# Patient Record
Sex: Male | Born: 1945 | Race: White | Hispanic: No | Marital: Married | Smoking: Never smoker
Health system: Southern US, Community
[De-identification: ages and names within clinical notes are randomized; demographics above are authoritative.]

## PROBLEM LIST (undated history)

## (undated) DIAGNOSIS — R519 Headache, unspecified: Secondary | ICD-10-CM

## (undated) DIAGNOSIS — R0600 Dyspnea, unspecified: Secondary | ICD-10-CM

## (undated) DIAGNOSIS — J189 Pneumonia, unspecified organism: Secondary | ICD-10-CM

## (undated) DIAGNOSIS — J45909 Unspecified asthma, uncomplicated: Secondary | ICD-10-CM

## (undated) DIAGNOSIS — E785 Hyperlipidemia, unspecified: Secondary | ICD-10-CM

## (undated) DIAGNOSIS — R51 Headache: Secondary | ICD-10-CM

## (undated) DIAGNOSIS — I10 Essential (primary) hypertension: Secondary | ICD-10-CM

## (undated) DIAGNOSIS — I219 Acute myocardial infarction, unspecified: Secondary | ICD-10-CM

## (undated) DIAGNOSIS — I2510 Atherosclerotic heart disease of native coronary artery without angina pectoris: Secondary | ICD-10-CM

## (undated) DIAGNOSIS — Z87442 Personal history of urinary calculi: Secondary | ICD-10-CM

## (undated) DIAGNOSIS — C801 Malignant (primary) neoplasm, unspecified: Secondary | ICD-10-CM

## (undated) DIAGNOSIS — T7840XA Allergy, unspecified, initial encounter: Secondary | ICD-10-CM

## (undated) DIAGNOSIS — M1990 Unspecified osteoarthritis, unspecified site: Secondary | ICD-10-CM

## (undated) DIAGNOSIS — G4730 Sleep apnea, unspecified: Secondary | ICD-10-CM

## (undated) DIAGNOSIS — G709 Myoneural disorder, unspecified: Secondary | ICD-10-CM

## (undated) DIAGNOSIS — J449 Chronic obstructive pulmonary disease, unspecified: Secondary | ICD-10-CM

## (undated) DIAGNOSIS — K219 Gastro-esophageal reflux disease without esophagitis: Secondary | ICD-10-CM

## (undated) DIAGNOSIS — N200 Calculus of kidney: Secondary | ICD-10-CM

## (undated) HISTORY — DX: Unspecified asthma, uncomplicated: J45.909

## (undated) HISTORY — PX: RECTAL SURGERY: SHX760

## (undated) HISTORY — PX: REPLACEMENT TOTAL KNEE: SUR1224

## (undated) HISTORY — DX: Gastro-esophageal reflux disease without esophagitis: K21.9

## (undated) HISTORY — PX: EYE SURGERY: SHX253

## (undated) HISTORY — DX: Essential (primary) hypertension: I10

## (undated) HISTORY — DX: Hyperlipidemia, unspecified: E78.5

## (undated) HISTORY — PX: COLONOSCOPY: SHX174

## (undated) HISTORY — DX: Allergy, unspecified, initial encounter: T78.40XA

## (undated) HISTORY — PX: CARPAL TUNNEL RELEASE: SHX101

## (undated) HISTORY — PX: TONSILLECTOMY: SUR1361

## (undated) HISTORY — DX: Sleep apnea, unspecified: G47.30

## (undated) HISTORY — DX: Calculus of kidney: N20.0

## (undated) HISTORY — DX: Unspecified osteoarthritis, unspecified site: M19.90

---
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
## 1999-02-15 ENCOUNTER — Emergency Department (HOSPITAL_COMMUNITY): Admission: EM | Admit: 1999-02-15 | Discharge: 1999-02-15 | Payer: Self-pay | Admitting: Emergency Medicine

## 1999-02-15 ENCOUNTER — Encounter: Payer: Self-pay | Admitting: Emergency Medicine

## 1999-09-30 ENCOUNTER — Emergency Department (HOSPITAL_COMMUNITY): Admission: EM | Admit: 1999-09-30 | Discharge: 1999-09-30 | Payer: Self-pay | Admitting: Emergency Medicine

## 1999-10-23 ENCOUNTER — Encounter: Payer: Self-pay | Admitting: Family Medicine

## 1999-10-23 ENCOUNTER — Encounter: Admission: RE | Admit: 1999-10-23 | Discharge: 1999-10-23 | Payer: Self-pay | Admitting: Family Medicine

## 1999-11-11 ENCOUNTER — Encounter: Admission: RE | Admit: 1999-11-11 | Discharge: 1999-11-11 | Payer: Self-pay | Admitting: Orthopedic Surgery

## 1999-11-11 ENCOUNTER — Encounter: Payer: Self-pay | Admitting: Orthopedic Surgery

## 2001-12-26 ENCOUNTER — Encounter: Payer: Self-pay | Admitting: Emergency Medicine

## 2001-12-26 ENCOUNTER — Emergency Department (HOSPITAL_COMMUNITY): Admission: EM | Admit: 2001-12-26 | Discharge: 2001-12-27 | Payer: Self-pay | Admitting: Emergency Medicine

## 2001-12-27 ENCOUNTER — Encounter: Payer: Self-pay | Admitting: Emergency Medicine

## 2002-05-29 ENCOUNTER — Encounter: Admission: RE | Admit: 2002-05-29 | Discharge: 2002-05-29 | Payer: Self-pay | Admitting: Orthopedic Surgery

## 2002-05-29 ENCOUNTER — Encounter: Payer: Self-pay | Admitting: Orthopedic Surgery

## 2003-06-19 ENCOUNTER — Encounter: Payer: Self-pay | Admitting: Emergency Medicine

## 2003-06-19 ENCOUNTER — Emergency Department (HOSPITAL_COMMUNITY): Admission: EM | Admit: 2003-06-19 | Discharge: 2003-06-19 | Payer: Self-pay | Admitting: Emergency Medicine

## 2004-08-12 ENCOUNTER — Encounter: Admission: RE | Admit: 2004-08-12 | Discharge: 2004-08-12 | Payer: Self-pay | Admitting: Orthopedic Surgery

## 2004-08-22 ENCOUNTER — Ambulatory Visit (HOSPITAL_COMMUNITY): Admission: RE | Admit: 2004-08-22 | Discharge: 2004-08-22 | Payer: Self-pay | Admitting: Orthopedic Surgery

## 2005-12-10 ENCOUNTER — Encounter: Admission: RE | Admit: 2005-12-10 | Discharge: 2005-12-10 | Payer: Self-pay

## 2006-05-01 ENCOUNTER — Inpatient Hospital Stay (HOSPITAL_COMMUNITY): Admission: EM | Admit: 2006-05-01 | Discharge: 2006-05-04 | Payer: Self-pay | Admitting: *Deleted

## 2007-01-10 ENCOUNTER — Ambulatory Visit (HOSPITAL_BASED_OUTPATIENT_CLINIC_OR_DEPARTMENT_OTHER): Admission: RE | Admit: 2007-01-10 | Discharge: 2007-01-10 | Payer: Self-pay | Admitting: Orthopedic Surgery

## 2012-09-04 DIAGNOSIS — K219 Gastro-esophageal reflux disease without esophagitis: Secondary | ICD-10-CM | POA: Insufficient documentation

## 2013-03-02 DIAGNOSIS — E039 Hypothyroidism, unspecified: Secondary | ICD-10-CM | POA: Insufficient documentation

## 2014-02-04 DIAGNOSIS — G4733 Obstructive sleep apnea (adult) (pediatric): Secondary | ICD-10-CM | POA: Insufficient documentation

## 2014-04-01 ENCOUNTER — Encounter: Payer: Self-pay | Admitting: Gastroenterology

## 2014-10-12 DIAGNOSIS — Z79899 Other long term (current) drug therapy: Secondary | ICD-10-CM | POA: Insufficient documentation

## 2014-10-12 DIAGNOSIS — I10 Essential (primary) hypertension: Secondary | ICD-10-CM | POA: Insufficient documentation

## 2014-10-12 DIAGNOSIS — R7301 Impaired fasting glucose: Secondary | ICD-10-CM | POA: Insufficient documentation

## 2014-10-12 DIAGNOSIS — N401 Enlarged prostate with lower urinary tract symptoms: Secondary | ICD-10-CM | POA: Insufficient documentation

## 2014-10-12 DIAGNOSIS — Z77090 Contact with and (suspected) exposure to asbestos: Secondary | ICD-10-CM | POA: Insufficient documentation

## 2014-11-08 ENCOUNTER — Encounter: Payer: Self-pay | Admitting: Gastroenterology

## 2014-12-10 ENCOUNTER — Encounter: Payer: Self-pay | Admitting: Gastroenterology

## 2015-01-28 ENCOUNTER — Ambulatory Visit (AMBULATORY_SURGERY_CENTER): Payer: Self-pay | Admitting: *Deleted

## 2015-01-28 VITALS — Ht 74.0 in | Wt 327.0 lb

## 2015-01-28 DIAGNOSIS — Z1211 Encounter for screening for malignant neoplasm of colon: Secondary | ICD-10-CM

## 2015-01-28 MED ORDER — MOVIPREP 100 G PO SOLR: 1.0000 | Freq: Once | ORAL | Status: DC

## 2015-01-28 NOTE — Progress Notes (Signed)
No egg or soy allergy. No anesthesia problems.  No home O2.  No diet meds.  

## 2015-02-03 ENCOUNTER — Telehealth: Payer: Self-pay | Admitting: Gastroenterology

## 2015-02-04 NOTE — Telephone Encounter (Signed)
Patient states his Movi prep is over 100 dollars and cannot afford it. Patient state that [employer] told him that they would pay for prescription if we did a PA. Told patient that it is not covered because he has not tried any alternative preps in the past. The forms asks if he has tried and failed other preps. Told patient we can switch him to another prep with Miralax or I do have a 10 dollar off voucher for Movi prep. Patient states he would rather be switched to another prep. Told him he will need new prep instructions and I can mail them to his home. Pt agreed.

## 2015-02-11 ENCOUNTER — Encounter: Payer: Self-pay | Admitting: Gastroenterology

## 2015-02-11 ENCOUNTER — Ambulatory Visit (AMBULATORY_SURGERY_CENTER): Payer: Medicare Other | Admitting: Gastroenterology

## 2015-02-11 VITALS — BP 126/70 | HR 73 | Temp 98.5°F | Resp 24 | Ht 74.0 in | Wt 327.0 lb

## 2015-02-11 DIAGNOSIS — Z1211 Encounter for screening for malignant neoplasm of colon: Secondary | ICD-10-CM

## 2015-02-11 MED ORDER — SODIUM CHLORIDE 0.9 % IV SOLN: 500.0000 mL | INTRAVENOUS | Status: DC

## 2015-02-11 NOTE — Patient Instructions (Signed)

## 2015-02-11 NOTE — Op Note (Signed)
[facility]  [address]. [place], [address]   COLONOSCOPY PROCEDURE REPORT  PATIENT: Brad Woods, Brad Woods  MR#: [id number] BIRTHDATE: 19-Nov-1946 , 50  yrs. old GENDER: male ENDOSCOPIST: Ladene Artist, MD, [facility] PROCEDURE DATE:  02/11/2015 PROCEDURE:   Colonoscopy, screening First Screening Colonoscopy - Avg.  risk and is 50 yrs.  old or older - No.  Prior Negative Screening - Now for repeat screening. N/A  History of Adenoma - Now for follow-up colonoscopy & has been > or = to 3 yrs.  N/A  Polyps Removed Today? No.  Polyps Removed Today? No.  Recommend repeat exam, <10 yrs? Polyps Removed Today? No.  Recommend repeat exam, <10 yrs? No. ASA CLASS:   Class III INDICATIONS:average risk patient for colorectal cancer. MEDICATIONS: Monitored anesthesia care, Propofol 200 mg IV, and lidocaine 40 mg IV DESCRIPTION OF PROCEDURE:   After the risks benefits and alternatives of the procedure were thoroughly explained, informed consent was obtained.  The digital rectal exam revealed no abnormalities of the rectum.   The LB [id number]  endoscope was introduced through the anus and advanced to the cecum, which was identified by both the appendix and ileocecal valve. No adverse events experienced.   The quality of the prep was excellent, using MoviPrep  The instrument was then slowly withdrawn as the colon was fully examined.    COLON FINDINGS: There was moderate diverticulosis noted in the sigmoid colon and descending colon.   The examination was otherwise normal.  Retroflexed views revealed no abnormalities. The time to cecum=2 minutes 32 seconds.  Withdrawal time=11 minutes 17 seconds. The scope was withdrawn and the procedure completed. COMPLICATIONS: There were no immediate complications.  ENDOSCOPIC IMPRESSION: 1.   Moderate diverticulosis in the sigmoid colon and descending colon 2.   The examination was otherwise normal  RECOMMENDATIONS: 1.  High fiber  diet with liberal fluid intake. 2.  You should continue to follow colorectal cancer screening guidelines for "routine risk" patients with a repeat colonoscopy in 10 years.  There is no need for routine, screening FOBT (stool) testing for at least 5 years.  eSigned:  Ladene Artist, MD, [facility] 02/11/2015 11:37 AM   cc: Joneen Boers, MD

## 2015-02-11 NOTE — Progress Notes (Signed)
Awake stable to RR 

## 2015-02-14 ENCOUNTER — Telehealth: Payer: Self-pay | Admitting: *Deleted

## 2015-02-14 NOTE — Telephone Encounter (Signed)
No identifier, left message, follow-up  

## 2016-05-01 DIAGNOSIS — L578 Other skin changes due to chronic exposure to nonionizing radiation: Secondary | ICD-10-CM | POA: Diagnosis not present

## 2016-05-01 DIAGNOSIS — L570 Actinic keratosis: Secondary | ICD-10-CM | POA: Diagnosis not present

## 2016-05-01 DIAGNOSIS — L304 Erythema intertrigo: Secondary | ICD-10-CM | POA: Diagnosis not present

## 2016-05-18 ENCOUNTER — Inpatient Hospital Stay (HOSPITAL_BASED_OUTPATIENT_CLINIC_OR_DEPARTMENT_OTHER)
Admission: EM | Admit: 2016-05-18 | Discharge: 2016-05-22 | DRG: 247 | Disposition: A | Discharge status: Home / Self Care | Payer: PPO | Attending: Cardiovascular Disease | Admitting: Cardiovascular Disease

## 2016-05-18 ENCOUNTER — Encounter (HOSPITAL_BASED_OUTPATIENT_CLINIC_OR_DEPARTMENT_OTHER): Payer: Self-pay

## 2016-05-18 ENCOUNTER — Emergency Department (HOSPITAL_BASED_OUTPATIENT_CLINIC_OR_DEPARTMENT_OTHER): Payer: PPO

## 2016-05-18 DIAGNOSIS — I214 Non-ST elevation (NSTEMI) myocardial infarction: Secondary | ICD-10-CM | POA: Diagnosis not present

## 2016-05-18 DIAGNOSIS — I25110 Atherosclerotic heart disease of native coronary artery with unstable angina pectoris: Secondary | ICD-10-CM | POA: Diagnosis present

## 2016-05-18 DIAGNOSIS — I4510 Unspecified right bundle-branch block: Secondary | ICD-10-CM | POA: Diagnosis present

## 2016-05-18 DIAGNOSIS — I249 Acute ischemic heart disease, unspecified: Secondary | ICD-10-CM | POA: Diagnosis not present

## 2016-05-18 DIAGNOSIS — I10 Essential (primary) hypertension: Secondary | ICD-10-CM | POA: Diagnosis present

## 2016-05-18 DIAGNOSIS — Z885 Allergy status to narcotic agent status: Secondary | ICD-10-CM | POA: Diagnosis not present

## 2016-05-18 DIAGNOSIS — R739 Hyperglycemia, unspecified: Secondary | ICD-10-CM | POA: Diagnosis not present

## 2016-05-18 DIAGNOSIS — Z7951 Long term (current) use of inhaled steroids: Secondary | ICD-10-CM | POA: Diagnosis not present

## 2016-05-18 DIAGNOSIS — G4730 Sleep apnea, unspecified: Secondary | ICD-10-CM | POA: Diagnosis present

## 2016-05-18 DIAGNOSIS — I200 Unstable angina: Secondary | ICD-10-CM | POA: Diagnosis not present

## 2016-05-18 DIAGNOSIS — R778 Other specified abnormalities of plasma proteins: Secondary | ICD-10-CM

## 2016-05-18 DIAGNOSIS — Z96653 Presence of artificial knee joint, bilateral: Secondary | ICD-10-CM | POA: Diagnosis present

## 2016-05-18 DIAGNOSIS — E785 Hyperlipidemia, unspecified: Secondary | ICD-10-CM | POA: Diagnosis not present

## 2016-05-18 DIAGNOSIS — E663 Overweight: Secondary | ICD-10-CM | POA: Diagnosis not present

## 2016-05-18 DIAGNOSIS — Z6841 Body Mass Index (BMI) 40.0 and over, adult: Secondary | ICD-10-CM

## 2016-05-18 DIAGNOSIS — R079 Chest pain, unspecified: Secondary | ICD-10-CM | POA: Diagnosis not present

## 2016-05-18 DIAGNOSIS — Z955 Presence of coronary angioplasty implant and graft: Secondary | ICD-10-CM

## 2016-05-18 DIAGNOSIS — I2510 Atherosclerotic heart disease of native coronary artery without angina pectoris: Secondary | ICD-10-CM | POA: Diagnosis not present

## 2016-05-18 DIAGNOSIS — R7989 Other specified abnormal findings of blood chemistry: Secondary | ICD-10-CM | POA: Insufficient documentation

## 2016-05-18 LAB — CBC
HEMATOCRIT: 46.4 % (ref 39.0–52.0)
HEMOGLOBIN: 16.3 g/dL (ref 13.0–17.0)
MCH: 30.2 pg (ref 26.0–34.0)
MCHC: 35.1 g/dL (ref 30.0–36.0)
MCV: 86.1 fL (ref 78.0–100.0)
Platelets: 168 10*3/uL (ref 150–400)
RBC: 5.39 MIL/uL (ref 4.22–5.81)
RDW: 14.3 % (ref 11.5–15.5)
WBC: 7 10*3/uL (ref 4.0–10.5)

## 2016-05-18 LAB — BASIC METABOLIC PANEL
ANION GAP: 8 (ref 5–15)
BUN: 22 mg/dL — ABNORMAL HIGH (ref 6–20)
CHLORIDE: 101 mmol/L (ref 101–111)
CO2: 25 mmol/L (ref 22–32)
Calcium: 9.1 mg/dL (ref 8.9–10.3)
Creatinine, Ser: 0.82 mg/dL (ref 0.61–1.24)
GFR calc non Af Amer: >60 mL/min (ref >60)
GLUCOSE: 99 mg/dL (ref 65–99)
Potassium: 4.1 mmol/L (ref 3.5–5.1)
Sodium: 134 mmol/L — ABNORMAL LOW (ref 135–145)

## 2016-05-18 LAB — PROTIME-INR
INR: 1.03 (ref 0.00–1.49)
Prothrombin Time: 13.7 seconds (ref 11.6–15.2)

## 2016-05-18 LAB — TROPONIN I
Troponin I: 0.09 ng/mL — ABNORMAL HIGH (ref <0.031)
Troponin I: 0.34 ng/mL — ABNORMAL HIGH (ref <0.031)

## 2016-05-18 LAB — APTT: APTT: 38 s — AB (ref 24–37)

## 2016-05-18 LAB — MAGNESIUM: Magnesium: 2.3 mg/dL (ref 1.7–2.4)

## 2016-05-18 MED ORDER — ONDANSETRON HCL 4 MG/2ML IJ SOLN: 4.0000 mg | Freq: Four times a day (QID) | INTRAMUSCULAR | Status: DC | PRN

## 2016-05-18 MED ORDER — HEPARIN (PORCINE) IN NACL 100-0.45 UNIT/ML-% IJ SOLN
1900.0000 [IU]/h | INTRAMUSCULAR | Status: DC
  Administered 2016-05-18 (×2): 1500 [IU]/h via INTRAVENOUS
  Administered 2016-05-19: 1600 [IU]/h via INTRAVENOUS
  Filled 2016-05-18 (×3): qty 250

## 2016-05-18 MED ORDER — HEPARIN BOLUS VIA INFUSION
4000.0000 [IU] | Freq: Once | INTRAVENOUS | Status: AC
  Administered 2016-05-18: 4000 [IU] via INTRAVENOUS

## 2016-05-18 MED ORDER — GI COCKTAIL ~~LOC~~
30.0000 mL | Freq: Once | ORAL | Status: AC
  Administered 2016-05-18: 30 mL via ORAL
  Filled 2016-05-18: qty 30

## 2016-05-18 MED ORDER — ACETAMINOPHEN 325 MG PO TABS
650.0000 mg | ORAL_TABLET | ORAL | Status: DC | PRN
  Administered 2016-05-19 (×2): 650 mg via ORAL
  Filled 2016-05-18 (×2): qty 2

## 2016-05-18 MED ORDER — ASPIRIN EC 81 MG PO TBEC
81.0000 mg | DELAYED_RELEASE_TABLET | Freq: Every day | ORAL | Status: DC
  Administered 2016-05-19 – 2016-05-22 (×5): 81 mg via ORAL
  Filled 2016-05-18 (×5): qty 1

## 2016-05-18 MED ORDER — NITROGLYCERIN 2 % TD OINT
1.0000 [in_us] | TOPICAL_OINTMENT | Freq: Once | TRANSDERMAL | Status: AC
  Administered 2016-05-18: 1 [in_us] via TOPICAL
  Filled 2016-05-18: qty 1

## 2016-05-18 MED ORDER — ASPIRIN 81 MG PO CHEW
324.0000 mg | CHEWABLE_TABLET | Freq: Once | ORAL | Status: AC
Start: 2016-05-18 — End: 2016-05-18
  Administered 2016-05-18: 324 mg via ORAL
  Filled 2016-05-18: qty 4

## 2016-05-18 MED ORDER — NITROGLYCERIN 0.4 MG SL SUBL
0.4000 mg | SUBLINGUAL_TABLET | SUBLINGUAL | Status: DC | PRN
Start: 2016-05-18 — End: 2016-05-22
  Administered 2016-05-18: 0.4 mg via SUBLINGUAL
  Filled 2016-05-18 (×2): qty 1

## 2016-05-18 NOTE — ED Notes (Signed)
Pt has seen cardiology group about five to six years ago. Pt is planing to travel to [place] in three weeks.

## 2016-05-18 NOTE — H&P (Signed)
Referring Physician:  [name] is an 70 y.o. male.                       Chief Complaint: Chest pain  HPI: 70 y.o. male who presents to the Emergency Department complaining of intermittent chest pain that radiates to the left side of his neck and jaw onset last night. He states that he is having associated symptoms of indigestion, and chest pain. He states that he has tried ASA around 8am with no relief for his symptoms. Pt mentions he's had similar symptoms a year ago while he was in [place]. Pt mentions he's been belching frequently without relief of his chest pain.   Past Medical History  Diagnosis Date  . Allergy   . Arthritis   . Asthma     when younger  . GERD (gastroesophageal reflux disease)   . Sleep apnea     cpap  . Hyperlipidemia   . Hypertension   . Kidney stones       Past Surgical History  Procedure Laterality Date  . Colonoscopy    . Rectal surgery    . Eye surgery    . Replacement total knee Bilateral     Family History  Problem Relation Age of Onset  . Colon cancer Mother 29   Social History:  reports that he has never smoked. He has never used smokeless tobacco. He reports that he drinks alcohol. He reports that he does not use illicit drugs.  Allergies:  Allergies  Allergen Reactions  . Hydromorphone Hcl Other (See Comments)    "flatlined"    Medications Prior to Admission  Medication Sig Dispense Refill  . fluticasone (FLONASE) 50 MCG/ACT nasal spray Place 2 sprays into the nose.    . nitroGLYCERIN (NITROSTAT) 0.4 MG SL tablet Place 0.4 mg under the tongue.    . tamsulosin (FLOMAX) 0.4 MG CAPS capsule TAKE ONE CAPSULE BY MOUTH EVERY DAY      Results for orders placed or performed during the hospital encounter of 05/18/16 (from the past 48 hour(s))  Basic metabolic panel     Status: Abnormal   Collection Time: 05/18/16  5:45 PM  Result Value Ref Range   Sodium 134 (L) 135 - 145 mmol/L   Potassium 4.1 3.5 - 5.1 mmol/L   Chloride 101  101 - 111 mmol/L   CO2 25 22 - 32 mmol/L   Glucose, Bld 99 65 - 99 mg/dL   BUN 22 (H) 6 - 20 mg/dL   Creatinine, Ser 0.82 0.61 - 1.24 mg/dL   Calcium 9.1 8.9 - 10.3 mg/dL   GFR calc non Af Amer >60 >60 mL/min   GFR calc Af Amer >60 >60 mL/min    Comment: (NOTE) The eGFR has been calculated using the CKD EPI equation. This calculation has not been validated in all clinical situations. eGFR's persistently <60 mL/min signify possible Chronic Kidney Disease.    Anion gap 8 5 - 15  CBC     Status: None   Collection Time: 05/18/16  5:45 PM  Result Value Ref Range   WBC 7.0 4.0 - 10.5 K/uL   RBC 5.39 4.22 - 5.81 MIL/uL   Hemoglobin 16.3 13.0 - 17.0 g/dL   HCT 46.4 39.0 - 52.0 %   MCV 86.1 78.0 - 100.0 fL   MCH 30.2 26.0 - 34.0 pg   MCHC 35.1 30.0 - 36.0 g/dL   RDW 14.3 11.5 - 15.5 %  Platelets 168 150 - 400 K/uL  Troponin I     Status: Abnormal   Collection Time: 05/18/16  5:45 PM  Result Value Ref Range   Troponin I 0.09 (H) <0.031 ng/mL    Comment:        PERSISTENTLY INCREASED TROPONIN VALUES IN THE RANGE OF 0.04-0.49 ng/mL CAN BE SEEN IN:       -UNSTABLE ANGINA       -CONGESTIVE HEART FAILURE       -MYOCARDITIS       -CHEST TRAUMA       -ARRYHTHMIAS       -LATE PRESENTING MYOCARDIAL INFARCTION       -COPD   CLINICAL FOLLOW-UP RECOMMENDED.   Magnesium     Status: None   Collection Time: 05/18/16  5:45 PM  Result Value Ref Range   Magnesium 2.3 1.7 - 2.4 mg/dL  Protime-INR     Status: None   Collection Time: 05/18/16  5:45 PM  Result Value Ref Range   Prothrombin Time 13.7 11.6 - 15.2 seconds   INR 1.03 0.00 - 1.49  APTT     Status: Abnormal   Collection Time: 05/18/16  5:45 PM  Result Value Ref Range   aPTT 38 (H) 24 - 37 seconds    Comment:        IF BASELINE aPTT IS ELEVATED, SUGGEST PATIENT RISK ASSESSMENT BE USED TO DETERMINE APPROPRIATE ANTICOAGULANT THERAPY.    Dg Chest 2 View  05/18/2016  CLINICAL DATA:  Chest pain EXAM: CHEST  2 VIEW COMPARISON:   05/01/2006 FINDINGS: The heart size and mediastinal contours are within normal limits. Both lungs are clear. The visualized skeletal structures are unremarkable. IMPRESSION: No active cardiopulmonary disease. Electronically Signed   By: Inez Catalina M.D.   On: 05/18/2016 18:50    Review Of Systems Cardiovascular: Positive for chest pain.  Musculoskeletal: Positive for neck pain.  All other systems reviewed and are negative.  Blood pressure 136/78, pulse 72, temperature 97.7 F (36.5 C), temperature source Oral, resp. rate 18, height _0  (1.88 m), weight 141.976 kg (313 lb), SpO2 99 %. Physical Exam  Constitutional: He appears well-developed and over-nourished. No distress.  HENT: Head: Normocephalic and atraumatic. Eyes: Conjunctivae are normal. Sclera-non-icteric, wears glasses. Neck: Neck supple.  Cardiovascular: Normal rate, regular rhythm and normal heart sounds. Exam reveals no gallop and no friction rub. No murmur heard. Pulmonary/Chest: Effort normal and breath sounds normal. No respiratory distress.   Musculoskeletal: He exhibits trace edema.  Neurological: He is alert. Moves all four extremities. Skin: Skin is warm and dry.  Psychiatric: He has a normal mood and affect. His behavior is normal.  Nursing note and vitals reviewed.  Assessment/Plan Acute coronary syndrome Hypertension Hyperlipidemia Morbid obesity  Admit/IV heparin R/O MI   [facility] S, MD  05/18/2016, 9:48 PM

## 2016-05-18 NOTE — ED Notes (Signed)
CP stared last night-pain radiates to left jaw and throat-increase belching-NAD-steady gait-sent from urgent care

## 2016-05-18 NOTE — Progress Notes (Signed)
ANTICOAGULATION CONSULT NOTE - Initial Consult  Pharmacy Consult for heparin Indication: chest pain/ACS  Allergies  Allergen Reactions  . Hydromorphone Hcl Other (See Comments)    "flatlined"    Patient Measurements: Height: 6\' 2"  (188 cm) Weight: (!) 313 lb (141.976 kg) IBW/kg (Calculated) : 82.2  Vital Signs: Temp: 97.7 F (36.5 C) (05/26 1724) Temp Source: Oral (05/26 1724) BP: 124/77 mmHg (05/26 1915) Pulse Rate: 86 (05/26 1915)  Labs:  Recent Labs  05/18/16 1745  HGB 16.3  HCT 46.4  PLT 168  APTT 38*  LABPROT 13.7  INR 1.03  CREATININE 0.82  TROPONINI 0.09*    Estimated Creatinine Clearance: 127.6 mL/min (by C-G formula based on Cr of 0.82).   Medical History: Past Medical History  Diagnosis Date  . Allergy   . Arthritis   . Asthma     when younger  . GERD (gastroesophageal reflux disease)   . Sleep apnea     cpap  . Hyperlipidemia   . Hypertension   . Kidney stones     Medications:  Infusions:  . heparin     Assessment: 89 yoM admitted [facility] with CP. Pharmacy consulted to dose heparin to r/o ACS. No anticoagulation PTA and CBC stable.    Goal of Therapy:  Heparin level 0.3-0.7 units/ml Monitor platelets by anticoagulation protocol: Yes   Plan:  Give 4000 units bolus x 1 Start heparin infusion at 1500 units/hr Check anti-Xa level in 6 hours and daily while on heparin Continue to monitor H&H and platelets  Duayne Cal 05/18/2016,7:27 PM

## 2016-05-18 NOTE — ED Provider Notes (Signed)
CSN: [id number]     Arrival date & time 05/18/16  1714 History  By signing my name below, I, Mesha Guinyard, attest that this documentation has been prepared under the direction and in the presence of Treatment Team:  Attending Provider: Lyndal Pulley, MD.  Electronically Signed: Arvilla Market, Medical Scribe. 05/18/2016. 6:57 PM.  Chief Complaint  Patient presents with  . Chest Pain   The history is provided by the patient. No language interpreter was used.   HPI Comments: Brad Woods is a 70 y.o. male who presents to the Emergency Department complaining of intermittent chest pain that radiates to the left side of his neck and jaw onset last night.  He states that he is having associated symptoms of indigestion, and chest pain. He states that he has tried ASA around 8am with no relief for his symptoms. Pt mentions he's had similar symptoms a year ago while he was in [place]. Pt mentions he's been belching frequently without relief of his chest pain. Pt mentions he's been having leg cramps onset a month. Pt had a stress test 3 years ago.  Pt never had a catheterization in his heart.He denies pain that radiates to his arm.  Past Medical History  Diagnosis Date  . Allergy   . Arthritis   . Asthma     when younger  . GERD (gastroesophageal reflux disease)   . Sleep apnea     cpap  . Hyperlipidemia   . Hypertension   . Kidney stones    Past Surgical History  Procedure Laterality Date  . Colonoscopy    . Rectal surgery    . Eye surgery    . Replacement total knee Bilateral    Family History  Problem Relation Age of Onset  . Colon cancer Mother 52   Social History  Substance Use Topics  . Smoking status: Never Smoker   . Smokeless tobacco: Never Used  . Alcohol Use: 0.0 oz/week    0 Standard drinks or equivalent per week     Comment: occasionally    Review of Systems  Cardiovascular: Positive for chest pain.  Musculoskeletal: Positive for neck pain.  All other systems  reviewed and are negative.  Allergies  Hydromorphone hcl  Home Medications   Prior to Admission medications   Medication Sig Start Date End Date Taking? Authorizing Provider  fluticasone (FLONASE) 50 MCG/ACT nasal spray Place 2 sprays into the nose. 12/07/13   Historical Provider, MD  nitroGLYCERIN (NITROSTAT) 0.4 MG SL tablet Place 0.4 mg under the tongue. 03/02/14 03/02/15  Historical Provider, MD  tamsulosin (FLOMAX) 0.4 MG CAPS capsule TAKE ONE CAPSULE BY MOUTH EVERY DAY 06/10/14   Historical Provider, MD   BP 168/91 mmHg  Pulse 73  Temp(Src) 97.7 F (36.5 C) (Oral)  Resp 14  Ht  (1.88 m)  Wt 313 lb (141.976 kg)  BMI 40.17 kg/m2  SpO2 97% Physical Exam  Constitutional: He is oriented to person, place, and time. He appears well-developed and well-nourished. No distress.  HENT:  Head: Normocephalic and atraumatic.  Eyes: Conjunctivae are normal.  Neck: Neck supple. No tracheal deviation present.  Cardiovascular: Normal rate, regular rhythm and normal heart sounds.  Exam reveals no gallop and no friction rub.   No murmur heard. Pulmonary/Chest: Effort normal and breath sounds normal. No respiratory distress. He has no wheezes. He has no rales. He exhibits no tenderness.  Abdominal: Soft. He exhibits no distension.  Musculoskeletal: He exhibits no edema.  Neurological: He  is alert and oriented to person, place, and time.  Skin: Skin is warm and dry.  Psychiatric: He has a normal mood and affect. His behavior is normal.  Nursing note and vitals reviewed.   ED Course  Procedures   CRITICAL CARE Performed by: Leo Grosser Total critical care time: 30 minutes Critical care time was exclusive of separately billable procedures and treating other patients. Critical care was necessary to treat or prevent imminent or life-threatening deterioration. Critical care was time spent personally by me on the following activities: development of treatment plan with patient and/or  surrogate as well as nursing, discussions with consultants, evaluation of patient's response to treatment, examination of patient, obtaining history from patient or surrogate, ordering and performing treatments and interventions, ordering and review of laboratory studies, ordering and review of radiographic studies, pulse oximetry and re-evaluation of patient's condition.  DIAGNOSTIC STUDIES: Oxygen Saturation is 97% on RA, NL by my interpretation.    COORDINATION OF CARE: 7:08 PM Discussed treatment plan with pt at bedside and pt agreed to plan.  Labs Review Labs Reviewed  BASIC METABOLIC PANEL - Abnormal; Notable for the following:    Sodium 134 (*)    BUN 22 (*)    All other components within normal limits  TROPONIN I - Abnormal; Notable for the following:    Troponin I 0.09 (*)    All other components within normal limits  APTT - Abnormal; Notable for the following:    aPTT 38 (*)    All other components within normal limits  CBC  MAGNESIUM  PROTIME-INR   Imaging Review Dg Chest 2 View  05/18/2016  CLINICAL DATA:  Chest pain EXAM: CHEST  2 VIEW COMPARISON:  05/01/2006 FINDINGS: The heart size and mediastinal contours are within normal limits. Both lungs are clear. The visualized skeletal structures are unremarkable. IMPRESSION: No active cardiopulmonary disease. Electronically Signed   By: Inez Catalina M.D.   On: 05/18/2016 18:50   I have personally reviewed and evaluated these images and lab results as part of my medical decision-making.   EKG Interpretation   Date/Time:  Friday May 18 2016 17:23:33 EDT Ventricular Rate:  71 PR Interval:  162 QRS Duration: 144 QT Interval:  448 QTC Calculation: 486 R Axis:   21 Text Interpretation:  Normal sinus rhythm Right bundle branch block qrs  wider than previous with similar morphology Otherwise no significant  change Confirmed by Dahmir Epperly MD, Quillian Quince [id number]) on 05/18/2016 5:32:04 PM      MDM   Final diagnoses:  Unstable angina  (HCC)  Elevated troponin  Acute coronary syndrome [facility])   70 y.o. male presents with Chest pain since last night that has been on and off with radiation to left jaw with notable increase in gaseousness. He does have a completed right bundle branch block on his EKG and symptoms are persistent waxing and waning concerning for unstable angina. He does have a positive troponin making his heart score 8. Provided a full dose of aspirin and pain relieved with nitroglycerin so paste applied. Heparinized and transferred to cardiac care Center after consultation with Dr. Doylene Canard who accepted the patient to stepdown for ACS management.  I personally performed the services described in this documentation, which was scribed in my presence. The recorded information has been reviewed and is accurate.    Leo Grosser, MD 05/19/16 [phone]

## 2016-05-19 ENCOUNTER — Inpatient Hospital Stay (HOSPITAL_COMMUNITY): Payer: PPO

## 2016-05-19 DIAGNOSIS — I4510 Unspecified right bundle-branch block: Secondary | ICD-10-CM | POA: Diagnosis not present

## 2016-05-19 DIAGNOSIS — Z6841 Body Mass Index (BMI) 40.0 and over, adult: Secondary | ICD-10-CM | POA: Diagnosis not present

## 2016-05-19 DIAGNOSIS — I10 Essential (primary) hypertension: Secondary | ICD-10-CM | POA: Diagnosis not present

## 2016-05-19 DIAGNOSIS — I214 Non-ST elevation (NSTEMI) myocardial infarction: Secondary | ICD-10-CM | POA: Diagnosis not present

## 2016-05-19 LAB — TROPONIN I
Troponin I: 0.98 ng/mL (ref <0.031)
Troponin I: 0.86 ng/mL (ref <0.031)

## 2016-05-19 LAB — BASIC METABOLIC PANEL
ANION GAP: 7 (ref 5–15)
BUN: 17 mg/dL (ref 6–20)
CHLORIDE: 101 mmol/L (ref 101–111)
CO2: 27 mmol/L (ref 22–32)
Calcium: 9 mg/dL (ref 8.9–10.3)
Creatinine, Ser: 0.92 mg/dL (ref 0.61–1.24)
GFR calc Af Amer: >60 mL/min (ref >60)
GLUCOSE: 116 mg/dL — AB (ref 65–99)
POTASSIUM: 4 mmol/L (ref 3.5–5.1)
Sodium: 135 mmol/L (ref 135–145)

## 2016-05-19 LAB — CBC
HEMATOCRIT: 44.2 % (ref 39.0–52.0)
HEMOGLOBIN: 14.8 g/dL (ref 13.0–17.0)
MCH: 28.9 pg (ref 26.0–34.0)
MCHC: 33.5 g/dL (ref 30.0–36.0)
MCV: 86.3 fL (ref 78.0–100.0)
PLATELETS: 155 10*3/uL (ref 150–400)
RBC: 5.12 MIL/uL (ref 4.22–5.81)
RDW: 14.1 % (ref 11.5–15.5)
WBC: 6.5 10*3/uL (ref 4.0–10.5)

## 2016-05-19 LAB — LIPID PANEL
CHOL/HDL RATIO: 5.9 ratio
Cholesterol: 183 mg/dL (ref 0–200)
HDL: 31 mg/dL — AB (ref >40)
LDL CALC: 124 mg/dL — AB (ref 0–99)
Triglycerides: 141 mg/dL (ref <150)
VLDL: 28 mg/dL (ref 0–40)

## 2016-05-19 LAB — PROTIME-INR
INR: 1.19 (ref 0.00–1.49)
PROTHROMBIN TIME: 15.3 s — AB (ref 11.6–15.2)

## 2016-05-19 LAB — HEPARIN LEVEL (UNFRACTIONATED)
HEPARIN UNFRACTIONATED: 0.32 [IU]/mL (ref 0.30–0.70)
Heparin Unfractionated: 0.28 IU/mL — ABNORMAL LOW (ref 0.30–0.70)
Heparin Unfractionated: 0.39 IU/mL (ref 0.30–0.70)

## 2016-05-19 LAB — ECHOCARDIOGRAM COMPLETE
HEIGHTINCHES: 74 in
Weight: 4931.2 oz

## 2016-05-19 MED ORDER — NITROGLYCERIN IN D5W 200-5 MCG/ML-% IV SOLN
INTRAVENOUS | Status: AC
  Administered 2016-05-19: 10 ug/min via INTRAVENOUS
  Filled 2016-05-19: qty 250

## 2016-05-19 MED ORDER — CETYLPYRIDINIUM CHLORIDE 0.05 % MT LIQD
7.0000 mL | Freq: Two times a day (BID) | OROMUCOSAL | Status: DC
  Administered 2016-05-19 – 2016-05-22 (×6): 7 mL via OROMUCOSAL

## 2016-05-19 MED ORDER — NITROGLYCERIN IN D5W 200-5 MCG/ML-% IV SOLN
0.0000 ug/min | INTRAVENOUS | Status: DC
  Administered 2016-05-19: 10 ug/min via INTRAVENOUS

## 2016-05-19 MED ORDER — ATORVASTATIN CALCIUM 80 MG PO TABS
80.0000 mg | ORAL_TABLET | Freq: Every day | ORAL | Status: DC
  Administered 2016-05-19 – 2016-05-21 (×3): 80 mg via ORAL
  Filled 2016-05-19 (×3): qty 1

## 2016-05-19 MED ORDER — GI COCKTAIL ~~LOC~~
30.0000 mL | Freq: Once | ORAL | Status: AC
  Administered 2016-05-19: 30 mL via ORAL
  Filled 2016-05-19: qty 30

## 2016-05-19 MED ORDER — FINASTERIDE 5 MG PO TABS
5.0000 mg | ORAL_TABLET | Freq: Every day | ORAL | Status: DC
  Administered 2016-05-19 – 2016-05-22 (×4): 5 mg via ORAL
  Filled 2016-05-19 (×4): qty 1

## 2016-05-19 MED ORDER — MORPHINE SULFATE (PF) 2 MG/ML IV SOLN
2.0000 mg | Freq: Once | INTRAVENOUS | Status: AC
  Administered 2016-05-20: 2 mg via INTRAVENOUS
  Filled 2016-05-19 (×2): qty 1

## 2016-05-19 MED ORDER — CARVEDILOL 3.125 MG PO TABS
3.1250 mg | ORAL_TABLET | Freq: Two times a day (BID) | ORAL | Status: DC
  Administered 2016-05-19 – 2016-05-22 (×5): 3.125 mg via ORAL
  Filled 2016-05-19 (×5): qty 1

## 2016-05-19 NOTE — Progress Notes (Signed)
[place] for heparin Indication: chest pain/ACS  Allergies  Allergen Reactions  . Hydromorphone Hcl Other (See Comments)    "flatlined"    Patient Measurements: Height: 6\' 2"  (188 cm) Weight: (!) 308 lb 3.2 oz (139.799 kg) IBW/kg (Calculated) : 82.2  Vital Signs: Temp: 97.8 F (36.6 C) (05/27 0430) BP: 141/68 mmHg (05/27 0430) Pulse Rate: 73 (05/27 0430)  Labs:  Recent Labs  05/18/16 1745 05/18/16 2306 05/19/16 0512 05/19/16 0513 05/19/16 0516  HGB 16.3  --  14.8  --   --   HCT 46.4  --  44.2  --   --   PLT 168  --  155  --   --   APTT 38*  --   --   --   --   LABPROT 13.7  --   --  15.3*  --   INR 1.03  --   --  1.19  --   HEPARINUNFRC  --   --   --   --  0.28*  CREATININE 0.82  --   --   --   --   TROPONINI 0.09* 0.34*  --   --   --     Estimated Creatinine Clearance: 126.5 mL/min (by C-G formula based on Cr of 0.82).   Medical History: Past Medical History  Diagnosis Date  . Allergy   . Arthritis   . Asthma     when younger  . GERD (gastroesophageal reflux disease)   . Sleep apnea     cpap  . Hyperlipidemia   . Hypertension   . Kidney stones     Medications:  Infusions:  . heparin 1,500 Units/hr (05/18/16 1936)   Assessment: 80 yoM admitted [facility] with CP. Pharmacy consulted to dose heparin to r/o ACS. No anticoagulation PTA and CBC stable.   Initial HL is subtherapeutic at 0.28 on heparin 1500 units/hr. Nurse reports no issues with infusion or bleeding though patient has increased CP.   Goal of Therapy:  Heparin level 0.3-0.7 units/ml Monitor platelets by anticoagulation protocol: Yes   Plan:  Increase heparin to 1600 units/hr Check anti-Xa level in 6 hours and daily while on heparin Continue to monitor H&H and platelets  Andrey Cota. Diona Foley, PharmD, [facility] Clinical Pharmacist Pager [phone] 05/19/2016,6:14 AM

## 2016-05-19 NOTE — Progress Notes (Signed)
Echocardiogram 2D Echocardiogram has been performed.  Joelene Millin 05/19/2016, 2:29 PM

## 2016-05-19 NOTE — Progress Notes (Signed)
RefJoneen Boers, MD   Subjective:  Some chest discomfort. Minimally elevated Troponin-I. Normal LV systolic function on echocardiogram.  Objective:  Vital Signs in the last 24 hours: Temp:  [97.7 F (36.5 C)-98.2 F (36.8 C)] 98.2 F (36.8 C) (05/27 1153) Pulse Rate:  [68-86] 68 (05/27 1153) Cardiac Rhythm:  [-] Normal sinus rhythm (05/27 0800) Resp:  [10-24] 18 (05/27 1153) BP: (124-168)/(68-91) 131/69 mmHg (05/27 1153) SpO2:  [95 %-99 %] 96 % (05/27 1153) FiO2 (%):  [0 %] 0 % (05/26 2133) Weight:  [139.799 kg (308 lb 3.2 oz)-141.976 kg (313 lb)] 139.799 kg (308 lb 3.2 oz) (05/27 0430)  Physical Exam: BP Readings from Last 1 Encounters:  05/19/16 131/69    Wt Readings from Last 1 Encounters:  05/19/16 139.799 kg (308 lb 3.2 oz)    Weight change:   HEENT: [place]/AT, Eyes-PERL, EOMI, Conjunctiva-Pink, Sclera-Non-icteric Neck: No JVD, No bruit, Trachea midline. Lungs:  Clear, Bilateral. Cardiac:  Regular rhythm, normal S1 and S2, no S3.  Abdomen:  Soft, non-tender. Extremities:  No edema present. No cyanosis. No clubbing. CNS: AxOx3, Cranial nerves grossly intact, moves all 4 extremities.  Skin: Warm and dry.   Intake/Output from previous day: 05/26 0701 - 05/27 0700 In: 240 [P.O.:240] Out: 600 [Urine:600]    Lab Results: BMET    Component Value Date/Time   NA 135 05/19/2016 0513   NA 134* 05/18/2016 1745   K 4.0 05/19/2016 0513   K 4.1 05/18/2016 1745   CL 101 05/19/2016 0513   CL 101 05/18/2016 1745   CO2 27 05/19/2016 0513   CO2 25 05/18/2016 1745   GLUCOSE 116* 05/19/2016 0513   GLUCOSE 99 05/18/2016 1745   BUN 17 05/19/2016 0513   BUN 22* 05/18/2016 1745   CREATININE 0.92 05/19/2016 0513   CREATININE 0.82 05/18/2016 1745   CALCIUM 9.0 05/19/2016 0513   CALCIUM 9.1 05/18/2016 1745   GFRNONAA >60 05/19/2016 0513   GFRNONAA >60 05/18/2016 1745   GFRAA >60 05/19/2016 0513   GFRAA >60 05/18/2016 1745   CBC    Component Value Date/Time   WBC 6.5  05/19/2016 0512   RBC 5.12 05/19/2016 0512   HGB 14.8 05/19/2016 0512   HCT 44.2 05/19/2016 0512   PLT 155 05/19/2016 0512   MCV 86.3 05/19/2016 0512   MCH 28.9 05/19/2016 0512   MCHC 33.5 05/19/2016 0512   RDW 14.1 05/19/2016 0512   HEPATIC Function Panel No results for input(s): PROT in the last 8760 hours.  Invalid input(s):  ALBUMIN,  AST,  ALT,  ALKPHOS,  BILIDIR,  IBILI HEMOGLOBIN A1C No components found for: HGA1C,  MPG CARDIAC ENZYMES Lab Results  Component Value Date   TROPONINI 0.86* 05/19/2016   TROPONINI 0.98* 05/19/2016   TROPONINI 0.34* 05/18/2016   BNP No results for input(s): PROBNP in the last 8760 hours. TSH No results for input(s): TSH in the last 8760 hours. CHOLESTEROL  Recent Labs  05/19/16 0514  CHOL 183    Scheduled Meds: . antiseptic oral rinse  7 mL Mouth Rinse BID  . aspirin EC  81 mg Oral Daily   Continuous Infusions: . heparin 1,600 Units/hr (05/19/16 1500)  . nitroGLYCERIN 10 mcg/min (05/19/16 1500)   PRN Meds:.acetaminophen, nitroGLYCERIN, ondansetron (ZOFRAN) IV  Assessment/Plan: Acute coronary syndrome Hypertension Hyperlipidemia Morbid obesity  Continue IV heparin, NTG. Agrees to cardiac cath on Tuesday due to holiday on Monday.    LOS: 1 day    Dixie Dials  MD  05/19/2016, 3:58  PM

## 2016-05-19 NOTE — Plan of Care (Signed)
Problem: Consults Goal: Chest Pain Patient Education (See Patient Education module for education specifics.) Outcome: Progressing Patient admitted with unrelieved chest pain since yesterday evening. Arrived to floor via [facility] transport from [place] with report of mild chest discomfort, mid sternal, non-radiating with no associative symptoms. Patient has nitroglycerin paste in place on left upper arm and is receiving IV heparin. Initial troponin I was 0.09.  During intake assessment, Dr. Doylene Canard arrived to floor to complete admission H&P and orders.  Vitals stable on arrival: Filed Vitals:    05/18/16 2015 05/18/16 2133 05/18/16 2147 05/18/16 2151  BP: 136/78   152/79    Pulse: 72   73    Temp:       97.9 F (36.6 C)  TempSrc:          Resp: 18   15    Height:     6\' 2"  (1.88 m)    Weight:     139.935 kg (308 lb 8 oz)    SpO2: 99% 95% 95%     Discussed initial plan of care and overnight testing with patient and family shortly after admission. Patient is sleeping comfortably at this time; will need further education per plan of care.  Continuing to monitor.

## 2016-05-19 NOTE — Progress Notes (Signed)
Pt c/o chest pain in center of chest and rated it 3/10 at 1910. BP 130/58 and sats 94% on 2L [place] and HR 70's. IV NTG increased per orders to 5.5cc/hr. At 1925 Pt continues with 3/10 CP. 12 lead EKG done with new T wave inversion in V2 and 3. Dr Doylene Canard called and orders received. At 2016 Pt states the chest pressure is gone but he still feels like he has indigestion. Pt medicated with the GI cocktail but MS held for now. BP 147/79 with sats 96% on 2L. Will continue to monitor closely.

## 2016-05-19 NOTE — Progress Notes (Signed)
ANTICOAGULATION CONSULT NOTE - Follow Up Consult  Pharmacy Consult for heparin Indication: chest pain/ACS  Allergies  Allergen Reactions  . Hydromorphone Hcl Other (See Comments)    "flatlined"    Patient Measurements: Height: 6\' 2"  (188 cm) Weight: (!) 308 lb 3.2 oz (139.799 kg) IBW/kg (Calculated) : 82.2 Heparin Dosing Weight: 113 kg  Vital Signs: Temp: 98.2 F (36.8 C) (05/27 1153) Temp Source: Oral (05/27 1153) BP: 131/69 mmHg (05/27 1153) Pulse Rate: 68 (05/27 1153)  Labs:  Recent Labs  05/18/16 1745 05/18/16 2306 05/19/16 0512 05/19/16 0513 05/19/16 0516 05/19/16 0926 05/19/16 1231  HGB 16.3  --  14.8  --   --   --   --   HCT 46.4  --  44.2  --   --   --   --   PLT 168  --  155  --   --   --   --   APTT 38*  --   --   --   --   --   --   LABPROT 13.7  --   --  15.3*  --   --   --   INR 1.03  --   --  1.19  --   --   --   HEPARINUNFRC  --   --   --   --  0.28*  --  0.39  CREATININE 0.82  --   --  0.92  --   --   --   TROPONINI 0.09* 0.34*  --  0.98*  --  0.86*  --     Estimated Creatinine Clearance: 112.8 mL/min (by C-G formula based on Cr of 0.92).  Assessment:  75 yoM admitted [facility] with CP. Pharmacy consulted to dose heparin to r/o ACS. No anticoagulation PTA and CBC stable. HL therapeutic at 0.39 after rate increase.   Goal of Therapy:  Heparin level 0.3-0.7 units/ml Monitor platelets by anticoagulation protocol: Yes   Plan:  Continue heparin gtt at 1600 units/hr Confirmatory 6hr HL Daily HL, CBC Monitor for S&S of bleed  Angela Burke, PharmD Pharmacy Resident Pager: [phone] 05/19/2016,1:45 PM

## 2016-05-19 NOTE — Progress Notes (Addendum)
CRITICAL VALUE ALERT  Critical value received:  Troponin 0.98  Date of notification:  05/19/16  Time of notification:  0614  Critical value read back:Yes.    Nurse who received alert:  Lenise Herald, RN  MD notified (1st page):  Doylene Canard  Time of first page:  0615  MD notified (2nd page):Harwani  Time of second [id number]  Responding MD:  Time MD responded:  Just before receiving critical lab value, reassessed patient's status. He stated that he had been awake since last assessment at 05:25 with upper left chest pain, rated as 1/10, non-radiating with no other associated symptoms.  Currently receiving IV heparin which is due to be increased per pharmacy protocol for borderline low therapeutic level.  Awaiting MD return call and will request orders for IV nitroglycerin. No rate/rhythm changes noted on telemetry.  Vital signs this AM congruous with previously obtained data.  Continuing to monitor closely.  Addendum for 06:39: Chest pain has resolved without additional treatment. Awaiting MD return call.  Addendum for 06:42: MD orders received for IV NTG to start at 10 mcg/min and titrate for chest pain. Continuing to monitor closely.

## 2016-05-19 NOTE — Progress Notes (Signed)
ANTICOAGULATION CONSULT NOTE - Follow Up Consult  Pharmacy Consult for heparin Indication: chest pain/ACS  Allergies  Allergen Reactions  . Hydromorphone Hcl Other (See Comments)    "flatlined"    Patient Measurements: Height: 6\' 2"  (188 cm) Weight: (!) 308 lb 3.2 oz (139.799 kg) IBW/kg (Calculated) : 82.2 Heparin Dosing Weight: 113 kg  Vital Signs: Temp: 98.8 F (37.1 C) (05/27 1734) Temp Source: Oral (05/27 1734) BP: 130/58 mmHg (05/27 1908) Pulse Rate: 80 (05/27 1734)  Labs:  Recent Labs  05/18/16 1745 05/18/16 2306 05/19/16 0512 05/19/16 0513 05/19/16 0516 05/19/16 0926 05/19/16 1231 05/19/16 1828  HGB 16.3  --  14.8  --   --   --   --   --   HCT 46.4  --  44.2  --   --   --   --   --   PLT 168  --  155  --   --   --   --   --   APTT 38*  --   --   --   --   --   --   --   LABPROT 13.7  --   --  15.3*  --   --   --   --   INR 1.03  --   --  1.19  --   --   --   --   HEPARINUNFRC  --   --   --   --  0.28*  --  0.39 0.32  CREATININE 0.82  --   --  0.92  --   --   --   --   TROPONINI 0.09* 0.34*  --  0.98*  --  0.86*  --   --     Estimated Creatinine Clearance: 112.8 mL/min (by C-G formula based on Cr of 0.92).  Assessment:  37 yoM admitted [facility] with CP. Pharmacy consulted to dose heparin to r/o ACS. No anticoagulation PTA and CBC stable. HL therapeutic at 0.39 after rate increase. Confirmatory HL 0.32 remains in goal range.  Goal of Therapy:  Heparin level 0.3-0.7 units/ml Monitor platelets by anticoagulation protocol: Yes   Plan:  Continue heparin gtt at 1600 units/hr Daily HL, CBC  Danasha Melman S. Alford Highland, PharmD, [place] Clinical Staff Pharmacist Pager [phone]   05/19/2016,7:11 PM

## 2016-05-20 ENCOUNTER — Encounter (HOSPITAL_COMMUNITY)
Admission: EM | Disposition: A | Discharge status: Home / Self Care | Payer: Self-pay | Source: Home / Self Care | Attending: Cardiovascular Disease

## 2016-05-20 ENCOUNTER — Ambulatory Visit (HOSPITAL_COMMUNITY): Admit: 2016-05-20 | Payer: Self-pay | Admitting: Cardiovascular Disease

## 2016-05-20 DIAGNOSIS — I2510 Atherosclerotic heart disease of native coronary artery without angina pectoris: Secondary | ICD-10-CM

## 2016-05-20 DIAGNOSIS — R7989 Other specified abnormal findings of blood chemistry: Secondary | ICD-10-CM

## 2016-05-20 DIAGNOSIS — I4510 Unspecified right bundle-branch block: Secondary | ICD-10-CM | POA: Diagnosis not present

## 2016-05-20 DIAGNOSIS — Z6841 Body Mass Index (BMI) 40.0 and over, adult: Secondary | ICD-10-CM | POA: Diagnosis not present

## 2016-05-20 DIAGNOSIS — I214 Non-ST elevation (NSTEMI) myocardial infarction: Secondary | ICD-10-CM | POA: Diagnosis not present

## 2016-05-20 DIAGNOSIS — I10 Essential (primary) hypertension: Secondary | ICD-10-CM | POA: Diagnosis not present

## 2016-05-20 DIAGNOSIS — R778 Other specified abnormalities of plasma proteins: Secondary | ICD-10-CM | POA: Insufficient documentation

## 2016-05-20 HISTORY — PX: CARDIAC CATHETERIZATION: SHX172

## 2016-05-20 LAB — CBC
HCT: 46.9 % (ref 39.0–52.0)
Hemoglobin: 15.5 g/dL (ref 13.0–17.0)
MCH: 28.8 pg (ref 26.0–34.0)
MCHC: 33 g/dL (ref 30.0–36.0)
MCV: 87.2 fL (ref 78.0–100.0)
PLATELETS: 158 10*3/uL (ref 150–400)
RBC: 5.38 MIL/uL (ref 4.22–5.81)
RDW: 14.1 % (ref 11.5–15.5)
WBC: 7.2 10*3/uL (ref 4.0–10.5)

## 2016-05-20 LAB — BASIC METABOLIC PANEL
Anion gap: 8 (ref 5–15)
BUN: 12 mg/dL (ref 6–20)
CALCIUM: 9.4 mg/dL (ref 8.9–10.3)
CO2: 26 mmol/L (ref 22–32)
CREATININE: 0.9 mg/dL (ref 0.61–1.24)
Chloride: 102 mmol/L (ref 101–111)
GFR calc Af Amer: >60 mL/min (ref >60)
GLUCOSE: 121 mg/dL — AB (ref 65–99)
POTASSIUM: 4 mmol/L (ref 3.5–5.1)
Sodium: 136 mmol/L (ref 135–145)

## 2016-05-20 LAB — PROTIME-INR
INR: 1.15 (ref 0.00–1.49)
PROTHROMBIN TIME: 14.9 s (ref 11.6–15.2)

## 2016-05-20 LAB — MRSA PCR SCREENING: MRSA BY PCR: NEGATIVE

## 2016-05-20 LAB — TROPONIN I: TROPONIN I: 0.26 ng/mL — AB (ref <0.031)

## 2016-05-20 LAB — HEPARIN LEVEL (UNFRACTIONATED): HEPARIN UNFRACTIONATED: 0.21 [IU]/mL — AB (ref 0.30–0.70)

## 2016-05-20 SURGERY — LEFT HEART CATH AND CORONARY ANGIOGRAPHY

## 2016-05-20 MED ORDER — MORPHINE SULFATE (PF) 2 MG/ML IV SOLN: 2.0000 mg | INTRAVENOUS | Status: DC | PRN

## 2016-05-20 MED ORDER — ACETAMINOPHEN 325 MG PO TABS: 650.0000 mg | ORAL_TABLET | ORAL | Status: DC | PRN

## 2016-05-20 MED ORDER — SODIUM CHLORIDE 0.9 % IV SOLN
1.7500 mg/kg/h | INTRAVENOUS | Status: DC
  Administered 2016-05-20: 1.75 mg/kg/h via INTRAVENOUS
  Filled 2016-05-20 (×4): qty 250

## 2016-05-20 MED ORDER — IOPAMIDOL (ISOVUE-370) INJECTION 76%
INTRAVENOUS | Status: AC
  Filled 2016-05-20: qty 100

## 2016-05-20 MED ORDER — ONDANSETRON HCL 4 MG/2ML IJ SOLN
INTRAMUSCULAR | Status: AC
  Filled 2016-05-20: qty 2

## 2016-05-20 MED ORDER — LORAZEPAM 2 MG/ML IJ SOLN
0.5000 mg | Freq: Once | INTRAMUSCULAR | Status: AC
  Administered 2016-05-20: 0.5 mg via INTRAVENOUS
  Filled 2016-05-20: qty 1

## 2016-05-20 MED ORDER — SODIUM CHLORIDE 0.9% FLUSH: 3.0000 mL | INTRAVENOUS | Status: DC | PRN

## 2016-05-20 MED ORDER — BIVALIRUDIN 250 MG IV SOLR
INTRAVENOUS | Status: AC
  Filled 2016-05-20: qty 250

## 2016-05-20 MED ORDER — ONDANSETRON HCL 4 MG/2ML IJ SOLN
INTRAMUSCULAR | Status: DC | PRN
  Administered 2016-05-20: 4 mg via INTRAVENOUS

## 2016-05-20 MED ORDER — SODIUM CHLORIDE 0.9 % IV SOLN: 250.0000 mL | INTRAVENOUS | Status: DC | PRN

## 2016-05-20 MED ORDER — NITROGLYCERIN 1 MG/10 ML FOR IR/CATH LAB
INTRA_ARTERIAL | Status: AC
  Filled 2016-05-20: qty 10

## 2016-05-20 MED ORDER — MIDAZOLAM HCL 2 MG/2ML IJ SOLN
INTRAMUSCULAR | Status: DC | PRN
  Administered 2016-05-20: 1 mg via INTRAVENOUS

## 2016-05-20 MED ORDER — HEPARIN (PORCINE) IN NACL 2-0.9 UNIT/ML-% IJ SOLN
INTRAMUSCULAR | Status: DC | PRN
  Administered 2016-05-20: 1000 mL

## 2016-05-20 MED ORDER — VERAPAMIL HCL 2.5 MG/ML IV SOLN
INTRA_ARTERIAL | Status: DC | PRN
  Administered 2016-05-20: 5 mL via INTRA_ARTERIAL

## 2016-05-20 MED ORDER — TICAGRELOR 90 MG PO TABS
ORAL_TABLET | ORAL | Status: AC
  Filled 2016-05-20: qty 2

## 2016-05-20 MED ORDER — LIDOCAINE HCL (PF) 1 % IJ SOLN
INTRAMUSCULAR | Status: DC | PRN
  Administered 2016-05-20: 5 mL

## 2016-05-20 MED ORDER — HEPARIN (PORCINE) IN NACL 2-0.9 UNIT/ML-% IJ SOLN
INTRAMUSCULAR | Status: AC
  Filled 2016-05-20: qty 1000

## 2016-05-20 MED ORDER — MORPHINE SULFATE (PF) 2 MG/ML IV SOLN
INTRAVENOUS | Status: AC
  Filled 2016-05-20: qty 1

## 2016-05-20 MED ORDER — IOPAMIDOL (ISOVUE-370) INJECTION 76%
INTRAVENOUS | Status: AC
  Filled 2016-05-20: qty 125

## 2016-05-20 MED ORDER — IOPAMIDOL (ISOVUE-370) INJECTION 76%
INTRAVENOUS | Status: AC
  Filled 2016-05-20: qty 50

## 2016-05-20 MED ORDER — BIVALIRUDIN BOLUS VIA INFUSION - CUPID
INTRAVENOUS | Status: DC | PRN
  Administered 2016-05-20: 104.85 mg via INTRAVENOUS

## 2016-05-20 MED ORDER — TICAGRELOR 90 MG PO TABS
90.0000 mg | ORAL_TABLET | Freq: Two times a day (BID) | ORAL | Status: DC
  Administered 2016-05-20 – 2016-05-22 (×4): 90 mg via ORAL
  Filled 2016-05-20 (×4): qty 1

## 2016-05-20 MED ORDER — MIDAZOLAM HCL 2 MG/2ML IJ SOLN
INTRAMUSCULAR | Status: AC
  Filled 2016-05-20: qty 2

## 2016-05-20 MED ORDER — HEPARIN SODIUM (PORCINE) 1000 UNIT/ML IJ SOLN
INTRAMUSCULAR | Status: DC | PRN
  Administered 2016-05-20: 5000 [IU] via INTRAVENOUS

## 2016-05-20 MED ORDER — ONDANSETRON HCL 4 MG/2ML IJ SOLN: 4.0000 mg | Freq: Four times a day (QID) | INTRAMUSCULAR | Status: DC | PRN

## 2016-05-20 MED ORDER — MORPHINE SULFATE (PF) 2 MG/ML IV SOLN
2.0000 mg | INTRAVENOUS | Status: DC | PRN
  Administered 2016-05-20: 2 mg via INTRAVENOUS

## 2016-05-20 MED ORDER — MORPHINE SULFATE (PF) 2 MG/ML IV SOLN
2.0000 mg | Freq: Once | INTRAVENOUS | Status: AC
  Administered 2016-05-20: 2 mg via INTRAVENOUS

## 2016-05-20 MED ORDER — SODIUM CHLORIDE 0.9 % IV SOLN
250.0000 mg | INTRAVENOUS | Status: DC | PRN
  Administered 2016-05-20: 1.75 mg/kg/h via INTRAVENOUS
  Administered 2016-05-20: 250 mg

## 2016-05-20 MED ORDER — TICAGRELOR 90 MG PO TABS
ORAL_TABLET | ORAL | Status: DC | PRN
  Administered 2016-05-20: 180 mg via ORAL

## 2016-05-20 MED ORDER — FENTANYL CITRATE (PF) 100 MCG/2ML IJ SOLN
INTRAMUSCULAR | Status: AC
  Filled 2016-05-20: qty 2

## 2016-05-20 MED ORDER — SODIUM CHLORIDE 0.9 % IV SOLN
INTRAVENOUS | Status: DC
  Administered 2016-05-20: 04:00:00 via INTRAVENOUS

## 2016-05-20 MED ORDER — SODIUM CHLORIDE 0.9% FLUSH: 3.0000 mL | Freq: Two times a day (BID) | INTRAVENOUS | Status: DC

## 2016-05-20 MED ORDER — GI COCKTAIL ~~LOC~~
30.0000 mL | Freq: Once | ORAL | Status: AC
  Administered 2016-05-20: 30 mL via ORAL
  Filled 2016-05-20: qty 30

## 2016-05-20 MED ORDER — MORPHINE SULFATE (PF) 2 MG/ML IV SOLN
INTRAVENOUS | Status: AC
  Administered 2016-05-20: 2 mg via INTRAVENOUS
  Filled 2016-05-20: qty 1

## 2016-05-20 MED ORDER — HEPARIN SODIUM (PORCINE) 1000 UNIT/ML IJ SOLN
INTRAMUSCULAR | Status: AC
  Filled 2016-05-20: qty 1

## 2016-05-20 MED ORDER — LIDOCAINE HCL (PF) 1 % IJ SOLN
INTRAMUSCULAR | Status: AC
  Filled 2016-05-20: qty 30

## 2016-05-20 MED ORDER — VERAPAMIL HCL 2.5 MG/ML IV SOLN
INTRAVENOUS | Status: AC
  Filled 2016-05-20: qty 2

## 2016-05-20 MED ORDER — FENTANYL CITRATE (PF) 100 MCG/2ML IJ SOLN
INTRAMUSCULAR | Status: DC | PRN
  Administered 2016-05-20: 25 ug via INTRAVENOUS

## 2016-05-20 MED ORDER — ASPIRIN 81 MG PO CHEW: 81.0000 mg | CHEWABLE_TABLET | Freq: Every day | ORAL | Status: DC

## 2016-05-20 MED ORDER — SODIUM CHLORIDE 0.9 % IV SOLN
INTRAVENOUS | Status: AC
  Administered 2016-05-20: 09:00:00 via INTRAVENOUS

## 2016-05-20 MED ORDER — SODIUM CHLORIDE 0.9% FLUSH
3.0000 mL | Freq: Two times a day (BID) | INTRAVENOUS | Status: DC
  Administered 2016-05-20 – 2016-05-21 (×3): 3 mL via INTRAVENOUS

## 2016-05-20 MED ORDER — IOPAMIDOL (ISOVUE-370) INJECTION 76%
INTRAVENOUS | Status: DC | PRN
  Administered 2016-05-20: 225 mL via INTRA_ARTERIAL

## 2016-05-20 SURGICAL SUPPLY — 25 items
BAG SNAP BAND KOVER 36X36 (MISCELLANEOUS) ×2 IMPLANT
BALLN EMERGE MR 2.0X12 (BALLOONS) ×3
BALLN ~~LOC~~ EUPHORA RX 2.5X15 (BALLOONS) ×3
BALLOON EMERGE MR 2.0X12 (BALLOONS) IMPLANT
BALLOON ~~LOC~~ EUPHORA RX 2.5X15 (BALLOONS) IMPLANT
CATH INFINITI 5FR MULTPACK ANG (CATHETERS) ×2 IMPLANT
CATH OPTITORQUE TIG 4.0 5F (CATHETERS) ×2 IMPLANT
CATH VISTA GUIDE 6FR XB3.5 (CATHETERS) ×2 IMPLANT
CATH VISTA GUIDE 6FR XBLAD3.5 (CATHETERS) IMPLANT
COVER DOME SNAP 22 D (MISCELLANEOUS) ×2 IMPLANT
DEVICE RAD COMP TR BAND LRG (VASCULAR PRODUCTS) ×2 IMPLANT
ELECT DEFIB PAD ADLT CADENCE (PAD) ×2 IMPLANT
GLIDESHEATH SLEND A-KIT 6F 22G (SHEATH) ×2 IMPLANT
HOVERMATT SINGLE USE (MISCELLANEOUS) ×2 IMPLANT
KIT ENCORE 26 ADVANTAGE (KITS) ×2 IMPLANT
KIT HEART LEFT (KITS) ×3 IMPLANT
PACK CARDIAC CATHETERIZATION (CUSTOM PROCEDURE TRAY) ×3 IMPLANT
SHEATH PINNACLE 6F 10CM (SHEATH) IMPLANT
STENT SYNERGY DES 2.25X24 (Permanent Stent) ×2 IMPLANT
TRANSDUCER W/STOPCOCK (MISCELLANEOUS) ×3 IMPLANT
TUBING CIL FLEX 10 FLL-RA (TUBING) ×3 IMPLANT
WIRE ASAHI PROWATER 180CM (WIRE) ×2 IMPLANT
WIRE EMERALD 3MM-J .035X150CM (WIRE) IMPLANT
WIRE HI TORQ VERSACORE-J 145CM (WIRE) ×2 IMPLANT
WIRE SAFE-T 1.5MM-J .035X260CM (WIRE) ×2 IMPLANT

## 2016-05-20 NOTE — Progress Notes (Addendum)
At about 2:30 am MD up to see patient and ordered for him to have 2 mg Morphine and a GI cocktail. Pt medicated and pain better for about 10 min. MD aware of reoccurring pain. MD ordered 0.5 mg Ativan. Pt medicated. Pain better for about 15-20 min then pt states pain back at 9/10. Dr Doylene Canard notified and back up to see the patient. Plan is for patient to go to cath lab this am.

## 2016-05-20 NOTE — Interval H&P Note (Signed)
Cath Lab Visit (complete for each Cath Lab visit)  Clinical Evaluation Leading to the Procedure:   ACS: Yes.    Non-ACS:    Anginal Classification: CCS III  Anti-ischemic medical therapy: Maximal Therapy (2 or more classes of medications)  Non-Invasive Test Results: No non-invasive testing performed  Prior CABG: No previous CABG      History and Physical Interval Note:  05/20/2016 4:48 AM  Brad Woods  has presented today for surgery, with the diagnosis of Urgent Cath  The various methods of treatment have been discussed with the patient and family. After consideration of risks, benefits and other options for treatment, the patient has consented to  Procedure(s): Left Heart Cath and Coronary Angiography (N/A) as a surgical intervention .  The patient's history has been reviewed, patient examined, no change in status, stable for surgery.  I have reviewed the patient's chart and labs.  Questions were answered to the patient's satisfaction.     Quay Burow

## 2016-05-20 NOTE — H&P (View-Only) (Signed)
Brad Boers, MD   Subjective:  Recurrent chest pain not responding much to NTG or morphine or GI cocktail. Vital signs stable. EKG with sinus rhythm, right BBB and intermittent ST-T changes in anterior leads. Troponin-I minimally elevated. Fasting Blood sugar mildly elevated.   Objective:  Vital Signs in the last 24 hours: Temp:  [97.8 F (36.6 C)-98.8 F (37.1 C)] 98.4 F (36.9 C) (05/27 1937) Pulse Rate:  [68-80] 74 (05/27 1937) Cardiac Rhythm:  [-] Normal sinus rhythm;Bundle branch block (05/27 2000) Resp:  [17-20] 20 (05/27 1937) BP: (124-165)/(53-88) 124/71 mmHg (05/28 0145) SpO2:  [94 %-97 %] 96 % (05/27 1937) Weight:  [139.799 kg (308 lb 3.2 oz)] 139.799 kg (308 lb 3.2 oz) (05/27 0430)  Physical Exam: BP Readings from Last 1 Encounters:  05/20/16 124/71    Wt Readings from Last 1 Encounters:  05/19/16 139.799 kg (308 lb 3.2 oz)    Weight change:   HEENT: [place]/AT, Eyes-PERL, EOMI, Conjunctiva-Pink, Sclera-Non-icteric Neck: No JVD, No bruit, Trachea midline. Lungs:  Clear, Bilateral. Cardiac:  Regular rhythm, normal S1 and S2, no S3.  Abdomen:  Soft, non-tender. Extremities:  No edema present. No cyanosis. No clubbing. CNS: AxOx3, Cranial nerves grossly intact, moves all 4 extremities. Right handed. Skin: Warm and dry.   Intake/Output from previous day: 05/27 0701 - 05/28 0700 In: 480 [P.O.:480] Out: 1150 [Urine:1150]    Lab Results: BMET    Component Value Date/Time   NA 136 05/20/2016 0302   NA 135 05/19/2016 0513   NA 134* 05/18/2016 1745   K 4.0 05/20/2016 0302   K 4.0 05/19/2016 0513   K 4.1 05/18/2016 1745   CL 102 05/20/2016 0302   CL 101 05/19/2016 0513   CL 101 05/18/2016 1745   CO2 26 05/20/2016 0302   CO2 27 05/19/2016 0513   CO2 25 05/18/2016 1745   GLUCOSE 121* 05/20/2016 0302   GLUCOSE 116* 05/19/2016 0513   GLUCOSE 99 05/18/2016 1745   BUN 12 05/20/2016 0302   BUN 17 05/19/2016 0513   BUN 22* 05/18/2016 1745   CREATININE 0.90  05/20/2016 0302   CREATININE 0.92 05/19/2016 0513   CREATININE 0.82 05/18/2016 1745   CALCIUM 9.4 05/20/2016 0302   CALCIUM 9.0 05/19/2016 0513   CALCIUM 9.1 05/18/2016 1745   GFRNONAA >60 05/20/2016 0302   GFRNONAA >60 05/19/2016 0513   GFRNONAA >60 05/18/2016 1745   GFRAA >60 05/20/2016 0302   GFRAA >60 05/19/2016 0513   GFRAA >60 05/18/2016 1745   CBC    Component Value Date/Time   WBC 7.2 05/20/2016 0302   RBC 5.38 05/20/2016 0302   HGB 15.5 05/20/2016 0302   HCT 46.9 05/20/2016 0302   PLT 158 05/20/2016 0302   MCV 87.2 05/20/2016 0302   MCH 28.8 05/20/2016 0302   MCHC 33.0 05/20/2016 0302   RDW 14.1 05/20/2016 0302   HEPATIC Function Panel No results for input(s): PROT in the last 8760 hours.  Invalid input(s):  ALBUMIN,  AST,  ALT,  ALKPHOS,  BILIDIR,  IBILI HEMOGLOBIN A1C No components found for: HGA1C,  MPG CARDIAC ENZYMES Lab Results  Component Value Date   TROPONINI 0.26* 05/20/2016   TROPONINI 0.86* 05/19/2016   TROPONINI 0.98* 05/19/2016   BNP No results for input(s): PROBNP in the last 8760 hours. TSH No results for input(s): TSH in the last 8760 hours. CHOLESTEROL  Recent Labs  05/19/16 0514  CHOL 183    Scheduled Meds: . antiseptic oral rinse  7 mL Mouth  Rinse BID  . aspirin EC  81 mg Oral Daily  . atorvastatin  80 mg Oral q1800  . carvedilol  3.125 mg Oral BID WC  . finasteride  5 mg Oral Daily  . morphine       Continuous Infusions: . heparin 1,900 Units/hr (05/20/16 0355)  . nitroGLYCERIN 23.333 mcg/min (05/20/16 0206)   PRN Meds:.acetaminophen, nitroGLYCERIN, ondansetron (ZOFRAN) IV  Assessment/Plan: Acute coronary syndrome Hypertension Hyperlipidemia Morbid obesity  Dr. Donnella Bi and cath lab team notified. Consider emergent cardiac cath.    LOS: 2 days    Dixie Dials  MD  05/20/2016, 4:10 AM

## 2016-05-20 NOTE — Progress Notes (Signed)
At 00205 Pt called out c/o 7-8/10 chest pain. States in his chest and radiating up into his left neck. Dr Doylene Canard notified. BP 149/108 Hr 70 and NTG gtt increased to 7 ml/hr. Dr Doylene Canard to come and see patient.

## 2016-05-20 NOTE — Progress Notes (Signed)
Pt had 2 episodes of CP after returning from cath lab. MD paged after first episode which went away on its own and pt medicated with IV morphine for second episode (0612). Pt with some nausea after the morphine but no emesis. Pt transported back to cath lab at 0630.

## 2016-05-20 NOTE — Interval H&P Note (Signed)
Cath Lab Visit (complete for each Cath Lab visit)  Clinical Evaluation Leading to the Procedure:   ACS: Yes.    Non-ACS:    Anginal Classification: CCS IV  Anti-ischemic medical therapy: Maximal Therapy (2 or more classes of medications)  Non-Invasive Test Results: No non-invasive testing performed  Prior CABG: No previous CABG      History and Physical Interval Note:  05/20/2016 6:37 AM  Brad Woods  has presented today for surgery, with the diagnosis of Urgent Cath  The various methods of treatment have been discussed with the patient and family. After consideration of risks, benefits and other options for treatment, the patient has consented to  Procedure(s): Left Heart Cath and Coronary Angiography (N/A) as a surgical intervention .  The patient's history has been reviewed, patient examined, no change in status, stable for surgery.  I have reviewed the patient's chart and labs.  Questions were answered to the patient's satisfaction.     Quay Burow

## 2016-05-20 NOTE — Progress Notes (Addendum)
At 0125 Pt called and c/o chest pain and headache. Pt with NTG infusing at 5.5 ml/hr and not wanting gtt increased d/t headache. Pt given 2 mg Morphine. 12 lead EKG done without acute changes noted. At 0140 pt is pain free. Will continue to monitor.

## 2016-05-20 NOTE — Progress Notes (Signed)
ANTICOAGULATION CONSULT NOTE - Follow Up Consult  Pharmacy Consult for heparin Indication: chest pain/ACS  Allergies  Allergen Reactions  . Hydromorphone Hcl Other (See Comments)    "flatlined"    Patient Measurements: Height: 6\' 2"  (188 cm) Weight: (!) 308 lb 3.2 oz (139.799 kg) IBW/kg (Calculated) : 82.2 Heparin Dosing Weight: 113 kg  Vital Signs: Temp: 98.4 F (36.9 C) (05/27 1937) Temp Source: Oral (05/27 1734) BP: 124/71 mmHg (05/28 0145) Pulse Rate: 74 (05/27 1937)  Labs:  Recent Labs  05/18/16 1745  05/19/16 0512 05/19/16 0513  05/19/16 0926 05/19/16 1231 05/19/16 1828 05/20/16 0302  HGB 16.3  --  14.8  --   --   --   --   --  15.5  HCT 46.4  --  44.2  --   --   --   --   --  46.9  PLT 168  --  155  --   --   --   --   --  158  APTT 38*  --   --   --   --   --   --   --   --   LABPROT 13.7  --   --  15.3*  --   --   --   --  14.9  INR 1.03  --   --  1.19  --   --   --   --  1.15  HEPARINUNFRC  --   --   --   --   < >  --  0.39 0.32 0.21*  CREATININE 0.82  --   --  0.92  --   --   --   --  0.90  TROPONINI 0.09*  < >  --  0.98*  --  0.86*  --   --  0.26*  < > = values in this interval not displayed.  Estimated Creatinine Clearance: 115.3 mL/min (by C-G formula based on Cr of 0.9).  Assessment:  34 yoM admitted [facility] with CP. Pharmacy consulted to dose heparin to r/o ACS. No anticoagulation PTA and CBC stable. HL therapeutic at 0.39 after rate increase. Confirmatory HL 0.32 remains in goal range.  This morning's HL is now subtherapeutic at 0.21 on heparin 1600 units/hr. Nurse reports no issues with infusion or bleeding.  Goal of Therapy:  Heparin level 0.3-0.7 units/ml Monitor platelets by anticoagulation protocol: Yes   Plan:  Increase heparin to 1900 units/hr 6h HL Daily HL, CBC  Andrey Cota. Diona Foley, PharmD, BCPS Clinical Pharmacist Pager [phone]  05/20/2016,3:51 AM

## 2016-05-20 NOTE — Progress Notes (Signed)
RefJoneen Boers, MD   Subjective:  Recurrent chest pain not responding much to NTG or morphine or GI cocktail. Vital signs stable. EKG with sinus rhythm, right BBB and intermittent ST-T changes in anterior leads. Troponin-I minimally elevated. Fasting Blood sugar mildly elevated.   Objective:  Vital Signs in the last 24 hours: Temp:  [97.8 F (36.6 C)-98.8 F (37.1 C)] 98.4 F (36.9 C) (05/27 1937) Pulse Rate:  [68-80] 74 (05/27 1937) Cardiac Rhythm:  [-] Normal sinus rhythm;Bundle branch block (05/27 2000) Resp:  [17-20] 20 (05/27 1937) BP: (124-165)/(53-88) 124/71 mmHg (05/28 0145) SpO2:  [94 %-97 %] 96 % (05/27 1937) Weight:  [139.799 kg (308 lb 3.2 oz)] 139.799 kg (308 lb 3.2 oz) (05/27 0430)  Physical Exam: BP Readings from Last 1 Encounters:  05/20/16 124/71    Wt Readings from Last 1 Encounters:  05/19/16 139.799 kg (308 lb 3.2 oz)    Weight change:   HEENT: [place]/AT, Eyes-PERL, EOMI, Conjunctiva-Pink, Sclera-Non-icteric Neck: No JVD, No bruit, Trachea midline. Lungs:  Clear, Bilateral. Cardiac:  Regular rhythm, normal S1 and S2, no S3.  Abdomen:  Soft, non-tender. Extremities:  No edema present. No cyanosis. No clubbing. CNS: AxOx3, Cranial nerves grossly intact, moves all 4 extremities. Right handed. Skin: Warm and dry.   Intake/Output from previous day: 05/27 0701 - 05/28 0700 In: 480 [P.O.:480] Out: 1150 [Urine:1150]    Lab Results: BMET    Component Value Date/Time   NA 136 05/20/2016 0302   NA 135 05/19/2016 0513   NA 134* 05/18/2016 1745   K 4.0 05/20/2016 0302   K 4.0 05/19/2016 0513   K 4.1 05/18/2016 1745   CL 102 05/20/2016 0302   CL 101 05/19/2016 0513   CL 101 05/18/2016 1745   CO2 26 05/20/2016 0302   CO2 27 05/19/2016 0513   CO2 25 05/18/2016 1745   GLUCOSE 121* 05/20/2016 0302   GLUCOSE 116* 05/19/2016 0513   GLUCOSE 99 05/18/2016 1745   BUN 12 05/20/2016 0302   BUN 17 05/19/2016 0513   BUN 22* 05/18/2016 1745   CREATININE 0.90  05/20/2016 0302   CREATININE 0.92 05/19/2016 0513   CREATININE 0.82 05/18/2016 1745   CALCIUM 9.4 05/20/2016 0302   CALCIUM 9.0 05/19/2016 0513   CALCIUM 9.1 05/18/2016 1745   GFRNONAA >60 05/20/2016 0302   GFRNONAA >60 05/19/2016 0513   GFRNONAA >60 05/18/2016 1745   GFRAA >60 05/20/2016 0302   GFRAA >60 05/19/2016 0513   GFRAA >60 05/18/2016 1745   CBC    Component Value Date/Time   WBC 7.2 05/20/2016 0302   RBC 5.38 05/20/2016 0302   HGB 15.5 05/20/2016 0302   HCT 46.9 05/20/2016 0302   PLT 158 05/20/2016 0302   MCV 87.2 05/20/2016 0302   MCH 28.8 05/20/2016 0302   MCHC 33.0 05/20/2016 0302   RDW 14.1 05/20/2016 0302   HEPATIC Function Panel No results for input(s): PROT in the last 8760 hours.  Invalid input(s):  ALBUMIN,  AST,  ALT,  ALKPHOS,  BILIDIR,  IBILI HEMOGLOBIN A1C No components found for: HGA1C,  MPG CARDIAC ENZYMES Lab Results  Component Value Date   TROPONINI 0.26* 05/20/2016   TROPONINI 0.86* 05/19/2016   TROPONINI 0.98* 05/19/2016   BNP No results for input(s): PROBNP in the last 8760 hours. TSH No results for input(s): TSH in the last 8760 hours. CHOLESTEROL  Recent Labs  05/19/16 0514  CHOL 183    Scheduled Meds: . antiseptic oral rinse  7 mL Mouth  Rinse BID  . aspirin EC  81 mg Oral Daily  . atorvastatin  80 mg Oral q1800  . carvedilol  3.125 mg Oral BID WC  . finasteride  5 mg Oral Daily  . morphine       Continuous Infusions: . heparin 1,900 Units/hr (05/20/16 0355)  . nitroGLYCERIN 23.333 mcg/min (05/20/16 0206)   PRN Meds:.acetaminophen, nitroGLYCERIN, ondansetron (ZOFRAN) IV  Assessment/Plan: Acute coronary syndrome Hypertension Hyperlipidemia Morbid obesity  Dr. Donnella Bi and cath lab team notified. Consider emergent cardiac cath.    LOS: 2 days    Dixie Dials  MD  05/20/2016, 4:10 AM

## 2016-05-21 DIAGNOSIS — I25110 Atherosclerotic heart disease of native coronary artery with unstable angina pectoris: Secondary | ICD-10-CM | POA: Diagnosis not present

## 2016-05-21 DIAGNOSIS — I10 Essential (primary) hypertension: Secondary | ICD-10-CM | POA: Diagnosis not present

## 2016-05-21 DIAGNOSIS — I4510 Unspecified right bundle-branch block: Secondary | ICD-10-CM | POA: Diagnosis not present

## 2016-05-21 DIAGNOSIS — Z6841 Body Mass Index (BMI) 40.0 and over, adult: Secondary | ICD-10-CM | POA: Diagnosis not present

## 2016-05-21 DIAGNOSIS — E663 Overweight: Secondary | ICD-10-CM | POA: Diagnosis not present

## 2016-05-21 DIAGNOSIS — I214 Non-ST elevation (NSTEMI) myocardial infarction: Secondary | ICD-10-CM | POA: Diagnosis not present

## 2016-05-21 LAB — BASIC METABOLIC PANEL
ANION GAP: 7 (ref 5–15)
BUN: 11 mg/dL (ref 6–20)
CALCIUM: 9 mg/dL (ref 8.9–10.3)
CHLORIDE: 103 mmol/L (ref 101–111)
CO2: 28 mmol/L (ref 22–32)
CREATININE: 0.94 mg/dL (ref 0.61–1.24)
GFR calc Af Amer: >60 mL/min (ref >60)
GFR calc non Af Amer: >60 mL/min (ref >60)
GLUCOSE: 110 mg/dL — AB (ref 65–99)
Potassium: 3.8 mmol/L (ref 3.5–5.1)
Sodium: 138 mmol/L (ref 135–145)

## 2016-05-21 LAB — CBC
HEMATOCRIT: 45.3 % (ref 39.0–52.0)
HEMOGLOBIN: 14.7 g/dL (ref 13.0–17.0)
MCH: 28.5 pg (ref 26.0–34.0)
MCHC: 32.5 g/dL (ref 30.0–36.0)
MCV: 88 fL (ref 78.0–100.0)
Platelets: 158 10*3/uL (ref 150–400)
RBC: 5.15 MIL/uL (ref 4.22–5.81)
RDW: 14.1 % (ref 11.5–15.5)
WBC: 7 10*3/uL (ref 4.0–10.5)

## 2016-05-21 MED ORDER — FUROSEMIDE 10 MG/ML IJ SOLN
40.0000 mg | Freq: Once | INTRAMUSCULAR | Status: AC
  Administered 2016-05-21: 40 mg via INTRAVENOUS
  Filled 2016-05-21: qty 4

## 2016-05-21 NOTE — Care Management Important Message (Signed)
Important Message  Patient Details  Name: Brad Woods MRN: [id number] Date of Birth: 03/25/1946   Medicare Important Message Given:  Yes    Lacretia Leigh, RN 05/21/2016, 8:50 AM

## 2016-05-21 NOTE — Progress Notes (Signed)
RefJoneen Boers, MD   Subjective:  C/O shortness of breath. No chest pain.  Objective:  Vital Signs in the last 24 hours: Temp:  [97.5 F (36.4 C)-98.5 F (36.9 C)] 98.5 F (36.9 C) (05/29 0724) Pulse Rate:  [58-75] 68 (05/29 1000) Cardiac Rhythm:  [-] Normal sinus rhythm (05/29 1000) Resp:  [14-27] 20 (05/29 0724) BP: (111-150)/(41-91) 137/63 mmHg (05/29 0724) SpO2:  [90 %-99 %] 90 % (05/29 1000) Weight:  [138.211 kg (304 lb 11.2 oz)] 138.211 kg (304 lb 11.2 oz) (05/29 0600)  Physical Exam: BP Readings from Last 1 Encounters:  05/21/16 137/63    Wt Readings from Last 1 Encounters:  05/21/16 138.211 kg (304 lb 11.2 oz)    Weight change:   HEENT: [place]/AT, Eyes-PERL, EOMI, Conjunctiva-Pink, Sclera-Non-icteric Neck: + JVD at 90 degree angle, No bruit, Trachea midline. Lungs:  Clear, Bilateral. Cardiac:  Regular rhythm, normal S1 and S2, no S3.  Abdomen:  Soft, non-tender. Extremities:  No edema present. No cyanosis. No clubbing. CNS: AxOx3, Cranial nerves grossly intact, moves all 4 extremities. Right handed. Skin: Warm and dry.   Intake/Output from previous day: 05/28 0701 - 05/29 0700 In: 1179.6 [I.V.:1179.6] Out: 1925 [Urine:1925]    Lab Results: BMET    Component Value Date/Time   NA 138 05/21/2016 0238   NA 136 05/20/2016 0302   NA 135 05/19/2016 0513   K 3.8 05/21/2016 0238   K 4.0 05/20/2016 0302   K 4.0 05/19/2016 0513   CL 103 05/21/2016 0238   CL 102 05/20/2016 0302   CL 101 05/19/2016 0513   CO2 28 05/21/2016 0238   CO2 26 05/20/2016 0302   CO2 27 05/19/2016 0513   GLUCOSE 110* 05/21/2016 0238   GLUCOSE 121* 05/20/2016 0302   GLUCOSE 116* 05/19/2016 0513   BUN 11 05/21/2016 0238   BUN 12 05/20/2016 0302   BUN 17 05/19/2016 0513   CREATININE 0.94 05/21/2016 0238   CREATININE 0.90 05/20/2016 0302   CREATININE 0.92 05/19/2016 0513   CALCIUM 9.0 05/21/2016 0238   CALCIUM 9.4 05/20/2016 0302   CALCIUM 9.0 05/19/2016 0513   GFRNONAA >60  05/21/2016 0238   GFRNONAA >60 05/20/2016 0302   GFRNONAA >60 05/19/2016 0513   GFRAA >60 05/21/2016 0238   GFRAA >60 05/20/2016 0302   GFRAA >60 05/19/2016 0513   CBC    Component Value Date/Time   WBC 7.0 05/21/2016 0238   RBC 5.15 05/21/2016 0238   HGB 14.7 05/21/2016 0238   HCT 45.3 05/21/2016 0238   PLT 158 05/21/2016 0238   MCV 88.0 05/21/2016 0238   MCH 28.5 05/21/2016 0238   MCHC 32.5 05/21/2016 0238   RDW 14.1 05/21/2016 0238   HEPATIC Function Panel No results for input(s): PROT in the last 8760 hours.  Invalid input(s):  ALBUMIN,  AST,  ALT,  ALKPHOS,  BILIDIR,  IBILI HEMOGLOBIN A1C No components found for: HGA1C,  MPG CARDIAC ENZYMES Lab Results  Component Value Date   TROPONINI 0.26* 05/20/2016   TROPONINI 0.86* 05/19/2016   TROPONINI 0.98* 05/19/2016   BNP No results for input(s): PROBNP in the last 8760 hours. TSH No results for input(s): TSH in the last 8760 hours. CHOLESTEROL  Recent Labs  05/19/16 0514  CHOL 183    Scheduled Meds: . antiseptic oral rinse  7 mL Mouth Rinse BID  . aspirin EC  81 mg Oral Daily  . atorvastatin  80 mg Oral q1800  . carvedilol  3.125 mg Oral BID WC  .  finasteride  5 mg Oral Daily  . furosemide  40 mg Intravenous Once  . sodium chloride flush  3 mL Intravenous Q12H  . ticagrelor  90 mg Oral BID   Continuous Infusions: . 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 Increase activity.   LOS: 3 days    Dixie Dials  MD  05/21/2016, 10:47 AM

## 2016-05-22 ENCOUNTER — Encounter (HOSPITAL_COMMUNITY): Payer: Self-pay | Admitting: Cardiovascular Disease

## 2016-05-22 DIAGNOSIS — I214 Non-ST elevation (NSTEMI) myocardial infarction: Secondary | ICD-10-CM | POA: Diagnosis not present

## 2016-05-22 DIAGNOSIS — I10 Essential (primary) hypertension: Secondary | ICD-10-CM | POA: Diagnosis not present

## 2016-05-22 DIAGNOSIS — I25110 Atherosclerotic heart disease of native coronary artery with unstable angina pectoris: Secondary | ICD-10-CM | POA: Diagnosis not present

## 2016-05-22 DIAGNOSIS — E663 Overweight: Secondary | ICD-10-CM | POA: Diagnosis not present

## 2016-05-22 LAB — CBC
HCT: 47.3 % (ref 39.0–52.0)
HEMOGLOBIN: 15.6 g/dL (ref 13.0–17.0)
MCH: 29.1 pg (ref 26.0–34.0)
MCHC: 33 g/dL (ref 30.0–36.0)
MCV: 88.1 fL (ref 78.0–100.0)
Platelets: 163 10*3/uL (ref 150–400)
RBC: 5.37 MIL/uL (ref 4.22–5.81)
RDW: 14.1 % (ref 11.5–15.5)
WBC: 9 10*3/uL (ref 4.0–10.5)

## 2016-05-22 MED ORDER — ATORVASTATIN CALCIUM 80 MG PO TABS: 80.0000 mg | ORAL_TABLET | Freq: Every day | ORAL | Status: DC

## 2016-05-22 MED ORDER — CARVEDILOL 3.125 MG PO TABS: 3.1250 mg | ORAL_TABLET | Freq: Two times a day (BID) | ORAL | Status: DC

## 2016-05-22 MED ORDER — ASPIRIN 81 MG PO TBEC: 81.0000 mg | DELAYED_RELEASE_TABLET | Freq: Every day | ORAL | Status: DC

## 2016-05-22 MED ORDER — TICAGRELOR 90 MG PO TABS: 90.0000 mg | ORAL_TABLET | Freq: Two times a day (BID) | ORAL | Status: DC

## 2016-05-22 MED ORDER — LISINOPRIL 2.5 MG PO TABS: 2.5000 mg | ORAL_TABLET | Freq: Every day | ORAL | Status: DC

## 2016-05-22 MED FILL — Heparin Sodium (Porcine) 2 Unit/ML in Sodium Chloride 0.9%: INTRAMUSCULAR | Qty: 500 | Status: AC

## 2016-05-22 NOTE — Discharge Summary (Signed)
Physician Discharge Summary  Patient ID: Brad Woods MRN: [id number] DOB/AGE: [birth year] 70 y.o.  Admit date: 05/18/2016 Discharge date: 05/22/2016  Admission Diagnoses: Unstable angina Shortness of breath Hypertension Hyperlipidemia Morbid obesity  Discharge Diagnoses:  Principle problem: * Acute non-Q wave MI * Active Problems:   CAD, native vessel, multi-vessel   S/P LCX stent   Shortness of breath   Hypertension   Hyperlipidemia   Morbid obesity   Hyperglycemia  Discharged Condition: fair  Hospital Course: 70 y.o. male who presented to the Emergency Department complaining of intermittent chest pain that radiated to the left side of his neck and jaw onset night before admission. He stated that he was having associated symptoms of indigestion and chest pain. NTG and belching relieved symptoms for a short duration. His Troponin I was minimally elevated but his chest pain continued. He underwent emergent cardiac cath and mid LCX angioplasty with Synergy DES 2.25 x 24 mm post dilated with 2.5 mm balloon. He had some shortness of breath post procedure, improved with diuresis. He will follow healthy life style and take medications to decrease cholesterol level. His hyperglycemia may improve with weight loss and diet. He will see me in 1 week and primary care Dr.Boals in 1 month.  Consults: cardiology  Significant Diagnostic Studies: labs: CBC- normal, BMET-normal except glucose of 110 mg. Troponin-I peaked at 0.98 on 5/27/2017and came down to 0.26 on 05/20/2016. Total cholesterol 183, HDL cholesterol of 31 and LDL cholesterol of 124 mg.  EKG-SR, RBBB and lateral and inferior infarct.  Chest XR-Unremarkable.  Cardiac cath: Severe mid Left circumflex artery disease. 90 % stenosis decreased to 0 % stenosis post 2.25 mm x 24 mm DES placement.  Echocardiogram:  - Left ventricle: The cavity size was normal. There was mild concentric hypertrophy. Systolic function was normal. The  estimated ejection fraction was in the range of 60% to 65%. Wall motion was normal; there were no regional wall motion abnormalities. Doppler parameters are consistent with abnormal left ventricular relaxation (grade 1 diastolic dysfunction). - Left atrium: The atrium was moderately dilated. - Right atrium: The atrium was mildly dilated.  Treatments: cardiac meds: lisinopril (generic), aspirin, Brilinta, carvedilol and atorvastatin.  Discharge Exam: Blood pressure 114/82, pulse 62, temperature 99.4 F (37.4 C), temperature source Oral, resp. rate 20, height 6\' 2"  (1.88 m), weight 135.988 kg (299 lb 12.8 oz), SpO2 96 %. Physical Exam: HEENT: [place]/AT, Eyes-PERL, EOMI, Conjunctiva-Pink, Sclera-Non-icteric Neck: No JVD at 90 degree angle, No bruit, Trachea midline. Lungs: Clear, Bilateral. Cardiac: Regular rhythm, normal S1 and S2, no S3.  Abdomen: Soft, non-tender. Extremities: No edema present. No cyanosis. No clubbing. CNS: AxOx3, Cranial nerves grossly intact, moves all 4 extremities. Right handed. Skin: Warm and dry.  Disposition:   Discharge Instructions    AMB Referral to Cardiac Rehabilitation - Phase II    Complete by:  As directed   Diagnosis:  NSTEMI            Medication List    TAKE these medications        aspirin 81 MG EC tablet  Take 1 tablet (81 mg total) by mouth daily.     atorvastatin 80 MG tablet  Commonly known as:  LIPITOR  Take 1 tablet (80 mg total) by mouth daily at 6 PM.     carvedilol 3.125 MG tablet  Commonly known as:  COREG  Take 1 tablet (3.125 mg total) by mouth 2 (two) times daily with a meal.  finasteride 5 MG tablet  Commonly known as:  PROSCAR  Take 5 mg by mouth daily.     fluticasone 50 MCG/ACT nasal spray  Commonly known as:  FLONASE  Place 2 sprays into the nose daily as needed for allergies.     lisinopril 2.5 MG tablet  Commonly known as:  PRINIVIL,ZESTRIL  Take 1 tablet (2.5 mg total) by mouth daily.      nitroGLYCERIN 0.4 MG SL tablet  Commonly known as:  NITROSTAT  Place 0.4 mg under the tongue every 5 (five) minutes as needed for chest pain.     tamsulosin 0.4 MG Caps capsule  Commonly known as:  FLOMAX  TAKE ONE CAPSULE BY MOUTH EVERY DAY     ticagrelor 90 MG Tabs tablet  Commonly known as:  BRILINTA  Take 1 tablet (90 mg total) by mouth 2 (two) times daily.           Follow-up Information    Follow up with BOALS,AARON, MD. Schedule an appointment as soon as possible for a visit in 1 month.   Specialty:  Family Medicine   Contact information:   [address] [place] [address] [phone]       Follow up with [facility], MD. Schedule an appointment as soon as possible for a visit in 1 week.   Specialty:  Cardiology   Contact information:   [address] [place] [address] [phone]       Signed: Ricki Rodriguez 05/22/2016, 8:50 AM

## 2016-05-22 NOTE — Progress Notes (Signed)
Pt will be discharged home with wife. Discharge instructions given and all questions answered.

## 2016-05-22 NOTE — Progress Notes (Signed)
CARDIAC REHAB PHASE I   PRE:  Rate/Rhythm: NT  BP:  Sitting: 145/89        SaO2: 97 RA  MODE:  Ambulation: 350 ft   POST:  Rate/Rhythm: NT  BP:  Sitting: 155/83         SaO2: 96 RA  Pt c/o shortness of breath at rest and with exertion, states he thinks it is a side effect of the brilinta. Pt ambulated 350 ft on RA, independent, steady gait, tolerated well.  Pt c/o mild DOE, denies cp, dizziness, declined rest stop. Completed MI/stent education with pt and wife at bedside. Reviewed risk factors, MI book, anti-platelet therapy, stent card, activity restrictions, ntg, exercise, heart healthy diet, portion control, and phase 2 cardiac rehab. Pt verbalized understanding, receptive to education. Pt agrees to phase 2 cardiac rehab referral, will send to [facility] per pt request.   [id number] Lenna Sciara, RN, BSN 05/22/2016 10:26 AM

## 2016-05-23 LAB — POCT ACTIVATED CLOTTING TIME: ACTIVATED CLOTTING TIME: 492 s

## 2016-05-24 DIAGNOSIS — Z125 Encounter for screening for malignant neoplasm of prostate: Secondary | ICD-10-CM | POA: Diagnosis not present

## 2016-05-24 DIAGNOSIS — Z0000 Encounter for general adult medical examination without abnormal findings: Secondary | ICD-10-CM | POA: Diagnosis not present

## 2016-05-24 DIAGNOSIS — I2510 Atherosclerotic heart disease of native coronary artery without angina pectoris: Secondary | ICD-10-CM | POA: Diagnosis not present

## 2016-05-24 DIAGNOSIS — I10 Essential (primary) hypertension: Secondary | ICD-10-CM | POA: Diagnosis not present

## 2016-05-24 DIAGNOSIS — Z131 Encounter for screening for diabetes mellitus: Secondary | ICD-10-CM | POA: Diagnosis not present

## 2016-05-24 DIAGNOSIS — Z6841 Body Mass Index (BMI) 40.0 and over, adult: Secondary | ICD-10-CM | POA: Diagnosis not present

## 2016-05-24 DIAGNOSIS — E7800 Pure hypercholesterolemia, unspecified: Secondary | ICD-10-CM | POA: Diagnosis not present

## 2016-05-29 DIAGNOSIS — E663 Overweight: Secondary | ICD-10-CM | POA: Diagnosis not present

## 2016-05-29 DIAGNOSIS — I4519 Other right bundle-branch block: Secondary | ICD-10-CM | POA: Diagnosis not present

## 2016-05-29 DIAGNOSIS — Z9861 Coronary angioplasty status: Secondary | ICD-10-CM | POA: Diagnosis not present

## 2016-05-29 DIAGNOSIS — I10 Essential (primary) hypertension: Secondary | ICD-10-CM | POA: Diagnosis not present

## 2016-05-29 DIAGNOSIS — I2510 Atherosclerotic heart disease of native coronary artery without angina pectoris: Secondary | ICD-10-CM | POA: Diagnosis not present

## 2016-06-28 DIAGNOSIS — E663 Overweight: Secondary | ICD-10-CM | POA: Diagnosis not present

## 2016-06-28 DIAGNOSIS — I10 Essential (primary) hypertension: Secondary | ICD-10-CM | POA: Diagnosis not present

## 2016-06-28 DIAGNOSIS — I2510 Atherosclerotic heart disease of native coronary artery without angina pectoris: Secondary | ICD-10-CM | POA: Diagnosis not present

## 2016-06-28 DIAGNOSIS — Z9861 Coronary angioplasty status: Secondary | ICD-10-CM | POA: Diagnosis not present

## 2016-07-04 DIAGNOSIS — L218 Other seborrheic dermatitis: Secondary | ICD-10-CM | POA: Diagnosis not present

## 2016-07-04 DIAGNOSIS — L304 Erythema intertrigo: Secondary | ICD-10-CM | POA: Diagnosis not present

## 2016-07-04 DIAGNOSIS — D1801 Hemangioma of skin and subcutaneous tissue: Secondary | ICD-10-CM | POA: Diagnosis not present

## 2016-07-04 DIAGNOSIS — L821 Other seborrheic keratosis: Secondary | ICD-10-CM | POA: Diagnosis not present

## 2016-07-04 DIAGNOSIS — L578 Other skin changes due to chronic exposure to nonionizing radiation: Secondary | ICD-10-CM | POA: Diagnosis not present

## 2016-07-04 DIAGNOSIS — L570 Actinic keratosis: Secondary | ICD-10-CM | POA: Diagnosis not present

## 2016-07-04 DIAGNOSIS — C44212 Basal cell carcinoma of skin of right ear and external auricular canal: Secondary | ICD-10-CM | POA: Diagnosis not present

## 2016-07-25 DIAGNOSIS — Z85828 Personal history of other malignant neoplasm of skin: Secondary | ICD-10-CM | POA: Diagnosis not present

## 2016-07-25 DIAGNOSIS — C44212 Basal cell carcinoma of skin of right ear and external auricular canal: Secondary | ICD-10-CM | POA: Diagnosis not present

## 2016-08-01 ENCOUNTER — Ambulatory Visit
Admission: RE | Admit: 2016-08-01 | Discharge: 2016-08-01 | Disposition: A | Discharge status: Home / Self Care | Payer: PPO | Source: Ambulatory Visit | Attending: Cardiovascular Disease | Admitting: Cardiovascular Disease

## 2016-08-01 ENCOUNTER — Other Ambulatory Visit: Payer: Self-pay | Admitting: Cardiovascular Disease

## 2016-08-01 DIAGNOSIS — Z9861 Coronary angioplasty status: Secondary | ICD-10-CM | POA: Diagnosis not present

## 2016-08-01 DIAGNOSIS — R0602 Shortness of breath: Secondary | ICD-10-CM

## 2016-08-01 DIAGNOSIS — I10 Essential (primary) hypertension: Secondary | ICD-10-CM | POA: Diagnosis not present

## 2016-08-01 DIAGNOSIS — E663 Overweight: Secondary | ICD-10-CM | POA: Diagnosis not present

## 2016-08-01 DIAGNOSIS — I2510 Atherosclerotic heart disease of native coronary artery without angina pectoris: Secondary | ICD-10-CM | POA: Diagnosis not present

## 2016-08-28 DIAGNOSIS — L570 Actinic keratosis: Secondary | ICD-10-CM | POA: Diagnosis not present

## 2016-09-06 DIAGNOSIS — I10 Essential (primary) hypertension: Secondary | ICD-10-CM | POA: Diagnosis not present

## 2016-09-06 DIAGNOSIS — N400 Enlarged prostate without lower urinary tract symptoms: Secondary | ICD-10-CM | POA: Diagnosis not present

## 2016-09-06 DIAGNOSIS — K219 Gastro-esophageal reflux disease without esophagitis: Secondary | ICD-10-CM | POA: Diagnosis not present

## 2016-09-06 DIAGNOSIS — J309 Allergic rhinitis, unspecified: Secondary | ICD-10-CM | POA: Diagnosis not present

## 2016-09-06 DIAGNOSIS — E7800 Pure hypercholesterolemia, unspecified: Secondary | ICD-10-CM | POA: Diagnosis not present

## 2016-09-06 DIAGNOSIS — I2510 Atherosclerotic heart disease of native coronary artery without angina pectoris: Secondary | ICD-10-CM | POA: Diagnosis not present

## 2016-09-06 DIAGNOSIS — R05 Cough: Secondary | ICD-10-CM | POA: Diagnosis not present

## 2016-09-06 DIAGNOSIS — Z23 Encounter for immunization: Secondary | ICD-10-CM | POA: Diagnosis not present

## 2016-10-08 DIAGNOSIS — Z13220 Encounter for screening for lipoid disorders: Secondary | ICD-10-CM | POA: Diagnosis not present

## 2016-10-08 DIAGNOSIS — Z23 Encounter for immunization: Secondary | ICD-10-CM | POA: Diagnosis not present

## 2016-10-08 DIAGNOSIS — Z0000 Encounter for general adult medical examination without abnormal findings: Secondary | ICD-10-CM | POA: Diagnosis not present

## 2016-10-08 DIAGNOSIS — G20 Parkinson's disease: Secondary | ICD-10-CM | POA: Diagnosis not present

## 2016-10-08 DIAGNOSIS — Z1211 Encounter for screening for malignant neoplasm of colon: Secondary | ICD-10-CM | POA: Diagnosis not present

## 2016-10-08 DIAGNOSIS — Z130 Encounter for screening for diseases of the blood and blood-forming organs and certain disorders involving the immune mechanism: Secondary | ICD-10-CM | POA: Diagnosis not present

## 2016-10-20 DIAGNOSIS — S0083XA Contusion of other part of head, initial encounter: Secondary | ICD-10-CM | POA: Diagnosis not present

## 2016-10-20 DIAGNOSIS — W19XXXA Unspecified fall, initial encounter: Secondary | ICD-10-CM | POA: Diagnosis not present

## 2016-10-20 DIAGNOSIS — S60222A Contusion of left hand, initial encounter: Secondary | ICD-10-CM | POA: Diagnosis not present

## 2016-10-22 DIAGNOSIS — Z23 Encounter for immunization: Secondary | ICD-10-CM | POA: Diagnosis not present

## 2016-11-02 DIAGNOSIS — H43813 Vitreous degeneration, bilateral: Secondary | ICD-10-CM | POA: Diagnosis not present

## 2016-11-02 DIAGNOSIS — H2513 Age-related nuclear cataract, bilateral: Secondary | ICD-10-CM | POA: Diagnosis not present

## 2016-11-02 DIAGNOSIS — H04123 Dry eye syndrome of bilateral lacrimal glands: Secondary | ICD-10-CM | POA: Diagnosis not present

## 2016-11-09 ENCOUNTER — Encounter: Payer: Self-pay | Admitting: Cardiovascular Disease

## 2016-11-20 ENCOUNTER — Ambulatory Visit (INDEPENDENT_AMBULATORY_CARE_PROVIDER_SITE_OTHER): Payer: PPO | Admitting: Cardiovascular Disease

## 2016-11-20 ENCOUNTER — Encounter: Payer: Self-pay | Admitting: Nurse Practitioner

## 2016-11-20 ENCOUNTER — Encounter: Payer: Self-pay | Admitting: Cardiovascular Disease

## 2016-11-20 VITALS — BP 110/70 | HR 80 | Ht 74.0 in | Wt 302.8 lb

## 2016-11-20 DIAGNOSIS — I25110 Atherosclerotic heart disease of native coronary artery with unstable angina pectoris: Secondary | ICD-10-CM | POA: Diagnosis not present

## 2016-11-20 DIAGNOSIS — I2510 Atherosclerotic heart disease of native coronary artery without angina pectoris: Secondary | ICD-10-CM | POA: Insufficient documentation

## 2016-11-20 LAB — CBC
HCT: 46 % (ref 38.5–50.0)
HEMOGLOBIN: 15.7 g/dL (ref 13.2–17.1)
MCH: 29.6 pg (ref 27.0–33.0)
MCHC: 34.1 g/dL (ref 32.0–36.0)
MCV: 86.8 fL (ref 80.0–100.0)
MPV: 10.1 fL (ref 7.5–12.5)
PLATELETS: 181 10*3/uL (ref 140–400)
RBC: 5.3 MIL/uL (ref 4.20–5.80)
RDW: 14.5 % (ref 11.0–15.0)
WBC: 8 10*3/uL (ref 3.8–10.8)

## 2016-11-20 LAB — COMPREHENSIVE METABOLIC PANEL
ALK PHOS: 83 U/L (ref 40–115)
ALT: 20 U/L (ref 9–46)
AST: 19 U/L (ref 10–35)
Albumin: 4.1 g/dL (ref 3.6–5.1)
BUN: 15 mg/dL (ref 7–25)
CHLORIDE: 101 mmol/L (ref 98–110)
CO2: 24 mmol/L (ref 20–31)
CREATININE: 0.96 mg/dL (ref 0.70–1.25)
Calcium: 9.3 mg/dL (ref 8.6–10.3)
GLUCOSE: 86 mg/dL (ref 65–99)
Potassium: 4.1 mmol/L (ref 3.5–5.3)
SODIUM: 137 mmol/L (ref 135–146)
TOTAL PROTEIN: 6.9 g/dL (ref 6.1–8.1)
Total Bilirubin: 1.1 mg/dL (ref 0.2–1.2)

## 2016-11-20 LAB — LIPID PANEL
Cholesterol: 136 mg/dL (ref <200)
HDL: 34 mg/dL — AB (ref >40)
LDL CALC: 71 mg/dL (ref <100)
TRIGLYCERIDES: 156 mg/dL — AB (ref <150)
Total CHOL/HDL Ratio: 4 Ratio (ref <5.0)
VLDL: 31 mg/dL — AB (ref <30)

## 2016-11-20 MED ORDER — ISOSORBIDE MONONITRATE ER 30 MG PO TB24: 30.0000 mg | ORAL_TABLET | Freq: Every day | ORAL | 11 refills | Status: DC

## 2016-11-20 MED ORDER — ALBUTEROL SULFATE HFA 108 (90 BASE) MCG/ACT IN AERS: 2.0000 | INHALATION_SPRAY | Freq: Four times a day (QID) | RESPIRATORY_TRACT | 2 refills | Status: DC | PRN

## 2016-11-20 NOTE — Patient Instructions (Addendum)
Medication Instructions:  START Imdur 30 mg once daily USE Albuterol inhaler 2 puffs every 6 hours as needed for wheezing/cough  Labwork: TODAY - pt/inr, complete metabolic panel, cholesterol, CBC   Testing/Procedures: None Ordered   Follow-Up: Your physician recommends that you return to see Dr. Acie Fredrickson on January 8 at 8:45 am  If you need a refill on your cardiac medications before your next appointment, please call your pharmacy.   Thank you for choosing [facility]! Christen Bame, RN [phone]

## 2016-11-20 NOTE — Progress Notes (Signed)
Cardiology Office Note   Date:  11/20/2016   ID:  Brad Woods, DOB Jun 06, 1946, MRN [id number]  PCP:  [name], MD  Cardiologist:   Kristeen Miss, MD   Chief Complaint  Patient presents with  . Coronary Artery Disease      History of Present Illness: Brad Woods is a 70 y.o. male who presents for follow up of his CAD.   Seen with wife , Brad Woods  Has been having CP since May. Went to the [facility] , was sent to the hospital May 18, 2016 Was admitted,    At 2 AM , his CP worsened acutely .   Had a stent to his LCx.   ( 90% - 0%)   He wanted to change cardiologist and presents for further management .   Has been having CP similar to his presenting symptom in May. Seem to be worse with exertion, better with rest.   Had another episode of CP last night. Mid sternal CP, radiation down his left arm .  Took a SL NTG  - CP was relieved with NTG .   Has had some shortness of breath - possibly shortly after taking the Brilinta   Also has some belching relieves the CP    does not get any regular exercise. Before the MI, was going several times a week   Past Medical History:  Diagnosis Date  . Allergy   . Arthritis   . Asthma    when younger  . GERD (gastroesophageal reflux disease)   . Hyperlipidemia   . Hypertension   . Kidney stones   . Sleep apnea    cpap    Past Surgical History:  Procedure Laterality Date  . CARDIAC CATHETERIZATION N/A 05/20/2016   Procedure: Left Heart Cath and Coronary Angiography;  Surgeon: Runell Gess, MD;  Location: [facility] INVASIVE CV LAB;  Service: Cardiovascular;  Laterality: N/A;  . COLONOSCOPY    . EYE SURGERY    . RECTAL SURGERY    . REPLACEMENT TOTAL KNEE Bilateral      Current Outpatient Prescriptions  Medication Sig Dispense Refill  . aspirin EC 81 MG EC tablet Take 1 tablet (81 mg total) by mouth daily.    Marland Kitchen atorvastatin (LIPITOR) 80 MG tablet Take 1 tablet (80 mg total) by mouth daily at 6 PM. 30 tablet 3  .  carvedilol (COREG) 3.125 MG tablet Take 1 tablet (3.125 mg total) by mouth 2 (two) times daily with a meal. 60 tablet 3  . finasteride (PROSCAR) 5 MG tablet Take 5 mg by mouth daily.    . fluticasone (FLONASE) 50 MCG/ACT nasal spray Place 2 sprays into the nose daily as needed for allergies.     . nitroGLYCERIN (NITROSTAT) 0.4 MG SL tablet Place 0.4 mg under the tongue every 5 (five) minutes as needed for chest pain.    Marland Kitchen omeprazole (PRILOSEC) 40 MG capsule Take 40 mg by mouth daily.    . tamsulosin (FLOMAX) 0.4 MG CAPS capsule TAKE ONE CAPSULE BY MOUTH EVERY DAY    . ticagrelor (BRILINTA) 90 MG TABS tablet Take 1 tablet (90 mg total) by mouth 2 (two) times daily. 60 tablet 3   No current facility-administered medications for this visit.     Allergies:   Hydromorphone hcl    Social History:  The patient  reports that he has never smoked. He has never used smokeless tobacco. He reports that he drinks alcohol. He reports that he does not use  drugs.   Family History:  The patient's family history includes Colon cancer (age of onset: 30) in his mother.    ROS:  Please see the history of present illness.    Review of Systems: Constitutional:  denies fever, chills, diaphoresis, appetite change and fatigue.  HEENT: denies photophobia, eye pain, redness, hearing loss, ear pain, congestion, sore throat, rhinorrhea, sneezing, neck pain, neck stiffness and tinnitus.  Respiratory: denies SOB, DOE, cough, chest tightness, and wheezing.  Cardiovascular: admits to chest pain this afternoon and with exertion   Gastrointestinal: denies nausea, vomiting, abdominal pain, diarrhea, constipation, blood in stool.  Genitourinary: denies dysuria, urgency, frequency, hematuria, flank pain and difficulty urinating.  Musculoskeletal: denies  myalgias, back pain, joint swelling, arthralgias and gait problem.   Skin: denies pallor, rash and wound.  Neurological: denies dizziness, seizures, syncope, weakness,  light-headedness, numbness and headaches.   Hematological: denies adenopathy, easy bruising, personal or family bleeding history.  Psychiatric/ Behavioral: denies suicidal ideation, mood changes, confusion, nervousness, sleep disturbance and agitation.       All other systems are reviewed and negative.    PHYSICAL EXAM: VS:  BP 110/70 (BP Location: Right Arm, Cuff Size: Large)   Pulse 80   Ht 6\' 2"  (1.88 m)   Wt (!) 302 lb 12.8 oz (137.3 kg)   SpO2 98%   BMI 38.88 kg/m  , BMI Body mass index is 38.88 kg/m. GEN: Well nourished, well developed, in no acute distress  HEENT: normal  Neck: no JVD, carotid bruits, or masses Cardiac: RRR; no murmurs, rubs, or gallops,no edema  Respiratory:  clear to auscultation bilaterally, normal work of breathing GI: soft, nontender, nondistended, + BS MS: no deformity or atrophy  Skin: warm and dry, no rash Neuro:  Strength and sensation are intact Psych: normal   EKG:  EKG is ordered today. The ekg ordered today demonstrates Nov. 28, 2017:  NSR , RBBB, Inf. MI   Recent Labs: 05/18/2016: Magnesium 2.3 05/21/2016: BUN 11; Creatinine, Ser 0.94; Potassium 3.8; Sodium 138 05/22/2016: Hemoglobin 15.6; Platelets 163    Lipid Panel    Component Value Date/Time   CHOL 183 05/19/2016 0514   TRIG 141 05/19/2016 0514   HDL 31 (L) 05/19/2016 0514   CHOLHDL 5.9 05/19/2016 0514   VLDL 28 05/19/2016 0514   LDLCALC 124 (H) 05/19/2016 0514      Wt Readings from Last 3 Encounters:  11/20/16 (!) 302 lb 12.8 oz (137.3 kg)  05/22/16 299 lb 12.8 oz (136 kg)  02/11/15 (!) 327 lb (148.3 kg)      Other studies Reviewed: Additional studies/ records that were reviewed today include: . Review of the above records demonstrates:    ASSESSMENT AND PLAN:  1.  Unstable angina: [name] presents today with symptoms that are consistent with progressive unstable angina. He had stenting of his left circumflex artery in May. He now presents with episodes of  angina. These typically occur with exertion but last night seemed to occur more with rest. He does not have any acute EKG changes today.  The first available Cath Lab slot is on Thursday. We'll start him on isosorbide 30 mg a day. We'll have him go to the hospital if he has any further episodes of angina that do not resolve with nitroglycerin.  2. Hyperlipidemia: Continue current medications.   Current medicines are reviewed at length with the patient today.  The patient does not have concerns regarding medicines.  Labs/ tests ordered today include:  No orders  of the defined types were placed in this encounter.    Disposition:   FU with me in several months      Kristeen Miss, MD  11/20/2016 4:01 PM    [facility] [address] [place], [place], [place]  [address] Phone: [phone]; Fax: [phone]

## 2016-11-21 LAB — PROTIME-INR
INR: 1.1
PROTHROMBIN TIME: 11.6 s — AB (ref 9.0–11.5)

## 2016-11-22 ENCOUNTER — Encounter (HOSPITAL_COMMUNITY): Payer: Self-pay | Admitting: Internal Medicine

## 2016-11-22 ENCOUNTER — Encounter (HOSPITAL_COMMUNITY)
Admission: RE | Disposition: A | Discharge status: Home / Self Care | Payer: Self-pay | Source: Ambulatory Visit | Attending: Internal Medicine

## 2016-11-22 ENCOUNTER — Ambulatory Visit (HOSPITAL_COMMUNITY)
Admission: RE | Admit: 2016-11-22 | Discharge: 2016-11-22 | Disposition: A | Discharge status: Home / Self Care | Payer: PPO | Source: Ambulatory Visit | Attending: Internal Medicine | Admitting: Internal Medicine

## 2016-11-22 DIAGNOSIS — I252 Old myocardial infarction: Secondary | ICD-10-CM | POA: Insufficient documentation

## 2016-11-22 DIAGNOSIS — Z7982 Long term (current) use of aspirin: Secondary | ICD-10-CM | POA: Insufficient documentation

## 2016-11-22 DIAGNOSIS — I10 Essential (primary) hypertension: Secondary | ICD-10-CM | POA: Diagnosis not present

## 2016-11-22 DIAGNOSIS — I25110 Atherosclerotic heart disease of native coronary artery with unstable angina pectoris: Secondary | ICD-10-CM | POA: Insufficient documentation

## 2016-11-22 DIAGNOSIS — J45909 Unspecified asthma, uncomplicated: Secondary | ICD-10-CM | POA: Diagnosis not present

## 2016-11-22 DIAGNOSIS — I200 Unstable angina: Secondary | ICD-10-CM | POA: Diagnosis present

## 2016-11-22 DIAGNOSIS — Z79899 Other long term (current) drug therapy: Secondary | ICD-10-CM | POA: Insufficient documentation

## 2016-11-22 DIAGNOSIS — Z955 Presence of coronary angioplasty implant and graft: Secondary | ICD-10-CM | POA: Diagnosis not present

## 2016-11-22 DIAGNOSIS — G4730 Sleep apnea, unspecified: Secondary | ICD-10-CM | POA: Diagnosis not present

## 2016-11-22 DIAGNOSIS — Z7902 Long term (current) use of antithrombotics/antiplatelets: Secondary | ICD-10-CM | POA: Insufficient documentation

## 2016-11-22 DIAGNOSIS — Z6838 Body mass index (BMI) 38.0-38.9, adult: Secondary | ICD-10-CM | POA: Diagnosis not present

## 2016-11-22 DIAGNOSIS — K219 Gastro-esophageal reflux disease without esophagitis: Secondary | ICD-10-CM | POA: Diagnosis not present

## 2016-11-22 DIAGNOSIS — E669 Obesity, unspecified: Secondary | ICD-10-CM | POA: Diagnosis not present

## 2016-11-22 DIAGNOSIS — Z96653 Presence of artificial knee joint, bilateral: Secondary | ICD-10-CM | POA: Diagnosis not present

## 2016-11-22 DIAGNOSIS — E785 Hyperlipidemia, unspecified: Secondary | ICD-10-CM | POA: Diagnosis not present

## 2016-11-22 HISTORY — PX: CARDIAC CATHETERIZATION: SHX172

## 2016-11-22 SURGERY — LEFT HEART CATH AND CORONARY ANGIOGRAPHY
Anesthesia: LOCAL

## 2016-11-22 MED ORDER — SODIUM CHLORIDE 0.9 % IV SOLN: 250.0000 mL | INTRAVENOUS | Status: DC | PRN

## 2016-11-22 MED ORDER — ISOSORBIDE MONONITRATE ER 60 MG PO TB24
60.0000 mg | ORAL_TABLET | Freq: Every day | ORAL | 11 refills | Status: DC
Start: 2016-11-22 — End: 2017-10-25

## 2016-11-22 MED ORDER — HEPARIN (PORCINE) IN NACL 2-0.9 UNIT/ML-% IJ SOLN
INTRAMUSCULAR | Status: AC
  Filled 2016-11-22: qty 1000

## 2016-11-22 MED ORDER — MIDAZOLAM HCL 2 MG/2ML IJ SOLN
INTRAMUSCULAR | Status: DC | PRN
  Administered 2016-11-22: 1 mg via INTRAVENOUS

## 2016-11-22 MED ORDER — VERAPAMIL HCL 2.5 MG/ML IV SOLN
INTRAVENOUS | Status: DC | PRN
  Administered 2016-11-22: 10 mL via INTRA_ARTERIAL

## 2016-11-22 MED ORDER — CLOPIDOGREL BISULFATE 75 MG PO TABS: 75.0000 mg | ORAL_TABLET | Freq: Every day | ORAL | 3 refills | Status: DC

## 2016-11-22 MED ORDER — SODIUM CHLORIDE 0.9% FLUSH: 3.0000 mL | Freq: Two times a day (BID) | INTRAVENOUS | Status: DC

## 2016-11-22 MED ORDER — LIDOCAINE HCL (PF) 1 % IJ SOLN
INTRAMUSCULAR | Status: DC | PRN
  Administered 2016-11-22: 3 mL

## 2016-11-22 MED ORDER — FENTANYL CITRATE (PF) 100 MCG/2ML IJ SOLN
INTRAMUSCULAR | Status: DC | PRN
  Administered 2016-11-22: 50 ug via INTRAVENOUS

## 2016-11-22 MED ORDER — SODIUM CHLORIDE 0.9% FLUSH: 3.0000 mL | INTRAVENOUS | Status: DC | PRN

## 2016-11-22 MED ORDER — LIDOCAINE HCL (PF) 1 % IJ SOLN
INTRAMUSCULAR | Status: AC
  Filled 2016-11-22: qty 30

## 2016-11-22 MED ORDER — SODIUM CHLORIDE 0.9 % IV SOLN: INTRAVENOUS | Status: DC

## 2016-11-22 MED ORDER — IOPAMIDOL (ISOVUE-370) INJECTION 76%
INTRAVENOUS | Status: DC | PRN
  Administered 2016-11-22: 90 mL via INTRAVENOUS

## 2016-11-22 MED ORDER — HEPARIN SODIUM (PORCINE) 1000 UNIT/ML IJ SOLN
INTRAMUSCULAR | Status: AC
  Filled 2016-11-22: qty 1

## 2016-11-22 MED ORDER — HEPARIN (PORCINE) IN NACL 2-0.9 UNIT/ML-% IJ SOLN
INTRAMUSCULAR | Status: DC | PRN
  Administered 2016-11-22: 1000 mL

## 2016-11-22 MED ORDER — MIDAZOLAM HCL 2 MG/2ML IJ SOLN
INTRAMUSCULAR | Status: AC
  Filled 2016-11-22: qty 2

## 2016-11-22 MED ORDER — IOPAMIDOL (ISOVUE-370) INJECTION 76%
INTRAVENOUS | Status: AC
  Filled 2016-11-22: qty 100

## 2016-11-22 MED ORDER — HEPARIN SODIUM (PORCINE) 1000 UNIT/ML IJ SOLN
INTRAMUSCULAR | Status: DC | PRN
  Administered 2016-11-22: 5000 [IU] via INTRAVENOUS

## 2016-11-22 MED ORDER — FENTANYL CITRATE (PF) 100 MCG/2ML IJ SOLN
INTRAMUSCULAR | Status: AC
  Filled 2016-11-22: qty 2

## 2016-11-22 MED ORDER — SODIUM CHLORIDE 0.9 % WEIGHT BASED INFUSION
3.0000 mL/kg/h | INTRAVENOUS | Status: AC
  Administered 2016-11-22: 3 mL/kg/h via INTRAVENOUS

## 2016-11-22 MED ORDER — ASPIRIN 81 MG PO CHEW: 81.0000 mg | CHEWABLE_TABLET | ORAL | Status: DC

## 2016-11-22 MED ORDER — SODIUM CHLORIDE 0.9 % WEIGHT BASED INFUSION: 1.0000 mL/kg/h | INTRAVENOUS | Status: DC

## 2016-11-22 SURGICAL SUPPLY — 10 items
CATH IMPULSE 5F ANG/FL3.5 (CATHETERS) ×1 IMPLANT
DEVICE RAD COMP TR BAND LRG (VASCULAR PRODUCTS) ×1 IMPLANT
GLIDESHEATH SLEND SS 6F .021 (SHEATH) ×1 IMPLANT
GUIDEWIRE INQWIRE 1.5J.035X260 (WIRE) IMPLANT
INQWIRE 1.5J .035X260CM (WIRE) ×2
KIT HEART LEFT (KITS) ×2 IMPLANT
PACK CARDIAC CATHETERIZATION (CUSTOM PROCEDURE TRAY) ×2 IMPLANT
SYR MEDRAD MARK V 150ML (SYRINGE) ×2 IMPLANT
TRANSDUCER W/STOPCOCK (MISCELLANEOUS) ×2 IMPLANT
TUBING CIL FLEX 10 FLL-RA (TUBING) ×2 IMPLANT

## 2016-11-22 NOTE — H&P (View-Only) (Signed)
Cardiology Office Note   Date:  11/20/2016   ID:  Brad Woods, DOB Jun 06, 1946, MRN [id number]  PCP:  [name], MD  Cardiologist:   Kristeen Miss, MD   Chief Complaint  Patient presents with  . Coronary Artery Disease      History of Present Illness: Brad Woods is a 70 y.o. male who presents for follow up of his CAD.   Seen with wife , [name]  Has been having CP since May. Went to the [facility] , was sent to the hospital May 18, 2016 Was admitted,    At 2 AM , his CP worsened acutely .   Had a stent to his LCx.   ( 90% - 0%)   He wanted to change cardiologist and presents for further management .   Has been having CP similar to his presenting symptom in May. Seem to be worse with exertion, better with rest.   Had another episode of CP last night. Mid sternal CP, radiation down his left arm .  Took a SL NTG  - CP was relieved with NTG .   Has had some shortness of breath - possibly shortly after taking the Brilinta   Also has some belching relieves the CP    does not get any regular exercise. Before the MI, was going several times a week   Past Medical History:  Diagnosis Date  . Allergy   . Arthritis   . Asthma    when younger  . GERD (gastroesophageal reflux disease)   . Hyperlipidemia   . Hypertension   . Kidney stones   . Sleep apnea    cpap    Past Surgical History:  Procedure Laterality Date  . CARDIAC CATHETERIZATION N/A 05/20/2016   Procedure: Left Heart Cath and Coronary Angiography;  Surgeon: Runell Gess, MD;  Location: [facility] INVASIVE CV LAB;  Service: Cardiovascular;  Laterality: N/A;  . COLONOSCOPY    . EYE SURGERY    . RECTAL SURGERY    . REPLACEMENT TOTAL KNEE Bilateral      Current Outpatient Prescriptions  Medication Sig Dispense Refill  . aspirin EC 81 MG EC tablet Take 1 tablet (81 mg total) by mouth daily.    Marland Kitchen atorvastatin (LIPITOR) 80 MG tablet Take 1 tablet (80 mg total) by mouth daily at 6 PM. 30 tablet 3  .  carvedilol (COREG) 3.125 MG tablet Take 1 tablet (3.125 mg total) by mouth 2 (two) times daily with a meal. 60 tablet 3  . finasteride (PROSCAR) 5 MG tablet Take 5 mg by mouth daily.    . fluticasone (FLONASE) 50 MCG/ACT nasal spray Place 2 sprays into the nose daily as needed for allergies.     . nitroGLYCERIN (NITROSTAT) 0.4 MG SL tablet Place 0.4 mg under the tongue every 5 (five) minutes as needed for chest pain.    Marland Kitchen omeprazole (PRILOSEC) 40 MG capsule Take 40 mg by mouth daily.    . tamsulosin (FLOMAX) 0.4 MG CAPS capsule TAKE ONE CAPSULE BY MOUTH EVERY DAY    . ticagrelor (BRILINTA) 90 MG TABS tablet Take 1 tablet (90 mg total) by mouth 2 (two) times daily. 60 tablet 3   No current facility-administered medications for this visit.     Allergies:   Hydromorphone hcl    Social History:  The patient  reports that he has never smoked. He has never used smokeless tobacco. He reports that he drinks alcohol. He reports that he does not use  drugs.   Family History:  The patient's family history includes Colon cancer (age of onset: 30) in his mother.    ROS:  Please see the history of present illness.    Review of Systems: Constitutional:  denies fever, chills, diaphoresis, appetite change and fatigue.  HEENT: denies photophobia, eye pain, redness, hearing loss, ear pain, congestion, sore throat, rhinorrhea, sneezing, neck pain, neck stiffness and tinnitus.  Respiratory: denies SOB, DOE, cough, chest tightness, and wheezing.  Cardiovascular: admits to chest pain this afternoon and with exertion   Gastrointestinal: denies nausea, vomiting, abdominal pain, diarrhea, constipation, blood in stool.  Genitourinary: denies dysuria, urgency, frequency, hematuria, flank pain and difficulty urinating.  Musculoskeletal: denies  myalgias, back pain, joint swelling, arthralgias and gait problem.   Skin: denies pallor, rash and wound.  Neurological: denies dizziness, seizures, syncope, weakness,  light-headedness, numbness and headaches.   Hematological: denies adenopathy, easy bruising, personal or family bleeding history.  Psychiatric/ Behavioral: denies suicidal ideation, mood changes, confusion, nervousness, sleep disturbance and agitation.       All other systems are reviewed and negative.    PHYSICAL EXAM: VS:  BP 110/70 (BP Location: Right Arm, Cuff Size: Large)   Pulse 80   Ht 6\' 2"  (1.88 m)   Wt (!) 302 lb 12.8 oz (137.3 kg)   SpO2 98%   BMI 38.88 kg/m  , BMI Body mass index is 38.88 kg/m. GEN: Well nourished, well developed, in no acute distress  HEENT: normal  Neck: no JVD, carotid bruits, or masses Cardiac: RRR; no murmurs, rubs, or gallops,no edema  Respiratory:  clear to auscultation bilaterally, normal work of breathing GI: soft, nontender, nondistended, + BS MS: no deformity or atrophy  Skin: warm and dry, no rash Neuro:  Strength and sensation are intact Psych: normal   EKG:  EKG is ordered today. The ekg ordered today demonstrates Nov. 28, 2017:  NSR , RBBB, Inf. MI   Recent Labs: 05/18/2016: Magnesium 2.3 05/21/2016: BUN 11; Creatinine, Ser 0.94; Potassium 3.8; Sodium 138 05/22/2016: Hemoglobin 15.6; Platelets 163    Lipid Panel    Component Value Date/Time   CHOL 183 05/19/2016 0514   TRIG 141 05/19/2016 0514   HDL 31 (L) 05/19/2016 0514   CHOLHDL 5.9 05/19/2016 0514   VLDL 28 05/19/2016 0514   LDLCALC 124 (H) 05/19/2016 0514      Wt Readings from Last 3 Encounters:  11/20/16 (!) 302 lb 12.8 oz (137.3 kg)  05/22/16 299 lb 12.8 oz (136 kg)  02/11/15 (!) 327 lb (148.3 kg)      Other studies Reviewed: Additional studies/ records that were reviewed today include: . Review of the above records demonstrates:    ASSESSMENT AND PLAN:  1.  Unstable angina: Brad Woods presents today with symptoms that are consistent with progressive unstable angina. He had stenting of his left circumflex artery in May. He now presents with episodes of  angina. These typically occur with exertion but last night seemed to occur more with rest. He does not have any acute EKG changes today.  The first available Cath Lab slot is on Thursday. We'll start him on isosorbide 30 mg a day. We'll have him go to the hospital if he has any further episodes of angina that do not resolve with nitroglycerin.  2. Hyperlipidemia: Continue current medications.   Current medicines are reviewed at length with the patient today.  The patient does not have concerns regarding medicines.  Labs/ tests ordered today include:  No orders  of the defined types were placed in this encounter.    Disposition:   FU with me in several months      Kristeen Miss, MD  11/20/2016 4:01 PM    [facility] [address] [place], [place], [place]  [address] Phone: [phone]; Fax: [phone]

## 2016-11-22 NOTE — Discharge Instructions (Signed)
Your heart catheterization showed blockages involving small arteries, similar to your catheterization in May.  The previously place stent appears to be functioning well.  Due to your symptoms, we would like to make a few medication changes:  Take tonight's dose of Brilinta, then stop this medication.  Tomorrow morning, take 300 mg of clopidogrel (4 tablets), followed by 75 mg every morning thereafter.  Increase isosorbide mononitrate to 60 mg daily; you can make this change in a few days after your headaches improve.  You can use acetaminophen as needed for headaches.  The headaches typically go away after your body has adjusted to the medication for a couple of days.  Please contact Dr. Elmarie Shiley office if any questions or concern arise before your follow-up appointment in January.  Radial Site Care Introduction Refer to this sheet in the next few weeks. These instructions provide you with information about caring for yourself after your procedure. Your health care provider may also give you more specific instructions. Your treatment has been planned according to current medical practices, but problems sometimes occur. Call your health care provider if you have any problems or questions after your procedure. What can I expect after the procedure? After your procedure, it is typical to have the following:  Bruising at the radial site that usually fades within 1-2 weeks.  Blood collecting in the tissue (hematoma) that may be painful to the touch. It should usually decrease in size and tenderness within 1-2 weeks. Follow these instructions at home:  Take medicines only as directed by your health care provider.  You may shower 24-48 hours after the procedure or as directed by your health care provider. Remove the bandage (dressing) and gently wash the site with plain soap and water. Pat the area dry with a clean towel. Do not rub the site, because this may cause bleeding.  Do not take baths,  swim, or use a hot tub until your health care provider approves.  Check your insertion site every day for redness, swelling, or drainage.  Do not apply powder or lotion to the site.  Do not flex or bend the affected arm for 24 hours or as directed by your health care provider.  Do not push or pull heavy objects with the affected arm for 24 hours or as directed by your health care provider.  Do not lift over 10 lb (4.5 kg) for 5 days after your procedure or as directed by your health care provider.  Ask your health care provider when it is okay to:  Return to work or school.  Resume usual physical activities or sports.  Resume sexual activity.  Do not drive home if you are discharged the same day as the procedure. Have someone else drive you.  You may drive 24 hours after the procedure unless otherwise instructed by your health care provider.  Do not operate machinery or power tools for 24 hours after the procedure.  If your procedure was done as an outpatient procedure, which means that you went home the same day as your procedure, a responsible adult should be with you for the first 24 hours after you arrive home.  Keep all follow-up visits as directed by your health care provider. This is important. Contact a health care provider if:  You have a fever.  You have chills.  You have increased bleeding from the radial site. Hold pressure on the site. Get help right away if:  You have unusual pain at the radial site.  You have redness, warmth, or swelling at the radial site.  You have drainage (other than a small amount of blood on the dressing) from the radial site.  The radial site is bleeding, and the bleeding does not stop after 30 minutes of holding steady pressure on the site.  Your arm or hand becomes pale, cool, tingly, or numb. This information is not intended to replace advice given to you by your health care provider. Make sure you discuss any questions you have  with your health care provider. Document Released: 01/12/2011 Document Revised: 05/17/2016 Document Reviewed: 06/28/2014  2017 [employer]

## 2016-11-22 NOTE — Interval H&P Note (Signed)
History and Physical Interval Note:  11/22/2016 6:58 AM  Brad Woods  has presented today for cardiac catheterization, with the diagnosis of coronary artery disease and unstable angina.  The various methods of treatment have been discussed with the patient and family. After consideration of risks, benefits and other options for treatment, the patient has consented to  Procedure(s): Left Heart Cath and Coronary Angiography (N/A) as a surgical intervention .  The patient's history has been reviewed, patient examined, no change in status, stable for surgery.  I have reviewed the patient's chart and labs.  Questions were answered to the patient's satisfaction.    Cath Lab Visit (complete for each Cath Lab visit)  Clinical Evaluation Leading to the Procedure:   ACS: Yes.    Non-ACS:    Anginal Classification: CCS IV  Anti-ischemic medical therapy: Maximal Therapy (2 or more classes of medications)  Non-Invasive Test Results: No non-invasive testing performed  Prior CABG: No previous CABG  Brad Woods

## 2016-11-25 DIAGNOSIS — I25110 Atherosclerotic heart disease of native coronary artery with unstable angina pectoris: Secondary | ICD-10-CM | POA: Insufficient documentation

## 2016-11-25 DIAGNOSIS — I25119 Atherosclerotic heart disease of native coronary artery with unspecified angina pectoris: Secondary | ICD-10-CM | POA: Insufficient documentation

## 2016-11-27 ENCOUNTER — Other Ambulatory Visit: Payer: Self-pay | Admitting: Internal Medicine

## 2016-11-27 DIAGNOSIS — I252 Old myocardial infarction: Secondary | ICD-10-CM

## 2016-11-27 DIAGNOSIS — I25118 Atherosclerotic heart disease of native coronary artery with other forms of angina pectoris: Secondary | ICD-10-CM

## 2016-12-20 ENCOUNTER — Telehealth (HOSPITAL_COMMUNITY): Payer: Self-pay | Admitting: *Deleted

## 2016-12-20 NOTE — Telephone Encounter (Signed)
Returning call from message left by pt daughter Orvilla Fus @ [phone].  Message left for pt daughter to please call back.  Provided contact information. Cherre Huger, BSN

## 2016-12-31 ENCOUNTER — Encounter: Payer: Self-pay | Admitting: Cardiovascular Disease

## 2016-12-31 ENCOUNTER — Encounter (INDEPENDENT_AMBULATORY_CARE_PROVIDER_SITE_OTHER): Payer: Self-pay

## 2016-12-31 ENCOUNTER — Ambulatory Visit (INDEPENDENT_AMBULATORY_CARE_PROVIDER_SITE_OTHER): Payer: PPO | Admitting: Cardiovascular Disease

## 2016-12-31 VITALS — BP 104/60 | HR 90 | Ht 74.0 in | Wt 302.0 lb

## 2016-12-31 DIAGNOSIS — E782 Mixed hyperlipidemia: Secondary | ICD-10-CM

## 2016-12-31 DIAGNOSIS — I2510 Atherosclerotic heart disease of native coronary artery without angina pectoris: Secondary | ICD-10-CM | POA: Diagnosis not present

## 2016-12-31 NOTE — Progress Notes (Signed)
Cardiology Office Note   Date:  12/31/2016   ID:  Brad Woods, DOB [birth year], MRN [id number]  PCP:  Joycelyn Rua, MD  Cardiologist:   Kristeen Miss, MD   Chief Complaint  Patient presents with  . Coronary Artery Disease      History of Present Illness: Brad Woods is a 72 y.o. male who presents for follow up of his CAD.   Seen with wife , Brad Woods  Has been having CP since May. Went to the [facility] , was sent to the hospital May 18, 2016 Was admitted,    At 2 AM , his CP worsened acutely .   Had a stent to his LCx.   ( 90% - 0%)   He wanted to change cardiologist and presents for further management .   Has been having CP similar to his presenting symptom in May. Seem to be worse with exertion, better with rest.   Had another episode of CP last night. Mid sternal CP, radiation down his left arm .  Took a SL NTG  - CP was relieved with NTG .   Has had some shortness of breath - possibly shortly after taking the Brilinta   Also has some belching relieves the CP    does not get any regular exercise. Before the MI, was going several times a week   [name]. 8, 2018:  Brad Woods had his heart catheterization since her last visit. He was found have moderate disease. His stents are patent. He did not require any additional stenting. He was changed from Brilinta to Plavix because of shortness of breath. He seems to be feeling a lot better after that medicine change. Wants to get back into cardiac rehab   Past Medical History:  Diagnosis Date  . Allergy   . Arthritis   . Asthma    when younger  . GERD (gastroesophageal reflux disease)   . Hyperlipidemia   . Hypertension   . Kidney stones   . Sleep apnea    cpap    Past Surgical History:  Procedure Laterality Date  . CARDIAC CATHETERIZATION N/A 05/20/2016   Procedure: Left Heart Cath and Coronary Angiography;  Surgeon: Runell Gess, MD;  Location: [facility] INVASIVE CV LAB;  Service: Cardiovascular;  Laterality: N/A;  .  CARDIAC CATHETERIZATION N/A 11/22/2016   Procedure: Left Heart Cath and Coronary Angiography;  Surgeon: Yvonne Kendall, MD;  Location: [facility] INVASIVE CV LAB;  Service: Cardiovascular;  Laterality: N/A;  . COLONOSCOPY    . EYE SURGERY    . RECTAL SURGERY    . REPLACEMENT TOTAL KNEE Bilateral      Current Outpatient Prescriptions  Medication Sig Dispense Refill  . albuterol (PROVENTIL HFA;VENTOLIN HFA) 108 (90 Base) MCG/ACT inhaler Inhale 2 puffs into the lungs every 6 (six) hours as needed for wheezing or shortness of breath. 1 Inhaler 2  . aspirin EC 81 MG EC tablet Take 1 tablet (81 mg total) by mouth daily.    Marland Kitchen atorvastatin (LIPITOR) 80 MG tablet Take 1 tablet (80 mg total) by mouth daily at 6 PM. 30 tablet 3  . carvedilol (COREG) 3.125 MG tablet Take 1 tablet (3.125 mg total) by mouth 2 (two) times daily with a meal. 60 tablet 3  . clopidogrel (PLAVIX) 75 MG tablet Take 1 tablet (75 mg total) by mouth daily. Take 4 tablets (300 mg) the first day, then 1 tablet (75 mg) daily thereafter. 90 tablet 3  . finasteride (PROSCAR) 5 MG tablet  Take 5 mg by mouth daily.    . fluticasone (FLONASE) 50 MCG/ACT nasal spray Place 2 sprays into the nose daily as needed for allergies.     Marland Kitchen ibuprofen (ADVIL,MOTRIN) 200 MG tablet Take 400 mg by mouth every 6 (six) hours as needed for mild pain.    . isosorbide mononitrate (IMDUR) 60 MG 24 hr tablet Take 1 tablet (60 mg total) by mouth daily. 30 tablet 11  . Multiple Vitamins-Minerals (CENTRUM SILVER 50+MEN PO) Take 1 tablet by mouth daily.    . nitroGLYCERIN (NITROSTAT) 0.4 MG SL tablet Place 0.4 mg under the tongue every 5 (five) minutes as needed for chest pain.    . tamsulosin (FLOMAX) 0.4 MG CAPS capsule Take 0.4 mg by mouth daily.      No current facility-administered medications for this visit.     Allergies:   Hydromorphone hcl    Social History:  The patient  reports that he has never smoked. He has never used smokeless tobacco. He reports that  he drinks alcohol. He reports that he does not use drugs.   Family History:  The patient's family history includes Colon cancer (age of onset: 64) in his mother.    ROS:  Please see the history of present illness.    Review of Systems: Constitutional:  denies fever, chills, diaphoresis, appetite change and fatigue.  HEENT: denies photophobia, eye pain, redness, hearing loss, ear pain, congestion, sore throat, rhinorrhea, sneezing, neck pain, neck stiffness and tinnitus.  Respiratory: denies SOB, DOE, cough, chest tightness, and wheezing.  Cardiovascular: admits to chest pain this afternoon and with exertion   Gastrointestinal: denies nausea, vomiting, abdominal pain, diarrhea, constipation, blood in stool.  Genitourinary: denies dysuria, urgency, frequency, hematuria, flank pain and difficulty urinating.  Musculoskeletal: denies  myalgias, back pain, joint swelling, arthralgias and gait problem.   Skin: denies pallor, rash and wound.  Neurological: denies dizziness, seizures, syncope, weakness, light-headedness, numbness and headaches.   Hematological: denies adenopathy, easy bruising, personal or family bleeding history.  Psychiatric/ Behavioral: denies suicidal ideation, mood changes, confusion, nervousness, sleep disturbance and agitation.       All other systems are reviewed and negative.    PHYSICAL EXAM: VS:  BP 104/60 (BP Location: Right Arm, Patient Position: Sitting, Cuff Size: Large)   Pulse 90   Ht 6\' 2"  (1.88 m)   Wt (!) 302 lb (137 kg)   SpO2 95%   BMI 38.77 kg/m  , BMI Body mass index is 38.77 kg/m. GEN: Well nourished, well developed, in no acute distress  HEENT: normal  Neck: no JVD, carotid bruits, or masses Cardiac: RRR; no murmurs, rubs, or gallops,no edema  Respiratory:  clear to auscultation bilaterally, normal work of breathing GI: soft, nontender, nondistended, + BS MS: no deformity or atrophy  Skin: warm and dry, no rash Neuro:  Strength and  sensation are intact Psych: normal   EKG:  EKG is ordered today. The ekg ordered today demonstrates Nov. 28, 2017:  NSR , RBBB, Inf. MI   Recent Labs: 05/18/2016: Magnesium 2.3 11/20/2016: ALT 20; BUN 15; Creat 0.96; Hemoglobin 15.7; Platelets 181; Potassium 4.1; Sodium 137    Lipid Panel    Component Value Date/Time   CHOL 136 11/20/2016 1643   TRIG 156 (H) 11/20/2016 1643   HDL 34 (L) 11/20/2016 1643   CHOLHDL 4.0 11/20/2016 1643   VLDL 31 (H) 11/20/2016 1643   LDLCALC 71 11/20/2016 1643      Wt Readings from Last 3  Encounters:  12/31/16 (!) 302 lb (137 kg)  11/22/16 300 lb (136.1 kg)  11/20/16 (!) 302 lb 12.8 oz (137.3 kg)      Other studies Reviewed: Additional studies/ records that were reviewed today include: . Review of the above records demonstrates:    ASSESSMENT AND PLAN:  1.  Unstable angina: Brad Woods seems To be feeling better. He has moderate coronary disease but his stents were patent at his heart catheter station a month ago. Isosorbide was added to his medical regimen. He seems to be doing better. He also is breathing better since the Brilinta was changed to Plavix.  I have encouraged him to work on a diet, exercise, and weight loss program. He is interested in doing cardiac rehabilitation. We will order that referral.  2. Hyperlipidemia: Continue current medications. His triglyceride level is mildly elevated. Continue current medications. This should improve with a better diet and weight loss. We will check labs in 6 puffs.  Current medicines are reviewed at length with the patient today.  The patient does not have concerns regarding medicines.  Labs/ tests ordered today include:  No orders of the defined types were placed in this encounter.    Disposition:   FU with me in 6  months      Kristeen Miss, MD  12/31/2016 9:11 AM    [facility] [address] [place], [place], [place]  [address] Phone: [phone]; Fax: [phone]

## 2016-12-31 NOTE — Patient Instructions (Addendum)
Medication Instructions:  Your physician recommends that you continue on your current medications as directed. Please refer to the Current Medication list given to you today.   Labwork: Your physician recommends that you return for lab work in: 6 months on the day of or a few days before your office visit with Dr. Acie Fredrickson.  You will need to FAST for this appointment - nothing to eat or drink after midnight the night before except water.   Testing/Procedures: You have been referred to Cardiac Rehab - they will contact you to schedule   Follow-Up: Your physician wants you to follow-up in: 6 months with Dr. Acie Fredrickson.  You will receive a reminder letter in the mail two months in advance. If you don't receive a letter, please call our office to schedule the follow-up appointment.   If you need a refill on your cardiac medications before your next appointment, please call your pharmacy.   Thank you for choosing [facility]! Christen Bame, RN [phone]

## 2017-01-01 ENCOUNTER — Inpatient Hospital Stay (HOSPITAL_COMMUNITY): Admission: RE | Admit: 2017-01-01 | Payer: PPO | Source: Ambulatory Visit

## 2017-01-07 ENCOUNTER — Encounter (HOSPITAL_COMMUNITY): Payer: PPO

## 2017-01-09 ENCOUNTER — Encounter (HOSPITAL_COMMUNITY): Payer: PPO

## 2017-01-10 ENCOUNTER — Inpatient Hospital Stay (HOSPITAL_COMMUNITY): Admission: RE | Admit: 2017-01-10 | Payer: PPO | Source: Ambulatory Visit

## 2017-01-11 ENCOUNTER — Encounter (HOSPITAL_COMMUNITY): Payer: PPO

## 2017-01-14 ENCOUNTER — Encounter (HOSPITAL_COMMUNITY): Payer: PPO

## 2017-01-16 ENCOUNTER — Encounter (HOSPITAL_COMMUNITY): Payer: PPO

## 2017-01-17 ENCOUNTER — Encounter (HOSPITAL_COMMUNITY): Payer: Self-pay

## 2017-01-17 ENCOUNTER — Encounter (HOSPITAL_COMMUNITY)
Admission: RE | Admit: 2017-01-17 | Discharge: 2017-01-17 | Disposition: A | Discharge status: Home / Self Care | Payer: PPO | Source: Ambulatory Visit | Attending: Cardiovascular Disease | Admitting: Cardiovascular Disease

## 2017-01-17 VITALS — BP 136/68 | HR 89 | Ht 73.0 in | Wt 303.8 lb

## 2017-01-17 DIAGNOSIS — I214 Non-ST elevation (NSTEMI) myocardial infarction: Secondary | ICD-10-CM | POA: Diagnosis not present

## 2017-01-17 DIAGNOSIS — Z955 Presence of coronary angioplasty implant and graft: Secondary | ICD-10-CM | POA: Diagnosis not present

## 2017-01-17 NOTE — Progress Notes (Signed)
Cardiac Rehab Medication Review by a Pharmacist  Does the patient  feel that his/her medications are working for him/her?  yes  Has the patient been experiencing any side effects to the medications prescribed?  Yes, patient states he has leg cramps in groin and calf muscles 3 to 4 times per week since starting atorvastatin.   Does the patient measure his/her own blood pressure or blood glucose at home?  Yes, but not consistently. Informed him to check at least 3x per week  Does the patient have any problems obtaining medications due to transportation or finances?   no  Understanding of regimen: good Understanding of indications: good Potential of compliance: good   Pharmacist comments: Brad Woods is a 16 yoM who presents in good spirits. He currently takes all of his medications, but describes leg cramps starting soon after starting lipitor. He also has a pmh significant for osteoarthritis and bursitis, for which he takes motrin daily. He was counseled on reasons to use tylenol over motrin (cardiotoxicty and minimal inflammation in osteoarthritis), as well as to check his blood pressure as he will not feel hyper- or hypotension until it is severe. The nurse has been informed of the leg cramps and I informed the patient to contact his cardiologist with the information.   Arminger, Broadus Telly 01/17/2017 8:08 AM

## 2017-01-17 NOTE — Progress Notes (Signed)
Cardiac Individual Treatment Plan  Patient Details  Name: Brad Woods MRN: [id number] Date of Birth: 08-13-1946 Referring Provider:   Flowsheet Row CARDIAC REHAB PHASE II ORIENTATION from 01/17/2017 in [facility] CARDIAC [facility]  Referring Provider  Nahser, Deloris Ping MD      Initial Encounter Date:  Flowsheet Row CARDIAC REHAB PHASE II ORIENTATION from 01/17/2017 in [facility] CARDIAC REHAB  Date  01/17/17  Referring Provider  Nahser, Deloris Ping MD      Visit Diagnosis: 05/18/16 NSTEMI (non-ST elevated myocardial infarction) (HCC)  05/20/16 Status post coronary artery stent placement  Patient's Home Medications on Admission:  Current Outpatient Prescriptions:  .  aspirin EC 81 MG EC tablet, Take 1 tablet (81 mg total) by mouth daily., Disp: , Rfl:  .  atorvastatin (LIPITOR) 80 MG tablet, Take 1 tablet (80 mg total) by mouth daily at 6 PM., Disp: 30 tablet, Rfl: 3 .  carvedilol (COREG) 3.125 MG tablet, Take 1 tablet (3.125 mg total) by mouth 2 (two) times daily with a meal., Disp: 60 tablet, Rfl: 3 .  clopidogrel (PLAVIX) 75 MG tablet, Take 1 tablet (75 mg total) by mouth daily. Take 4 tablets (300 mg) the first day, then 1 tablet (75 mg) daily thereafter., Disp: 90 tablet, Rfl: 3 .  finasteride (PROSCAR) 5 MG tablet, Take 5 mg by mouth daily., Disp: , Rfl:  .  ibuprofen (ADVIL,MOTRIN) 200 MG tablet, Take 400 mg by mouth every 6 (six) hours as needed for mild pain., Disp: , Rfl:  .  isosorbide mononitrate (IMDUR) 60 MG 24 hr tablet, Take 1 tablet (60 mg total) by mouth daily., Disp: 30 tablet, Rfl: 11 .  Multiple Vitamins-Minerals (CENTRUM SILVER 50+MEN PO), Take 1 tablet by mouth daily., Disp: , Rfl:  .  nitroGLYCERIN (NITROSTAT) 0.4 MG SL tablet, Place 0.4 mg under the tongue every 5 (five) minutes as needed for chest pain., Disp: , Rfl:  .  tamsulosin (FLOMAX) 0.4 MG CAPS capsule, Take 0.4 mg by mouth daily. , Disp: , Rfl:  .  albuterol (PROVENTIL  HFA;VENTOLIN HFA) 108 (90 Base) MCG/ACT inhaler, Inhale 2 puffs into the lungs every 6 (six) hours as needed for wheezing or shortness of breath. (Patient not taking: Reported on 01/17/2017), Disp: 1 Inhaler, Rfl: 2 .  fluticasone (FLONASE) 50 MCG/ACT nasal spray, Place 2 sprays into the nose daily as needed for allergies. , Disp: , Rfl:   Past Medical History: Past Medical History:  Diagnosis Date  . Allergy   . Arthritis   . Asthma    when younger  . GERD (gastroesophageal reflux disease)   . Hyperlipidemia   . Hypertension   . Kidney stones   . Sleep apnea    cpap    Tobacco Use: History  Smoking Status  . Never Smoker  Smokeless Tobacco  . Never Used    Labs: Recent Review Flowsheet Data    Labs for ITP Cardiac and Pulmonary Rehab Latest Ref Rng & Units 05/19/2016 11/20/2016   Cholestrol <200 mg/dL 782 956   LDLCALC <213 mg/dL 086(V) 71   HDL >78 mg/dL 46(N) 62(X)   Trlycerides <150 mg/dL 528 413(K)      Capillary Blood Glucose: No results found for: GLUCAP   Exercise Target Goals: Date: 01/17/17  Exercise Program Goal: Individual exercise prescription set with THRR, safety & activity barriers. Participant demonstrates ability to understand and report RPE using BORG scale, to self-measure pulse accurately, and to acknowledge the importance  of the exercise prescription.  Exercise Prescription Goal: Starting with aerobic activity 30 plus minutes a day, 3 days per week for initial exercise prescription. Provide home exercise prescription and guidelines that participant acknowledges understanding prior to discharge.  Activity Barriers & Risk Stratification:     Activity Barriers & Cardiac Risk Stratification - 01/17/17 0846      Activity Barriers & Cardiac Risk Stratification   Activity Barriers Left Knee Replacement;Right Knee Replacement;Other (comment);Deconditioning;Joint Problems;Muscular Weakness   Comments B hip pain R>L   Cardiac Risk Stratification High       6 Minute Walk:     6 Minute Walk    Row Name 01/17/17 0939 01/17/17 1144       6 Minute Walk   Phase Initial  -    Distance 1536 feet  -    Walk Time 6 minutes  -    # of Rest Breaks 0  -    MPH  - 2.9    METS  - 2.7    RPE 12  -    VO2 Peak  - 9.47    Symptoms Yes (comment)  -    Comments R hip pain 2-3/10 B knee pain 2/10  -    Resting HR 89 bpm  -    Resting BP 136/68  -    Max Ex. HR  - 100 bpm    Max Ex. BP 146/72  -    2 Minute Post BP  - 118/66       Initial Exercise Prescription:     Initial Exercise Prescription - 01/17/17 1100      Date of Initial Exercise RX and Referring Provider   Date 01/17/17   Referring Provider Nahser, Deloris Ping MD     Treadmill   MPH 1.7   Grade 0   Minutes 10   METs 2.3     Bike   Level 0.5   Minutes 10   METs 1.84     NuStep   Level 2   Minutes 10   METs 1.5     Arm Ergometer   Level 1   Minutes 10   METs 1.5     Prescription Details   Frequency (times per week) 3   Duration Progress to 30 minutes of continuous aerobic without signs/symptoms of physical distress     Intensity   THRR 40-80% of Max Heartrate 60-1221   Ratings of Perceived Exertion 11-13   Perceived Dyspnea 0-4     Progression   Progression Continue to progress workloads to maintain intensity without signs/symptoms of physical distress.     Resistance Training   Training Prescription Yes   Weight 2lbs   Reps 10-12      Perform Capillary Blood Glucose checks as needed.  Exercise Prescription Changes:   Exercise Comments:   Discharge Exercise Prescription (Final Exercise Prescription Changes):   Nutrition:  Target Goals: Understanding of nutrition guidelines, daily intake of sodium 1500mg , cholesterol 200mg , calories 30% from fat and 7% or less from saturated fats, daily to have 5 or more servings of fruits and vegetables.  Biometrics:     Pre Biometrics - 01/17/17 1148      Pre Biometrics   % Body Fat 40.5 %        Nutrition Therapy Plan and Nutrition Goals:   Nutrition Discharge: Nutrition Scores:   Nutrition Goals Re-Evaluation:   Psychosocial: Target Goals: Acknowledge presence or absence of depression, maximize coping skills, provide positive support  system. Participant is able to verbalize types and ability to use techniques and skills needed for reducing stress and depression.  Initial Review & Psychosocial Screening:     Initial Psych Review & Screening - 01/17/17 1405      Family Dynamics   Good Support System? Yes     Barriers   Psychosocial barriers to participate in program There are no identifiable barriers or psychosocial needs.      Quality of Life Scores:     Quality of Life - 01/17/17 1155      Quality of Life Scores   Health/Function Pre 18.73 %   Socioeconomic Pre 23.56 %   Psych/Spiritual Pre 22.29 %   Family Pre 24 %   GLOBAL Pre 21.3 %      PHQ-9: Recent Review Flowsheet Data    There is no flowsheet data to display.      Psychosocial Evaluation and Intervention:   Psychosocial Re-Evaluation:   Vocational Rehabilitation: Provide vocational rehab assistance to qualifying candidates.   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 Participant will state understanding/return demonstration of topics presented.  Learning Barriers/Preferences:     Learning Barriers/Preferences - 01/17/17 0847      Learning Barriers/Preferences   Learning Barriers Sight   Learning Preferences Video;Written Material;Computer/Internet      Education Topics: Count Your Pulse:  -Group instruction provided by verbal instruction,  demonstration, patient participation and written materials to support subject.  Instructors address importance of being able to find your pulse and how to count your pulse when at home without a heart monitor.  Patients get hands on experience counting their pulse with staff help and individually.   Heart Attack, Angina, and Risk Factor Modification:  -Group instruction provided by verbal instruction, video, and written materials to support subject.  Instructors address signs and symptoms of angina and heart attacks.    Also discuss risk factors for heart disease and how to make changes to improve heart health risk factors.   Functional Fitness:  -Group instruction provided by verbal instruction, demonstration, patient participation, and written materials to support subject.  Instructors address safety measures for doing things around the house.  Discuss how to get up and down off the floor, how to pick things up properly, how to safely get out of a chair without assistance, and balance training.   Meditation and Mindfulness:  -Group instruction provided by verbal instruction, patient participation, and written materials to support subject.  Instructor addresses importance of mindfulness and meditation practice to help reduce stress and improve awareness.  Instructor also leads participants through a meditation exercise.    Stretching for Flexibility and Mobility:  -Group instruction provided by verbal instruction, patient participation, and written materials to support subject.  Instructors lead participants through series of stretches that are designed to increase flexibility thus improving mobility.  These stretches are additional exercise for major muscle groups that are typically performed during regular warm up and cool down.   Hands Only CPR Anytime:  -Group instruction provided by verbal instruction, video, patient participation and written materials to support subject.  Instructors  co-teach with AHA video for hands only CPR.  Participants  get hands on experience with mannequins.   Nutrition I class: Heart Healthy Eating:  -Group instruction provided by PowerPoint slides, verbal discussion, and written materials to support subject matter. The instructor gives an explanation and review of the Therapeutic Lifestyle Changes diet recommendations, which includes a discussion on lipid goals, dietary fat, sodium, fiber, plant stanol/sterol esters, sugar, and the components of a well-balanced, healthy diet.   Nutrition II class: Lifestyle Skills:  -Group instruction provided by PowerPoint slides, verbal discussion, and written materials to support subject matter. The instructor gives an explanation and review of label reading, grocery shopping for heart health, heart healthy recipe modifications, and ways to make healthier choices when eating out.   Diabetes Question & Answer:  -Group instruction provided by PowerPoint slides, verbal discussion, and written materials to support subject matter. The instructor gives an explanation and review of diabetes co-morbidities, pre- and post-prandial blood glucose goals, pre-exercise blood glucose goals, signs, symptoms, and treatment of hypoglycemia and hyperglycemia, and foot care basics.   Diabetes Blitz:  -Group instruction provided by PowerPoint slides, verbal discussion, and written materials to support subject matter. The instructor gives an explanation and review of the physiology behind type 1 and type 2 diabetes, diabetes medications and rational behind using different medications, pre- and post-prandial blood glucose recommendations and Hemoglobin A1c goals, diabetes diet, and exercise including blood glucose guidelines for exercising safely.    Portion Distortion:  -Group instruction provided by PowerPoint slides, verbal discussion, written materials, and food models to support subject matter. The instructor gives an explanation of  serving size versus portion size, changes in portions sizes over the last 20 years, and what consists of a serving from each food group.   Stress Management:  -Group instruction provided by verbal instruction, video, and written materials to support subject matter.  Instructors review role of stress in heart disease and how to cope with stress positively.     Exercising on Your Own:  -Group instruction provided by verbal instruction, power point, and written materials to support subject.  Instructors discuss benefits of exercise, components of exercise, frequency and intensity of exercise, and end points for exercise.  Also discuss use of nitroglycerin and activating EMS.  Review options of places to exercise outside of rehab.  Review guidelines for sex with heart disease.   Cardiac Drugs I:  -Group instruction provided by verbal instruction and written materials to support subject.  Instructor reviews cardiac drug classes: antiplatelets, anticoagulants, beta blockers, and statins.  Instructor discusses reasons, side effects, and lifestyle considerations for each drug class.   Cardiac Drugs II:  -Group instruction provided by verbal instruction and written materials to support subject.  Instructor reviews cardiac drug classes: angiotensin converting enzyme inhibitors (ACE-I), angiotensin II receptor blockers (ARBs), nitrates, and calcium channel blockers.  Instructor discusses reasons, side effects, and lifestyle considerations for each drug class.   Anatomy and Physiology of the Circulatory System:  -Group instruction provided by verbal instruction, video, and written materials to support subject.  Reviews functional anatomy of heart, how it relates to various diagnoses, and what role the heart plays in the overall system.   Knowledge Questionnaire Score:     Knowledge Questionnaire Score - 01/17/17 1142      Knowledge Questionnaire Score   Pre Score 24/24      Core Components/Risk  Factors/Patient Goals at Admission:     Personal Goals and Risk Factors at Admission - 01/17/17 1425      Core Components/Risk Factors/Patient Goals on  Admission   Improve shortness of breath with ADL's Yes   Intervention Provide education, individualized exercise plan and daily activity instruction to help decrease symptoms of SOB with activities of daily living.   Expected Outcomes Short Term: Achieves a reduction of symptoms when performing activities of daily living.   Develop more efficient breathing techniques such as purse lipped breathing and diaphragmatic breathing; and practicing self-pacing with activity Yes   Intervention Provide education, demonstration and support about specific breathing techniuqes utilized for more efficient breathing. Include techniques such as pursed lipped breathing, diaphragmatic breathing and self-pacing activity.   Expected Outcomes Short Term: Participant will be able to demonstrate and use breathing techniques as needed throughout daily activities.      Core Components/Risk Factors/Patient Goals Review:    Core Components/Risk Factors/Patient Goals at Discharge (Final Review):    ITP Comments:     ITP Comments    Row Name 01/17/17 0843           ITP Comments Dr. Armanda Magic. Medical Director          Comments:  Pt in today for his orientation appointment for cardiac rehab phase II.  As a part of the orientation appt pt completed 5 minute of stretching and 6 minute walk test.  Pt arrives to his appt alone. Pt noted to be short of breath.  Pt indicated the walk was difficult for him.  Advised pt to seek assistance by using a wheelchair.  Pt shortness of breath resolved with rest.  Pt completed 6 minute walk test.  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  Brief Psychosocial Assessment completed with pt. Pt with supportive family which includes spouse and children.  Pt presents with no identifiable needs at this time.  Pt is looking forward to participating in full exercise next week learning more about diet and exercise routine. Alanson Aly, BSN

## 2017-01-18 ENCOUNTER — Encounter (HOSPITAL_COMMUNITY): Payer: PPO

## 2017-01-18 DIAGNOSIS — L570 Actinic keratosis: Secondary | ICD-10-CM | POA: Diagnosis not present

## 2017-01-18 DIAGNOSIS — L568 Other specified acute skin changes due to ultraviolet radiation: Secondary | ICD-10-CM | POA: Diagnosis not present

## 2017-01-18 DIAGNOSIS — Z85828 Personal history of other malignant neoplasm of skin: Secondary | ICD-10-CM | POA: Diagnosis not present

## 2017-01-21 ENCOUNTER — Encounter (HOSPITAL_COMMUNITY): Payer: PPO

## 2017-01-21 ENCOUNTER — Encounter (HOSPITAL_COMMUNITY): Payer: Self-pay

## 2017-01-23 ENCOUNTER — Encounter (HOSPITAL_COMMUNITY): Payer: PPO

## 2017-01-23 ENCOUNTER — Encounter (HOSPITAL_COMMUNITY): Payer: Self-pay

## 2017-01-23 ENCOUNTER — Encounter (HOSPITAL_COMMUNITY)
Admission: RE | Admit: 2017-01-23 | Discharge: 2017-01-23 | Disposition: A | Discharge status: Home / Self Care | Payer: PPO | Source: Ambulatory Visit | Attending: Cardiovascular Disease | Admitting: Cardiovascular Disease

## 2017-01-23 DIAGNOSIS — I214 Non-ST elevation (NSTEMI) myocardial infarction: Secondary | ICD-10-CM

## 2017-01-23 DIAGNOSIS — Z955 Presence of coronary angioplasty implant and graft: Secondary | ICD-10-CM

## 2017-01-23 NOTE — Progress Notes (Signed)
Cardiac Individual Treatment Plan  Patient Details  Name: Brad Woods MRN: [id number] Date of Birth: [birth year] Referring Provider:   Flowsheet Row CARDIAC REHAB PHASE II ORIENTATION from 01/17/2017 in [facility] CARDIAC [facility]  Referring Provider  Nahser, Deloris Ping MD      Initial Encounter Date:  Flowsheet Row CARDIAC REHAB PHASE II ORIENTATION from 01/17/2017 in [facility] CARDIAC REHAB  Date  01/17/17  Referring Provider  Nahser, Deloris Ping MD      Visit Diagnosis: 05/18/16 NSTEMI (non-ST elevated myocardial infarction) (HCC)  05/20/16 Status post coronary artery stent placement  Patient's Home Medications on Admission:  Current Outpatient Prescriptions:  .  acetaminophen (TYLENOL) 500 MG tablet, Take 500 mg by mouth every 6 (six) hours as needed., Disp: , Rfl:  .  aspirin EC 81 MG EC tablet, Take 1 tablet (81 mg total) by mouth daily., Disp: , Rfl:  .  atorvastatin (LIPITOR) 80 MG tablet, Take 1 tablet (80 mg total) by mouth daily at 6 PM., Disp: 30 tablet, Rfl: 3 .  carvedilol (COREG) 3.125 MG tablet, Take 1 tablet (3.125 mg total) by mouth 2 (two) times daily with a meal., Disp: 60 tablet, Rfl: 3 .  clopidogrel (PLAVIX) 75 MG tablet, Take 1 tablet (75 mg total) by mouth daily. Take 4 tablets (300 mg) the first day, then 1 tablet (75 mg) daily thereafter., Disp: 90 tablet, Rfl: 3 .  finasteride (PROSCAR) 5 MG tablet, Take 5 mg by mouth daily., Disp: , Rfl:  .  fluticasone (FLONASE) 50 MCG/ACT nasal spray, Place 2 sprays into the nose daily as needed for allergies. , Disp: , Rfl:  .  isosorbide mononitrate (IMDUR) 60 MG 24 hr tablet, Take 1 tablet (60 mg total) by mouth daily., Disp: 30 tablet, Rfl: 11 .  Multiple Vitamins-Minerals (CENTRUM SILVER 50+MEN PO), Take 1 tablet by mouth daily., Disp: , Rfl:  .  nitroGLYCERIN (NITROSTAT) 0.4 MG SL tablet, Place 0.4 mg under the tongue every 5 (five) minutes as needed for chest pain., Disp: , Rfl:  .   tamsulosin (FLOMAX) 0.4 MG CAPS capsule, Take 0.4 mg by mouth daily. , Disp: , Rfl:  .  albuterol (PROVENTIL HFA;VENTOLIN HFA) 108 (90 Base) MCG/ACT inhaler, Inhale 2 puffs into the lungs every 6 (six) hours as needed for wheezing or shortness of breath. (Patient not taking: Reported on 01/23/2017), Disp: 1 Inhaler, Rfl: 2 .  ibuprofen (ADVIL,MOTRIN) 200 MG tablet, Take 400 mg by mouth every 6 (six) hours as needed for mild pain., Disp: , Rfl:   Past Medical History: Past Medical History:  Diagnosis Date  . Allergy   . Arthritis   . Asthma    when younger  . GERD (gastroesophageal reflux disease)   . Hyperlipidemia   . Hypertension   . Kidney stones   . Sleep apnea    cpap    Tobacco Use: History  Smoking Status  . Never Smoker  Smokeless Tobacco  . Never Used    Labs: Recent Review Flowsheet Data    Labs for ITP Cardiac and Pulmonary Rehab Latest Ref Rng & Units 05/19/2016 11/20/2016   Cholestrol <200 mg/dL 629 528   LDLCALC <413 mg/dL 244(W) 71   HDL >10 mg/dL 27(O) 53(G)   Trlycerides <150 mg/dL 644 034(V)      Capillary Blood Glucose: No results found for: GLUCAP   Exercise Target Goals:    Exercise Program Goal: Individual exercise prescription set with THRR, safety &  activity barriers. Participant demonstrates ability to understand and report RPE using BORG scale, to self-measure pulse accurately, and to acknowledge the importance of the exercise prescription.  Exercise Prescription Goal: Starting with aerobic activity 30 plus minutes a day, 3 days per week for initial exercise prescription. Provide home exercise prescription and guidelines that participant acknowledges understanding prior to discharge.  Activity Barriers & Risk Stratification:     Activity Barriers & Cardiac Risk Stratification - 01/17/17 0846      Activity Barriers & Cardiac Risk Stratification   Activity Barriers Left Knee Replacement;Right Knee Replacement;Other  (comment);Deconditioning;Joint Problems;Muscular Weakness   Comments B hip pain R>L   Cardiac Risk Stratification High      6 Minute Walk:     6 Minute Walk    Row Name 01/17/17 0939 01/17/17 1144       6 Minute Walk   Phase Initial  -    Distance 1536 feet  -    Walk Time 6 minutes  -    # of Rest Breaks 0  -    MPH  - 2.9    METS  - 2.7    RPE 12  -    VO2 Peak  - 9.47    Symptoms Yes (comment)  -    Comments R hip pain 2-3/10 B knee pain 2/10  -    Resting HR 89 bpm  -    Resting BP 136/68  -    Max Ex. HR  - 100 bpm    Max Ex. BP 146/72  -    2 Minute Post BP  - 118/66       Initial Exercise Prescription:     Initial Exercise Prescription - 01/17/17 1100      Date of Initial Exercise RX and Referring Provider   Date 01/17/17   Referring Provider Nahser, Deloris Ping MD     Treadmill   MPH 1.7   Grade 0   Minutes 10   METs 2.3     Bike   Level 0.5   Minutes 10   METs 1.84     NuStep   Level 2   Minutes 10   METs 1.5     Arm Ergometer   Level 1   Minutes 10   METs 1.5     Prescription Details   Frequency (times per week) 3   Duration Progress to 30 minutes of continuous aerobic without signs/symptoms of physical distress     Intensity   THRR 40-80% of Max Heartrate 60-1221   Ratings of Perceived Exertion 11-13   Perceived Dyspnea 0-4     Progression   Progression Continue to progress workloads to maintain intensity without signs/symptoms of physical distress.     Resistance Training   Training Prescription Yes   Weight 2lbs   Reps 10-12      Perform Capillary Blood Glucose checks as needed.  Exercise Prescription Changes:   Exercise Comments:   Discharge Exercise Prescription (Final Exercise Prescription Changes):   Nutrition:  Target Goals: Understanding of nutrition guidelines, daily intake of sodium 1500mg , cholesterol 200mg , calories 30% from fat and 7% or less from saturated fats, daily to have 5 or more servings of  fruits and vegetables.  Biometrics:     Pre Biometrics - 01/17/17 1148      Pre Biometrics   % Body Fat 40.5 %       Nutrition Therapy Plan and Nutrition Goals:     Nutrition Therapy &  Goals - 01/23/17 1610      Nutrition Therapy   Diet --  pt given diet record worksheet to complete      Nutrition Discharge: Nutrition Scores:     Nutrition Assessments - 01/21/17 1059      MEDFICTS Scores   Pre Score 50      Nutrition Goals Re-Evaluation:   Psychosocial: Target Goals: Acknowledge presence or absence of depression, maximize coping skills, provide positive support system. Participant is able to verbalize types and ability to use techniques and skills needed for reducing stress and depression.  Initial Review & Psychosocial Screening:     Initial Psych Review & Screening - 01/23/17 1526      Family Dynamics   Good Support System? --  spouse, family     Barriers   Psychosocial barriers to participate in program There are no identifiable barriers or psychosocial needs.      Quality of Life Scores:     Quality of Life - 01/17/17 1155      Quality of Life Scores   Health/Function Pre 18.73 %   Socioeconomic Pre 23.56 %   Psych/Spiritual Pre 22.29 %   Family Pre 24 %   GLOBAL Pre 21.3 %      PHQ-9: Recent Review Flowsheet Data    Depression screen [facility] 2/9 01/23/2017   Decreased Interest 0   Down, Depressed, Hopeless 0   PHQ - 2 Score 0      Psychosocial Evaluation and Intervention:     Psychosocial Evaluation - 01/23/17 0901      Psychosocial Evaluation & Interventions   Interventions Encouraged to exercise with the program and follow exercise prescription   Continued Psychosocial Services Needed No      Psychosocial Re-Evaluation:   Vocational Rehabilitation: Provide vocational rehab assistance to qualifying candidates.   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 Participant will state understanding/return demonstration of topics presented.  Learning Barriers/Preferences:     Learning Barriers/Preferences - 01/17/17 0847      Learning Barriers/Preferences   Learning Barriers Sight   Learning Preferences Video;Written Material;Computer/Internet      Education Topics: Count Your Pulse:  -Group instruction provided by verbal instruction, demonstration, patient participation and written materials to support subject.  Instructors address importance of being able to find your pulse and how to count your pulse when at home without a heart monitor.  Patients get hands on experience counting their pulse with staff help and individually.   Heart Attack, Angina, and Risk Factor Modification:  -Group instruction provided by verbal instruction, video, and written materials to support subject.  Instructors address signs and symptoms of angina and heart attacks.    Also discuss risk factors for heart disease and how to make changes to improve heart health risk factors.   Functional Fitness:  -Group instruction provided by verbal instruction, demonstration, patient participation, and written materials to support subject.  Instructors address safety measures for doing things around the house.  Discuss how to get up and down off the floor, how to pick things up properly, how to safely get out of a chair without assistance, and balance training.   Meditation  and Mindfulness:  -Group instruction provided by verbal instruction, patient participation, and written materials to support subject.  Instructor addresses importance of mindfulness and meditation practice to help reduce stress and improve awareness.   Instructor also leads participants through a meditation exercise.    Stretching for Flexibility and Mobility:  -Group instruction provided by verbal instruction, patient participation, and written materials to support subject.  Instructors lead participants through series of stretches that are designed to increase flexibility thus improving mobility.  These stretches are additional exercise for major muscle groups that are typically performed during regular warm up and cool down.   Hands Only CPR Anytime:  -Group instruction provided by verbal instruction, video, patient participation and written materials to support subject.  Instructors co-teach with AHA video for hands only CPR.  Participants get hands on experience with mannequins.   Nutrition I class: Heart Healthy Eating:  -Group instruction provided by PowerPoint slides, verbal discussion, and written materials to support subject matter. The instructor gives an explanation and review of the Therapeutic Lifestyle Changes diet recommendations, which includes a discussion on lipid goals, dietary fat, sodium, fiber, plant stanol/sterol esters, sugar, and the components of a well-balanced, healthy diet.   Nutrition II class: Lifestyle Skills:  -Group instruction provided by PowerPoint slides, verbal discussion, and written materials to support subject matter. The instructor gives an explanation and review of label reading, grocery shopping for heart health, heart healthy recipe modifications, and ways to make healthier choices when eating out.   Diabetes Question & Answer:  -Group instruction provided by PowerPoint slides, verbal discussion, and written materials to support subject matter. The instructor gives an explanation and review of diabetes co-morbidities, pre- and post-prandial blood glucose goals, pre-exercise blood glucose goals, signs, symptoms, and treatment of hypoglycemia and hyperglycemia, and foot care basics.   Diabetes  Blitz:  -Group instruction provided by PowerPoint slides, verbal discussion, and written materials to support subject matter. The instructor gives an explanation and review of the physiology behind type 1 and type 2 diabetes, diabetes medications and rational behind using different medications, pre- and post-prandial blood glucose recommendations and Hemoglobin A1c goals, diabetes diet, and exercise including blood glucose guidelines for exercising safely.    Portion Distortion:  -Group instruction provided by PowerPoint slides, verbal discussion, written materials, and food models to support subject matter. The instructor gives an explanation of serving size versus portion size, changes in portions sizes over the last 20 years, and what consists of a serving from each food group.   Stress Management:  -Group instruction provided by verbal instruction, video, and written materials to support subject matter.  Instructors review role of stress in heart disease and how to cope with stress positively.     Exercising on Your Own:  -Group instruction provided by verbal instruction, power point, and written materials to support subject.  Instructors discuss benefits of exercise, components of exercise, frequency and intensity of exercise, and end points for exercise.  Also discuss use of nitroglycerin and activating EMS.  Review options of places to exercise outside of rehab.  Review guidelines for sex with heart disease.   Cardiac Drugs I:  -Group instruction provided by verbal instruction and written materials to support subject.  Instructor reviews cardiac drug classes: antiplatelets, anticoagulants, beta blockers, and statins.  Instructor discusses reasons, side effects, and lifestyle considerations for each drug class.   Cardiac Drugs II:  -Group instruction provided by verbal instruction and written materials to support subject.  Instructor reviews cardiac drug  classes: angiotensin converting  enzyme inhibitors (ACE-I), angiotensin II receptor blockers (ARBs), nitrates, and calcium channel blockers.  Instructor discusses reasons, side effects, and lifestyle considerations for each drug class.   Anatomy and Physiology of the Circulatory System:  -Group instruction provided by verbal instruction, video, and written materials to support subject.  Reviews functional anatomy of heart, how it relates to various diagnoses, and what role the heart plays in the overall system.   Knowledge Questionnaire Score:     Knowledge Questionnaire Score - 01/17/17 1142      Knowledge Questionnaire Score   Pre Score 24/24      Core Components/Risk Factors/Patient Goals at Admission:     Personal Goals and Risk Factors at Admission - 01/17/17 1425      Core Components/Risk Factors/Patient Goals on Admission   Improve shortness of breath with ADL's Yes   Intervention Provide education, individualized exercise plan and daily activity instruction to help decrease symptoms of SOB with activities of daily living.   Expected Outcomes Short Term: Achieves a reduction of symptoms when performing activities of daily living.   Develop more efficient breathing techniques such as purse lipped breathing and diaphragmatic breathing; and practicing self-pacing with activity Yes   Intervention Provide education, demonstration and support about specific breathing techniuqes utilized for more efficient breathing. Include techniques such as pursed lipped breathing, diaphragmatic breathing and self-pacing activity.   Expected Outcomes Short Term: Participant will be able to demonstrate and use breathing techniques as needed throughout daily activities.      Core Components/Risk Factors/Patient Goals Review:    Core Components/Risk Factors/Patient Goals at Discharge (Final Review):    ITP Comments:     ITP Comments    Row Name 01/17/17 0843           ITP Comments Dr. Armanda Magic. Medical Director           Comments: Pt is making expected progress toward personal goals after completing 2 sessions. Recommend continued exercise and life style modification education including  stress management and relaxation techniques to decrease cardiac risk profile.

## 2017-01-23 NOTE — Progress Notes (Signed)
Daily Session Note  Patient Details  Name: Brad Woods MRN: [id number] Date of Birth: 06/20/1946 Referring Provider:   Flowsheet Row CARDIAC REHAB PHASE II ORIENTATION from 01/17/2017 in [place]  Referring Provider  Nahser, Wonda Cheng MD      Encounter Date: 01/23/2017  Check In:   Capillary Blood Glucose: No results found for this or any previous visit (from the past 24 hour(s)).   Goals Met:  Exercise tolerated well  Goals Unmet:  Not Applicable  Comments: Pt started cardiac rehab today.  Pt tolerated light exercise without difficulty. VSS, telemetry-sinus rhythm,  asymptomatic.  Medication list reconciled. Pt denies barriers to medicaiton compliance.  PSYCHOSOCIAL ASSESSMENT:  PHQ-0. Pt exhibits positive coping skills, hopeful outlook with supportive family. No psychosocial needs identified at this time, no psychosocial interventions necessary.    Pt enjoys eating, woodworking activities and remodeling projects.  Pt goal for cardiac rehab is weight loss with a personal goal of 50lb.  Pt encouraged to attend nutrition classes to optimize his ability to reach this goal.    Pt oriented to exercise equipment and routine.    Understanding verbalized.   Dr. Fransico Him is Medical Director for Cardiac Rehab at [facility].

## 2017-01-25 ENCOUNTER — Encounter (HOSPITAL_COMMUNITY)
Admission: RE | Admit: 2017-01-25 | Discharge: 2017-01-25 | Disposition: A | Discharge status: Home / Self Care | Payer: PPO | Source: Ambulatory Visit | Attending: Cardiovascular Disease | Admitting: Cardiovascular Disease

## 2017-01-25 ENCOUNTER — Encounter (HOSPITAL_COMMUNITY): Payer: PPO

## 2017-01-25 VITALS — Wt 300.5 lb

## 2017-01-25 DIAGNOSIS — Z955 Presence of coronary angioplasty implant and graft: Secondary | ICD-10-CM | POA: Diagnosis not present

## 2017-01-25 DIAGNOSIS — I214 Non-ST elevation (NSTEMI) myocardial infarction: Secondary | ICD-10-CM

## 2017-01-28 ENCOUNTER — Encounter (HOSPITAL_COMMUNITY): Payer: PPO

## 2017-01-30 ENCOUNTER — Encounter (HOSPITAL_COMMUNITY): Payer: PPO

## 2017-02-01 ENCOUNTER — Encounter (HOSPITAL_COMMUNITY): Payer: PPO

## 2017-02-04 ENCOUNTER — Encounter (INDEPENDENT_AMBULATORY_CARE_PROVIDER_SITE_OTHER): Payer: Self-pay

## 2017-02-04 ENCOUNTER — Ambulatory Visit (INDEPENDENT_AMBULATORY_CARE_PROVIDER_SITE_OTHER): Payer: PPO

## 2017-02-04 ENCOUNTER — Ambulatory Visit (INDEPENDENT_AMBULATORY_CARE_PROVIDER_SITE_OTHER): Payer: PPO | Admitting: Orthopaedic Surgery

## 2017-02-04 ENCOUNTER — Encounter (HOSPITAL_COMMUNITY): Payer: PPO

## 2017-02-04 DIAGNOSIS — M25562 Pain in left knee: Secondary | ICD-10-CM

## 2017-02-04 DIAGNOSIS — M25551 Pain in right hip: Secondary | ICD-10-CM | POA: Diagnosis not present

## 2017-02-04 DIAGNOSIS — M25561 Pain in right knee: Secondary | ICD-10-CM

## 2017-02-04 DIAGNOSIS — M7061 Trochanteric bursitis, right hip: Secondary | ICD-10-CM | POA: Diagnosis not present

## 2017-02-04 DIAGNOSIS — G8929 Other chronic pain: Secondary | ICD-10-CM

## 2017-02-04 DIAGNOSIS — M25552 Pain in left hip: Secondary | ICD-10-CM

## 2017-02-04 MED ORDER — LIDOCAINE HCL 1 % IJ SOLN
3.0000 mL | INTRAMUSCULAR | Status: AC | PRN
  Administered 2017-02-04: 3 mL

## 2017-02-04 MED ORDER — METHYLPREDNISOLONE ACETATE 40 MG/ML IJ SUSP
40.0000 mg | INTRAMUSCULAR | Status: AC | PRN
  Administered 2017-02-04: 40 mg via INTRA_ARTICULAR

## 2017-02-04 NOTE — Progress Notes (Signed)
Office Visit Note   Patient: Brad Woods           Date of Birth: [birth year]           MRN: [id number] Visit Date: 02/04/2017              Requested by: Joycelyn Rua, MD [address] [place], [place] [address] PCP: Joycelyn Rua, MD   Assessment & Plan: Visit Diagnoses:  1. Chronic pain of left knee   2. Chronic pain of right knee   3. Bilateral hip pain   4. Trochanteric bursitis, right hip     Plan: He tolerated the trochanteric injection well on his right side. I gave him reassurance that his knee replacements look good. I do feel he benefit from weight loss but also quad strengthening exercises and other modalities. I gave him a note for outpatient physical therapy. We'll see him back in follow-up to see if this is helped. I don't have any recommendations for the knee replacements themselves as they look good and I would not recommend any further surgery for his knees.  Follow-Up Instructions: Return in about 6 weeks (around 03/18/2017).   Orders:  Orders Placed This Encounter  Procedures  . Large Joint Injection/Arthrocentesis  . Large Joint Injection/Arthrocentesis  . XR Knee 1-2 Views Left  . XR Knee 1-2 Views Right  . XR HIPS BILAT W OR W/O PELVIS 3-4 VIEWS   No orders of the defined types were placed in this encounter.     Procedures: Large Joint Inj Date/Time: 02/04/2017 5:48 PM Performed by: Kathryne Hitch Authorized by: Kathryne Hitch   Location:  Hip Site:  R greater trochanter Ultrasound Guidance: No   Fluoroscopic Guidance: No   Arthrogram: No   Medications:  3 mL lidocaine 1 %; 40 mg methylPREDNISolone acetate 40 MG/ML     Clinical Data: No additional findings.   Subjective: No chief complaint on file. The patient is someone I've actually seen in the past for his hips. He comes in today with chief complaint of bilateral hip pain is been going on for years with the right worse than left. His hip continue to  bother him and he was told he said bursitis before. He points over his right trochanteric areas a source of his pain. He also has had bilateral knee pain since he's had knee replacements that were done by a physician and Kathryne Sharper about a year or 2 ago. He says he would like to be limited perform squats and get down on his knees but he has a problem doing that. He feels like "his kneecaps are going to burst". He says he gets painful to work.  HPI  Review of Systems He denies any systemic illnesses. He denies any chest pain, shortness of breath, fever, chills, nausea, vomiting.  Objective: Vital Signs: There were no vitals taken for this visit.  Physical Exam He is alert and oriented 3 and in no acute distress Ortho Exam Examine a show both his hips show he has fluid internal rotation rotation passively and actively of both hips with no blocks to rotation no pain in the groin on either side. He does have pain of the trochanteric area and evidence of bursitis on the right side. Both knees have well-healed surgical incisions. Neither knee has an effusion. Both knees have full range of motion with week quadricep muscles. Both knees feel ligamentously stable. Specialty Comments:  No specialty comments available.  Imaging: Xr  Hips Bilat W Or W/o Pelvis 3-4 Views  Result Date: 02/04/2017 An AP pelvis and lateral both hips show well maintained hip joint spaces. There is no significant arthritic changes in either hip. There is no evidence of cortical irregularities. Both hips are  Xr Knee 1-2 Views Left  Result Date: 02/04/2017 An AP and lateral of his left knee including a sunrise view show well-seated knee replacement. There is no effusion. There is no evidence of loosening or malalignment.  Xr Knee 1-2 Views Right  Result Date: 02/04/2017 An AP and lateral of the right knee shows a knee replacement that is in good position. There is no effusion. There is no evidence of loosening. The  patella on the sunrise view appears to be centrally located. No acute findings    PMFS History: Patient Active Problem List   Diagnosis Date Noted  . Mixed hyperlipidemia 12/31/2016  . Coronary artery disease involving native coronary artery of native heart without angina pectoris 11/20/2016  . Elevated troponin   . Unstable angina (HCC) 05/18/2016  . Acute coronary syndrome (HCC) 05/18/2016   Past Medical History:  Diagnosis Date  . Allergy   . Arthritis   . Asthma    when younger  . GERD (gastroesophageal reflux disease)   . Hyperlipidemia   . Hypertension   . Kidney stones   . Sleep apnea    cpap    Family History  Problem Relation Age of Onset  . Colon cancer Mother 56    Past Surgical History:  Procedure Laterality Date  . CARDIAC CATHETERIZATION N/A 05/20/2016   Procedure: Left Heart Cath and Coronary Angiography;  Surgeon: Runell Gess, MD;  Location: [facility] INVASIVE CV LAB;  Service: Cardiovascular;  Laterality: N/A;  . CARDIAC CATHETERIZATION N/A 11/22/2016   Procedure: Left Heart Cath and Coronary Angiography;  Surgeon: Yvonne Kendall, MD;  Location: [facility] INVASIVE CV LAB;  Service: Cardiovascular;  Laterality: N/A;  . COLONOSCOPY    . EYE SURGERY    . RECTAL SURGERY    . REPLACEMENT TOTAL KNEE Bilateral    Social History   Occupational History  . Not on file.   Social History Main Topics  . Smoking status: Never Smoker  . Smokeless tobacco: Never Used  . Alcohol use 0.0 oz/week     Comment: occasionally  . Drug use: No  . Sexual activity: Not on file

## 2017-02-06 ENCOUNTER — Encounter (HOSPITAL_COMMUNITY): Payer: PPO

## 2017-02-08 ENCOUNTER — Encounter (HOSPITAL_COMMUNITY): Payer: PPO

## 2017-02-08 ENCOUNTER — Telehealth (HOSPITAL_COMMUNITY): Payer: Self-pay | Admitting: Cardiac Rehabilitation

## 2017-02-08 DIAGNOSIS — M25562 Pain in left knee: Secondary | ICD-10-CM | POA: Diagnosis not present

## 2017-02-08 DIAGNOSIS — M7061 Trochanteric bursitis, right hip: Secondary | ICD-10-CM | POA: Diagnosis not present

## 2017-02-08 DIAGNOSIS — M25561 Pain in right knee: Secondary | ICD-10-CM | POA: Diagnosis not present

## 2017-02-08 NOTE — Telephone Encounter (Signed)
pc to pt to assess reason for continued absence from cardiac rehab. Pt states he has been attending physical therapy for hip pain.  Pt would like to return to cardiac rehab in 2-3 weeks once PT complete.  Pt will call with updates for his return.

## 2017-02-11 ENCOUNTER — Encounter (HOSPITAL_COMMUNITY): Admission: RE | Admit: 2017-02-11 | Payer: PPO | Source: Ambulatory Visit

## 2017-02-11 ENCOUNTER — Encounter (HOSPITAL_COMMUNITY): Payer: PPO

## 2017-02-12 DIAGNOSIS — M25561 Pain in right knee: Secondary | ICD-10-CM | POA: Diagnosis not present

## 2017-02-12 DIAGNOSIS — M7061 Trochanteric bursitis, right hip: Secondary | ICD-10-CM | POA: Diagnosis not present

## 2017-02-12 DIAGNOSIS — M25562 Pain in left knee: Secondary | ICD-10-CM | POA: Diagnosis not present

## 2017-02-13 ENCOUNTER — Encounter (HOSPITAL_COMMUNITY): Payer: PPO

## 2017-02-14 DIAGNOSIS — M25561 Pain in right knee: Secondary | ICD-10-CM | POA: Diagnosis not present

## 2017-02-14 DIAGNOSIS — M7061 Trochanteric bursitis, right hip: Secondary | ICD-10-CM | POA: Diagnosis not present

## 2017-02-14 DIAGNOSIS — M25562 Pain in left knee: Secondary | ICD-10-CM | POA: Diagnosis not present

## 2017-02-15 ENCOUNTER — Encounter (HOSPITAL_COMMUNITY): Payer: PPO

## 2017-02-18 ENCOUNTER — Encounter (HOSPITAL_COMMUNITY): Payer: PPO

## 2017-02-20 ENCOUNTER — Encounter (HOSPITAL_COMMUNITY): Admission: RE | Admit: 2017-02-20 | Payer: PPO | Source: Ambulatory Visit

## 2017-02-20 ENCOUNTER — Encounter (HOSPITAL_COMMUNITY): Payer: PPO

## 2017-02-21 DIAGNOSIS — M7061 Trochanteric bursitis, right hip: Secondary | ICD-10-CM | POA: Diagnosis not present

## 2017-02-21 DIAGNOSIS — M25562 Pain in left knee: Secondary | ICD-10-CM | POA: Diagnosis not present

## 2017-02-21 DIAGNOSIS — M25561 Pain in right knee: Secondary | ICD-10-CM | POA: Diagnosis not present

## 2017-02-22 ENCOUNTER — Encounter (HOSPITAL_COMMUNITY): Admission: RE | Admit: 2017-02-22 | Payer: PPO | Source: Ambulatory Visit

## 2017-02-22 ENCOUNTER — Encounter (HOSPITAL_COMMUNITY): Payer: PPO

## 2017-02-25 ENCOUNTER — Encounter (HOSPITAL_COMMUNITY): Admission: RE | Admit: 2017-02-25 | Payer: PPO | Source: Ambulatory Visit

## 2017-02-25 ENCOUNTER — Encounter (HOSPITAL_COMMUNITY): Payer: PPO

## 2017-02-26 DIAGNOSIS — M25561 Pain in right knee: Secondary | ICD-10-CM | POA: Diagnosis not present

## 2017-02-26 DIAGNOSIS — M25562 Pain in left knee: Secondary | ICD-10-CM | POA: Diagnosis not present

## 2017-02-26 DIAGNOSIS — M7061 Trochanteric bursitis, right hip: Secondary | ICD-10-CM | POA: Diagnosis not present

## 2017-02-27 ENCOUNTER — Telehealth (HOSPITAL_COMMUNITY): Payer: Self-pay | Admitting: Cardiac Rehabilitation

## 2017-02-27 ENCOUNTER — Encounter (HOSPITAL_COMMUNITY): Payer: PPO

## 2017-02-27 NOTE — Addendum Note (Signed)
Encounter addended by: Lowell Guitar, RN on: 02/27/2017 12:12 PM<BR>    Actions taken: Sign clinical note

## 2017-02-27 NOTE — Progress Notes (Signed)
Discharge Summary  Patient Details  Name: Brad Woods MRN: [id number] Date of Birth: [birth year] Referring Provider:   Flowsheet Row CARDIAC REHAB PHASE II ORIENTATION from 01/17/2017 in [facility] CARDIAC [facility]  Referring Provider  Nahser, Deloris Ping MD       Number of Visits: 3   Reason for Discharge:  Pt dropped form cardiac rehab to participate in outpatient cardiac rehab. Pt will return to cardiac rehab once physical therapy sessions complete.    Smoking History:  History  Smoking Status  . Never Smoker  Smokeless Tobacco  . Never Used    Diagnosis:  05/18/16 NSTEMI (non-ST elevated myocardial infarction) (HCC)  05/20/16 Status post coronary artery stent placement  ADL UCSD:   Initial Exercise Prescription:     Initial Exercise Prescription - 01/17/17 1100      Date of Initial Exercise RX and Referring Provider   Date 01/17/17   Referring Provider Nahser, Deloris Ping MD     Treadmill   MPH 1.7   Grade 0   Minutes 10   METs 2.3     Bike   Level 0.5   Minutes 10   METs 1.84     NuStep   Level 2   Minutes 10   METs 1.5     Arm Ergometer   Level 1   Minutes 10   METs 1.5     Prescription Details   Frequency (times per week) 3   Duration Progress to 30 minutes of continuous aerobic without signs/symptoms of physical distress     Intensity   THRR 40-80% of Max Heartrate 60-1221   Ratings of Perceived Exertion 11-13   Perceived Dyspnea 0-4     Progression   Progression Continue to progress workloads to maintain intensity without signs/symptoms of physical distress.     Resistance Training   Training Prescription Yes   Weight 2lbs   Reps 10-12      Discharge Exercise Prescription (Final Exercise Prescription Changes):   Functional Capacity:     6 Minute Walk    Row Name 01/17/17 0939 01/17/17 1144       6 Minute Walk   Phase Initial  -    Distance 1536 feet  -    Walk Time 6 minutes  -    # of Rest Breaks 0  -     MPH  - 2.9    METS  - 2.7    RPE 12  -    VO2 Peak  - 9.47    Symptoms Yes (comment)  -    Comments R hip pain 2-3/10 B knee pain 2/10  -    Resting HR 89 bpm  -    Resting BP 136/68  -    Max Ex. HR  - 100 bpm    Max Ex. BP 146/72  -    2 Minute Post BP  - 118/66       Psychological, QOL, Others - Outcomes: PHQ 2/9: Depression screen PHQ 2/9 01/23/2017  Decreased Interest 0  Down, Depressed, Hopeless 0  PHQ - 2 Score 0    Quality of Life:     Quality of Life - 01/17/17 1155      Quality of Life Scores   Health/Function Pre 18.73 %   Socioeconomic Pre 23.56 %   Psych/Spiritual Pre 22.29 %   Family Pre 24 %   GLOBAL Pre 21.3 %      Personal Goals: Goals established  at orientation with interventions provided to work toward goal.     Personal Goals and Risk Factors at Admission - 01/17/17 1425      Core Components/Risk Factors/Patient Goals on Admission   Improve shortness of breath with ADL's Yes   Intervention Provide education, individualized exercise plan and daily activity instruction to help decrease symptoms of SOB with activities of daily living.   Expected Outcomes Short Term: Achieves a reduction of symptoms when performing activities of daily living.   Develop more efficient breathing techniques such as purse lipped breathing and diaphragmatic breathing; and practicing self-pacing with activity Yes   Intervention Provide education, demonstration and support about specific breathing techniuqes utilized for more efficient breathing. Include techniques such as pursed lipped breathing, diaphragmatic breathing and self-pacing activity.   Expected Outcomes Short Term: Participant will be able to demonstrate and use breathing techniques as needed throughout daily activities.       Personal Goals Discharge:   Nutrition & Weight - Outcomes:     Pre Biometrics - 01/17/17 1148      Pre Biometrics   % Body Fat 40.5 %       Nutrition:     Nutrition  Therapy & Goals - 01/23/17 0903      Nutrition Therapy   Diet --  pt given diet record worksheet to complete      Nutrition Discharge:     Nutrition Assessments - 01/21/17 1059      MEDFICTS Scores   Pre Score 50      Education Questionnaire Score:     Knowledge Questionnaire Score - 01/17/17 1142      Knowledge Questionnaire Score   Pre Score 24/24      Goals reviewed with patient; copy given to patient.

## 2017-02-27 NOTE — Telephone Encounter (Signed)
pc to pt to discuss ability to return to cardiac rehab. Pt is still actively participating in physical therapy.  Pt will be dropped from cardiac rehab program and will restart once physical therapy completed.

## 2017-03-01 ENCOUNTER — Encounter (HOSPITAL_COMMUNITY): Admission: RE | Admit: 2017-03-01 | Payer: PPO | Source: Ambulatory Visit

## 2017-03-01 ENCOUNTER — Encounter (HOSPITAL_COMMUNITY): Payer: PPO

## 2017-03-01 NOTE — Addendum Note (Signed)
Encounter addended by: Rhonda Vangieson D Jeri Rawlins on: 03/01/2017  4:56 PM<BR>    Actions taken: Flowsheet accepted, Flowsheet data copied forward, Visit Navigator Flowsheet section accepted

## 2017-03-04 ENCOUNTER — Encounter (HOSPITAL_COMMUNITY): Payer: PPO

## 2017-03-04 ENCOUNTER — Encounter (HOSPITAL_COMMUNITY): Admission: RE | Admit: 2017-03-04 | Payer: PPO | Source: Ambulatory Visit

## 2017-03-06 ENCOUNTER — Encounter (HOSPITAL_COMMUNITY): Payer: PPO

## 2017-03-06 DIAGNOSIS — J309 Allergic rhinitis, unspecified: Secondary | ICD-10-CM | POA: Diagnosis not present

## 2017-03-06 DIAGNOSIS — N400 Enlarged prostate without lower urinary tract symptoms: Secondary | ICD-10-CM | POA: Diagnosis not present

## 2017-03-06 DIAGNOSIS — I10 Essential (primary) hypertension: Secondary | ICD-10-CM | POA: Diagnosis not present

## 2017-03-06 DIAGNOSIS — E7800 Pure hypercholesterolemia, unspecified: Secondary | ICD-10-CM | POA: Diagnosis not present

## 2017-03-06 DIAGNOSIS — G4733 Obstructive sleep apnea (adult) (pediatric): Secondary | ICD-10-CM | POA: Diagnosis not present

## 2017-03-06 DIAGNOSIS — I2510 Atherosclerotic heart disease of native coronary artery without angina pectoris: Secondary | ICD-10-CM | POA: Diagnosis not present

## 2017-03-06 DIAGNOSIS — M1990 Unspecified osteoarthritis, unspecified site: Secondary | ICD-10-CM | POA: Diagnosis not present

## 2017-03-06 DIAGNOSIS — K219 Gastro-esophageal reflux disease without esophagitis: Secondary | ICD-10-CM | POA: Diagnosis not present

## 2017-03-06 DIAGNOSIS — J45909 Unspecified asthma, uncomplicated: Secondary | ICD-10-CM | POA: Diagnosis not present

## 2017-03-06 DIAGNOSIS — Z0000 Encounter for general adult medical examination without abnormal findings: Secondary | ICD-10-CM | POA: Diagnosis not present

## 2017-03-08 ENCOUNTER — Encounter (HOSPITAL_COMMUNITY): Admission: RE | Admit: 2017-03-08 | Payer: PPO | Source: Ambulatory Visit

## 2017-03-08 ENCOUNTER — Encounter (HOSPITAL_COMMUNITY): Payer: PPO

## 2017-03-11 ENCOUNTER — Encounter (HOSPITAL_COMMUNITY): Payer: PPO

## 2017-03-13 ENCOUNTER — Encounter (HOSPITAL_COMMUNITY): Admission: RE | Admit: 2017-03-13 | Payer: PPO | Source: Ambulatory Visit

## 2017-03-13 ENCOUNTER — Encounter (HOSPITAL_COMMUNITY): Payer: PPO

## 2017-03-15 ENCOUNTER — Encounter (HOSPITAL_COMMUNITY): Payer: PPO

## 2017-03-18 ENCOUNTER — Ambulatory Visit (INDEPENDENT_AMBULATORY_CARE_PROVIDER_SITE_OTHER): Payer: PPO | Admitting: Orthopaedic Surgery

## 2017-03-18 DIAGNOSIS — M7061 Trochanteric bursitis, right hip: Secondary | ICD-10-CM | POA: Diagnosis not present

## 2017-03-18 NOTE — Progress Notes (Signed)
The patient is following up after having a trochanteric bursa injection of his right hip about a month to 5 weeks ago. He said between that injection physical therapy he is certainly doing better. He has a remote history of bilateral knee replacements. He is obese. He's been to some right-sided low back pain as well. He is transitioning from outpatient physical therapy to now home exercise program. He is doing well overall.  He tolerated to put his right hip the range of motion. His pain is over the trochanteric area and the low back and it does seem to be improved.  At this point we'll reinject him in about 6 weeks or so from now if he is having issues. Continue his home exercise program.

## 2017-03-19 ENCOUNTER — Ambulatory Visit (HOSPITAL_COMMUNITY): Payer: PPO

## 2017-05-02 ENCOUNTER — Ambulatory Visit (INDEPENDENT_AMBULATORY_CARE_PROVIDER_SITE_OTHER): Payer: PPO | Admitting: Orthopaedic Surgery

## 2017-05-02 DIAGNOSIS — M7061 Trochanteric bursitis, right hip: Secondary | ICD-10-CM | POA: Diagnosis not present

## 2017-05-02 DIAGNOSIS — G5602 Carpal tunnel syndrome, left upper limb: Secondary | ICD-10-CM | POA: Diagnosis not present

## 2017-05-02 DIAGNOSIS — M7062 Trochanteric bursitis, left hip: Secondary | ICD-10-CM | POA: Insufficient documentation

## 2017-05-02 DIAGNOSIS — M25511 Pain in right shoulder: Secondary | ICD-10-CM | POA: Diagnosis not present

## 2017-05-02 MED ORDER — METHYLPREDNISOLONE ACETATE 40 MG/ML IJ SUSP
40.0000 mg | INTRAMUSCULAR | Status: AC | PRN
  Administered 2017-05-02: 40 mg via INTRA_ARTICULAR

## 2017-05-02 MED ORDER — LIDOCAINE HCL 1 % IJ SOLN
3.0000 mL | INTRAMUSCULAR | Status: AC | PRN
  Administered 2017-05-02: 3 mL

## 2017-05-02 NOTE — Progress Notes (Signed)
Office Visit Note   Patient: Brad Woods           Date of Birth: November 10, 1946           MRN: [id number] Visit Date: 05/02/2017              Requested by: Joycelyn Rua, MD [address] [place], [place] [address] PCP: Joycelyn Rua, MD   Assessment & Plan: Visit Diagnoses:  1. Acute pain of right shoulder   2. Trochanteric bursitis, right hip   3. Trochanteric bursitis, left hip   4. Carpal tunnel syndrome, left upper limb     Plan: I was able to talk about injections to explain the risk and benefits injections. Is not a diabetic so injected his right shoulder and both hip trochanteric regions. We talked about carpal tunnel surgery in the near future if he decides to have this done he will let us know we can set this up as an outpatient I had a thorough discussion of risk and benefits of the surgery. Otherwise I'll need see him back for at least 4 months. He needed.  Follow-Up Instructions: Return in about 4 months (around 09/02/2017).   Orders:  Orders Placed This Encounter  Procedures  . Large Joint Injection/Arthrocentesis  . Large Joint Injection/Arthrocentesis  . Large Joint Injection/Arthrocentesis   No orders of the defined types were placed in this encounter.     Procedures: Large Joint Inj Date/Time: 05/02/2017 1:18 PM Performed by: Kathryne Hitch Authorized by: Doneen Poisson Y   Location:  Shoulder Site:  R subacromial bursa Ultrasound Guidance: No   Fluoroscopic Guidance: No   Arthrogram: No   Medications:  3 mL lidocaine 1 %; 40 mg methylPREDNISolone acetate 40 MG/ML Large Joint Inj Date/Time: 05/02/2017 1:19 PM Performed by: Kathryne Hitch Authorized by: Kathryne Hitch   Location:  Hip Site:  R greater trochanter Ultrasound Guidance: No   Fluoroscopic Guidance: No   Arthrogram: No   Medications:  3 mL lidocaine 1 %; 40 mg methylPREDNISolone acetate 40 MG/ML Large Joint Inj Date/Time: 05/02/2017 1:19  PM Performed by: Kathryne Hitch Authorized by: Kathryne Hitch   Location:  Hip Site:  L greater trochanter Ultrasound Guidance: No   Fluoroscopic Guidance: No   Arthrogram: No   Medications:  3 mL lidocaine 1 %; 40 mg methylPREDNISolone acetate 40 MG/ML     Clinical Data: No additional findings.   Subjective: No chief complaint on file. Patient has multiple complaints today. We've seen him for a lateral trochanteric bursitis he like injections in both his hips today. He's been going to physical therapy. He also complaints of left carpal tunnel syndrome and right shoulder pain. He said a right carpal tunnel release in the past was told he on the left side. This was years ago. He gets numbness and tingling in his left hand. It wakes him up at night. He's had shoulder complaints in terms of pain wake him up at night without any specific injury. In both hips a been hurting of the trochanteric area. He is somewhat is significantly obese. He also has low back pain.  HPI  Review of Systems He currently denies any headache, shortness of breath, fever, chills, nausea, vomiting, chest pain  Objective: Vital Signs: There were no vitals taken for this visit.  Physical Exam He is alert and oriented 3 in no acute distress Ortho Exam Examination of his right shoulder shows full range of motion but  positive signs of impingement with Neer and Hawkins signs and pain. His rotator cuff feels strong. Both hips have pain only over the trochanteric area with good range of motion otherwise. His hand has numbness in the median nerve distribution a left side with weak grip strength. He has low back pain in the sciatic and facet joint regions of his low back and pelvis. He is significant only obese. Specialty Comments:  No specialty comments available.  Imaging: No results found.   PMFS History: Patient Active Problem List   Diagnosis Date Noted  . Trochanteric bursitis, left hip  05/02/2017  . Carpal tunnel syndrome, left upper limb 05/02/2017  . Trochanteric bursitis, right hip 03/18/2017  . Mixed hyperlipidemia 12/31/2016  . Coronary artery disease involving native coronary artery of native heart without angina pectoris 11/20/2016  . Elevated troponin   . Unstable angina (HCC) 05/18/2016  . Acute coronary syndrome (HCC) 05/18/2016   Past Medical History:  Diagnosis Date  . Allergy   . Arthritis   . Asthma    when younger  . GERD (gastroesophageal reflux disease)   . Hyperlipidemia   . Hypertension   . Kidney stones   . Sleep apnea    cpap    Family History  Problem Relation Age of Onset  . Colon cancer Mother 45    Past Surgical History:  Procedure Laterality Date  . CARDIAC CATHETERIZATION N/A 05/20/2016   Procedure: Left Heart Cath and Coronary Angiography;  Surgeon: Runell Gess, MD;  Location: [facility] INVASIVE CV LAB;  Service: Cardiovascular;  Laterality: N/A;  . CARDIAC CATHETERIZATION N/A 11/22/2016   Procedure: Left Heart Cath and Coronary Angiography;  Surgeon: Yvonne Kendall, MD;  Location: [facility] INVASIVE CV LAB;  Service: Cardiovascular;  Laterality: N/A;  . COLONOSCOPY    . EYE SURGERY    . RECTAL SURGERY    . REPLACEMENT TOTAL KNEE Bilateral    Social History   Occupational History  . Not on file.   Social History Main Topics  . Smoking status: Never Smoker  . Smokeless tobacco: Never Used  . Alcohol use 0.0 oz/week     Comment: occasionally  . Drug use: No  . Sexual activity: Not on file

## 2017-05-13 ENCOUNTER — Encounter: Payer: Self-pay | Admitting: Cardiovascular Disease

## 2017-05-13 ENCOUNTER — Ambulatory Visit (INDEPENDENT_AMBULATORY_CARE_PROVIDER_SITE_OTHER): Payer: PPO | Admitting: Cardiovascular Disease

## 2017-05-13 ENCOUNTER — Encounter (INDEPENDENT_AMBULATORY_CARE_PROVIDER_SITE_OTHER): Payer: Self-pay

## 2017-05-13 VITALS — BP 100/68 | HR 86 | Ht 73.0 in | Wt 296.1 lb

## 2017-05-13 DIAGNOSIS — E782 Mixed hyperlipidemia: Secondary | ICD-10-CM | POA: Diagnosis not present

## 2017-05-13 DIAGNOSIS — I10 Essential (primary) hypertension: Secondary | ICD-10-CM

## 2017-05-13 DIAGNOSIS — I2510 Atherosclerotic heart disease of native coronary artery without angina pectoris: Secondary | ICD-10-CM

## 2017-05-13 LAB — COMPREHENSIVE METABOLIC PANEL
A/G RATIO: 1.8 (ref 1.2–2.2)
ALT: 29 IU/L (ref 0–44)
AST: 21 IU/L (ref 0–40)
Albumin: 4.4 g/dL (ref 3.5–4.8)
Alkaline Phosphatase: 91 IU/L (ref 39–117)
BUN/Creatinine Ratio: 16 (ref 10–24)
BUN: 15 mg/dL (ref 8–27)
Bilirubin Total: 1 mg/dL (ref 0.0–1.2)
CO2: 23 mmol/L (ref 18–29)
Calcium: 9.3 mg/dL (ref 8.6–10.2)
Chloride: 100 mmol/L (ref 96–106)
Creatinine, Ser: 0.95 mg/dL (ref 0.76–1.27)
GFR calc non Af Amer: 81 mL/min/{1.73_m2} (ref >59)
GFR, EST AFRICAN AMERICAN: 93 mL/min/{1.73_m2} (ref >59)
Globulin, Total: 2.5 g/dL (ref 1.5–4.5)
Glucose: 97 mg/dL (ref 65–99)
POTASSIUM: 4.6 mmol/L (ref 3.5–5.2)
SODIUM: 136 mmol/L (ref 134–144)
Total Protein: 6.9 g/dL (ref 6.0–8.5)

## 2017-05-13 LAB — LIPID PANEL
CHOL/HDL RATIO: 3.5 ratio (ref 0.0–5.0)
Cholesterol, Total: 120 mg/dL (ref 100–199)
HDL: 34 mg/dL — AB (ref >39)
LDL CALC: 58 mg/dL (ref 0–99)
TRIGLYCERIDES: 142 mg/dL (ref 0–149)
VLDL Cholesterol Cal: 28 mg/dL (ref 5–40)

## 2017-05-13 MED ORDER — NITROGLYCERIN 0.4 MG SL SUBL: 0.4000 mg | SUBLINGUAL_TABLET | SUBLINGUAL | 6 refills | Status: DC | PRN

## 2017-05-13 NOTE — Progress Notes (Signed)
Cardiology Office Note   Date:  05/13/2017   ID:  Brad Woods, DOB 06-[name]-1946, MRN [id number]  PCP:  Brad Rua, MD  Cardiologist:   Brad Miss, MD   Chief Complaint  Patient presents with  . Coronary Artery Disease      History of Present Illness: Brad Woods is a 71 y.o. male who presents for follow up of his CAD.   Seen with wife , Brad Woods  Has been having CP since May. Went to the [facility] , was sent to the hospital May 18, 2016 Was admitted,    At 2 AM , his CP worsened acutely .   Had a stent to his LCx.   ( 90% - 0%)   He wanted to change cardiologist and presents for further management .   Has been having CP similar to his presenting symptom in May. Seem to be worse with exertion, better with rest.   Had another episode of CP last night. Mid sternal CP, radiation down his left arm .  Took a SL NTG  - CP was relieved with NTG .   Has had some shortness of breath - possibly shortly after taking the Brilinta   Also has some belching relieves the CP    does not get any regular exercise. Before the MI, was going several times a week   [name]. 8, 2018:  Brad Woods had his heart catheterization since her last visit. He was found have moderate disease. His stents are patent. He did not require any additional stenting. He was changed from Brilinta to Plavix because of shortness of breath. He seems to be feeling a lot better after that medicine change. Wants to get back into cardiac rehab   May 13, 2017:  Brad Woods is seen for follow up of his CAD. We changed the Brilinta to plavix which helped his dyspnea Rare episodes of CP Has indigestion frequently ,  Belches seem to relieve his CP on occasion .  Exercises regularly  Wt Readings from Last 3 Encounters:  05/13/17 296 lb 1.9 oz (134.3 kg)  03/01/17 (!) 300 lb 7.8 oz (136.3 kg)  01/17/17 (!) 303 lb 12.7 oz (137.8 kg)   Wt is down 7 lbs since January .    Past Medical History:  Diagnosis Date  . Allergy   .  Arthritis   . Asthma    when younger  . GERD (gastroesophageal reflux disease)   . Hyperlipidemia   . Hypertension   . Kidney stones   . Sleep apnea    cpap    Past Surgical History:  Procedure Laterality Date  . CARDIAC CATHETERIZATION N/A 05/20/2016   Procedure: Left Heart Cath and Coronary Angiography;  Surgeon: Runell Gess, MD;  Location: [facility] INVASIVE CV LAB;  Service: Cardiovascular;  Laterality: N/A;  . CARDIAC CATHETERIZATION N/A 11/22/2016   Procedure: Left Heart Cath and Coronary Angiography;  Surgeon: Yvonne Kendall, MD;  Location: [facility] INVASIVE CV LAB;  Service: Cardiovascular;  Laterality: N/A;  . COLONOSCOPY    . EYE SURGERY    . RECTAL SURGERY    . REPLACEMENT TOTAL KNEE Bilateral      Current Outpatient Prescriptions  Medication Sig Dispense Refill  . acetaminophen (TYLENOL) 500 MG tablet Take 500 mg by mouth every 6 (six) hours as needed.    Marland Kitchen albuterol (PROVENTIL HFA;VENTOLIN HFA) 108 (90 Base) MCG/ACT inhaler Inhale 2 puffs into the lungs every 6 (six) hours as needed for wheezing or shortness of breath.  1 Inhaler 2  . aspirin EC 81 MG EC tablet Take 1 tablet (81 mg total) by mouth daily.    Marland Kitchen atorvastatin (LIPITOR) 80 MG tablet Take 1 tablet (80 mg total) by mouth daily at 6 PM. 30 tablet 3  . carvedilol (COREG) 3.125 MG tablet Take 1 tablet (3.125 mg total) by mouth 2 (two) times daily with a meal. 60 tablet 3  . clopidogrel (PLAVIX) 75 MG tablet Take 1 tablet (75 mg total) by mouth daily. Take 4 tablets (300 mg) the first day, then 1 tablet (75 mg) daily thereafter. 90 tablet 3  . finasteride (PROSCAR) 5 MG tablet Take 5 mg by mouth daily.    . fluticasone (FLONASE) 50 MCG/ACT nasal spray Place 2 sprays into the nose daily as needed for allergies.     . isosorbide mononitrate (IMDUR) 60 MG 24 hr tablet Take 1 tablet (60 mg total) by mouth daily. 30 tablet 11  . Multiple Vitamins-Minerals (CENTRUM SILVER 50+MEN PO) Take 1 tablet by mouth daily.    .  nitroGLYCERIN (NITROSTAT) 0.4 MG SL tablet Place 0.4 mg under the tongue every 5 (five) minutes as needed for chest pain.    . tamsulosin (FLOMAX) 0.4 MG CAPS capsule Take 0.4 mg by mouth daily.      No current facility-administered medications for this visit.     Allergies:   Hydromorphone hcl and Aleve [naproxen sodium]    Social History:  The patient  reports that he has never smoked. He has never used smokeless tobacco. He reports that he drinks alcohol. He reports that he does not use drugs.   Family History:  The patient's family history includes Colon cancer (age of onset: 2) in his mother.    ROS:  Please see the history of present illness.    Review of Systems: Constitutional:  denies fever, chills, diaphoresis, appetite change and fatigue.  HEENT: denies photophobia, eye pain, redness, hearing loss, ear pain, congestion, sore throat, rhinorrhea, sneezing, neck pain, neck stiffness and tinnitus.  Respiratory: denies SOB, DOE, cough, chest tightness, and wheezing.  Cardiovascular: admits to chest pain this afternoon and with exertion   Gastrointestinal: denies nausea, vomiting, abdominal pain, diarrhea, constipation, blood in stool.  Genitourinary: denies dysuria, urgency, frequency, hematuria, flank pain and difficulty urinating.  Musculoskeletal: denies  myalgias, back pain, joint swelling, arthralgias and gait problem.   Skin: denies pallor, rash and wound.  Neurological: denies dizziness, seizures, syncope, weakness, light-headedness, numbness and headaches.   Hematological: denies adenopathy, easy bruising, personal or family bleeding history.  Psychiatric/ Behavioral: denies suicidal ideation, mood changes, confusion, nervousness, sleep disturbance and agitation.       All other systems are reviewed and negative.    PHYSICAL EXAM: VS:  BP 100/68 (BP Location: Left Arm, Patient Position: Sitting, Cuff Size: Large)   Pulse 86   Ht 6\' 1"  (1.854 m)   Wt 296 lb 1.9  oz (134.3 kg)   SpO2 98%   BMI 39.07 kg/m  , BMI Body mass index is 39.07 kg/m. GEN: Well nourished, well developed, in no acute distress  HEENT: normal  Neck: no JVD, carotid bruits, or masses Cardiac: RRR; no murmurs, rubs, or gallops,no edema  Respiratory:  clear to auscultation bilaterally, normal work of breathing GI: soft, nontender, nondistended, + BS MS: no deformity or atrophy  Skin: warm and dry, no rash Neuro:  Strength and sensation are intact Psych: normal   EKG:  EKG is ordered today. The ekg ordered today  demonstrates Nov. 28, 2017:  NSR , RBBB, Inf. MI   Recent Labs: 05/18/2016: Magnesium 2.3 11/20/2016: ALT 20; BUN 15; Creat 0.96; Hemoglobin 15.7; Platelets 181; Potassium 4.1; Sodium 137    Lipid Panel    Component Value Date/Time   CHOL 136 11/20/2016 1643   TRIG 156 (H) 11/20/2016 1643   HDL 34 (L) 11/20/2016 1643   CHOLHDL 4.0 11/20/2016 1643   VLDL 31 (H) 11/20/2016 1643   LDLCALC 71 11/20/2016 1643      Wt Readings from Last 3 Encounters:  05/13/17 296 lb 1.9 oz (134.3 kg)  03/01/17 (!) 300 lb 7.8 oz (136.3 kg)  01/17/17 (!) 303 lb 12.7 oz (137.8 kg)      Other studies Reviewed: Additional studies/ records that were reviewed today include: . Review of the above records demonstrates:    ASSESSMENT AND PLAN:  1.  angina: Eden Emms to be stable at this point.  Has some episodes of CP but they seem to be related to GI issues.  Continue current meds. . Has diffuse disease of his small PDA.    Stent was widely patent by cath in Nov. 2017.    2. Hyperlipidemia: Continue current medications. His triglyceride level is mildly elevated. Continue current medications. Check labs today and in 6 months .  3. Morbid obesity :  Has lost 7 lbs since [name].  Encouraged him to continue with his diet and weight loss efforts    Current medicines are reviewed at length with the patient today.  The patient does not have concerns regarding  medicines.  Labs/ tests ordered today include:  No orders of the defined types were placed in this encounter.    Disposition:   FU with me in 6  months      Brad Miss, MD  05/13/2017 12:04 PM    [facility] [address] [place], [place], [place]  [address] Phone: [phone]; Fax: [phone]

## 2017-05-13 NOTE — Patient Instructions (Signed)
Medication Instructions:  Your physician recommends that you continue on your current medications as directed. Please refer to the Current Medication list given to you today.   Labwork: TODAY - cholesterol, complete metabolic panel  Your physician recommends that you return for lab work in: 6 months on the day of or a few days before your office visit with Dr. Nahser.  You will need to FAST for this appointment - nothing to eat or drink after midnight the night before except water.   Testing/Procedures: None Ordered   Follow-Up: Your physician wants you to follow-up in: 6 months with Dr. Nahser.  You will receive a reminder letter in the mail two months in advance. If you don't receive a letter, please call our office to schedule the follow-up appointment.   If you need a refill on your cardiac medications before your next appointment, please call your pharmacy.   Thank you for choosing [facility]! Darly Massi, RN [phone]    

## 2017-08-07 DIAGNOSIS — M25562 Pain in left knee: Secondary | ICD-10-CM | POA: Diagnosis not present

## 2017-08-07 DIAGNOSIS — M25552 Pain in left hip: Secondary | ICD-10-CM | POA: Diagnosis not present

## 2017-08-07 DIAGNOSIS — M25561 Pain in right knee: Secondary | ICD-10-CM | POA: Diagnosis not present

## 2017-08-07 DIAGNOSIS — M545 Low back pain: Secondary | ICD-10-CM | POA: Diagnosis not present

## 2017-08-07 DIAGNOSIS — M25551 Pain in right hip: Secondary | ICD-10-CM | POA: Diagnosis not present

## 2017-08-08 ENCOUNTER — Other Ambulatory Visit: Payer: Self-pay | Admitting: Cardiovascular Disease

## 2017-08-09 DIAGNOSIS — M25561 Pain in right knee: Secondary | ICD-10-CM | POA: Diagnosis not present

## 2017-08-09 DIAGNOSIS — M545 Low back pain: Secondary | ICD-10-CM | POA: Diagnosis not present

## 2017-08-09 DIAGNOSIS — M25551 Pain in right hip: Secondary | ICD-10-CM | POA: Diagnosis not present

## 2017-08-09 DIAGNOSIS — M25552 Pain in left hip: Secondary | ICD-10-CM | POA: Diagnosis not present

## 2017-08-09 DIAGNOSIS — M25562 Pain in left knee: Secondary | ICD-10-CM | POA: Diagnosis not present

## 2017-08-13 DIAGNOSIS — M25562 Pain in left knee: Secondary | ICD-10-CM | POA: Diagnosis not present

## 2017-08-13 DIAGNOSIS — M25551 Pain in right hip: Secondary | ICD-10-CM | POA: Diagnosis not present

## 2017-08-13 DIAGNOSIS — M25552 Pain in left hip: Secondary | ICD-10-CM | POA: Diagnosis not present

## 2017-08-13 DIAGNOSIS — M545 Low back pain: Secondary | ICD-10-CM | POA: Diagnosis not present

## 2017-08-13 DIAGNOSIS — M25561 Pain in right knee: Secondary | ICD-10-CM | POA: Diagnosis not present

## 2017-08-20 DIAGNOSIS — M25551 Pain in right hip: Secondary | ICD-10-CM | POA: Diagnosis not present

## 2017-08-20 DIAGNOSIS — M25561 Pain in right knee: Secondary | ICD-10-CM | POA: Diagnosis not present

## 2017-08-20 DIAGNOSIS — M545 Low back pain: Secondary | ICD-10-CM | POA: Diagnosis not present

## 2017-08-20 DIAGNOSIS — M25552 Pain in left hip: Secondary | ICD-10-CM | POA: Diagnosis not present

## 2017-08-20 DIAGNOSIS — M25562 Pain in left knee: Secondary | ICD-10-CM | POA: Diagnosis not present

## 2017-08-21 DIAGNOSIS — M25552 Pain in left hip: Secondary | ICD-10-CM | POA: Diagnosis not present

## 2017-08-21 DIAGNOSIS — M25551 Pain in right hip: Secondary | ICD-10-CM | POA: Diagnosis not present

## 2017-08-21 DIAGNOSIS — M25562 Pain in left knee: Secondary | ICD-10-CM | POA: Diagnosis not present

## 2017-08-21 DIAGNOSIS — M25561 Pain in right knee: Secondary | ICD-10-CM | POA: Diagnosis not present

## 2017-08-21 DIAGNOSIS — M545 Low back pain: Secondary | ICD-10-CM | POA: Diagnosis not present

## 2017-09-02 ENCOUNTER — Ambulatory Visit (INDEPENDENT_AMBULATORY_CARE_PROVIDER_SITE_OTHER): Payer: PPO | Admitting: Orthopaedic Surgery

## 2017-09-05 DIAGNOSIS — I2510 Atherosclerotic heart disease of native coronary artery without angina pectoris: Secondary | ICD-10-CM | POA: Diagnosis not present

## 2017-09-05 DIAGNOSIS — J45909 Unspecified asthma, uncomplicated: Secondary | ICD-10-CM | POA: Diagnosis not present

## 2017-09-05 DIAGNOSIS — Z6841 Body Mass Index (BMI) 40.0 and over, adult: Secondary | ICD-10-CM | POA: Diagnosis not present

## 2017-09-05 DIAGNOSIS — E7800 Pure hypercholesterolemia, unspecified: Secondary | ICD-10-CM | POA: Diagnosis not present

## 2017-09-05 DIAGNOSIS — Z1159 Encounter for screening for other viral diseases: Secondary | ICD-10-CM | POA: Diagnosis not present

## 2017-09-05 DIAGNOSIS — Z23 Encounter for immunization: Secondary | ICD-10-CM | POA: Diagnosis not present

## 2017-09-05 DIAGNOSIS — N400 Enlarged prostate without lower urinary tract symptoms: Secondary | ICD-10-CM | POA: Diagnosis not present

## 2017-09-05 DIAGNOSIS — K219 Gastro-esophageal reflux disease without esophagitis: Secondary | ICD-10-CM | POA: Diagnosis not present

## 2017-09-05 DIAGNOSIS — I10 Essential (primary) hypertension: Secondary | ICD-10-CM | POA: Diagnosis not present

## 2017-09-05 DIAGNOSIS — M1990 Unspecified osteoarthritis, unspecified site: Secondary | ICD-10-CM | POA: Diagnosis not present

## 2017-09-12 DIAGNOSIS — M545 Low back pain: Secondary | ICD-10-CM | POA: Diagnosis not present

## 2017-09-12 DIAGNOSIS — M25552 Pain in left hip: Secondary | ICD-10-CM | POA: Diagnosis not present

## 2017-09-12 DIAGNOSIS — M25561 Pain in right knee: Secondary | ICD-10-CM | POA: Diagnosis not present

## 2017-09-12 DIAGNOSIS — M25551 Pain in right hip: Secondary | ICD-10-CM | POA: Diagnosis not present

## 2017-09-12 DIAGNOSIS — M25562 Pain in left knee: Secondary | ICD-10-CM | POA: Diagnosis not present

## 2017-09-19 DIAGNOSIS — M25562 Pain in left knee: Secondary | ICD-10-CM | POA: Diagnosis not present

## 2017-09-19 DIAGNOSIS — M25551 Pain in right hip: Secondary | ICD-10-CM | POA: Diagnosis not present

## 2017-09-19 DIAGNOSIS — M545 Low back pain: Secondary | ICD-10-CM | POA: Diagnosis not present

## 2017-09-19 DIAGNOSIS — M25561 Pain in right knee: Secondary | ICD-10-CM | POA: Diagnosis not present

## 2017-09-19 DIAGNOSIS — M25552 Pain in left hip: Secondary | ICD-10-CM | POA: Diagnosis not present

## 2017-09-26 DIAGNOSIS — M25552 Pain in left hip: Secondary | ICD-10-CM | POA: Diagnosis not present

## 2017-09-26 DIAGNOSIS — M25562 Pain in left knee: Secondary | ICD-10-CM | POA: Diagnosis not present

## 2017-09-26 DIAGNOSIS — M25551 Pain in right hip: Secondary | ICD-10-CM | POA: Diagnosis not present

## 2017-09-26 DIAGNOSIS — M25561 Pain in right knee: Secondary | ICD-10-CM | POA: Diagnosis not present

## 2017-09-26 DIAGNOSIS — M545 Low back pain: Secondary | ICD-10-CM | POA: Diagnosis not present

## 2017-10-03 DIAGNOSIS — M25551 Pain in right hip: Secondary | ICD-10-CM | POA: Diagnosis not present

## 2017-10-03 DIAGNOSIS — M25552 Pain in left hip: Secondary | ICD-10-CM | POA: Diagnosis not present

## 2017-10-03 DIAGNOSIS — M545 Low back pain: Secondary | ICD-10-CM | POA: Diagnosis not present

## 2017-10-03 DIAGNOSIS — M25561 Pain in right knee: Secondary | ICD-10-CM | POA: Diagnosis not present

## 2017-10-03 DIAGNOSIS — M25562 Pain in left knee: Secondary | ICD-10-CM | POA: Diagnosis not present

## 2017-10-10 DIAGNOSIS — M25552 Pain in left hip: Secondary | ICD-10-CM | POA: Diagnosis not present

## 2017-10-10 DIAGNOSIS — M25551 Pain in right hip: Secondary | ICD-10-CM | POA: Diagnosis not present

## 2017-10-10 DIAGNOSIS — M25562 Pain in left knee: Secondary | ICD-10-CM | POA: Diagnosis not present

## 2017-10-10 DIAGNOSIS — M25561 Pain in right knee: Secondary | ICD-10-CM | POA: Diagnosis not present

## 2017-10-10 DIAGNOSIS — M545 Low back pain: Secondary | ICD-10-CM | POA: Diagnosis not present

## 2017-10-15 DIAGNOSIS — M25561 Pain in right knee: Secondary | ICD-10-CM | POA: Diagnosis not present

## 2017-10-15 DIAGNOSIS — M25552 Pain in left hip: Secondary | ICD-10-CM | POA: Diagnosis not present

## 2017-10-15 DIAGNOSIS — M25551 Pain in right hip: Secondary | ICD-10-CM | POA: Diagnosis not present

## 2017-10-15 DIAGNOSIS — M545 Low back pain: Secondary | ICD-10-CM | POA: Diagnosis not present

## 2017-10-15 DIAGNOSIS — M25562 Pain in left knee: Secondary | ICD-10-CM | POA: Diagnosis not present

## 2017-10-17 DIAGNOSIS — M25562 Pain in left knee: Secondary | ICD-10-CM | POA: Diagnosis not present

## 2017-10-17 DIAGNOSIS — M545 Low back pain: Secondary | ICD-10-CM | POA: Diagnosis not present

## 2017-10-17 DIAGNOSIS — M25561 Pain in right knee: Secondary | ICD-10-CM | POA: Diagnosis not present

## 2017-10-17 DIAGNOSIS — M25551 Pain in right hip: Secondary | ICD-10-CM | POA: Diagnosis not present

## 2017-10-17 DIAGNOSIS — M25552 Pain in left hip: Secondary | ICD-10-CM | POA: Diagnosis not present

## 2017-10-24 DIAGNOSIS — M25551 Pain in right hip: Secondary | ICD-10-CM | POA: Diagnosis not present

## 2017-10-24 DIAGNOSIS — M25562 Pain in left knee: Secondary | ICD-10-CM | POA: Diagnosis not present

## 2017-10-24 DIAGNOSIS — M25561 Pain in right knee: Secondary | ICD-10-CM | POA: Diagnosis not present

## 2017-10-24 DIAGNOSIS — M25552 Pain in left hip: Secondary | ICD-10-CM | POA: Diagnosis not present

## 2017-10-24 DIAGNOSIS — M545 Low back pain: Secondary | ICD-10-CM | POA: Diagnosis not present

## 2017-10-25 ENCOUNTER — Other Ambulatory Visit: Payer: Self-pay | Admitting: Internal Medicine

## 2017-10-25 NOTE — Telephone Encounter (Signed)
Please review for refill. Thanks!  

## 2017-11-06 ENCOUNTER — Other Ambulatory Visit: Payer: Self-pay | Admitting: Internal Medicine

## 2017-11-06 NOTE — Telephone Encounter (Signed)
Refill Request.  

## 2017-11-11 ENCOUNTER — Encounter: Payer: Self-pay | Admitting: Cardiovascular Disease

## 2017-11-11 ENCOUNTER — Ambulatory Visit: Payer: PPO | Admitting: Cardiovascular Disease

## 2017-11-11 VITALS — BP 128/64 | HR 60 | Ht 73.0 in | Wt 309.0 lb

## 2017-11-11 DIAGNOSIS — I2510 Atherosclerotic heart disease of native coronary artery without angina pectoris: Secondary | ICD-10-CM

## 2017-11-11 DIAGNOSIS — E782 Mixed hyperlipidemia: Secondary | ICD-10-CM | POA: Diagnosis not present

## 2017-11-11 NOTE — Progress Notes (Signed)
Cardiology Office Note   Date:  11/11/2017   ID:  Brad Woods, DOB 11/14/1946, MRN [id number]  PCP:  Joycelyn Rua, MD  Cardiologist:   Kristeen Miss, MD   Chief Complaint  Patient presents with  . Coronary Artery Disease      History of Present Illness: Brad Woods is a 71 y.o. male who presents for follow up of his CAD.   Seen with wife , Brad Woods  Has been having CP since May. Went to the [facility] , was sent to the hospital May 18, 2016 Was admitted,    At 2 AM , his CP worsened acutely .   Had a stent to his LCx.   ( 90% - 0%)   He wanted to change cardiologist and presents for further management .   Has been having CP similar to his presenting symptom in May. Seem to be worse with exertion, better with rest.   Had another episode of CP last night. Mid sternal CP, radiation down his left arm .  Took a SL NTG  - CP was relieved with NTG .   Has had some shortness of breath - possibly shortly after taking the Brilinta   Also has some belching relieves the CP    does not get any regular exercise. Before the MI, was going several times a week   [name]. 8, 2018:  Brad Woods had his heart catheterization since her last visit. He was found have moderate disease. His stents are patent. He did not require any additional stenting. He was changed from Brilinta to Plavix because of shortness of breath. He seems to be feeling a lot better after that medicine change. Wants to get back into cardiac rehab   May 13, 2017:  Brad Woods is seen for follow up of his CAD. We changed the Brilinta to plavix which helped his dyspnea Rare episodes of CP Has indigestion frequently ,  Belches seem to relieve his CP on occasion .  Exercises regularly  Wt Readings from Last 3 Encounters:  11/11/17 (!) 309 lb (140.2 kg)  05/13/17 296 lb 1.9 oz (134.3 kg)  03/01/17 (!) 300 lb 7.8 oz (136.3 kg)   Wt is down 7 lbs since January .   November 11, 2017:  Brad Woods is seen today for follow-up  visit. Hx of CAD, hyperlipidemia,  Stopped atorvastatin 2-3 weeks ago due due to diffuse muscle aches and pains.  He is feeling much better since stopping the atorvastatin. BP looks good today  Had some atypical CP , related to indigestion / gas pains  Getting some exercise.  Lots of ortho issues.   Past Medical History:  Diagnosis Date  . Allergy   . Arthritis   . Asthma    when younger  . GERD (gastroesophageal reflux disease)   . Hyperlipidemia   . Hypertension   . Kidney stones   . Sleep apnea    cpap    Past Surgical History:  Procedure Laterality Date  . COLONOSCOPY    . EYE SURGERY    . Left Heart Cath and Coronary Angiography N/A 11/22/2016   Performed by Yvonne Kendall, MD at [facility] INVASIVE CV LAB  . Left Heart Cath and Coronary Angiography N/A 05/20/2016   Performed by Runell Gess, MD at [facility] INVASIVE CV LAB  . RECTAL SURGERY    . REPLACEMENT TOTAL KNEE Bilateral      Current Outpatient Medications  Medication Sig Dispense Refill  . acetaminophen (TYLENOL) 500 MG  tablet Take 500 mg by mouth every 6 (six) hours as needed.    Marland Kitchen albuterol (PROVENTIL HFA;VENTOLIN HFA) 108 (90 Base) MCG/ACT inhaler Inhale 2 puffs into the lungs every 6 (six) hours as needed for wheezing or shortness of breath. 1 Inhaler 2  . aspirin EC 81 MG EC tablet Take 1 tablet (81 mg total) by mouth daily.    . carvedilol (COREG) 3.125 MG tablet TAKE 1 TABLET BY MOUTH TWICE DAILY 180 tablet 2  . clopidogrel (PLAVIX) 75 MG tablet Take 75 mg daily by mouth.    . finasteride (PROSCAR) 5 MG tablet Take 5 mg by mouth daily.    . fluticasone (FLONASE) 50 MCG/ACT nasal spray Place 2 sprays into the nose daily as needed for allergies.     . isosorbide mononitrate (IMDUR) 60 MG 24 hr tablet TAKE 1 TABLET (60 MG TOTAL) BY MOUTH DAILY. 30 tablet 6  . Multiple Vitamins-Minerals (CENTRUM SILVER 50+MEN PO) Take 1 tablet by mouth daily.    . nitroGLYCERIN (NITROSTAT) 0.4 MG SL tablet Place 1 tablet (0.4 mg  total) under the tongue every 5 (five) minutes as needed for chest pain. 25 tablet 6  . Omega-3 Fatty Acids (FISH OIL OMEGA-3 PO) Take 1 tablet daily by mouth. OTC FISH OIL    . tamsulosin (FLOMAX) 0.4 MG CAPS capsule Take 0.4 mg by mouth daily.      No current facility-administered medications for this visit.     Allergies:   Hydromorphone hcl and Aleve [naproxen sodium]    Social History:  The patient  reports that  has never smoked. he has never used smokeless tobacco. He reports that he drinks alcohol. He reports that he does not use drugs.   Family History:  The patient's family history includes Colon cancer (age of onset: 49) in his mother.    ROS:  Please see the history of present illness.   Noted in the current history.  All other systems are negative.Marland Kitchen    Physical Exam: Blood pressure 128/64, pulse 60, height 6\' 1"  (1.854 m), weight (!) 309 lb (140.2 kg), SpO2 96 %.  GEN:  Well nourished, well developed in no acute distress HEENT: Normal NECK: No JVD; No carotid bruits LYMPHATICS: No lymphadenopathy CARDIAC: RR, no murmurs, rubs, gallops RESPIRATORY:  Clear to auscultation without rales, wheezing or rhonchi  ABDOMEN: Soft, non-tender, non-distended MUSCULOSKELETAL:  No edema; No deformity  SKIN: Warm and dry NEUROLOGIC:  Alert and oriented x 3    EKG: November 11, 2017: Normal sinus rhythm at 61.  Right bundle branch block.  Possible inferior wall myocardial infarction-old   Recent Labs: 11/20/2016: Hemoglobin 15.7; Platelets 181 05/13/2017: ALT 29; BUN 15; Creatinine, Ser 0.95; Potassium 4.6; Sodium 136    Lipid Panel    Component Value Date/Time   CHOL 120 05/13/2017 1221   TRIG 142 05/13/2017 1221   HDL 34 (L) 05/13/2017 1221   CHOLHDL 3.5 05/13/2017 1221   CHOLHDL 4.0 11/20/2016 1643   VLDL 31 (H) 11/20/2016 1643   LDLCALC 58 05/13/2017 1221      Wt Readings from Last 3 Encounters:  11/11/17 (!) 309 lb (140.2 kg)  05/13/17 296 lb 1.9 oz (134.3  kg)  03/01/17 (!) 300 lb 7.8 oz (136.3 kg)      Other studies Reviewed: Additional studies/ records that were reviewed today include: . Review of the above records demonstrates:    ASSESSMENT AND PLAN:  1.  CAD :     History of  coronary artery disease.  He has not had any episodes of angina.   2. Hyperlipidemia:   .  He stopped his atorvastatin approximately 2 or 3 weeks ago.  We will refer him to lipid clinic for consideration for Repatha.  We will draw fasting lipids at a later time since today's levels will not be a true reflection of being off the atorvastatin.  3. Morbid obesity :   Advised to work on better diet, exercise, and weight loss program.  Current medicines are reviewed at length with the patient today.  The patient does not have concerns regarding medicines.  Labs/ tests ordered today include:  No orders of the defined types were placed in this encounter.    Disposition:   FU with me in 6  months      Kristeen Miss, MD  11/11/2017 10:40 AM    [facility] [address] [place], [place], [place]  [address] Phone: [phone]; Fax: [phone]

## 2017-11-11 NOTE — Patient Instructions (Signed)
Medication Instructions:  Your physician recommends that you continue on your current medications as directed. Please refer to the Current Medication list given to you today.   Labwork: None Ordered   Testing/Procedures: None Ordered   Follow-Up: You have been referred to Lipid Clinic for management of your high cholesterol and intolerance of statin medication   Your physician wants you to follow-up in: 6 months with Dr. Acie Fredrickson.  You will receive a reminder letter in the mail two months in advance. If you don't receive a letter, please call our office to schedule the follow-up appointment.   If you need a refill on your cardiac medications before your next appointment, please call your pharmacy.   Thank you for choosing [facility]! Christen Bame, RN [phone]

## 2017-11-26 ENCOUNTER — Ambulatory Visit (INDEPENDENT_AMBULATORY_CARE_PROVIDER_SITE_OTHER): Payer: PPO | Admitting: Pharmacist

## 2017-11-26 ENCOUNTER — Encounter: Payer: Self-pay | Admitting: Pharmacist

## 2017-11-26 DIAGNOSIS — E782 Mixed hyperlipidemia: Secondary | ICD-10-CM | POA: Diagnosis not present

## 2017-11-26 LAB — LIPID PANEL
CHOL/HDL RATIO: 5.7 ratio — AB (ref 0.0–5.0)
Cholesterol, Total: 194 mg/dL (ref 100–199)
HDL: 34 mg/dL — ABNORMAL LOW (ref >39)
LDL CALC: 132 mg/dL — AB (ref 0–99)
TRIGLYCERIDES: 141 mg/dL (ref 0–149)
VLDL Cholesterol Cal: 28 mg/dL (ref 5–40)

## 2017-11-26 NOTE — Patient Instructions (Signed)
We will get lab work today and I will call you with results.  Based on results we will decide which option will be best for your cholesterol management. Continue to work on diet and exercise!   Cholesterol Cholesterol is a fat. Your body needs a small amount of cholesterol. Cholesterol (plaque) may build up in your blood vessels (arteries). That makes you more likely to have a heart attack or stroke. You cannot feel your cholesterol level. Having a blood test is the only way to find out if your level is high. Keep your test results. Work with your doctor to keep your cholesterol at a good level. What do the results mean?  Total cholesterol is how much cholesterol is in your blood.  LDL is bad cholesterol. This is the type that can build up. Try to have low LDL.  HDL is good cholesterol. It cleans your blood vessels and carries LDL away. Try to have high HDL.  Triglycerides are fat that the body can store or burn for energy. What are good levels of cholesterol?  Total cholesterol below 200.  LDL below 100 is good for people who have health risks. LDL below 70 is good for people who have very high risks.  HDL above 40 is good. It is best to have HDL of 60 or higher.  Triglycerides below 150. How can I lower my cholesterol? Diet Follow your diet program as told by your doctor.  Choose fish, white meat chicken, or [place] that is roasted or baked. Try not to eat red meat, fried foods, sausage, or lunch meats.  Eat lots of fresh fruits and vegetables.  Choose whole grains, beans, pasta, potatoes, and cereals.  Choose olive oil, corn oil, or canola oil. Only use small amounts.  Try not to eat butter, mayonnaise, shortening, or palm kernel oils.  Try not to eat foods with trans fats.  Choose low-fat or nonfat dairy foods. ? Drink skim or nonfat milk. ? Eat low-fat or nonfat yogurt and cheeses. ? Try not to drink whole milk or cream. ? Try not to eat ice cream, egg yolks, or  full-fat cheeses.  Healthy desserts include angel food cake, ginger snaps, animal crackers, hard candy, popsicles, and low-fat or nonfat frozen yogurt. Try not to eat pastries, cakes, pies, and cookies.  Exercise Follow your exercise program as told by your doctor.  Be more active. Try gardening, walking, and taking the stairs.  Ask your doctor about ways that you can be more active.  Medicine  Take over-the-counter and prescription medicines only as told by your doctor.  This information is not intended to replace advice given to you by your health care provider. Make sure you discuss any questions you have with your health care provider. Document Released: 03/08/2009 Document Revised: 07/11/2016 Document Reviewed: 06/21/2016 Elsevier Interactive Patient Education  2017 [employer].

## 2017-11-26 NOTE — Progress Notes (Signed)
Patient ID: Brad Woods                 DOB: May 02, 1946                    MRN: [id number]     HPI: Brad Woods is a 71 y.o. male patient of Dr. Elease Hashimoto that presents today for lipid evaluation.  PMH includes CAD, unstable angina, s/p MI 04/2016. He was seen by Dr. Elease Hashimoto on 11/11/17 and had been off his atorvastatin for 2-3 weeks at that time for muscle aches and pains. He reports feeling much better off medication.   Today he presents and states that he states that when he takes statin he has ached every where. He endorses a small amount of pain from his knees, but states this is dramatically worse when taking statin. 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 He is taking an OTC formulation. He has not tried Zetia.   He reports today that he has had some SOB that comes and goes, but he believes this is related to his eating habits. He reports indigestion and frequent burps and wonders if this could be medication related. He also reports that he has indigestion and "stomach burning" frequently.   Of note he reports he believes it is actually motrin he is using instead of tylenol.   Last NTG about 2 months ago - he believes it was actually indigestion  [facility] - Dr. Pia Mau - PCP who may have records of statin doses.   Risk Factors: CAD, unstable angina, MI LDL Goal: <70  Current Medications: Fish oil 1g daily Intolerances: Crestor, pravastatin, atorvastatin - muscle aches  Diet: His hunger has increased. He reports he eats and 20 minutes later he is hungry again.  B - 2 eggos, canadian bacon and eggs, toast S - sausage mcmuffin L - [employer] chili and salad or burger D - Soup and salad, steak sometimes, mostly chicken  Exercise: Plans to start treadmill and exercise bike.   Family History: The patient's family history includes Colon cancer (age of onset: 89) in his mother.   Social History:  The patient  reports that  has never smoked. he has never used smokeless tobacco. He reports that he drinks alcohol. He reports that he does not use drugs.   Labs: 05/13/17:  TC 120, TG 142, HDL 34, LDL 58 - on atorvastatin 80mg  daily - he reports he was taking sparingly (takes for a few days then skips for a few weeks) 11/26/17:    Past Medical History:  Diagnosis Date  . Allergy   . Arthritis   . Asthma    when younger  . GERD (gastroesophageal reflux disease)   . Hyperlipidemia   . Hypertension   . Kidney stones   . Sleep apnea    cpap    Current Outpatient Medications on File Prior to Visit  Medication Sig Dispense Refill  . acetaminophen (TYLENOL) 500 MG tablet Take 500 mg by mouth every 6 (six) hours as needed.    Marland Kitchen albuterol (PROVENTIL HFA;VENTOLIN HFA) 108 (90 Base) MCG/ACT inhaler Inhale 2 puffs into the lungs every 6 (six) hours as needed for wheezing or shortness of breath. 1 Inhaler 2  . aspirin EC 81 MG EC tablet Take 1 tablet (81 mg total) by mouth daily.    . carvedilol (COREG) 3.125 MG tablet TAKE 1 TABLET BY  MOUTH TWICE DAILY 180 tablet 2  . clopidogrel (PLAVIX) 75 MG tablet Take 75 mg daily by mouth.    . finasteride (PROSCAR) 5 MG tablet Take 5 mg by mouth daily.    . fluticasone (FLONASE) 50 MCG/ACT nasal spray Place 2 sprays into the nose daily as needed for allergies.     . isosorbide mononitrate (IMDUR) 60 MG 24 hr tablet TAKE 1 TABLET (60 MG TOTAL) BY MOUTH DAILY. 30 tablet 6  . Multiple Vitamins-Minerals (CENTRUM SILVER 50+MEN PO) Take 1 tablet by mouth daily.    . nitroGLYCERIN (NITROSTAT) 0.4 MG SL tablet Place 1 tablet (0.4 mg total) under the tongue every 5 (five) minutes as needed for chest pain. 25 tablet 6  . Omega-3 Fatty Acids (FISH OIL OMEGA-3 PO) Take 1 tablet daily by mouth. OTC FISH OIL    . tamsulosin (FLOMAX) 0.4 MG CAPS capsule Take 0.4 mg by mouth daily.      No current facility-administered medications on file prior to visit.      Allergies  Allergen Reactions  . Hydromorphone Hcl Other (See Comments)    "flatlined" - Patient states he was overdosed. He states he isn't allergic.  Marland Kitchen Aleve [Naproxen Sodium] Dermatitis    States he was given aleve in the hospital and it caused welts.     Assessment/Plan: Hyperlipidemia: Lipid panel today - Pending results will pursue zetia vs. PCSK9i therapy. He is going to discontinue Fish oil to see if this helps with burps and indigestion. I also advised he follow up with PCP or ortho for pain management options other than NSAIDs given cardiovascular history and GI issues. Educated patient on PCSK9i therapy, including side effects and injection technique. Pt is comfortable with injectable therapy.      Thank you,  Freddie Apley. Cleatis Polka, PharmD  [facility]  11/26/2017 7:17 AM

## 2017-11-27 ENCOUNTER — Telehealth: Payer: Self-pay | Admitting: Pharmacist

## 2017-11-27 NOTE — Telephone Encounter (Signed)
LMOM for pt. Per visit yesterday with [facility]ana [place], will pursue PCSK9i therapy due to elevated LDL of 132 above goal < 70 due to hx of ASCVD. Zetia therapy will not bring LDL to goal. Will submit PA request for Repatha therapy.

## 2017-11-28 NOTE — Telephone Encounter (Signed)
Pt returned call and is aware of plan.

## 2017-11-29 MED ORDER — EVOLOCUMAB 140 MG/ML ~~LOC~~ SOAJ: 1.0000 "pen " | SUBCUTANEOUS | 11 refills | Status: DC

## 2017-11-29 NOTE — Telephone Encounter (Signed)
Repatha approved through 01/23/18. Rx sent to specialty pharmacy. Will call pt once copay information is available.

## 2017-11-29 NOTE — Addendum Note (Signed)
Addended by: SUPPLE, MEGAN E on: 11/29/2017 10:41 AM   Modules accepted: Orders

## 2017-12-05 NOTE — Telephone Encounter (Signed)
Copay back at $151 per month. This is unaffordable for pt. He will come in tomorrow afternoon to fill out [employer] Application paperwork.

## 2017-12-12 ENCOUNTER — Telehealth: Payer: Self-pay | Admitting: Pharmacist

## 2017-12-12 NOTE — Telephone Encounter (Signed)
Pt presented to clinic for [place] samples since the [place] is not processing applications until January. He successfully self-administered Repatha injection into the abdomen with no adverse effects. Advised pt to contact SNF the 2nd week of January to see if they have processed his application.

## 2018-01-27 DIAGNOSIS — H524 Presbyopia: Secondary | ICD-10-CM | POA: Diagnosis not present

## 2018-01-27 DIAGNOSIS — H5213 Myopia, bilateral: Secondary | ICD-10-CM | POA: Diagnosis not present

## 2018-01-27 DIAGNOSIS — H52223 Regular astigmatism, bilateral: Secondary | ICD-10-CM | POA: Diagnosis not present

## 2018-02-04 NOTE — Telephone Encounter (Signed)
Repatha has been approved through SNF for 2019 year. Pt is aware.

## 2018-03-05 DIAGNOSIS — N400 Enlarged prostate without lower urinary tract symptoms: Secondary | ICD-10-CM | POA: Diagnosis not present

## 2018-03-05 DIAGNOSIS — M1990 Unspecified osteoarthritis, unspecified site: Secondary | ICD-10-CM | POA: Diagnosis not present

## 2018-03-05 DIAGNOSIS — K219 Gastro-esophageal reflux disease without esophagitis: Secondary | ICD-10-CM | POA: Diagnosis not present

## 2018-03-05 DIAGNOSIS — I2510 Atherosclerotic heart disease of native coronary artery without angina pectoris: Secondary | ICD-10-CM | POA: Diagnosis not present

## 2018-03-05 DIAGNOSIS — Z6839 Body mass index (BMI) 39.0-39.9, adult: Secondary | ICD-10-CM | POA: Diagnosis not present

## 2018-03-05 DIAGNOSIS — I10 Essential (primary) hypertension: Secondary | ICD-10-CM | POA: Diagnosis not present

## 2018-03-05 DIAGNOSIS — J309 Allergic rhinitis, unspecified: Secondary | ICD-10-CM | POA: Diagnosis not present

## 2018-03-05 DIAGNOSIS — E7800 Pure hypercholesterolemia, unspecified: Secondary | ICD-10-CM | POA: Diagnosis not present

## 2018-03-05 DIAGNOSIS — J45909 Unspecified asthma, uncomplicated: Secondary | ICD-10-CM | POA: Diagnosis not present

## 2018-05-20 ENCOUNTER — Other Ambulatory Visit: Payer: Self-pay | Admitting: Cardiovascular Disease

## 2018-05-23 ENCOUNTER — Other Ambulatory Visit: Payer: Self-pay | Admitting: Cardiovascular Disease

## 2018-07-29 ENCOUNTER — Emergency Department (HOSPITAL_COMMUNITY): Payer: PPO

## 2018-07-29 ENCOUNTER — Encounter (HOSPITAL_COMMUNITY): Payer: Self-pay

## 2018-07-29 ENCOUNTER — Observation Stay (HOSPITAL_COMMUNITY)
Admission: EM | Admit: 2018-07-29 | Discharge: 2018-07-30 | Disposition: A | Discharge status: Home / Self Care | Payer: PPO | Attending: Internal Medicine | Admitting: Internal Medicine

## 2018-07-29 ENCOUNTER — Other Ambulatory Visit: Payer: Self-pay

## 2018-07-29 DIAGNOSIS — M25512 Pain in left shoulder: Secondary | ICD-10-CM | POA: Diagnosis present

## 2018-07-29 DIAGNOSIS — Z7902 Long term (current) use of antithrombotics/antiplatelets: Secondary | ICD-10-CM | POA: Insufficient documentation

## 2018-07-29 DIAGNOSIS — Z96653 Presence of artificial knee joint, bilateral: Secondary | ICD-10-CM | POA: Insufficient documentation

## 2018-07-29 DIAGNOSIS — R0602 Shortness of breath: Secondary | ICD-10-CM | POA: Diagnosis not present

## 2018-07-29 DIAGNOSIS — I200 Unstable angina: Secondary | ICD-10-CM | POA: Diagnosis not present

## 2018-07-29 DIAGNOSIS — I10 Essential (primary) hypertension: Secondary | ICD-10-CM | POA: Diagnosis not present

## 2018-07-29 DIAGNOSIS — J45909 Unspecified asthma, uncomplicated: Secondary | ICD-10-CM | POA: Insufficient documentation

## 2018-07-29 DIAGNOSIS — Z7982 Long term (current) use of aspirin: Secondary | ICD-10-CM | POA: Diagnosis not present

## 2018-07-29 DIAGNOSIS — R079 Chest pain, unspecified: Secondary | ICD-10-CM | POA: Diagnosis present

## 2018-07-29 DIAGNOSIS — I2510 Atherosclerotic heart disease of native coronary artery without angina pectoris: Principal | ICD-10-CM | POA: Diagnosis present

## 2018-07-29 HISTORY — DX: Acute myocardial infarction, unspecified: I21.9

## 2018-07-29 HISTORY — DX: Headache, unspecified: R51.9

## 2018-07-29 HISTORY — DX: Headache: R51

## 2018-07-29 LAB — BASIC METABOLIC PANEL
ANION GAP: 10 (ref 5–15)
BUN: 17 mg/dL (ref 8–23)
CO2: 25 mmol/L (ref 22–32)
Calcium: 9.3 mg/dL (ref 8.9–10.3)
Chloride: 104 mmol/L (ref 98–111)
Creatinine, Ser: 0.86 mg/dL (ref 0.61–1.24)
GFR calc Af Amer: >60 mL/min (ref >60)
GLUCOSE: 106 mg/dL — AB (ref 70–99)
POTASSIUM: 4.3 mmol/L (ref 3.5–5.1)
Sodium: 139 mmol/L (ref 135–145)

## 2018-07-29 LAB — CBC
HEMATOCRIT: 48.6 % (ref 39.0–52.0)
Hemoglobin: 16.5 g/dL (ref 13.0–17.0)
MCH: 30.7 pg (ref 26.0–34.0)
MCHC: 34 g/dL (ref 30.0–36.0)
MCV: 90.5 fL (ref 78.0–100.0)
Platelets: 169 10*3/uL (ref 150–400)
RBC: 5.37 MIL/uL (ref 4.22–5.81)
RDW: 13.6 % (ref 11.5–15.5)
WBC: 5.5 10*3/uL (ref 4.0–10.5)

## 2018-07-29 LAB — I-STAT TROPONIN, ED: Troponin i, poc: 0 ng/mL (ref 0.00–0.08)

## 2018-07-29 LAB — TROPONIN I: Troponin I: <0.03 ng/mL (ref <0.03)

## 2018-07-29 MED ORDER — ASPIRIN 81 MG PO CHEW
324.0000 mg | CHEWABLE_TABLET | Freq: Once | ORAL | Status: AC
  Administered 2018-07-29: 324 mg via ORAL
  Filled 2018-07-29: qty 4

## 2018-07-29 MED ORDER — ASPIRIN 81 MG PO CHEW
243.0000 mg | CHEWABLE_TABLET | Freq: Once | ORAL | Status: DC
  Filled 2018-07-29: qty 3

## 2018-07-29 MED ORDER — NITROGLYCERIN 0.4 MG SL SUBL
0.4000 mg | SUBLINGUAL_TABLET | SUBLINGUAL | Status: DC | PRN
  Administered 2018-07-29: 0.4 mg via SUBLINGUAL
  Filled 2018-07-29: qty 1

## 2018-07-29 NOTE — ED Notes (Signed)
Bed: WA08 Expected date:  Expected time:  Means of arrival:  Comments: 

## 2018-07-29 NOTE — ED Provider Notes (Signed)
[facility] DEPT Provider Note   CSN: [id number] Arrival date & time: 07/29/18  2230     History   Chief Complaint Chief Complaint  Patient presents with  . Chest Pain  . Arm Pain    HPI Brad Woods is a 72 y.o. male.  HPI  72 year old male with history of CAD, hyperlipidemia, hypertension, OSA and morbid obesity comes in with chief complaint of chest pain and arm pain.  Patient reports that over the past 2 days he has noted some left-sided arm pain.  Patient symptom was intermittent, however since 2 PM today the pain has been constant.  Pain is located in the left shoulder region anteriorly and radiates to the scapular region.  Additionally the pain radiates up to his neck and down towards his elbow.  Patient thinks that his pain is worse with exertion, but unsure.  Patient denies any associated nausea, diaphoresis.  Pain is described as sharp pain.  When patient previously had ACS he did have chest tightness which he does not have at the moment.  Past Medical History:  Diagnosis Date  . Allergy   . Arthritis   . Asthma    when younger  . GERD (gastroesophageal reflux disease)   . Hyperlipidemia   . Hypertension   . Kidney stones   . Sleep apnea    cpap    Patient Active Problem List   Diagnosis Date Noted  . Trochanteric bursitis, left hip 05/02/2017  . Carpal tunnel syndrome, left upper limb 05/02/2017  . Trochanteric bursitis, right hip 03/18/2017  . Mixed hyperlipidemia 12/31/2016  . Coronary artery disease involving native coronary artery of native heart without angina pectoris 11/20/2016  . Elevated troponin   . Unstable angina ([place]) 05/18/2016  . Acute coronary syndrome ([place]) 05/18/2016    Past Surgical History:  Procedure Laterality Date  . CARDIAC CATHETERIZATION N/A 05/20/2016   Procedure: Left Heart Cath and Coronary Angiography;  Surgeon: Lorretta Harp, MD;  Location: [facility];  Service: Cardiovascular;   Laterality: N/A;  . CARDIAC CATHETERIZATION N/A 11/22/2016   Procedure: Left Heart Cath and Coronary Angiography;  Surgeon: Nelva Bush, MD;  Location: [facility];  Service: Cardiovascular;  Laterality: N/A;  . COLONOSCOPY    . EYE SURGERY    . RECTAL SURGERY    . REPLACEMENT TOTAL KNEE Bilateral         Home Medications    Prior to Admission medications   Medication Sig Start Date End Date Taking? Authorizing Provider  albuterol (PROVENTIL HFA;VENTOLIN HFA) 108 (90 Base) MCG/ACT inhaler Inhale 2 puffs into the lungs every 6 (six) hours as needed for wheezing or shortness of breath. 11/20/16  Yes Nahser, Wonda Cheng, MD  aspirin EC 81 MG EC tablet Take 1 tablet (81 mg total) by mouth daily. 05/22/16  Yes Dixie Dials, MD  carvedilol (COREG) 3.125 MG tablet TAKE 1 TABLET BY MOUTH TWICE A DAY 05/20/18  Yes Nahser, Wonda Cheng, MD  clopidogrel (PLAVIX) 75 MG tablet Take 75 mg daily by mouth.   Yes [provider]  Evolocumab (REPATHA SURECLICK) 937 MG/ML SOAJ Inject 1 pen into the skin every 14 (fourteen) days. 11/29/17  Yes Nahser, Wonda Cheng, MD  finasteride (PROSCAR) 5 MG tablet Take 5 mg by mouth daily.   Yes [provider]  fluticasone (FLONASE) 50 MCG/ACT nasal spray Place 2 sprays into the nose daily as needed for allergies.  12/07/13  Yes [provider]  ibuprofen (ADVIL,MOTRIN) 200 MG tablet Take 400 mg by mouth every 6 (six) hours as needed for moderate pain.   Yes [provider]  isosorbide mononitrate (IMDUR) 60 MG 24 hr tablet TAKE 1 TABLET BY MOUTH EVERY DAY 05/23/18  Yes Nahser, Wonda Cheng, MD  Magnesium Oxide (PHILLIPS) 500 MG (LAX) TABS Take 1-3 tablets by mouth every 4 (four) hours as needed (Heart burn).    Yes [provider]  Multiple Vitamins-Minerals (CENTRUM SILVER 50+MEN PO) Take 1 tablet by mouth every other day.    Yes [provider]  nitroGLYCERIN (NITROSTAT) 0.4 MG SL tablet Place 1 tablet (0.4 mg total)  under the tongue every 5 (five) minutes as needed for chest pain. 05/13/17  Yes Nahser, Wonda Cheng, MD    Family History Family History  Problem Relation Age of Onset  . Colon cancer Mother 80    Social History Social History   Tobacco Use  . Smoking status: Never Smoker  . Smokeless tobacco: Never Used  Substance Use Topics  . Alcohol use: Yes    Alcohol/week: 0.0 oz    Comment: occasionally  . Drug use: No     Allergies   Hydromorphone hcl and Aleve [naproxen sodium]   Review of Systems Review of Systems  Constitutional: Positive for activity change.  Respiratory: Positive for shortness of breath.   Cardiovascular: Positive for chest pain.  Skin: Negative for rash.  All other systems reviewed and are negative.    Physical Exam Updated Vital Signs BP (!) 161/84 (BP Location: Left Arm)   Pulse 74   Temp 98 F (36.7 C) (Oral)   Resp 17   Ht 6\' 2"  (1.88 m)   Wt 133.8 kg (295 lb)   SpO2 98%   BMI 37.88 kg/m   Physical Exam  Constitutional: He is oriented to person, place, and time. He appears well-developed.  HENT:  Head: Atraumatic.  Neck: Neck supple.  Cardiovascular: Normal rate, intact distal pulses and normal pulses.  Pulmonary/Chest: Effort normal.  Musculoskeletal:       Right lower leg: He exhibits no tenderness and no edema.       Left lower leg: He exhibits no tenderness and no edema.  Neurological: He is alert and oriented to person, place, and time.  Skin: Skin is warm.  Nursing note and vitals reviewed.    ED Treatments / Results  Labs (all labs ordered are listed, but only abnormal results are displayed) Labs Reviewed  BASIC METABOLIC PANEL - Abnormal; Notable for the following components:      Result Value   Glucose, Bld 106 (*)    All other components within normal limits  CBC  TROPONIN I  TROPONIN I  APTT  PROTIME-INR  I-STAT TROPONIN, ED  CBG MONITORING, ED    EKG EKG Interpretation  Date/Time:  Tuesday July 29 2018  22:34:03 EDT Ventricular Rate:  74 PR Interval:    QRS Duration: 159 QT Interval:  425 QTC Calculation: 472 R Axis:   -32 Text Interpretation:  Sinus rhythm Right bundle branch block Probable inferior infarct, age indeterminate No acute changes Nonspecific ST and T wave abnormality Confirmed by Varney Biles [id number]) on 07/29/2018 10:38:46 PM   Radiology Dg Chest Portable 1 View  Result Date: 07/29/2018 CLINICAL DATA:  Chest pain EXAM: PORTABLE CHEST 1 VIEW COMPARISON:  08/01/2016 FINDINGS: The heart size and mediastinal contours are within normal limits. Both lungs are clear. The visualized skeletal structures are unremarkable. IMPRESSION: No active disease.  Electronically Signed   By: Ulyses Jarred M.D.   On: 07/29/2018 23:27    Procedures Procedures (including critical care time)  CRITICAL CARE Performed by: Brityn Mastrogiovanni   Total critical care time: 36 minutes  Critical care time was exclusive of separately billable procedures and treating other patients.  Critical care was necessary to treat or prevent imminent or life-threatening deterioration.  Critical care was time spent personally by me on the following activities: development of treatment plan with patient and/or surrogate as well as nursing, discussions with consultants, evaluation of patient's response to treatment, examination of patient, obtaining history from patient or surrogate, ordering and performing treatments and interventions, ordering and review of laboratory studies, ordering and review of radiographic studies, pulse oximetry and re-evaluation of patient's condition.   Medications Ordered in ED Medications  nitroGLYCERIN (NITROSTAT) SL tablet 0.4 mg (0.4 mg Sublingual Given 07/29/18 2304)  aspirin chewable tablet 243 mg ( Oral Canceled Entry 07/29/18 2306)  heparin bolus via infusion 4,000 Units (has no administration in time range)  heparin ADULT infusion 100 units/mL (25000 units/233mL sodium chloride 0.45%)  (has no administration in time range)  aspirin chewable tablet 324 mg (324 mg Oral Given 07/29/18 2304)     Initial Impression / Assessment and Plan / ED Course  I have reviewed the triage vital signs and the nursing notes.  Pertinent labs & imaging results that were available during my care of the patient were reviewed by me and considered in my medical decision making (see chart for details).     72 year old male comes in with chief complaint of left-sided chest pain.  Patient has high thoracic pain and left shoulder pain along with radiation to the scapular region and the neck.  Patient has history of CAD and although the current pain is different than his ACS type pain, my suspicion for ischemic etiology is high.  Initial EKG does not have any acute findings. We will get repeat EKG. Appropriate labs have been ordered.  Final Clinical Impressions(s) / ED Diagnoses   Final diagnoses:  Unstable angina [facility])    ED Discharge Orders    None       Varney Biles, MD 07/30/18 0028

## 2018-07-29 NOTE — ED Triage Notes (Signed)
Pt presents to ED from home for chest pain and L arm pain. Pt reports recent MI with stents. Pt reports travel to [place], and has has occasional chest pain while there. Pt reports pain became consistent tonight while flying home.

## 2018-07-29 NOTE — ED Notes (Signed)
Bed: WA05 Expected date:  Expected time:  Means of arrival:  Comments: 

## 2018-07-30 ENCOUNTER — Encounter (HOSPITAL_COMMUNITY): Payer: Self-pay | Admitting: *Deleted

## 2018-07-30 ENCOUNTER — Other Ambulatory Visit: Payer: Self-pay

## 2018-07-30 DIAGNOSIS — I10 Essential (primary) hypertension: Secondary | ICD-10-CM | POA: Diagnosis not present

## 2018-07-30 DIAGNOSIS — Z96653 Presence of artificial knee joint, bilateral: Secondary | ICD-10-CM | POA: Diagnosis not present

## 2018-07-30 DIAGNOSIS — M25512 Pain in left shoulder: Secondary | ICD-10-CM | POA: Diagnosis not present

## 2018-07-30 DIAGNOSIS — R079 Chest pain, unspecified: Secondary | ICD-10-CM | POA: Diagnosis not present

## 2018-07-30 DIAGNOSIS — I2510 Atherosclerotic heart disease of native coronary artery without angina pectoris: Secondary | ICD-10-CM | POA: Diagnosis not present

## 2018-07-30 DIAGNOSIS — J45909 Unspecified asthma, uncomplicated: Secondary | ICD-10-CM | POA: Diagnosis not present

## 2018-07-30 DIAGNOSIS — I200 Unstable angina: Secondary | ICD-10-CM

## 2018-07-30 DIAGNOSIS — Z7902 Long term (current) use of antithrombotics/antiplatelets: Secondary | ICD-10-CM | POA: Diagnosis not present

## 2018-07-30 DIAGNOSIS — Z7982 Long term (current) use of aspirin: Secondary | ICD-10-CM | POA: Diagnosis not present

## 2018-07-30 LAB — PROTIME-INR
INR: 1
Prothrombin Time: 13.1 seconds (ref 11.4–15.2)

## 2018-07-30 LAB — APTT: APTT: 34 s (ref 24–36)

## 2018-07-30 LAB — TROPONIN I: Troponin I: <0.03 ng/mL (ref <0.03)

## 2018-07-30 MED ORDER — HEPARIN BOLUS VIA INFUSION
4000.0000 [IU] | Freq: Once | INTRAVENOUS | Status: DC
  Filled 2018-07-30: qty 4000

## 2018-07-30 MED ORDER — MORPHINE SULFATE (PF) 2 MG/ML IV SOLN: 2.0000 mg | INTRAVENOUS | Status: DC | PRN

## 2018-07-30 MED ORDER — ONDANSETRON HCL 4 MG/2ML IJ SOLN: 4.0000 mg | Freq: Four times a day (QID) | INTRAMUSCULAR | Status: DC | PRN

## 2018-07-30 MED ORDER — FINASTERIDE 5 MG PO TABS
5.0000 mg | ORAL_TABLET | Freq: Every day | ORAL | Status: DC
  Administered 2018-07-30: 5 mg via ORAL
  Filled 2018-07-30: qty 1

## 2018-07-30 MED ORDER — ACETAMINOPHEN 325 MG PO TABS: 650.0000 mg | ORAL_TABLET | ORAL | Status: DC | PRN

## 2018-07-30 MED ORDER — HEPARIN (PORCINE) IN NACL 100-0.45 UNIT/ML-% IJ SOLN
1100.0000 [IU]/h | INTRAMUSCULAR | Status: DC
  Filled 2018-07-30: qty 250

## 2018-07-30 MED ORDER — CARVEDILOL 3.125 MG PO TABS
3.1250 mg | ORAL_TABLET | Freq: Two times a day (BID) | ORAL | Status: DC
  Administered 2018-07-30: 3.125 mg via ORAL
  Filled 2018-07-30: qty 1

## 2018-07-30 MED ORDER — ASPIRIN EC 81 MG PO TBEC
81.0000 mg | DELAYED_RELEASE_TABLET | Freq: Every day | ORAL | Status: DC
  Administered 2018-07-30: 81 mg via ORAL
  Filled 2018-07-30: qty 1

## 2018-07-30 MED ORDER — ENOXAPARIN SODIUM 40 MG/0.4ML ~~LOC~~ SOLN
40.0000 mg | Freq: Every day | SUBCUTANEOUS | Status: DC
  Administered 2018-07-30: 40 mg via SUBCUTANEOUS
  Filled 2018-07-30: qty 0.4

## 2018-07-30 MED ORDER — HEPARIN (PORCINE) IN NACL 100-0.45 UNIT/ML-% IJ SOLN
1200.0000 [IU]/h | INTRAMUSCULAR | Status: DC
  Filled 2018-07-30: qty 250

## 2018-07-30 MED ORDER — ISOSORBIDE MONONITRATE ER 60 MG PO TB24
60.0000 mg | ORAL_TABLET | Freq: Every day | ORAL | Status: DC
  Administered 2018-07-30: 60 mg via ORAL
  Filled 2018-07-30: qty 1

## 2018-07-30 MED ORDER — CLOPIDOGREL BISULFATE 75 MG PO TABS
75.0000 mg | ORAL_TABLET | Freq: Every day | ORAL | Status: DC
  Administered 2018-07-30: 75 mg via ORAL
  Filled 2018-07-30: qty 1

## 2018-07-30 NOTE — ED Notes (Signed)
[facility] at bedside pt ready for transport

## 2018-07-30 NOTE — Consult Note (Addendum)
Cardiology Consultation:   Patient ID: Brad Woods; [id number]; [birth year]   Admit date: 07/29/2018 Date of Consult: 07/30/2018  Primary Care Provider: Joycelyn Rua, MD Primary Cardiologist: Dr. Leodis Sias, MD   Patient Profile:   Brad Woods is a 72 y.o. male with a hx of CAD s/p NSTEMI with PCI to LCX 2017, hyperlipidemia, GERD, hypertension, kidney stones, and sleep apnea on CPAP who is being seen today for the evaluation of atypical chest pain at the request of Dr. Adela Glimpse.   History of Present Illness:   Brad Woods is a 72 year old male with a history stated above who presented to [facility] on 07/29/2018 with intermittent left shoulder/arm pain with radiation to his neck for approximately 2 days which started on a plane ride back from [place]. He states that the pain begins in his epigastric region and he has intermittent belching which improves the discomfort. He reports taking [employer] MOM and ASA with improvement in his symptoms. He states that he states that he has known "issues" with his shoulder and has received injections per Dr. Magnus Woods. He reports the pain may be worse with movement however it is vague. He denies chest pain. recent fatigue, LE swelling, diaphoresis, dizziness, orthopnea, nausea/vomiting or syncope.  He states that he was in [place] for an anniversary and had many diet indiscretions. He reports that his pain with his MI in 2017 was very different that what he is currently experiencing.   In the ED, EKG revealed diffuse nonspecific T wave changes and right BBB similar to prior tracings from 10/2016.  Troponin, negative at<0.03, <0.03.  CXR with no active cardiopulmonary disease. Pt currently resting comfortably.   Initial cardiac catheterization in 04/2016 with successful LCx PCI and drug-eluting stent for non-STEMI and ongoing chest pain with recommendations for dual antiplatelet therapy for at least 12 months. He had recurrent chest pain in 10/2016 with  subsequent cath 10/2016 which revealed diffuse coronary artery disease with patent mid LCx stent and overall appearance similar to cardiac catheterization in 04/2016. Recommendations at that time were to switch from Brilinta to Plavix given patient's considerable shortness of breath since PCI in May 2017.  Of note, he was last seen in the office for follow-up on 11/11/2017 and was doing relatively well.  At that time he had stopped his atorvastatin secondary to diffuse muscle aches and pains and was started on Repatha.  It was noted that he had some atypical chest pain related to indigestion and had multiple orthopedic issues.  Past Medical History:  Diagnosis Date  . Allergy   . Arthritis   . Asthma    when younger  . GERD (gastroesophageal reflux disease)   . Headache   . Hyperlipidemia   . Hypertension   . Kidney stones   . Myocardial infarction (HCC)   . Sleep apnea    cpap    Past Surgical History:  Procedure Laterality Date  . CARDIAC CATHETERIZATION N/A 05/20/2016   Procedure: Left Heart Cath and Coronary Angiography;  Surgeon: Runell Gess, MD;  Location[facility] INVASIVE CV LAB;  Service: Cardiovascular;  Laterality: N/A;  . CARDIAC CATHETERIZATION N/A 11/22/2016   Procedure: Left Heart Cath and Coronary Angiography;  Surgeon: Yvonne Kendall, MD;  Location: [facility] INVASIVE CV LAB;  Service: Cardiovascular;  Laterality: N/A;  . COLONOSCOPY    . EYE SURGERY    . RECTAL SURGERY    . REPLACEMENT TOTAL KNEE Bilateral      Prior to Admission medications  Medication Sig Start Date End Date Taking? Authorizing Provider  albuterol (PROVENTIL HFA;VENTOLIN HFA) 108 (90 Base) MCG/ACT inhaler Inhale 2 puffs into the lungs every 6 (six) hours as needed for wheezing or shortness of breath. 11/20/16  Yes Nahser, Deloris Ping, MD  aspirin EC 81 MG EC tablet Take 1 tablet (81 mg total) by mouth daily. 05/22/16  Yes Orpah Cobb, MD  carvedilol (COREG) 3.125 MG tablet TAKE 1 TABLET BY MOUTH TWICE A  DAY 05/20/18  Yes Nahser, Deloris Ping, MD  clopidogrel (PLAVIX) 75 MG tablet Take 75 mg daily by mouth.   Yes [provider]  Evolocumab (REPATHA SURECLICK) 140 MG/ML SOAJ Inject 1 pen into the skin every 14 (fourteen) days. 11/29/17  Yes Nahser, Deloris Ping, MD  finasteride (PROSCAR) 5 MG tablet Take 5 mg by mouth daily.   Yes [provider]  fluticasone (FLONASE) 50 MCG/ACT nasal spray Place 2 sprays into the nose daily as needed for allergies.  12/07/13  Yes [provider]  ibuprofen (ADVIL,MOTRIN) 200 MG tablet Take 400 mg by mouth every 6 (six) hours as needed for moderate pain.   Yes [provider]  isosorbide mononitrate (IMDUR) 60 MG 24 hr tablet TAKE 1 TABLET BY MOUTH EVERY DAY 05/23/18  Yes Nahser, Deloris Ping, MD  Magnesium Oxide (PHILLIPS) 500 MG (LAX) TABS Take 1-3 tablets by mouth every 4 (four) hours as needed (Heart burn).    Yes [provider]  Multiple Vitamins-Minerals (CENTRUM SILVER 50+MEN PO) Take 1 tablet by mouth every other day.    Yes [provider]  nitroGLYCERIN (NITROSTAT) 0.4 MG SL tablet Place 1 tablet (0.4 mg total) under the tongue every 5 (five) minutes as needed for chest pain. 05/13/17  Yes Nahser, Deloris Ping, MD   Inpatient Medications: Scheduled Meds: . aspirin EC  81 mg Oral Daily  . carvedilol  3.125 mg Oral BID WC  . clopidogrel  75 mg Oral Daily  . enoxaparin (LOVENOX) injection  40 mg Subcutaneous Daily  . finasteride  5 mg Oral Daily  . isosorbide mononitrate  60 mg Oral Daily   Continuous Infusions:  PRN Meds: acetaminophen, morphine injection, nitroGLYCERIN, ondansetron (ZOFRAN) IV  Allergies:    Allergies  Allergen Reactions  . Hydromorphone Hcl Other (See Comments)    "flatlined" - Patient states he was overdosed. He states he isn't allergic.  Marland Kitchen Aleve [Naproxen Sodium] Dermatitis    States he was given aleve in the hospital and it caused welts.    Social History:   Social History    Socioeconomic History  . Marital status: Married    Spouse name: Not on file  . Number of children: Not on file  . Years of education: Not on file  . Highest education level: Not on file  Occupational History  . Not on file  Social Needs  . Financial resource strain: Not on file  . Food insecurity:    Worry: Not on file    Inability: Not on file  . Transportation needs:    Medical: Not on file    Non-medical: Not on file  Tobacco Use  . Smoking status: Never Smoker  . Smokeless tobacco: Never Used  Substance and Sexual Activity  . Alcohol use: Yes    Alcohol/week: 0.0 oz    Comment: occasionally  . Drug use: No  . Sexual activity: Yes    Birth control/protection: None  Lifestyle  . Physical activity:    Days per week: Not on  file    Minutes per session: Not on file  . Stress: Not on file  Relationships  . Social connections:    Talks on phone: Not on file    Gets together: Not on file    Attends religious service: Not on file    Active member of club or organization: Not on file    Attends meetings of clubs or organizations: Not on file    Relationship status: Not on file  . Intimate partner violence:    Fear of current or ex partner: Not on file    Emotionally abused: Not on file    Physically abused: Not on file    Forced sexual activity: Not on file  Other Topics Concern  . Not on file  Social History Narrative  . Not on file    Family History:   Family History  Problem Relation Age of Onset  . Colon cancer Mother 58   Family Status:  Family Status  Relation Name Status  . Mother  Deceased  . Father  Deceased  . MGM  Deceased  . MGF  Deceased  . PGM  Deceased  . PGF  Deceased    ROS:  Please see the history of present illness.  All other ROS reviewed and negative.     Physical Exam/Data:   Vitals:   07/30/18 0210 07/30/18 0230 07/30/18 0300 07/30/18 0343  BP:  (!) 146/65 137/73 (!) 155/81  Pulse:    64  Resp:  18 17 18   Temp: 97.6 F  (36.4 C)   97.6 F (36.4 C)  TempSrc:    Oral  SpO2:  95% 96% 98%  Weight:    (!) 304 lb 9.6 oz (138.2 kg)  Height:    6\' 2"  (1.88 m)   No intake or output data in the 24 hours ending 07/30/18 0745 Filed Weights   07/29/18 2302 07/30/18 0343  Weight: 295 lb (133.8 kg) (!) 304 lb 9.6 oz (138.2 kg)   Body mass index is 39.11 kg/m.   General: Obese, NAD Skin: Warm, dry, intact  Head: Normocephalic, atraumatic, clear, moist mucus membranes. Neck: Negative for carotid bruits. No JVD Lungs:Clear to ausculation bilaterally. No wheezes, rales, or rhonchi. Breathing is unlabored. Cardiovascular: RRR with S1 S2. No murmurs, rubs or gallops Abdomen: Soft, non-tender, non-distended with normoactive bowel sounds. No obvious abdominal masses. MSK: Strength and tone appear normal for age. 5/5 in all extremities Extremities: No edema. No clubbing or cyanosis. DP/PT pulses 2+ bilaterally Neuro: Alert and oriented. No focal deficits. No facial asymmetry. MAE spontaneously. Psych: Responds to questions appropriately with normal affect.     EKG:  The EKG was personally reviewed and demonstrates: 07/29/2018 NSR with no acute ischemic changes with evidence of RBBB Telemetry:  Telemetry was personally reviewed and demonstrates: 07/30/18 NSR with no acute changes  Relevant CV Studies:  ECHO: 05/19/2016: Study Conclusions  - Left ventricle: The cavity size was normal. There was mild   concentric hypertrophy. Systolic function was normal. The   estimated ejection fraction was in the range of 60% to 65%. Wall   motion was normal; there were no regional wall motion   abnormalities. Doppler parameters are consistent with abnormal   left ventricular relaxation (grade 1 diastolic dysfunction). - Left atrium: The atrium was moderately dilated. - Right atrium: The atrium was mildly dilated.  CATH:  05/20/2016: IMPRESSION:successful AV groove circumflex PCI and drug-eluting stent for non-STEMI and  ongoing chest pain. We will continue Angiomaxfor  4 hours at full dose. The patient will continue dual antiplatelet therapy for at least 12 months.  Cardiac catheterization 11/22/2016: Conclusions: 1. Diffuse coronary artery ectasia. 2. Diffusely diseased small PDA, with 70% ostial stenosis and diffuse midvessel disease up to 50%.  Overall appearance is similar to catheterization from 04/2016. 3. Mild to moderate disease involving LAD, diagonal branches, and LCx. 4. Widely patent mid LCx stent. 5. Mildly elevated left ventricular filling pressure. 6. Basal inferior akinesis with otherwise preserved left ventricular contraction (LVEF ~50%).  Recommendations: 1. Aggressive medical therapy, including escalation of isosorbide mononitrate, as tolerated. 2. Will switch from ticagrelor to clopidogrel, given patient's considerable shortness of breath since PCI in May. 3. Continue secondary prevention, as well as weight loss. 4. Follow-up with Dr. Elease Hashimoto, as previously arranged.  Laboratory Data:  Chemistry Recent Labs  Lab 07/29/18 2256  NA 139  K 4.3  CL 104  CO2 25  GLUCOSE 106*  BUN 17  CREATININE 0.86  CALCIUM 9.3  GFRNONAA >60  GFRAA >60  ANIONGAP 10    Total Protein  Date Value Ref Range Status  05/13/2017 6.9 6.0 - 8.5 g/dL Final   Albumin  Date Value Ref Range Status  05/13/2017 4.4 3.5 - 4.8 g/dL Final   AST  Date Value Ref Range Status  05/13/2017 21 0 - 40 IU/L Final   ALT  Date Value Ref Range Status  05/13/2017 29 0 - 44 IU/L Final   Alkaline Phosphatase  Date Value Ref Range Status  05/13/2017 91 39 - 117 IU/L Final   Bilirubin Total  Date Value Ref Range Status  05/13/2017 1.0 0.0 - 1.2 mg/dL Final   Hematology Recent Labs  Lab 07/29/18 2256  WBC 5.5  RBC 5.37  HGB 16.5  HCT 48.6  MCV 90.5  MCH 30.7  MCHC 34.0  RDW 13.6  PLT 169   Cardiac Enzymes Recent Labs  Lab 07/29/18 2256 07/30/18 0140  TROPONINI <0.03 <0.03    Recent Labs    Lab 07/29/18 2308  TROPIPOC 0.00    BNPNo results for input(s): BNP, PROBNP in the last 168 hours.  DDimer No results for input(s): DDIMER in the last 168 hours. TSH: No results found for: TSH Lipids: Lab Results  Component Value Date   CHOL 194 11/26/2017   HDL 34 (L) 11/26/2017   LDLCALC 132 (H) 11/26/2017   TRIG 141 11/26/2017   CHOLHDL 5.7 (H) 11/26/2017   HgbA1c:No results found for: HGBA1C  Radiology/Studies:  Dg Chest Portable 1 View  Result Date: 07/29/2018 CLINICAL DATA:  Chest pain EXAM: PORTABLE CHEST 1 VIEW COMPARISON:  08/01/2016 FINDINGS: The heart size and mediastinal contours are within normal limits. Both lungs are clear. The visualized skeletal structures are unremarkable. IMPRESSION: No active disease. Electronically Signed   By: Deatra Robinson M.D.   On: 07/29/2018 23:27   Assessment and Plan:   1.  Atypical chest pain with known history of CAD s/p PCI to LCX 2017: -Pt presented to [facility] after having intermittent, positional left shoulder pain with neck radiation while flying back from [place]. He denies associated ACS symptoms and that the pain in not comparable to his prior MI. He reports many diet indiscretions while in [place] and has a hx of GERD. He took [employer] and ASA with relief. He has known shoulder issues has received injections per Dr. Magnus Woods in the past. He reported to the ED given his CAD hx.   -Trop, <0.03, <0.03 -EKG with no acute  abnormalities, nonspecific T wave changes, essentially unchanged from prior EKG in 10/2017.  -Currently pain free and resting comfortably  -Continue ASA, Plavix, no statin secondary to significant myalgia -Transitioned from Brilinta to Plavix secondary to significant shortness of breath with Brilinta -Pain does not appear to be cardiac in nature. If enzymes elevate or new symptoms occur, will consider stress test or coronary CT>>will plan for medical management and risk factor reduction. Would have ortho follow for  shoulder concerns and PCP for GERD.    2.  Hyperlipidemia: -Uncontrolled, last LDL 11/26/2017 132 -Patient recently stopped atorvastatin secondary to significant mild lesions -On home Repatha   3.  Hypertension: -Stable, 154/72, 135/81, 137/73 -Continue carvedilol 3.125 mg, isosorbide mononitrate 60 mg   For questions or updates, please contact [facility] Please consult [email or web address] for contact info under Cardiology/STEMI.   Raliegh Ip NP-C [facility] Pager: [phone] 07/30/2018 7:45 AM  Attending Note:   The patient was seen and examined.  Agree with assessment and plan as noted above.  Changes made to the above note as needed.  Patient seen and independently examined with  Brad Chard, NP .   We discussed all aspects of the encounter. I agree with the assessment and plan as stated above.  1.  Atypical chest pain: Brad Woods presents with episodes of atypical chest pain.  His troponins are negative.  His EKG shows no changes. His pain is not at all similar to his previous episodes of angina.   The pain seemed to be relieved with antacids.  His point I think that this is a noncardiac chest pain.  We will anticipate discharging him to home later today.  He has a follow-up appointment with me in October.   I have spent a total of 40 minutes with patient reviewing hospital  notes , telemetry, EKGs, labs and examining patient as well as establishing an assessment and plan that was discussed with the patient. > 50% of time was spent in direct patient care.    Vesta Mixer, Montez Hageman., MD, [facility] 07/30/2018, 10:17 AM [address]  [address] Office [phone] Pager [phone]

## 2018-07-30 NOTE — Discharge Summary (Signed)
Physician Discharge Summary  Brad Woods MRN: [id number] DOB/AGE: Oct 05, 1946 72 y.o.  PCP: Joycelyn Rua, MD   Admit date: 07/29/2018 Discharge date: 07/30/2018  Discharge Diagnoses:    Active Problems:   Coronary artery disease involving native coronary artery of native heart without angina pectoris   Left shoulder pain   Chest pain    Follow-up recommendations Follow-up with PCP in 3-5 days , including all  additional recommended appointments as below Follow-up CBC, CMP in 3-5 days Patient follow-up with Dr. Elease Hashimoto, Deloris Ping, MD in October Recommend outpatient x-rays of the left shoulder, follow with Dr. Magnus Ivan as needed     Allergies as of 07/30/2018      Reactions   Hydromorphone Hcl Other (See Comments)   "flatlined" - Patient states he was overdosed. He states he isn't allergic.   Aleve [naproxen Sodium] Dermatitis   States he was given aleve in the hospital and it caused welts.       Medication List    STOP taking these medications   ibuprofen 200 MG tablet Commonly known as:  ADVIL,MOTRIN     TAKE these medications   albuterol 108 (90 Base) MCG/ACT inhaler Commonly known as:  PROVENTIL HFA;VENTOLIN HFA Inhale 2 puffs into the lungs every 6 (six) hours as needed for wheezing or shortness of breath.   aspirin 81 MG EC tablet Take 1 tablet (81 mg total) by mouth daily.   carvedilol 3.125 MG tablet Commonly known as:  COREG TAKE 1 TABLET BY MOUTH TWICE A DAY   CENTRUM SILVER 50+MEN PO Take 1 tablet by mouth every other day.   clopidogrel 75 MG tablet Commonly known as:  PLAVIX Take 75 mg daily by mouth.   Evolocumab 140 MG/ML Soaj Commonly known as:  REPATHA SURECLICK Inject 1 pen into the skin every 14 (fourteen) days.   finasteride 5 MG tablet Commonly known as:  PROSCAR Take 5 mg by mouth daily.   fluticasone 50 MCG/ACT nasal spray Commonly known as:  FLONASE Place 2 sprays into the nose daily as needed for allergies.   isosorbide  mononitrate 60 MG 24 hr tablet Commonly known as:  IMDUR TAKE 1 TABLET BY MOUTH EVERY DAY   nitroGLYCERIN 0.4 MG SL tablet Commonly known as:  NITROSTAT Place 1 tablet (0.4 mg total) under the tongue every 5 (five) minutes as needed for chest pain.   PHILLIPS 500 MG (LAX) Tabs Generic drug:  Magnesium Oxide Take 1-3 tablets by mouth every 4 (four) hours as needed (Heart burn).        Discharge Condition:  stable  Discharge Instructions Get Medicines reviewed and adjusted: Please take all your medications with you for your next visit with your Primary MD  Please request your Primary MD to go over all hospital tests and procedure/radiological results at the follow up, please ask your Primary MD to get all Hospital records sent to his/her office.  If you experience worsening of your admission symptoms, develop shortness of breath, life threatening emergency, suicidal or homicidal thoughts you must seek medical attention immediately by calling 911 or calling your MD immediately  if symptoms less severe.  You must read complete instructions/literature along with all the possible adverse reactions/side effects for all the Medicines you take and that have been prescribed to you. Take any new Medicines after you have completely understood and accpet all the possible adverse reactions/side effects.   Do not drive when taking Pain medications.   Do not take more than prescribed  Pain, Sleep and Anxiety Medications  Special Instructions: If you have smoked or chewed Tobacco  in the last 2 yrs please stop smoking, stop any regular Alcohol  and or any Recreational drug use.  Wear Seat belts while driving.  Please note  You were cared for by a hospitalist during your hospital stay. Once you are discharged, your primary care physician will handle any further medical issues. Please note that NO REFILLS for any discharge medications will be authorized once you are discharged, as it is imperative  that you return to your primary care physician (or establish a relationship with a primary care physician if you do not have one) for your aftercare needs so that they can reassess your need for medications and monitor your lab values.     Allergies  Allergen Reactions  . Hydromorphone Hcl Other (See Comments)    "flatlined" - Patient states he was overdosed. He states he isn't allergic.  Marland Kitchen Aleve [Naproxen Sodium] Dermatitis    States he was given aleve in the hospital and it caused welts.       Disposition: Discharge disposition: 01-Home or Self Care        Consults:   Cardiology    Significant Diagnostic Studies:  Dg Chest Portable 1 View  Result Date: 07/29/2018 CLINICAL DATA:  Chest pain EXAM: PORTABLE CHEST 1 VIEW COMPARISON:  08/01/2016 FINDINGS: The heart size and mediastinal contours are within normal limits. Both lungs are clear. The visualized skeletal structures are unremarkable. IMPRESSION: No active disease. Electronically Signed   By: Deatra Robinson M.D.   On: 07/29/2018 23:27       Filed Weights   07/29/18 2302 07/30/18 0343  Weight: 133.8 kg (295 lb) (!) 138.2 kg (304 lb 9.6 oz)     Microbiology: No results found for this or any previous visit (from the past 240 hour(s)).     Blood Culture No results found for: SDES, SPECREQUEST, CULT, REPTSTATUS    Labs: Results for orders placed or performed during the hospital encounter of 07/29/18 (from the past 48 hour(s))  Basic metabolic panel     Status: Abnormal   Collection Time: 07/29/18 10:56 PM  Result Value Ref Range   Sodium 139 135 - 145 mmol/L   Potassium 4.3 3.5 - 5.1 mmol/L   Chloride 104 98 - 111 mmol/L   CO2 25 22 - 32 mmol/L   Glucose, Bld 106 (H) 70 - 99 mg/dL   BUN 17 8 - 23 mg/dL   Creatinine, Ser 8.46 0.61 - 1.24 mg/dL   Calcium 9.3 8.9 - 96.2 mg/dL   GFR calc non Af Amer >60 >60 mL/min   GFR calc Af Amer >60 >60 mL/min    Comment: (NOTE) The eGFR has been calculated using the  CKD EPI equation. This calculation has not been validated in all clinical situations. eGFR's persistently <60 mL/min signify possible Chronic Kidney Disease.    Anion gap 10 5 - 15    Comment: Performed at [facility], [address] [place], [place] [address]  CBC     Status: None   Collection Time: 07/29/18 10:56 PM  Result Value Ref Range   WBC 5.5 4.0 - 10.5 K/uL   RBC 5.37 4.22 - 5.81 MIL/uL   Hemoglobin 16.5 13.0 - 17.0 g/dL   HCT 13.2 44.0 - 10.2 %   MCV 90.5 78.0 - 100.0 fL   MCH 30.7 26.0 - 34.0 pg   MCHC 34.0 30.0 - 36.0  g/dL   RDW 72.5 36.6 - 44.0 %   Platelets 169 150 - 400 K/uL    Comment: Performed at [facility], [address], [place], [place] [address]  Troponin I  (0, 3)     Status: None   Collection Time: 07/29/18 10:56 PM  Result Value Ref Range   Troponin I <0.03 <0.03 ng/mL    Comment: Performed at [facility], [address], [place], [place] [address]  I-stat troponin, ED (0, 3)     Status: None   Collection Time: 07/29/18 11:08 PM  Result Value Ref Range   Troponin i, poc 0.00 0.00 - 0.08 ng/mL   Comment 3            Comment: Due to the release kinetics of cTnI, a negative result within the first hours of the onset of symptoms does not rule out myocardial infarction with certainty. If myocardial infarction is still suspected, repeat the test at appropriate intervals.   APTT     Status: None   Collection Time: 07/30/18 12:09 AM  Result Value Ref Range   aPTT 34 24 - 36 seconds    Comment: Performed at [facility], [address], [place], [place] [address]  Protime-INR     Status: None   Collection Time: 07/30/18 12:09 AM  Result Value Ref Range   Prothrombin Time 13.1 11.4 - 15.2 seconds   INR 1.00     Comment: Performed at [facility], [address], [place], [place] [address]  Troponin I  (0, 3)     Status: None   Collection Time: 07/30/18   1:40 AM  Result Value Ref Range   Troponin I <0.03 <0.03 ng/mL    Comment: Performed at [facility], [address], [place], [place] [address]     Lipid Panel     Component Value Date/Time   CHOL 194 11/26/2017 0931   TRIG 141 11/26/2017 0931   HDL 34 (L) 11/26/2017 0931   CHOLHDL 5.7 (H) 11/26/2017 0931   CHOLHDL 4.0 11/20/2016 1643   VLDL 31 (H) 11/20/2016 1643   LDLCALC 132 (H) 11/26/2017 0931     No results found for: HGBA1C   Lab Results  Component Value Date   LDLCALC 132 (H) 11/26/2017   CREATININE 0.86 07/29/2018     HPI   Mr. Tatar is a 72 year old male with a history stated above who presented to [facility] on 07/29/2018 with intermittent left shoulder/arm pain with radiation to his neck for approximately 2 days which started on a plane ride back from [place]. He states that the pain begins in his epigastric region and he has intermittent belching which improves the discomfort. He reports taking [employer] MOM and ASA with improvement in his symptoms. He states that he states that he has known "issues" with his shoulder and has received injections per Dr. Magnus Ivan. He reports the pain may be worse with movement however it is vague. He denies chest pain. recent fatigue, LE swelling, diaphoresis, dizziness, orthopnea, nausea/vomiting or syncope.  He states that he was in [place] for an anniversary and had many diet indiscretions. He reports that his pain with his MI in 2017 was very different that what he is currently experiencing.   In the ED, EKG revealed diffuse nonspecific T wave changes and right BBB similar to prior tracings from 10/2016.  Troponin, negative at<0.03, <0.03.  CXR with no active cardiopulmonary disease. Pt currently  resting comfortably.   Initial cardiac catheterization in 04/2016 with successful LCx PCI and drug-eluting stent for non-STEMI and ongoing chest pain with recommendations for dual antiplatelet therapy for at least 12 months.  He had recurrent chest pain in 10/2016 with subsequent cath 10/2016 which revealed diffuse coronary artery disease with patent mid LCx stent and overall appearance similar to cardiac catheterization in 04/2016. Recommendations at that time were to switch from Brilinta to Plavix given patient's considerable shortness of breath since PCI in May 2017.  Of note, he was last seen in the cardiology office for follow-up on 11/11/2017 and was doing relatively well.  At that time he had stopped his atorvastatin secondary to diffuse muscle aches and pains and was started on Repatha    HOSPITAL COURSE:  1.  Atypical chest pain with known history of CAD s/p PCI to LCX 2017: -Pt presented to [facility]  With positional left shoulder pain with neck radiation while flying back from [place].  not comparable to his prior MI. He reports many diet indiscretions while in [place] and has a hx of GERD. He took [employer] and ASA with relief. He has known shoulder issues has received injections per Dr. Magnus Ivan in the past. He reported to the ED given his CAD hx.   -Trop, <0.03, <0.03 -EKG with no acute abnormalities, nonspecific T wave changes, essentially unchanged from prior EKG in 10/2017.  -Currently pain free and resting comfortably  Low pretest probability for PE -Continue ASA, Plavix, no statin secondary to significant myalgia -Transitioned from Brilinta to Plavix secondary to significant shortness of breath with Brilinta -Pain does not appear to be cardiac in nature. Patient seen by Alvia Grove., MD, chest pain atypical, okay to discharge her cardiac standpoint with follow-up in October    2.  Hyperlipidemia: -Uncontrolled, last LDL 11/26/2017 132 -Patient recently stopped atorvastatin secondary to significant mild lesions -On home Repatha   3.  Hypertension: -Stable, 154/72, 135/81, 137/73 -Continue carvedilol 3.125 mg, isosorbide mononitrate 60 mg    4. Sleep apnea patient states he does not  use CPAP on a regular  basis encourage compliance  Discharge Exam:   Blood pressure (!) 154/72, pulse (!) 56, temperature 97.6 F (36.4 C), temperature source Oral, resp. rate 18, height 6\' 2"  (1.88 m), weight (!) 138.2 kg (304 lb 9.6 oz), SpO2 97 %.  Cardiovascular: RRR with S1 S2. No murmurs, rubs or gallops Abdomen: Soft, non-tender, non-distended with normoactive bowel sounds. No obvious abdominal masses. MSK: Strength and tone appear normal for age. 5/5 in all extremities Extremities: No edema. No clubbing or cyanosis. DP/PT pulses 2+ bilaterally Neuro: Alert and oriented. No focal deficits. No facial asymmetry. MAE spontaneously.      Follow-up Information    Joycelyn Rua, MD. Call.   Specialty:  Family Medicine Why:  Hospital follow-up in 3-5 days Contact information: [address] [place] [address] [phone]        Nahser, Deloris Ping, MD .   Specialty:  Cardiology Contact information: [address] [place] [address] [phone]           Signed: Richarda Overlie 07/30/2018, 10:30 AM      Time needed to  prepare  discharge, discussed with the patient and family 35 minutes

## 2018-07-30 NOTE — H&P (Signed)
Brad Woods ZOX:[id number] DOB: July 09, 1946 DOA: 07/29/2018     PCP: Joycelyn Rua, MD   Outpatient Specialists:   CARDS:   Dr. Elease Hashimoto   Patient arrived to ER on 07/29/18 at 2230  Patient coming from:    home Lives With family    Chief Complaint:  Chief Complaint  Patient presents with  . Chest Pain  . Arm Pain    HPI: Brad Woods is a 72 y.o. male with medical history significant of  CAD arthritis, GERD, HLD, HTN, kidney stones, sleep apnea on CPAP    Presented with left shoulder pain radiating  to left arm he recently traveled to [place]. He has been having occasional left and right shoulder pain. He used to Musician work.   Initially pain was intermittent in the left shoulder that radiates to the scapula neck and down.  no associated nausea or diaphoresis. Got worse as he was sitting on the plane.  Patient stated the pain was sharp no chest tightness. Not similar to prior MI.  Not sure what made it worse or better. HE received some aspirin and that also helped he checked his BP and it was 170/80 that made him worried and he presented to ER.   Regarding pertinent Chronic problems: Status post stenting of his left circumflex on May 2017 Was having more chest pain relieved by nitroglycerin in 2018 and had another heart catheterization done showing moderate disease with patent stents no additional stenting he was changed from Brilinta to Plavix because it was causing him shortness of breath and seemed to be doing better He is currently on baby aspirin Coreg, and Plavix 75 mg  Last year had to stop 80 mg of Lipitor, due to generalized body aches and has been doing better since Cardiology started him on Zetia Was taken Repatha While in ER: Pain improved with nitroglycerin currently chest pain-free The following Work up has been ordered so far:  Orders Placed This Encounter  Procedures  . DG Chest Portable 1 View  . Basic metabolic panel  . CBC  . Troponin I   (0, 3)  . APTT  . Protime-INR  . Cardiac monitoring  . Initiate Carrier Fluid Protocol  . Inpatient consult to Cardiology  . heparin per pharmacy consult  . Consult to hospitalist  . Pulse oximetry, continuous  . CBG monitoring, ED  . I-stat troponin, ED (0, 3)  . EKG 12-Lead  . Repeat EKG  . Insert peripheral IV    Following Medications were ordered in ER: Medications  nitroGLYCERIN (NITROSTAT) SL tablet 0.4 mg (0.4 mg Sublingual Given 07/29/18 2304)  aspirin chewable tablet 243 mg ( Oral Canceled Entry 07/29/18 2306)  heparin bolus via infusion 4,000 Units (has no administration in time range)  heparin ADULT infusion 100 units/mL (25000 units/239mL sodium chloride 0.45%) (has no administration in time range)  aspirin chewable tablet 324 mg (324 mg Oral Given 07/29/18 2304)    Significant initial  Findings: Abnormal Labs Reviewed  BASIC METABOLIC PANEL - Abnormal; Notable for the following components:      Result Value   Glucose, Bld 106 (*)    All other components within normal limits     Na 139 K 4.3  Cr    stable,    Lab Results  Component Value Date   CREATININE 0.86 07/29/2018   CREATININE 0.95 05/13/2017   CREATININE 0.96 11/20/2016      WBC 5.5  HG/HCT   stable,  Component Value Date/Time   HGB 16.5 07/29/2018 2256   HCT 48.6 07/29/2018 2256       Troponin (Point of Care Test) Recent Labs    07/29/18 2308  TROPIPOC 0.00     UA  not ordered     CXR -   NON acute    ECG:  Personally reviewed by me showing: HR : 74 Rhythm:  NSR    no evidence of ischemic changes QTC 472    ED Triage Vitals  Enc Vitals Group     BP 07/29/18 2238 (!) 161/84     Pulse Rate 07/29/18 2238 74     Resp 07/29/18 2238 17     Temp 07/29/18 2238 98 F (36.7 C)     Temp Source 07/29/18 2238 Oral     SpO2 07/29/18 2238 98 %     Weight 07/29/18 2302 295 lb (133.8 kg)     Height 07/29/18 2302 6\' 2"  (1.88 m)     Head Circumference --      Peak Flow --       Pain Score 07/29/18 2247 6     Pain Loc --      Pain Edu? --      Excl. in GC? --   TMAX(24)@       Latest  Blood pressure (!) 161/84, pulse 74, temperature 98 F (36.7 C), temperature source Oral, resp. rate 17, height 6\' 2"  (1.88 m), weight 133.8 kg (295 lb), SpO2 98 %.    ER Provider Called:     Dr. Thea Alken They Recommend admit to medicine heparin was started Will see in AM   Hospitalist was called for admission for unstable angina   Review of Systems:    Pertinent positives include: chest pain  Constitutional:  No weight loss, night sweats, Fevers, chills, fatigue, weight loss  HEENT:  No headaches, Difficulty swallowing,Tooth/dental problems,Sore throat,  No sneezing, itching, ear ache, nasal congestion, post nasal drip,  Cardio-vascular:  No , Orthopnea, PND, anasarca, dizziness, palpitations.no Bilateral lower extremity swelling  GI:  No heartburn, indigestion, abdominal pain, nausea, vomiting, diarrhea, change in bowel habits, loss of appetite, melena, blood in stool, hematemesis Resp:  no shortness of breath at rest. No dyspnea on exertion, No excess mucus, no productive cough, No non-productive cough, No coughing up of blood.No change in color of mucus.No wheezing. Skin:  no rash or lesions. No jaundice GU:  no dysuria, change in color of urine, no urgency or frequency. No straining to urinate.  No flank pain.  Musculoskeletal:  No joint pain or no joint swelling. No decreased range of motion. No back pain.  Psych:  No change in mood or affect. No depression or anxiety. No memory loss.  Neuro: no localizing neurological complaints, no tingling, no weakness, no double vision, no gait abnormality, no slurred speech, no confusion  All systems reviewed and apart from HOPI all are negative  Past Medical History:   Past Medical History:  Diagnosis Date  . Allergy   . Arthritis   . Asthma    when younger  . GERD (gastroesophageal reflux disease)   .  Hyperlipidemia   . Hypertension   . Kidney stones   . Sleep apnea    cpap      Past Surgical History:  Procedure Laterality Date  . CARDIAC CATHETERIZATION N/A 05/20/2016   Procedure: Left Heart Cath and Coronary Angiography;  Surgeon: Runell Gess, MD;  Location: [facility] INVASIVE CV LAB;  Service:  Cardiovascular;  Laterality: N/A;  . CARDIAC CATHETERIZATION N/A 11/22/2016   Procedure: Left Heart Cath and Coronary Angiography;  Surgeon: Yvonne Kendall, MD;  Location: [facility] INVASIVE CV LAB;  Service: Cardiovascular;  Laterality: N/A;  . COLONOSCOPY    . EYE SURGERY    . RECTAL SURGERY    . REPLACEMENT TOTAL KNEE Bilateral     Social History:  Ambulatory   independently      reports that he has never smoked. He has never used smokeless tobacco. He reports that he drinks alcohol. He reports that he does not use drugs.     Family History:   Family History  Problem Relation Age of Onset  . Colon cancer Mother 6    Allergies: Allergies  Allergen Reactions  . Hydromorphone Hcl Other (See Comments)    "flatlined" - Patient states he was overdosed. He states he isn't allergic.  Marland Kitchen Aleve [Naproxen Sodium] Dermatitis    States he was given aleve in the hospital and it caused welts.      Prior to Admission medications   Medication Sig Start Date End Date Taking? Authorizing Provider  albuterol (PROVENTIL HFA;VENTOLIN HFA) 108 (90 Base) MCG/ACT inhaler Inhale 2 puffs into the lungs every 6 (six) hours as needed for wheezing or shortness of breath. 11/20/16  Yes Nahser, Deloris Ping, MD  aspirin EC 81 MG EC tablet Take 1 tablet (81 mg total) by mouth daily. 05/22/16  Yes Orpah Cobb, MD  carvedilol (COREG) 3.125 MG tablet TAKE 1 TABLET BY MOUTH TWICE A DAY 05/20/18  Yes Nahser, Deloris Ping, MD  clopidogrel (PLAVIX) 75 MG tablet Take 75 mg daily by mouth.   Yes [provider]  Evolocumab (REPATHA SURECLICK) 140 MG/ML SOAJ Inject 1 pen into the skin every 14 (fourteen) days. 11/29/17   Yes Nahser, Deloris Ping, MD  finasteride (PROSCAR) 5 MG tablet Take 5 mg by mouth daily.   Yes [provider]  fluticasone (FLONASE) 50 MCG/ACT nasal spray Place 2 sprays into the nose daily as needed for allergies.  12/07/13  Yes [provider]  ibuprofen (ADVIL,MOTRIN) 200 MG tablet Take 400 mg by mouth every 6 (six) hours as needed for moderate pain.   Yes [provider]  isosorbide mononitrate (IMDUR) 60 MG 24 hr tablet TAKE 1 TABLET BY MOUTH EVERY DAY 05/23/18  Yes Nahser, Deloris Ping, MD  Magnesium Oxide (PHILLIPS) 500 MG (LAX) TABS Take 1-3 tablets by mouth every 4 (four) hours as needed (Heart burn).    Yes [provider]  Multiple Vitamins-Minerals (CENTRUM SILVER 50+MEN PO) Take 1 tablet by mouth every other day.    Yes [provider]  nitroGLYCERIN (NITROSTAT) 0.4 MG SL tablet Place 1 tablet (0.4 mg total) under the tongue every 5 (five) minutes as needed for chest pain. 05/13/17  Yes Nahser, Deloris Ping, MD   Physical Exam: Blood pressure (!) 161/84, pulse 74, temperature 98 F (36.7 C), temperature source Oral, resp. rate 17, height 6\' 2"  (1.88 m), weight 133.8 kg (295 lb), SpO2 98 %. 1. General:  in No Acute distress  Chronically ill  -appearing 2. Psychological: Alert and Oriented 3. Head/ENT:   Dry Mucous Membranes                          Head Non traumatic, neck supple  Poor Dentition 4. SKIN:  decreased Skin turgor,  Skin clean Dry and intact no rash 5. Heart: Regular rate and rhythm no  Murmur, no Rub or gallop 6. Lungs: Clear to auscultation bilaterally, no wheezes or crackles   7. Abdomen: Soft,  non-tender, Non distended  obese  bowel sounds present 8. Lower extremities: no clubbing, cyanosis, or   edema 9. Neurologically Grossly intact, moving all 4 extremities equally 10. MSK: Normal range of motion   LABS:     Recent Labs  Lab 07/29/18 2256  WBC 5.5  HGB 16.5  HCT 48.6  MCV 90.5  PLT 169    Basic Metabolic Panel: Recent Labs  Lab 07/29/18 2256  NA 139  K 4.3  CL 104  CO2 25  GLUCOSE 106*  BUN 17  CREATININE 0.86  CALCIUM 9.3      No results for input(s): AST, ALT, ALKPHOS, BILITOT, PROT, ALBUMIN in the last 168 hours. No results for input(s): LIPASE, AMYLASE in the last 168 hours. No results for input(s): AMMONIA in the last 168 hours.    HbA1C: No results for input(s): HGBA1C in the last 72 hours. CBG: No results for input(s): GLUCAP in the last 168 hours.    Urine analysis: No results found for: COLORURINE, APPEARANCEUR, LABSPEC, PHURINE, GLUCOSEU, HGBUR, BILIRUBINUR, KETONESUR, PROTEINUR, UROBILINOGEN, NITRITE, LEUKOCYTESUR     Cultures: No results found for: SDES, SPECREQUEST, CULT, REPTSTATUS   Radiological Exams on Admission: Dg Chest Portable 1 View  Result Date: 07/29/2018 CLINICAL DATA:  Chest pain EXAM: PORTABLE CHEST 1 VIEW COMPARISON:  08/01/2016 FINDINGS: The heart size and mediastinal contours are within normal limits. Both lungs are clear. The visualized skeletal structures are unremarkable. IMPRESSION: No active disease. Electronically Signed   By: Deatra Robinson M.D.   On: 07/29/2018 23:27    Chart has been reviewed    Assessment/Plan  72 y.o. male with medical history significant of  CAD arthritis, GERD, HLD, HTN, kidney stones, sleep apnea on CPAP     Admitted for shoulder pain chest pain equivalent  Present on Admission:  . Left shoulder pain vs Chest pain history atypical suspect musculoskeletal will stop heparin cardiac enzymes at this point unremarkable given extensive coronary artery disease with somewhat atypical presentation in the past spelt transferred to [facility] for further management and evaluation as per cardiology continue to cycle cardiac enzymes observe in telemetry recommend follow-up with orthopedics regarding left shoulder pain . Coronary artery disease involving native coronary artery of native heart without  angina pectoris continue Coreg Plavix and aspirin  Elevated blood pressure currently back to baseline  Hypertension continue home medications  Sleep apnea patient states he does not use CPAP on a regular  basis encourage compliance Other plan as per orders.  DVT prophylaxis:   Lovenox     Code Status:  FULL CODE  as per patient   I had personally discussed CODE STATUS with patient and family  Family Communication:   Family   at  Bedside  plan of care was discussed with Wife,   Disposition Plan:     To home once workup is complete and patient is stable                                        Consults called: Cardiology   aware  Admission status:    obs   Level of care  tele             Brad Woods 07/30/2018, 1:21 AM    [facility]  Pager [phone]   after 2 AM please page floor coverage PA If 7AM-7PM, please contact the day team taking care of the patient  [email or web address]  Password [email or web address]

## 2018-07-30 NOTE — Progress Notes (Signed)
ANTICOAGULATION CONSULT NOTE - Initial Consult  Pharmacy Consult for IV heparin Indication: chest pain/ACS  Allergies  Allergen Reactions  . Hydromorphone Hcl Other (See Comments)    "flatlined" - Patient states he was overdosed. He states he isn't allergic.  Marland Kitchen Aleve [Naproxen Sodium] Dermatitis    States he was given aleve in the hospital and it caused welts.     Patient Measurements: Height: 6\' 2"  (188 cm) Weight: 295 lb (133.8 kg) IBW/kg (Calculated) : 82.2 Heparin Dosing Weight: 97 kg  Vital Signs: Temp: 98 F (36.7 C) (08/06 2238) Temp Source: Oral (08/06 2238) BP: 161/84 (08/06 2238) Pulse Rate: 74 (08/06 2238)  Labs: Recent Labs    07/29/18 2256  HGB 16.5  HCT 48.6  PLT 169  CREATININE 0.86  TROPONINI <0.03    Estimated Creatinine Clearance: 114.6 mL/min (by C-G formula based on SCr of 0.86 mg/dL).   Medical History: Past Medical History:  Diagnosis Date  . Allergy   . Arthritis   . Asthma    when younger  . GERD (gastroesophageal reflux disease)   . Hyperlipidemia   . Hypertension   . Kidney stones   . Sleep apnea    cpap    Medications:  Scheduled:  . aspirin  243 mg Oral Once  . heparin  4,000 Units Intravenous Once   Infusions:  . heparin      Assessment: 34 yoM c/o CP and left-sided arm pain. IV heparin for ACS.  Goal of Therapy:  Heparin level 0.3-0.7 units/ml Monitor platelets by anticoagulation protocol: Yes   Plan:  Baseline aPtt and PT/INR STAT Heparin 4000 unit bolus x1 Start heparin drip at 1200 units/hr Daily CBC/HL Check 1st HL in 8 hours  Lawana Pai R 07/30/2018,12:30 AM

## 2018-07-30 NOTE — ED Notes (Addendum)
Heparin gtt arrived from pharmacy. Verbal order to d/c heparin gtt per Dr Roel Cluck

## 2018-07-30 NOTE — ED Notes (Signed)
ED TO INPATIENT HANDOFF REPORT  Name/Age/Gender Brad Woods 72 y.o. male  Code Status Code Status History    Date Active Date Inactive Code Status Order ID Comments User Context   11/22/2016 0851 11/22/2016 1442 Full Code [id number]  Nelva Bush, MD Inpatient   05/18/2016 2136 05/22/2016 1356 Full Code [id number]  Dixie Dials, MD Inpatient      Home/SNF/Other Home  Chief Complaint Chest Pain  Level of Care/Admitting Diagnosis ED Disposition    ED Disposition Condition [facility] Area: [place] [[id number]]  Level of Care: Telemetry [5]  Diagnosis: Chest pain [259563]  Admitting Physician: Toy Baker [[id number]]  Attending Physician: Toy Baker [[id number]]  PT Class (Do Not Modify): Observation [104]  PT Acc Code (Do Not Modify): Observation [10022]       Medical History Past Medical History:  Diagnosis Date  . Allergy   . Arthritis   . Asthma    when younger  . GERD (gastroesophageal reflux disease)   . Hyperlipidemia   . Hypertension   . Kidney stones   . Sleep apnea    cpap    Allergies Allergies  Allergen Reactions  . Hydromorphone Hcl Other (See Comments)    "flatlined" - Patient states he was overdosed. He states he isn't allergic.  Marland Kitchen Aleve [Naproxen Sodium] Dermatitis    States he was given aleve in the hospital and it caused welts.     IV Location/Drains/Wounds Patient Lines/Drains/Airways Status   Active Line/Drains/Airways    Name:   Placement date:   Placement time:   Site:   Days:   Peripheral IV 07/29/18 Right Forearm   07/29/18    2338    Forearm   1          Labs/Imaging Results for orders placed or performed during the hospital encounter of 07/29/18 (from the past 48 hour(s))  Basic metabolic panel     Status: Abnormal   Collection Time: 07/29/18 10:56 PM  Result Value Ref Range   Sodium 139 135 - 145 mmol/L   Potassium 4.3 3.5 - 5.1 mmol/L   Chloride 104 98 - 111 mmol/L   CO2 25  22 - 32 mmol/L   Glucose, Bld 106 (H) 70 - 99 mg/dL   BUN 17 8 - 23 mg/dL   Creatinine, Ser 0.86 0.61 - 1.24 mg/dL   Calcium 9.3 8.9 - 10.3 mg/dL   GFR calc non Af Amer >60 >60 mL/min   GFR calc Af Amer >60 >60 mL/min    Comment: (NOTE) The eGFR has been calculated using the CKD EPI equation. This calculation has not been validated in all clinical situations. eGFR's persistently <60 mL/min signify possible Chronic Kidney Disease.    Anion gap 10 5 - 15    Comment: Performed at [facility], [place] [address], [place], [place] [address]  CBC     Status: None   Collection Time: 07/29/18 10:56 PM  Result Value Ref Range   WBC 5.5 4.0 - 10.5 K/uL   RBC 5.37 4.22 - 5.81 MIL/uL   Hemoglobin 16.5 13.0 - 17.0 g/dL   HCT 48.6 39.0 - 52.0 %   MCV 90.5 78.0 - 100.0 fL   MCH 30.7 26.0 - 34.0 pg   MCHC 34.0 30.0 - 36.0 g/dL   RDW 13.6 11.5 - 15.5 %   Platelets 169 150 - 400 K/uL    Comment: Performed at [facility], [address] [place], [place] [address]  Troponin I  (0, 3)     Status: None   Collection Time: 07/29/18 10:56 PM  Result Value Ref Range   Troponin I <0.03 <0.03 ng/mL    Comment: Performed at [facility], [place] [address], [place], [place] [address]  I-stat troponin, ED (0, 3)     Status: None   Collection Time: 07/29/18 11:08 PM  Result Value Ref Range   Troponin i, poc 0.00 0.00 - 0.08 ng/mL   Comment 3            Comment: Due to the release kinetics of cTnI, a negative result within the first hours of the onset of symptoms does not rule out myocardial infarction with certainty. If myocardial infarction is still suspected, repeat the test at appropriate intervals.    Dg Chest Portable 1 View  Result Date: 07/29/2018 CLINICAL DATA:  Chest pain EXAM: PORTABLE CHEST 1 VIEW COMPARISON:  08/01/2016 FINDINGS: The heart size and mediastinal contours are within normal limits. Both lungs are clear. The visualized  skeletal structures are unremarkable. IMPRESSION: No active disease. Electronically Signed   By: Ulyses Jarred M.D.   On: 07/29/2018 23:27    Pending Labs Unresulted Labs (From admission, onward)   Start     Ordered   07/30/18 0009  APTT  STAT,   R     07/30/18 0008   07/30/18 0009  Protime-INR  STAT,   R     07/30/18 0008   07/29/18 2240  Troponin I  (0, 3)  Now then every 3 hours,   STAT     07/29/18 2239      Vitals/Pain Today's Vitals   07/30/18 0130 07/30/18 0137 07/30/18 0200 07/30/18 0210  BP: 129/76  (!) 145/68   Pulse:      Resp: 17  16   Temp:    97.6 F (36.4 C)  TempSrc:      SpO2: 94%  96%   Weight:      Height:      PainSc:  0-No pain  0-No pain    Isolation Precautions No active isolations  Medications Medications  nitroGLYCERIN (NITROSTAT) SL tablet 0.4 mg (0.4 mg Sublingual Given 07/29/18 2304)  aspirin chewable tablet 243 mg ( Oral Canceled Entry 07/29/18 2306)  aspirin chewable tablet 324 mg (324 mg Oral Given 07/29/18 2304)    Mobility walks

## 2018-07-30 NOTE — Progress Notes (Signed)
Discharge instruction was given to pt.  Lamiya Naas, RN  

## 2018-07-31 ENCOUNTER — Other Ambulatory Visit: Payer: Self-pay | Admitting: Internal Medicine

## 2018-07-31 NOTE — Telephone Encounter (Signed)
Please review for refill. Thanks!  

## 2018-08-04 ENCOUNTER — Other Ambulatory Visit: Payer: Self-pay | Admitting: Cardiovascular Disease

## 2018-08-04 DIAGNOSIS — R0789 Other chest pain: Secondary | ICD-10-CM | POA: Diagnosis not present

## 2018-08-05 ENCOUNTER — Ambulatory Visit: Payer: PPO | Admitting: Physician Assistant

## 2018-08-12 NOTE — Progress Notes (Signed)
Cardiology Office Note:    Date:  08/15/2018   ID:  Brad Woods, DOB 1946-07-10, MRN [id number]  PCP:  Joycelyn Rua, MD  Cardiologist:  Kristeen Miss, MD  Referring MD: Joycelyn Rua, MD   Chief Complaint  Patient presents with  . Follow-up    CAD    History of Present Illness:    Brad Woods is a 72 y.o. male with a past medical history significant for CAD S/P NSTEMI with PCI to LCx 2017, hyperlipidemia, GERD, hypertension, kidney stones and sleep apnea on CPAP.  Was recently seen at the [facility] emergency department 07/29/18 for complaints of intermittent left shoulder/arm pain with radiation to his neck that began on a plane ride from [place].  His symptoms were felt to be atypical for acute coronary syndrome and not like his previous angina.  His troponins were negative.  His EKG was without changes.  He was seen by Dr. Elease Hashimoto who felt like this was noncardiac chest pain.  He is here today alone for follow up of atypical chest pain. He had been taking a lot of ibuprofen for joint pain that was aggravated by increased activity while traveling and plane ride. He stopped the NSAIDs and is avoiding spicy foods and has had no further chest pain. He has chronic DOE with climbing stairs that is longstanding, he relates to asthma. He only has to use his inhaler occasionally. No orthopnea, PND or edema, palpitations, lightheadedness or syncope.   He does some stretching exercise, but no cardiovascular exercise. He just bought a treadmill and plans to start using it. He will start slowly and increase his time and intensity slowly over time.    Past Medical History:  Diagnosis Date  . Allergy   . Arthritis   . Asthma    when younger  . GERD (gastroesophageal reflux disease)   . Headache   . Hyperlipidemia   . Hypertension   . Kidney stones   . Myocardial infarction (HCC)   . Sleep apnea    cpap    Past Surgical History:  Procedure Laterality Date  . CARDIAC  CATHETERIZATION N/A 05/20/2016   Procedure: Left Heart Cath and Coronary Angiography;  Surgeon: Runell Gess, MD;  Location: [facility] INVASIVE CV LAB;  Service: Cardiovascular;  Laterality: N/A;  . CARDIAC CATHETERIZATION N/A 11/22/2016   Procedure: Left Heart Cath and Coronary Angiography;  Surgeon: Yvonne Kendall, MD;  Location: [facility] INVASIVE CV LAB;  Service: Cardiovascular;  Laterality: N/A;  . COLONOSCOPY    . EYE SURGERY    . RECTAL SURGERY    . REPLACEMENT TOTAL KNEE Bilateral     Current Medications: Current Meds  Medication Sig  . albuterol (PROVENTIL HFA;VENTOLIN HFA) 108 (90 Base) MCG/ACT inhaler Inhale 2 puffs into the lungs every 6 (six) hours as needed for wheezing or shortness of breath.  Marland Kitchen aspirin EC 81 MG EC tablet Take 1 tablet (81 mg total) by mouth daily.  . carvedilol (COREG) 3.125 MG tablet TAKE 1 TABLET BY MOUTH TWICE A DAY  . clopidogrel (PLAVIX) 75 MG tablet TAKE 4 TABS X 1 DAY,THEN 1 TAB DAILY THERE AFTER  . Evolocumab (REPATHA SURECLICK) 140 MG/ML SOAJ Inject 1 pen into the skin every 14 (fourteen) days.  . finasteride (PROSCAR) 5 MG tablet Take 5 mg by mouth daily.  . fluticasone (FLONASE) 50 MCG/ACT nasal spray Place 2 sprays into the nose daily as needed for allergies.   . isosorbide mononitrate (IMDUR) 60 MG 24 hr  tablet TAKE 1 TABLET BY MOUTH EVERY DAY  . Magnesium Oxide (PHILLIPS) 500 MG (LAX) TABS Take 1-3 tablets by mouth every 4 (four) hours as needed (Heart burn).   . Multiple Vitamins-Minerals (CENTRUM SILVER 50+MEN PO) Take 1 tablet by mouth every other day.   . nitroGLYCERIN (NITROSTAT) 0.4 MG SL tablet PLACE 1 TABLET (0.4 MG TOTAL) UNDER THE TONGUE EVERY 5 (FIVE) MINUTES AS NEEDED FOR CHEST PAIN.  . tamsulosin (FLOMAX) 0.4 MG CAPS capsule Take 1 capsule by mouth daily.     Allergies:   Hydromorphone hcl and Aleve [naproxen sodium]   Social History   Socioeconomic History  . Marital status: Married    Spouse name: Not on file  . Number of  children: Not on file  . Years of education: Not on file  . Highest education level: Not on file  Occupational History  . Not on file  Social Needs  . Financial resource strain: Not on file  . Food insecurity:    Worry: Not on file    Inability: Not on file  . Transportation needs:    Medical: Not on file    Non-medical: Not on file  Tobacco Use  . Smoking status: Never Smoker  . Smokeless tobacco: Never Used  Substance and Sexual Activity  . Alcohol use: Yes    Alcohol/week: 0.0 standard drinks    Comment: occasionally  . Drug use: No  . Sexual activity: Yes    Birth control/protection: None  Lifestyle  . Physical activity:    Days per week: Not on file    Minutes per session: Not on file  . Stress: Not on file  Relationships  . Social connections:    Talks on phone: Not on file    Gets together: Not on file    Attends religious service: Not on file    Active member of club or organization: Not on file    Attends meetings of clubs or organizations: Not on file    Relationship status: Not on file  Other Topics Concern  . Not on file  Social History Narrative  . Not on file     Family History: The patient's family history includes Colon cancer (age of onset: 55) in his mother. ROS:   Please see the history of present illness.     All other systems reviewed and are negative.  EKGs/Labs/Other Studies Reviewed:    The following studies were reviewed today:  ECHO 05/19/2016: Study Conclusions - Left ventricle: The cavity size was normal. There was mild concentric hypertrophy. Systolic function was normal. The estimated ejection fraction was in the range of 60% to 65%. Wall motion was normal; there were no regional wall motion abnormalities. Doppler parameters are consistent with abnormal left ventricular relaxation (grade 1 diastolic dysfunction). - Left atrium: The atrium was moderately dilated. - Right atrium: The atrium was mildly dilated.  CATH  05/20/2016: IMPRESSION:successful AV groove circumflex PCI and drug-eluting stent for non-STEMI and ongoing chest pain. We will continue Angiomaxfor 4 hours at full dose. The patient will continue dual antiplatelet therapy for at least 12 months.  Cath 11/22/2016: Conclusions: 1. Diffuse coronary artery ectasia. 2. Diffusely diseased small PDA, with 70% ostial stenosis and diffuse midvessel disease up to 50%. Overall appearance is similar to catheterization from 04/2016. 3. Mild to moderate disease involving LAD, diagonal branches, and LCx. 4. Widely patent mid LCx stent. 5. Mildly elevated left ventricular filling pressure. 6. Basal inferior akinesis with otherwise preserved left ventricular  contraction (LVEF ~50%).  Recommendations: 1. Aggressive medical therapy, including escalation of isosorbide mononitrate, as tolerated. 2. Will switch from ticagrelor to clopidogrel, given patient's considerable shortness of breath since PCI in May. 3. Continue secondary prevention, as well as weight loss. 4. Follow-up with Dr. Elease Hashimoto, as previously arranged.   EKG:  EKG is not ordered today.    Recent Labs: 07/29/2018: BUN 17; Creatinine, Ser 0.86; Hemoglobin 16.5; Platelets 169; Potassium 4.3; Sodium 139   Recent Lipid Panel    Component Value Date/Time   CHOL 114 08/15/2018 0918   TRIG 176 (H) 08/15/2018 0918   HDL 32 (L) 08/15/2018 0918   CHOLHDL 3.6 08/15/2018 0918   CHOLHDL 4.0 11/20/2016 1643   VLDL 31 (H) 11/20/2016 1643   LDLCALC 47 08/15/2018 0918    Physical Exam:    VS:  BP 124/70   Pulse 71   Ht 6\' 2"  (1.88 m)   Wt 300 lb (136.1 kg)   SpO2 96%   BMI 38.52 kg/m     Wt Readings from Last 3 Encounters:  08/15/18 300 lb (136.1 kg)  07/30/18 (!) 304 lb 9.6 oz (138.2 kg)  11/11/17 (!) 309 lb (140.2 kg)     Physical Exam  Constitutional: He is oriented to person, place, and time. He appears well-developed and well-nourished. No distress.  HENT:  Head: Normocephalic  and atraumatic.  Neck: Normal range of motion. Neck supple. No JVD present.  Cardiovascular: Normal rate, regular rhythm, normal heart sounds and intact distal pulses. Exam reveals no gallop and no friction rub.  No murmur heard. Pulmonary/Chest: Effort normal and breath sounds normal. No respiratory distress. He has no wheezes. He has no rales.  Abdominal: Soft. Bowel sounds are normal. He exhibits no distension.  Musculoskeletal: Normal range of motion. He exhibits no edema or deformity.  Neurological: He is alert and oriented to person, place, and time.  Skin: Skin is warm and dry.  Psychiatric: He has a normal mood and affect. His behavior is normal. Judgment and thought content normal.  Vitals reviewed.   ASSESSMENT:    1. Coronary artery disease involving native coronary artery of native heart without angina pectoris   2. Mixed hyperlipidemia   3. Essential hypertension   4. PVD (peripheral vascular disease) (HCC)    PLAN:    In order of problems listed above:  CAD:  Aspirin, Plavix (patient had shortness of breath with Brilinta), no statin secondary to significant myalgia, on PCSK9-I. No anginal symptoms. Continue current therapy.   Hypertension: -Carvedilol 3.125 mg twice daily, Imdur 60 mg daily. BP well controlled. Continue current therapy.  -DASH diet.   Hyperlipidemia: Patient has been started on Repatha via our lipid clinic.  He has had no problems obtain his medications and has taken it regularly every 2 weeks.  LDL in 11/2017 was 132. Will recheck lipid panel.   PVD:  Leg cramps but no activity claudication. Lack of hair on lower legs, brown discoloration. No wounds. He was never a smoker. Not diabetic. He is already being treated with recommended therapy, aspirin, statin, plavix. I have advised him on heart healthy diet and exercise. If he develops claudication, would assess ABI's  Medication Adjustments/Labs and Tests Ordered: Current medicines are reviewed at  length with the patient today.  Concerns regarding medicines are outlined above. Labs and tests ordered and medication changes are outlined in the patient instructions below:  Patient Instructions  Medication Instructions: Your physician recommends that you continue on your current medications  as directed. Please refer to the Current Medication list given to you today.   Labwork: TODAY: LIPIDS  Procedures/Testing: None Ordered  Follow-Up: Your physician wants you to follow-up in: 6 months with Dr.Nahser You will receive a reminder letter in the mail two months in advance. If you don't receive a letter, please call our office to schedule the follow-up appointment.   Any Additional Special Instructions Will Be Listed Below (If Applicable).   DASH Eating Plan DASH stands for "Dietary Approaches to Stop Hypertension." The DASH eating plan is a healthy eating plan that has been shown to reduce high blood pressure (hypertension). It may also reduce your risk for type 2 diabetes, heart disease, and stroke. The DASH eating plan may also help with weight loss. What are tips for following this plan? General guidelines  Avoid eating more than 2,300 mg (milligrams) of salt (sodium) a day. If you have hypertension, you may need to reduce your sodium intake to 1,500 mg a day.  Limit alcohol intake to no more than 1 drink a day for nonpregnant women and 2 drinks a day for men. One drink equals 12 oz of beer, 5 oz of wine, or 1 oz of hard liquor.  Work with your health care provider to maintain a healthy body weight or to lose weight. Ask what an ideal weight is for you.  Get at least 30 minutes of exercise that causes your heart to beat faster (aerobic exercise) most days of the week. Activities may include walking, swimming, or biking.  Work with your health care provider or diet and nutrition specialist (dietitian) to adjust your eating plan to your individual calorie needs. Reading food  labels  Check food labels for the amount of sodium per serving. Choose foods with less than 5 percent of the Daily Value of sodium. Generally, foods with less than 300 mg of sodium per serving fit into this eating plan.  To find whole grains, look for the word "whole" as the first word in the ingredient list. Shopping  Buy products labeled as "low-sodium" or "no salt added."  Buy fresh foods. Avoid canned foods and premade or frozen meals. Cooking  Avoid adding salt when cooking. Use salt-free seasonings or herbs instead of table salt or sea salt. Check with your health care provider or pharmacist before using salt substitutes.  Do not fry foods. Cook foods using healthy methods such as baking, boiling, grilling, and broiling instead.  Cook with heart-healthy oils, such as olive, canola, soybean, or sunflower oil. Meal planning   Eat a balanced diet that includes: ? 5 or more servings of fruits and vegetables each day. At each meal, try to fill half of your plate with fruits and vegetables. ? Up to 6-8 servings of whole grains each day. ? Less than 6 oz of lean meat, poultry, or fish each day. A 3-oz serving of meat is about the same size as a deck of cards. One egg equals 1 oz. ? 2 servings of low-fat dairy each day. ? A serving of nuts, seeds, or beans 5 times each week. ? Heart-healthy fats. Healthy fats called Omega-3 fatty acids are found in foods such as flaxseeds and coldwater fish, like sardines, salmon, and mackerel.  Limit how much you eat of the following: ? Canned or prepackaged foods. ? Food that is high in trans fat, such as fried foods. ? Food that is high in saturated fat, such as fatty meat. ? Sweets, desserts, sugary drinks, and other  foods with added sugar. ? Full-fat dairy products.  Do not salt foods before eating.  Try to eat at least 2 vegetarian meals each week.  Eat more home-cooked food and less restaurant, buffet, and fast food.  When eating at a  restaurant, ask that your food be prepared with less salt or no salt, if possible. What foods are recommended? The items listed may not be a complete list. Talk with your dietitian about what dietary choices are best for you. Grains Whole-grain or whole-wheat bread. Whole-grain or whole-wheat pasta. Brown rice. Orpah Cobb. Bulgur. Whole-grain and low-sodium cereals. Pita bread. Low-fat, low-sodium crackers. Whole-wheat flour tortillas. Vegetables Fresh or frozen vegetables (raw, steamed, roasted, or grilled). Low-sodium or reduced-sodium tomato and vegetable juice. Low-sodium or reduced-sodium tomato sauce and tomato paste. Low-sodium or reduced-sodium canned vegetables. Fruits All fresh, dried, or frozen fruit. Canned fruit in natural juice (without added sugar). Meat and other protein foods Skinless chicken or [place]. Ground chicken or [place]. Pork with fat trimmed off. Fish and seafood. Egg whites. Dried beans, peas, or lentils. Unsalted nuts, nut butters, and seeds. Unsalted canned beans. Lean cuts of beef with fat trimmed off. Low-sodium, lean deli meat. Dairy Low-fat (1%) or fat-free (skim) milk. Fat-free, low-fat, or reduced-fat cheeses. Nonfat, low-sodium ricotta or cottage cheese. Low-fat or nonfat yogurt. Low-fat, low-sodium cheese. Fats and oils Soft margarine without trans fats. Vegetable oil. Low-fat, reduced-fat, or light mayonnaise and salad dressings (reduced-sodium). Canola, safflower, olive, soybean, and sunflower oils. Avocado. Seasoning and other foods Herbs. Spices. Seasoning mixes without salt. Unsalted popcorn and pretzels. Fat-free sweets. What foods are not recommended? The items listed may not be a complete list. Talk with your dietitian about what dietary choices are best for you. Grains Baked goods made with fat, such as croissants, muffins, or some breads. Dry pasta or rice meal packs. Vegetables Creamed or fried vegetables. Vegetables in a cheese sauce.  Regular canned vegetables (not low-sodium or reduced-sodium). Regular canned tomato sauce and paste (not low-sodium or reduced-sodium). Regular tomato and vegetable juice (not low-sodium or reduced-sodium). Rosita Fire. Olives. Fruits Canned fruit in a light or heavy syrup. Fried fruit. Fruit in cream or butter sauce. Meat and other protein foods Fatty cuts of meat. Ribs. Fried meat. Tomasa Blase. Sausage. Bologna and other processed lunch meats. Salami. Fatback. Hotdogs. Bratwurst. Salted nuts and seeds. Canned beans with added salt. Canned or smoked fish. Whole eggs or egg yolks. Chicken or [place] with skin. Dairy Whole or 2% milk, cream, and half-and-half. Whole or full-fat cream cheese. Whole-fat or sweetened yogurt. Full-fat cheese. Nondairy creamers. Whipped toppings. Processed cheese and cheese spreads. Fats and oils Butter. Stick margarine. Lard. Shortening. Ghee. Bacon fat. Tropical oils, such as coconut, palm kernel, or palm oil. Seasoning and other foods Salted popcorn and pretzels. Onion salt, garlic salt, seasoned salt, table salt, and sea salt. Worcestershire sauce. Tartar sauce. Barbecue sauce. Teriyaki sauce. Soy sauce, including reduced-sodium. Steak sauce. Canned and packaged gravies. Fish sauce. Oyster sauce. Cocktail sauce. Horseradish that you find on the shelf. Ketchup. Mustard. Meat flavorings and tenderizers. Bouillon cubes. Hot sauce and Tabasco sauce. Premade or packaged marinades. Premade or packaged taco seasonings. Relishes. Regular salad dressings. Where to find more information:  National Heart, Lung, and Blood Institute: PopSteam.is  American Heart Association: www.heart.org Summary  The DASH eating plan is a healthy eating plan that has been shown to reduce high blood pressure (hypertension). It may also reduce your risk for type 2 diabetes, heart disease, and stroke.  With  the DASH eating plan, you should limit salt (sodium) intake to 2,300 mg a day. If you have  hypertension, you may need to reduce your sodium intake to 1,500 mg a day.  When on the DASH eating plan, aim to eat more fresh fruits and vegetables, whole grains, lean proteins, low-fat dairy, and heart-healthy fats.  Work with your health care provider or diet and nutrition specialist (dietitian) to adjust your eating plan to your individual calorie needs. This information is not intended to replace advice given to you by your health care provider. Make sure you discuss any questions you have with your health care provider. Document Released: 11/29/2011 Document Revised: 12/03/2016 Document Reviewed: 12/03/2016 Elsevier Interactive Patient Education  [employer].    If you need a refill on your cardiac medications before your next appointment, please call your pharmacy.      Signed, Berton Bon, NP  08/15/2018 5:17 PM    [facility]

## 2018-08-15 ENCOUNTER — Encounter: Payer: Self-pay | Admitting: Cardiology

## 2018-08-15 ENCOUNTER — Ambulatory Visit (INDEPENDENT_AMBULATORY_CARE_PROVIDER_SITE_OTHER): Payer: PPO | Admitting: Cardiology

## 2018-08-15 VITALS — BP 124/70 | HR 71 | Ht 74.0 in | Wt 300.0 lb

## 2018-08-15 DIAGNOSIS — E782 Mixed hyperlipidemia: Secondary | ICD-10-CM | POA: Diagnosis not present

## 2018-08-15 DIAGNOSIS — M1990 Unspecified osteoarthritis, unspecified site: Secondary | ICD-10-CM | POA: Insufficient documentation

## 2018-08-15 DIAGNOSIS — I2510 Atherosclerotic heart disease of native coronary artery without angina pectoris: Secondary | ICD-10-CM | POA: Diagnosis not present

## 2018-08-15 DIAGNOSIS — I739 Peripheral vascular disease, unspecified: Secondary | ICD-10-CM | POA: Diagnosis not present

## 2018-08-15 DIAGNOSIS — G43909 Migraine, unspecified, not intractable, without status migrainosus: Secondary | ICD-10-CM | POA: Insufficient documentation

## 2018-08-15 DIAGNOSIS — I10 Essential (primary) hypertension: Secondary | ICD-10-CM | POA: Diagnosis not present

## 2018-08-15 DIAGNOSIS — J309 Allergic rhinitis, unspecified: Secondary | ICD-10-CM | POA: Insufficient documentation

## 2018-08-15 DIAGNOSIS — Z87442 Personal history of urinary calculi: Secondary | ICD-10-CM | POA: Insufficient documentation

## 2018-08-15 DIAGNOSIS — E785 Hyperlipidemia, unspecified: Secondary | ICD-10-CM | POA: Insufficient documentation

## 2018-08-15 DIAGNOSIS — N529 Male erectile dysfunction, unspecified: Secondary | ICD-10-CM | POA: Insufficient documentation

## 2018-08-15 LAB — LIPID PANEL
CHOLESTEROL TOTAL: 114 mg/dL (ref 100–199)
Chol/HDL Ratio: 3.6 ratio (ref 0.0–5.0)
HDL: 32 mg/dL — ABNORMAL LOW (ref >39)
LDL Calculated: 47 mg/dL (ref 0–99)
Triglycerides: 176 mg/dL — ABNORMAL HIGH (ref 0–149)
VLDL Cholesterol Cal: 35 mg/dL (ref 5–40)

## 2018-08-15 NOTE — Patient Instructions (Signed)
Medication Instructions: Your physician recommends that you continue on your current medications as directed. Please refer to the Current Medication list given to you today.   Labwork: TODAY: LIPIDS  Procedures/Testing: None Ordered  Follow-Up: Your physician wants you to follow-up in: 6 months with Dr.Nahser You will receive a reminder letter in the mail two months in advance. If you don't receive a letter, please call our office to schedule the follow-up appointment.   Any Additional Special Instructions Will Be Listed Below (If Applicable).   DASH Eating Plan DASH stands for "Dietary Approaches to Stop Hypertension." The DASH eating plan is a healthy eating plan that has been shown to reduce high blood pressure (hypertension). It may also reduce your risk for type 2 diabetes, heart disease, and stroke. The DASH eating plan may also help with weight loss. What are tips for following this plan? General guidelines  Avoid eating more than 2,300 mg (milligrams) of salt (sodium) a day. If you have hypertension, you may need to reduce your sodium intake to 1,500 mg a day.  Limit alcohol intake to no more than 1 drink a day for nonpregnant women and 2 drinks a day for men. One drink equals 12 oz of beer, 5 oz of wine, or 1 oz of hard liquor.  Work with your health care provider to maintain a healthy body weight or to lose weight. Ask what an ideal weight is for you.  Get at least 30 minutes of exercise that causes your heart to beat faster (aerobic exercise) most days of the week. Activities may include walking, swimming, or biking.  Work with your health care provider or diet and nutrition specialist (dietitian) to adjust your eating plan to your individual calorie needs. Reading food labels  Check food labels for the amount of sodium per serving. Choose foods with less than 5 percent of the Daily Value of sodium. Generally, foods with less than 300 mg of sodium per serving fit into  this eating plan.  To find whole grains, look for the word "whole" as the first word in the ingredient list. Shopping  Buy products labeled as "low-sodium" or "no salt added."  Buy fresh foods. Avoid canned foods and premade or frozen meals. Cooking  Avoid adding salt when cooking. Use salt-free seasonings or herbs instead of table salt or sea salt. Check with your health care provider or pharmacist before using salt substitutes.  Do not fry foods. Cook foods using healthy methods such as baking, boiling, grilling, and broiling instead.  Cook with heart-healthy oils, such as olive, canola, soybean, or sunflower oil. Meal planning   Eat a balanced diet that includes: ? 5 or more servings of fruits and vegetables each day. At each meal, try to fill half of your plate with fruits and vegetables. ? Up to 6-8 servings of whole grains each day. ? Less than 6 oz of lean meat, poultry, or fish each day. A 3-oz serving of meat is about the same size as a deck of cards. One egg equals 1 oz. ? 2 servings of low-fat dairy each day. ? A serving of nuts, seeds, or beans 5 times each week. ? Heart-healthy fats. Healthy fats called Omega-3 fatty acids are found in foods such as flaxseeds and coldwater fish, like sardines, salmon, and mackerel.  Limit how much you eat of the following: ? Canned or prepackaged foods. ? Food that is high in trans fat, such as fried foods. ? Food that is high in saturated  fat, such as fatty meat. ? Sweets, desserts, sugary drinks, and other foods with added sugar. ? Full-fat dairy products.  Do not salt foods before eating.  Try to eat at least 2 vegetarian meals each week.  Eat more home-cooked food and less restaurant, buffet, and fast food.  When eating at a restaurant, ask that your food be prepared with less salt or no salt, if possible. What foods are recommended? The items listed may not be a complete list. Talk with your dietitian about what dietary  choices are best for you. Grains Whole-grain or whole-wheat bread. Whole-grain or whole-wheat pasta. Brown rice. [place]. Bulgur. Whole-grain and low-sodium cereals. Pita bread. Low-fat, low-sodium crackers. Whole-wheat flour tortillas. Vegetables Fresh or frozen vegetables (raw, steamed, roasted, or grilled). Low-sodium or reduced-sodium tomato and vegetable juice. Low-sodium or reduced-sodium tomato sauce and tomato paste. Low-sodium or reduced-sodium canned vegetables. Fruits All fresh, dried, or frozen fruit. Canned fruit in natural juice (without added sugar). Meat and other protein foods Skinless chicken or [place]. Ground chicken or [place]. Pork with fat trimmed off. Fish and seafood. Egg whites. Dried beans, peas, or lentils. Unsalted nuts, nut butters, and seeds. Unsalted canned beans. Lean cuts of beef with fat trimmed off. Low-sodium, lean deli meat. Dairy Low-fat (1%) or fat-free (skim) milk. Fat-free, low-fat, or reduced-fat cheeses. Nonfat, low-sodium ricotta or cottage cheese. Low-fat or nonfat yogurt. Low-fat, low-sodium cheese. Fats and oils Soft margarine without trans fats. Vegetable oil. Low-fat, reduced-fat, or light mayonnaise and salad dressings (reduced-sodium). Canola, safflower, olive, soybean, and sunflower oils. Avocado. Seasoning and other foods Herbs. Spices. Seasoning mixes without salt. Unsalted popcorn and pretzels. Fat-free sweets. What foods are not recommended? The items listed may not be a complete list. Talk with your dietitian about what dietary choices are best for you. Grains Baked goods made with fat, such as croissants, muffins, or some breads. Dry pasta or rice meal packs. Vegetables Creamed or fried vegetables. Vegetables in a cheese sauce. Regular canned vegetables (not low-sodium or reduced-sodium). Regular canned tomato sauce and paste (not low-sodium or reduced-sodium). Regular tomato and vegetable juice (not low-sodium or reduced-sodium).  Angie Fava. Olives. Fruits Canned fruit in a light or heavy syrup. Fried fruit. Fruit in cream or butter sauce. Meat and other protein foods Fatty cuts of meat. Ribs. Fried meat. Berniece Salines. Sausage. Bologna and other processed lunch meats. Salami. Fatback. Hotdogs. Bratwurst. Salted nuts and seeds. Canned beans with added salt. Canned or smoked fish. Whole eggs or egg yolks. Chicken or [place] with skin. Dairy Whole or 2% milk, cream, and half-and-half. Whole or full-fat cream cheese. Whole-fat or sweetened yogurt. Full-fat cheese. Nondairy creamers. Whipped toppings. Processed cheese and cheese spreads. Fats and oils Butter. Stick margarine. Lard. Shortening. Ghee. Bacon fat. Tropical oils, such as coconut, palm kernel, or palm oil. Seasoning and other foods Salted popcorn and pretzels. Onion salt, garlic salt, seasoned salt, table salt, and sea salt. Worcestershire sauce. Tartar sauce. Barbecue sauce. Teriyaki sauce. Soy sauce, including reduced-sodium. Steak sauce. Canned and packaged gravies. Fish sauce. Oyster sauce. Cocktail sauce. Horseradish that you find on the shelf. Ketchup. Mustard. Meat flavorings and tenderizers. Bouillon cubes. Hot sauce and Tabasco sauce. Premade or packaged marinades. Premade or packaged taco seasonings. Relishes. Regular salad dressings. Where to find more information:  National Heart, Lung, and [place]: https://wilson-eaton.com/  American Heart Association: www.heart.org Summary  The DASH eating plan is a healthy eating plan that has been shown to reduce high blood pressure (hypertension). It may also reduce  your risk for type 2 diabetes, heart disease, and stroke.  With the DASH eating plan, you should limit salt (sodium) intake to 2,300 mg a day. If you have hypertension, you may need to reduce your sodium intake to 1,500 mg a day.  When on the DASH eating plan, aim to eat more fresh fruits and vegetables, whole grains, lean proteins, low-fat dairy, and  heart-healthy fats.  Work with your health care provider or diet and nutrition specialist (dietitian) to adjust your eating plan to your individual calorie needs. This information is not intended to replace advice given to you by your health care provider. Make sure you discuss any questions you have with your health care provider. Document Released: 11/29/2011 Document Revised: 12/03/2016 Document Reviewed: 12/03/2016 Elsevier Interactive Patient Education  [employer].    If you need a refill on your cardiac medications before your next appointment, please call your pharmacy.

## 2018-08-20 ENCOUNTER — Other Ambulatory Visit: Payer: Self-pay | Admitting: Cardiovascular Disease

## 2018-09-02 ENCOUNTER — Ambulatory Visit (INDEPENDENT_AMBULATORY_CARE_PROVIDER_SITE_OTHER): Payer: PPO | Admitting: Orthopaedic Surgery

## 2018-09-02 ENCOUNTER — Encounter (INDEPENDENT_AMBULATORY_CARE_PROVIDER_SITE_OTHER): Payer: Self-pay | Admitting: Orthopaedic Surgery

## 2018-09-02 ENCOUNTER — Other Ambulatory Visit (INDEPENDENT_AMBULATORY_CARE_PROVIDER_SITE_OTHER): Payer: Self-pay

## 2018-09-02 DIAGNOSIS — M25562 Pain in left knee: Secondary | ICD-10-CM | POA: Diagnosis not present

## 2018-09-02 DIAGNOSIS — M25561 Pain in right knee: Principal | ICD-10-CM

## 2018-09-02 DIAGNOSIS — Z96653 Presence of artificial knee joint, bilateral: Secondary | ICD-10-CM | POA: Diagnosis not present

## 2018-09-02 DIAGNOSIS — G8929 Other chronic pain: Secondary | ICD-10-CM

## 2018-09-02 MED ORDER — METHYLPREDNISOLONE ACETATE 40 MG/ML IJ SUSP
40.0000 mg | INTRAMUSCULAR | Status: AC | PRN
  Administered 2018-09-02: 40 mg via INTRA_ARTICULAR

## 2018-09-02 MED ORDER — LIDOCAINE HCL 1 % IJ SOLN
3.0000 mL | INTRAMUSCULAR | Status: AC | PRN
  Administered 2018-09-02: 3 mL

## 2018-09-02 NOTE — Progress Notes (Signed)
Office Visit Note   Patient: Brad Woods           Date of Birth: 1946-08-23           MRN: [id number] Visit Date: 09/02/2018              Requested by: Joycelyn Rua, MD [address] [place], [place] [address] PCP: Joycelyn Rua, MD   Assessment & Plan: Visit Diagnoses:  1. Chronic pain of left knee   2. Chronic pain of right knee   3. History of bilateral knee replacement     Plan: At this point three-phase bone scan is warranted to rule out prosthetic loosening.  Hopefully this can show if there is his chronic cellulitis in the knee will help guide treatment plan.  I do feel it is worth trying at least a one-time steroid injection in the right knee and he is agreeable to this.  He tolerated the steroid injection well.  We will see him back in 3 weeks and hopefully he will have had a three-phase bone scan and we can make a determination of what is the next step.  I suspect we may end up offering an arthroscopic intervention of the right knee to remove any type of scar tissue that hopefully can decrease his knee symptoms.  All question concerns were answered and addressed.  I do feel that his trochanteric bursitis is related to walks dealing with his right knee.  Follow-Up Instructions: Return in about 3 weeks (around 09/23/2018).   Orders:  Orders Placed This Encounter  Procedures  . Large Joint Inj   No orders of the defined types were placed in this encounter.     Procedures: Large Joint Inj: R knee on 09/02/2018 8:53 AM Indications: diagnostic evaluation and pain Details: 22 G 1.5 in needle, superolateral approach  Arthrogram: No  Medications: 3 mL lidocaine 1 %; 40 mg methylPREDNISolone acetate 40 MG/ML Outcome: tolerated well, no immediate complications Procedure, treatment alternatives, risks and benefits explained, specific risks discussed. Consent was given by the patient. Immediately prior to procedure a time out was called to verify the correct  patient, procedure, equipment, support staff and site/side marked as required. Patient was prepped and draped in the usual sterile fashion.       Clinical Data: No additional findings.   Subjective: Chief Complaint  Patient presents with  . Left Knee - Pain  . Right Knee - Pain  The patient comes in today with continued complaints of bilateral knee pain right worse than left as well as right hip pain.  He is someone who had bilateral knee replacements done in 2014 and these were done in [facility].  Has had problems ever since then.  He is 72 years old and is moderately obese.  He has trouble going up and down steps.  He has had multiple physical therapy sessions to hopefully decrease his pain.  I saw him in 2018 and had x-rays of both knees and his pelvis and showed no evidence of arthritis in his hips and no evidence of prosthetic loosening of the knee replacements.  He says his biggest issues around the patella of both knee replacements more on the right than the left.  He is not a diabetic and not a smoker.  HPI  Review of Systems He currently denies any headache, chest pain, shortness of breath, fever, chills, nausea, vomiting.  Objective: Vital Signs: There were no vitals taken for this visit.  Physical Exam Is alert and oriented x3 and in no acute distress.  He does walk with a slight limp but no assistive devices being used. Ortho Exam Examination of his right knee shows that he does have crepitation with flexion extension as it seems to be associate around the patella.  The patella is not hypermobile but certainly there is crepitation.  There is no instability of the right knee on exam and there is no effusion.  The left knee is the same as well.  He does have chronic IT band pain and trochanteric pain.  There is no pain in the groin with full range of motion of both hips.   Specialty Comments:  No specialty comments available.  Imaging: No results  found.   PMFS History: Patient Active Problem List   Diagnosis Date Noted  . Chronic pain of left knee 09/02/2018  . Chronic pain of right knee 09/02/2018  . History of bilateral knee replacement 09/02/2018  . Allergic rhinitis 08/15/2018  . Erectile dysfunction 08/15/2018  . History of renal stone 08/15/2018  . Migraine 08/15/2018  . Osteoarthritis 08/15/2018  . Hyperlipidemia 08/15/2018  . PVD (peripheral vascular disease) (HCC) 08/15/2018  . Left shoulder pain 07/30/2018  . Chest pain 07/30/2018  . Trochanteric bursitis, left hip 05/02/2017  . Carpal tunnel syndrome, left upper limb 05/02/2017  . Trochanteric bursitis, right hip 03/18/2017  . Mixed hyperlipidemia 12/31/2016  . Coronary artery disease involving native coronary artery of native heart with unstable angina pectoris (HCC) 11/25/2016  . Coronary artery disease involving native coronary artery of native heart without angina pectoris 11/20/2016  . Elevated troponin   . Unstable angina (HCC) 05/18/2016  . Acute coronary syndrome (HCC) 05/18/2016  . Benign non-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 10/12/2014  . Essential hypertension 10/12/2014  . 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 10/12/2014  . High risk medication use 10/12/2014  . IFG (impaired fasting glucose) 10/12/2014  . OSA (obstructive sleep apnea) 02/04/2014  . Hypothyroidism 03/02/2013  . GERD (gastroesophageal reflux disease) 09/04/2012   Past Medical History:  Diagnosis Date  . Allergy   . Arthritis   . Asthma    when younger  . GERD (gastroesophageal reflux disease)   . Headache   . Hyperlipidemia   . Hypertension   . Kidney stones   . Myocardial infarction (HCC)   . Sleep apnea    cpap    Family History  Problem Relation Age of Onset  . Colon cancer Mother 71    Past Surgical History:  Procedure Laterality Date  . CARDIAC CATHETERIZATION N/A 05/20/2016   Procedure: Left Heart Cath and Coronary Angiography;  Surgeon:  Runell Gess, MD;  [facility] IN[facility] CV LAB;  Service: Cardiovascular;  Laterality: N/A;  . CARDIAC CATHETERIZATION N/A 11/22/2016   Procedure: Left Heart Cath and Coronary Angiography;  Surgeon: Yvonne Kendall, MD;  Location: [facility] INVASIVE CV LAB;  Service: Cardiovascular;  Laterality: N/A;  . COLONOSCOPY    . EYE SURGERY    . RECTAL SURGERY    . REPLACEMENT TOTAL KNEE Bilateral    Social History   Occupational History  . Not on file  Tobacco Use  . Smoking status: Never Smoker  . Smokeless tobacco: Never Used  Substance and Sexual Activity  . Alcohol use: Yes    Alcohol/week: 0.0 standard drinks    Comment: occasionally  . Drug use: No  . Sexual activity: Yes    Birth control/protection: None

## 2018-09-05 DIAGNOSIS — K219 Gastro-esophageal reflux disease without esophagitis: Secondary | ICD-10-CM | POA: Diagnosis not present

## 2018-09-05 DIAGNOSIS — Z125 Encounter for screening for malignant neoplasm of prostate: Secondary | ICD-10-CM | POA: Diagnosis not present

## 2018-09-05 DIAGNOSIS — J45909 Unspecified asthma, uncomplicated: Secondary | ICD-10-CM | POA: Diagnosis not present

## 2018-09-05 DIAGNOSIS — I10 Essential (primary) hypertension: Secondary | ICD-10-CM | POA: Diagnosis not present

## 2018-09-05 DIAGNOSIS — E7800 Pure hypercholesterolemia, unspecified: Secondary | ICD-10-CM | POA: Diagnosis not present

## 2018-09-05 DIAGNOSIS — Z23 Encounter for immunization: Secondary | ICD-10-CM | POA: Diagnosis not present

## 2018-09-05 DIAGNOSIS — Z0000 Encounter for general adult medical examination without abnormal findings: Secondary | ICD-10-CM | POA: Diagnosis not present

## 2018-09-05 DIAGNOSIS — I2510 Atherosclerotic heart disease of native coronary artery without angina pectoris: Secondary | ICD-10-CM | POA: Diagnosis not present

## 2018-09-05 DIAGNOSIS — M1990 Unspecified osteoarthritis, unspecified site: Secondary | ICD-10-CM | POA: Diagnosis not present

## 2018-09-05 DIAGNOSIS — Z131 Encounter for screening for diabetes mellitus: Secondary | ICD-10-CM | POA: Diagnosis not present

## 2018-09-05 DIAGNOSIS — Z79899 Other long term (current) drug therapy: Secondary | ICD-10-CM | POA: Diagnosis not present

## 2018-09-23 ENCOUNTER — Telehealth (INDEPENDENT_AMBULATORY_CARE_PROVIDER_SITE_OTHER): Payer: Self-pay

## 2018-09-23 ENCOUNTER — Encounter (INDEPENDENT_AMBULATORY_CARE_PROVIDER_SITE_OTHER): Payer: Self-pay | Admitting: Orthopaedic Surgery

## 2018-09-23 ENCOUNTER — Encounter (INDEPENDENT_AMBULATORY_CARE_PROVIDER_SITE_OTHER): Payer: Self-pay | Admitting: *Deleted

## 2018-09-23 ENCOUNTER — Ambulatory Visit (INDEPENDENT_AMBULATORY_CARE_PROVIDER_SITE_OTHER): Payer: PPO | Admitting: Orthopaedic Surgery

## 2018-09-23 DIAGNOSIS — G8929 Other chronic pain: Secondary | ICD-10-CM

## 2018-09-23 DIAGNOSIS — M25562 Pain in left knee: Secondary | ICD-10-CM

## 2018-09-23 DIAGNOSIS — Z96653 Presence of artificial knee joint, bilateral: Secondary | ICD-10-CM

## 2018-09-23 DIAGNOSIS — M25561 Pain in right knee: Secondary | ICD-10-CM

## 2018-09-23 NOTE — Telephone Encounter (Signed)
No one ever called this patient to schedule his 3 phase bone scan He had a followup appointment today for this

## 2018-09-23 NOTE — Telephone Encounter (Signed)
I sw Brad Woods with [facility] scheduling and she informed me she sent order over to scheduling x2 to schedule appt and that the pt can also call over to [facility] and schedule appt also.  I called pt and advised him he can contact scheduling and he wanted to get number pt was driving so I sent him message on my chart with scheduling number.

## 2018-09-23 NOTE — Progress Notes (Signed)
The patient has a history of bilateral knee replacements that were done a few years ago.  They were done elsewhere.  He still gets clicking and pain in both knees.  Is mainly his right knee.  At his last visit I provided a steroid injection in the right knee just to see if it would calm down any symptomatic synovitis in his knee.  He says that is helped quite a bit but he still experiencing some pain in his knee.  He denies any instability.  On examination of his knees both patellas seem to be slightly hypermobile.  Both knees are cool with no significant effusion in both knees feel ligamentously stable.  At this point a three-phase bone scan is already been ordered and approved and he was supposed to follow-up today to go over his three-phase bone scan.  However no one ever called actually scheduled the study once it was approved.  With that being said I would still like to obtain a three-phase bone scan prior to considering any arthroscopic intervention on his right knee.  He understands this as well.  From my standpoint this is a no charge visit since we are waiting the specialized study.  We will see him back once the bone scan is obtained.

## 2018-10-01 ENCOUNTER — Encounter (HOSPITAL_COMMUNITY)
Admission: RE | Admit: 2018-10-01 | Discharge: 2018-10-01 | Disposition: A | Discharge status: Home / Self Care | Payer: PPO | Source: Ambulatory Visit | Attending: Orthopaedic Surgery | Admitting: Orthopaedic Surgery

## 2018-10-01 ENCOUNTER — Ambulatory Visit (HOSPITAL_COMMUNITY)
Admission: RE | Admit: 2018-10-01 | Discharge: 2018-10-01 | Disposition: A | Discharge status: Home / Self Care | Payer: PPO | Source: Ambulatory Visit | Attending: Orthopaedic Surgery | Admitting: Orthopaedic Surgery

## 2018-10-01 DIAGNOSIS — G8929 Other chronic pain: Secondary | ICD-10-CM | POA: Diagnosis not present

## 2018-10-01 DIAGNOSIS — M25561 Pain in right knee: Secondary | ICD-10-CM | POA: Diagnosis not present

## 2018-10-01 DIAGNOSIS — Z96653 Presence of artificial knee joint, bilateral: Secondary | ICD-10-CM | POA: Diagnosis not present

## 2018-10-01 DIAGNOSIS — M899 Disorder of bone, unspecified: Secondary | ICD-10-CM | POA: Diagnosis not present

## 2018-10-01 MED ORDER — TECHNETIUM TC 99M MEDRONATE IV KIT
20.0000 | PACK | Freq: Once | INTRAVENOUS | Status: AC | PRN
  Administered 2018-10-01: 20 via INTRAVENOUS

## 2018-10-15 ENCOUNTER — Encounter (INDEPENDENT_AMBULATORY_CARE_PROVIDER_SITE_OTHER): Payer: Self-pay | Admitting: Orthopaedic Surgery

## 2018-10-15 ENCOUNTER — Ambulatory Visit (INDEPENDENT_AMBULATORY_CARE_PROVIDER_SITE_OTHER): Payer: PPO | Admitting: Orthopaedic Surgery

## 2018-10-15 DIAGNOSIS — Z96653 Presence of artificial knee joint, bilateral: Secondary | ICD-10-CM

## 2018-10-15 NOTE — Progress Notes (Signed)
The patient is a moderately obese 72 year old who has a history of bilateral total knee arthroplasties done at the same time by another surgeon about 7 years ago.  His biggest complaint is pain along his IT band and hip on both sides with grinding in both knees.  He says he has biggest problems going up and down stairs.  I did send in for a three-phase bone scan to rule out prosthetic loosening.  On exam there is no significant swelling of either knee there is no redness.  Both knees have good range of motion is full with both knees have grinding with the left knee on the lateral side and the right knee medial.  The right knee is once more symptomatic for him in terms of just feeling slightly unstable.  He does have some slight play in the knee with an anterior drawer sign.  At this point I would recommend an open arthrotomy with lysis of adhesions and upsizing his polyliner to give him more stability.  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  I explained in detail with this type of surgery involves.  The bone scan did not show any gross findings for loosening of the prosthesis or failure.  I do feel that he is experiencing compensatory pain as he is trying to protect his right knee going up and down stairs especially.  He is going to take all this in consideration in terms of whether or not to proceed with a polyliner exchange of his right knee.  I explained in detail what the surgery involves including discussion the risk and benefits of surgery.  If he decides to have this time he knows to give [place] a call.  All question concerns were answered and addressed.

## 2018-10-29 ENCOUNTER — Other Ambulatory Visit: Payer: Self-pay | Admitting: Cardiovascular Disease

## 2018-12-25 ENCOUNTER — Other Ambulatory Visit: Payer: Self-pay

## 2018-12-25 ENCOUNTER — Telehealth: Payer: Self-pay | Admitting: Cardiovascular Disease

## 2018-12-25 MED ORDER — EVOLOCUMAB 140 MG/ML ~~LOC~~ SOAJ: 1.0000 "pen " | SUBCUTANEOUS | 3 refills | Status: DC

## 2018-12-25 NOTE — Telephone Encounter (Signed)
Patient is calling for a refill to his Evolocumab (REPATHA SURECLICK) 102 MG/ML SOAJ.

## 2019-01-01 ENCOUNTER — Other Ambulatory Visit: Payer: Self-pay | Admitting: Cardiovascular Disease

## 2019-01-01 NOTE — Telephone Encounter (Signed)
Pt was denied for [employer]. Repatha copay is $90 per month which pt cannot afford. He was advised to call the [facility] to apply for copay assistance. He plans to apply on the phone and will let us know if he is approved.

## 2019-01-06 ENCOUNTER — Telehealth: Payer: Self-pay | Admitting: Cardiovascular Disease

## 2019-01-06 DIAGNOSIS — E782 Mixed hyperlipidemia: Secondary | ICD-10-CM

## 2019-01-06 DIAGNOSIS — I2510 Atherosclerotic heart disease of native coronary artery without angina pectoris: Secondary | ICD-10-CM

## 2019-01-06 NOTE — Telephone Encounter (Signed)
Returned call to patient who reports BP readings have been elevated over the past few days. He reports highest reading has been 170/92 mmHg; states most current reading is 160/70. He typically checks BP prior to going to bed and when he wakes up. He has an appointment with Dr. Acie Fredrickson on Thursday 1/16. I advised him to monitor BP tomorrow at least 1-2 hours after taking medication and not within 30 minutes of a meal. He asks if lab work is needed for appointment and would like to come into the office tomorrow for fasting labs. He states he has not worked on weight loss due to the recent holidays. I scheduled him for fasting labs on 1/15 and advised him to bring BP and pulse readings to appointment on Thursday. He verbalized understanding and agreement and thanked me for the call.

## 2019-01-06 NOTE — Telephone Encounter (Signed)
° ° °  Patient calling to report BP concerns   Pt c/o BP issue: STAT if pt c/o blurred vision, one-sided weakness or slurred speech  1. What are your last 5 BP readings? 165/88  2. Are you having any other symptoms (ex. Dizziness, headache, blurred vision, passed out)? lightheaded  3. What is your BP issue? Patient feels BP has been too high

## 2019-01-07 ENCOUNTER — Other Ambulatory Visit: Payer: PPO | Admitting: *Deleted

## 2019-01-07 ENCOUNTER — Encounter: Payer: Self-pay | Admitting: Cardiovascular Disease

## 2019-01-07 DIAGNOSIS — I2510 Atherosclerotic heart disease of native coronary artery without angina pectoris: Secondary | ICD-10-CM | POA: Diagnosis not present

## 2019-01-07 DIAGNOSIS — E782 Mixed hyperlipidemia: Secondary | ICD-10-CM | POA: Diagnosis not present

## 2019-01-08 ENCOUNTER — Ambulatory Visit: Payer: PPO | Admitting: Cardiovascular Disease

## 2019-01-08 ENCOUNTER — Encounter: Payer: Self-pay | Admitting: Cardiovascular Disease

## 2019-01-08 VITALS — BP 140/90 | HR 79 | Ht 74.0 in | Wt 306.0 lb

## 2019-01-08 DIAGNOSIS — I2510 Atherosclerotic heart disease of native coronary artery without angina pectoris: Secondary | ICD-10-CM | POA: Diagnosis not present

## 2019-01-08 DIAGNOSIS — I10 Essential (primary) hypertension: Secondary | ICD-10-CM

## 2019-01-08 MED ORDER — LOSARTAN POTASSIUM 50 MG PO TABS: 50.0000 mg | ORAL_TABLET | Freq: Every day | ORAL | 11 refills | Status: DC

## 2019-01-08 MED ORDER — CARVEDILOL 6.25 MG PO TABS: 6.2500 mg | ORAL_TABLET | Freq: Two times a day (BID) | ORAL | 11 refills | Status: DC

## 2019-01-08 NOTE — Patient Instructions (Signed)
Medication Instructions:  Your physician has recommended you make the following change in your medication:  START Losartan (Cozaar) 50 mg once daily INCREASE Carvedilol (Coreg) to 6.25 mg twice daily  If you need a refill on your cardiac medications before your next appointment, please call your pharmacy.   Lab work: Your physician recommends that you return for lab work in: 3 weeks for basic metabolic panel    Testing/Procedures: None Ordered    Follow-Up: At [facility], you and your health needs are our priority.  As part of our continuing mission to provide you with exceptional heart care, we have created designated Provider Care Teams.  These Care Teams include your primary Cardiologist (physician) and Advanced Practice Providers (APPs -  Physician Assistants and Nurse Practitioners) who all work together to provide you with the care you need, when you need it. You will need a follow up appointment in:  3 months.  You may see Mertie Moores, MD or one of the following Advanced Practice Providers on your designated Care Team: Richardson Dopp, PA-C [employer], [place] . Daune Perch, NP

## 2019-01-08 NOTE — Progress Notes (Signed)
Cardiology Office Note   Date:  01/08/2019   ID:  Brad Woods, DOB Nov 30, 1946, MRN [id number]  PCP:  Joycelyn Rua, MD  Cardiologist:   Kristeen Miss, MD   Problem list 1.  Coronary artery disease-status post stenting 11/17 2.  Chronic diastolic congestive heart failure  Chief Complaint  Patient presents with  . Hypertension     Brad Woods is a 73 y.o. male who presents for follow up of his CAD.   Seen with wife , Brad Woods  Has been having CP since May. Went to the [facility] , was sent to the hospital May 18, 2016 Was admitted,    At 2 AM , his CP worsened acutely .   Had a stent to his LCx.   ( 90% - 0%)   He wanted to change cardiologist and presents for further management .   Has been having CP similar to his presenting symptom in May. Seem to be worse with exertion, better with rest.   Had another episode of CP last night. Mid sternal CP, radiation down his left arm .  Took a SL NTG  - CP was relieved with NTG .   Has had some shortness of breath - possibly shortly after taking the Brilinta   Also has some belching relieves the CP    does not get any regular exercise. Before the MI, was going several times a week   [name]. 8, 2018:  Brad Woods had his heart catheterization since her last visit. He was found have moderate disease. His stents are patent. He did not require any additional stenting. He was changed from Brilinta to Plavix because of shortness of breath. He seems to be feeling a lot better after that medicine change. Wants to get back into cardiac rehab   May 13, 2017:  Brad Woods is seen for follow up of his CAD. We changed the Brilinta to plavix which helped his dyspnea Rare episodes of CP Has indigestion frequently ,  Belches seem to relieve his CP on occasion .  Exercises regularly  Wt Readings from Last 3 Encounters:  08/15/18 300 lb (136.1 kg)  07/30/18 (!) 304 lb 9.6 oz (138.2 kg)  11/11/17 (!) 309 lb (140.2 kg)   Wt is down 7 lbs since January  .   November 11, 2017:  Brad Woods is seen today for follow-up visit. Hx of CAD, hyperlipidemia,  Stopped atorvastatin 2-3 weeks ago due due to diffuse muscle aches and pains.  He is feeling much better since stopping the atorvastatin. BP looks good today  Had some atypical CP , related to indigestion / gas pains  Getting some exercise.  Lots of ortho issues.   January 08, 2019:  Brad Woods seen today for further evaluation of his hypertension. Wt today is 306 lbs  He measures his blood pressure at home.  He has been getting lots of high readings.  His blood pressure is normal here in the office today.  Does not exercise as much as he would like . Has knee issues  Sees Dr. Magnus Ivan  Needs to lose weight   No CP or angina     Past Medical History:  Diagnosis Date  . Allergy   . Arthritis   . Asthma    when younger  . GERD (gastroesophageal reflux disease)   . Headache   . Hyperlipidemia   . Hypertension   . Kidney stones   . Myocardial infarction (HCC)   . Sleep apnea  cpap    Past Surgical History:  Procedure Laterality Date  . CARDIAC CATHETERIZATION N/A 05/20/2016   Procedure: Left Heart Cath and Coronary Angiography;  Surgeon: Runell Gess, MD;  Location: [facility] INVASIVE CV LAB;  Service: Cardiovascular;  Laterality: N/A;  . CARDIAC CATHETERIZATION N/A 11/22/2016   Procedure: Left Heart Cath and Coronary Angiography;  Surgeon: Yvonne Kendall, MD;  Location: [facility] INVASIVE CV LAB;  Service: Cardiovascular;  Laterality: N/A;  . COLONOSCOPY    . EYE SURGERY    . RECTAL SURGERY    . REPLACEMENT TOTAL KNEE Bilateral      Current Outpatient Medications  Medication Sig Dispense Refill  . albuterol (PROVENTIL HFA;VENTOLIN HFA) 108 (90 Base) MCG/ACT inhaler Inhale 2 puffs into the lungs every 6 (six) hours as needed for wheezing or shortness of breath. 1 Inhaler 2  . aspirin EC 81 MG EC tablet Take 1 tablet (81 mg total) by mouth daily.    . carvedilol (COREG) 3.125 MG tablet  TAKE 1 TABLET BY MOUTH TWICE A DAY 180 tablet 2  . clopidogrel (PLAVIX) 75 MG tablet Take 1 tablet (75 mg total) by mouth daily. 90 tablet 2  . Evolocumab (REPATHA SURECLICK) 140 MG/ML SOAJ Inject 1 pen into the skin every 14 (fourteen) days. 2 pen 11  . finasteride (PROSCAR) 5 MG tablet Take 5 mg by mouth daily.    . fluticasone (FLONASE) 50 MCG/ACT nasal spray Place 2 sprays into the nose daily as needed for allergies.     . isosorbide mononitrate (IMDUR) 60 MG 24 hr tablet TAKE 1 TABLET BY MOUTH EVERY DAY 90 tablet 3  . Magnesium Oxide (PHILLIPS) 500 MG (LAX) TABS Take 1-3 tablets by mouth every 4 (four) hours as needed (Heart burn).     . Multiple Vitamins-Minerals (CENTRUM SILVER 50+MEN PO) Take 1 tablet by mouth every other day.     . nitroGLYCERIN (NITROSTAT) 0.4 MG SL tablet PLACE 1 TABLET (0.4 MG TOTAL) UNDER THE TONGUE EVERY 5 (FIVE) MINUTES AS NEEDED FOR CHEST PAIN. 25 tablet 3  . tamsulosin (FLOMAX) 0.4 MG CAPS capsule Take 1 capsule by mouth daily.     No current facility-administered medications for this visit.     Allergies:   Hydromorphone hcl and Aleve [naproxen sodium]    Social History:  The patient  reports that he has never smoked. He has never used smokeless tobacco. He reports current alcohol use. He reports that he does not use drugs.   Family History:  The patient's family history includes Colon cancer (age of onset: 16) in his mother.    ROS:  Please see the history of present illness.   Noted in the current history.  All other systems are negative.Brad Woods    Physical Exam: There were no vitals taken for this visit.  GEN: Morbidly obese middle-aged gentleman, no acute distress HEENT: Normal NECK: No JVD; No carotid bruits LYMPHATICS: No lymphadenopathy CARDIAC: RRR   RESPIRATORY:  Clear to auscultation without rales, wheezing or rhonchi  ABDOMEN: Soft, non-tender, non-distended MUSCULOSKELETAL:  No edema; No deformity  SKIN: Warm and dry NEUROLOGIC:  Alert  and oriented x 3   EKG:    Recent Labs: 07/29/2018: Hemoglobin 16.5; Platelets 169 01/07/2019: ALT 19; BUN 18; Creatinine, Ser 1.02; Potassium 4.0; Sodium 139    Lipid Panel    Component Value Date/Time   CHOL 114 01/07/2019 0806   TRIG 159 (H) 01/07/2019 0806   HDL 34 (L) 01/07/2019 0806   CHOLHDL 3.4 01/07/2019  0806   CHOLHDL 4.0 11/20/2016 1643   VLDL 31 (H) 11/20/2016 1643   LDLCALC 48 01/07/2019 0806      Wt Readings from Last 3 Encounters:  08/15/18 300 lb (136.1 kg)  07/30/18 (!) 304 lb 9.6 oz (138.2 kg)  11/11/17 (!) 309 lb (140.2 kg)      Other studies Reviewed: Additional studies/ records that were reviewed today include: . Review of the above records demonstrates:    ASSESSMENT AND PLAN:  1.  CAD :    No angina      2. Hyperlipidemia:   He is currently on Repatha.  His lipid panel looks great.  Total cholesterol is 114.  HDL is 34.  LDL is 48.  Triglycerides are 159.  3. Morbid obesity :    Encouraged weight loss.  4.  Hypertension: His blood pressure remains mildly elevated Increase coreg to 6.25 bid Add losartan 50 mg a day  Follow up with APP in 3 months  I suspect that he will improve  dramatically with weight loss.  Current medicines are reviewed at length with the patient today.  The patient does not have concerns regarding medicines.  Labs/ tests ordered today include:  No orders of the defined types were placed in this encounter.    Disposition:   FU with  An APP in 3 months     Kristeen Miss, MD  01/08/2019 8:56 AM    [facility] [address] [place], [place], [place]  [address] Phone: [phone]; Fax: [phone]

## 2019-01-09 LAB — BASIC METABOLIC PANEL
BUN/Creatinine Ratio: 18 (ref 10–24)
BUN: 18 mg/dL (ref 8–27)
CO2: 24 mmol/L (ref 20–29)
Calcium: 9.7 mg/dL (ref 8.6–10.2)
Chloride: 99 mmol/L (ref 96–106)
Creatinine, Ser: 1.02 mg/dL (ref 0.76–1.27)
GFR calc Af Amer: 85 mL/min/{1.73_m2} (ref >59)
GFR calc non Af Amer: 74 mL/min/{1.73_m2} (ref >59)
Glucose: 98 mg/dL (ref 65–99)
Potassium: 4 mmol/L (ref 3.5–5.2)
Sodium: 139 mmol/L (ref 134–144)

## 2019-01-09 LAB — LIPID PANEL
CHOLESTEROL TOTAL: 114 mg/dL (ref 100–199)
Chol/HDL Ratio: 3.4 ratio (ref 0.0–5.0)
HDL: 34 mg/dL — ABNORMAL LOW (ref >39)
LDL Calculated: 48 mg/dL (ref 0–99)
Triglycerides: 159 mg/dL — ABNORMAL HIGH (ref 0–149)
VLDL Cholesterol Cal: 32 mg/dL (ref 5–40)

## 2019-01-09 LAB — HEPATIC FUNCTION PANEL
ALBUMIN: 4.5 g/dL (ref 3.5–4.8)
ALT: 19 IU/L (ref 0–44)
AST: 16 IU/L (ref 0–40)
Alkaline Phosphatase: 89 IU/L (ref 39–117)
Bilirubin Total: 0.7 mg/dL (ref 0.0–1.2)
Bilirubin, Direct: 0.21 mg/dL (ref 0.00–0.40)
Total Protein: 6.9 g/dL (ref 6.0–8.5)

## 2019-01-29 ENCOUNTER — Other Ambulatory Visit: Payer: PPO

## 2019-01-29 DIAGNOSIS — I2510 Atherosclerotic heart disease of native coronary artery without angina pectoris: Secondary | ICD-10-CM | POA: Diagnosis not present

## 2019-01-29 DIAGNOSIS — I10 Essential (primary) hypertension: Secondary | ICD-10-CM

## 2019-01-29 LAB — BASIC METABOLIC PANEL
BUN/Creatinine Ratio: 22 (ref 10–24)
BUN: 19 mg/dL (ref 8–27)
CHLORIDE: 101 mmol/L (ref 96–106)
CO2: 21 mmol/L (ref 20–29)
Calcium: 9.1 mg/dL (ref 8.6–10.2)
Creatinine, Ser: 0.85 mg/dL (ref 0.76–1.27)
GFR calc Af Amer: 101 mL/min/{1.73_m2} (ref >59)
GFR calc non Af Amer: 88 mL/min/{1.73_m2} (ref >59)
GLUCOSE: 103 mg/dL — AB (ref 65–99)
Potassium: 4.1 mmol/L (ref 3.5–5.2)
Sodium: 138 mmol/L (ref 134–144)

## 2019-03-02 DIAGNOSIS — N400 Enlarged prostate without lower urinary tract symptoms: Secondary | ICD-10-CM | POA: Diagnosis not present

## 2019-03-02 DIAGNOSIS — I10 Essential (primary) hypertension: Secondary | ICD-10-CM | POA: Diagnosis not present

## 2019-03-02 DIAGNOSIS — I2510 Atherosclerotic heart disease of native coronary artery without angina pectoris: Secondary | ICD-10-CM | POA: Diagnosis not present

## 2019-03-02 DIAGNOSIS — E7800 Pure hypercholesterolemia, unspecified: Secondary | ICD-10-CM | POA: Diagnosis not present

## 2019-03-02 DIAGNOSIS — M25511 Pain in right shoulder: Secondary | ICD-10-CM | POA: Diagnosis not present

## 2019-03-02 DIAGNOSIS — G5602 Carpal tunnel syndrome, left upper limb: Secondary | ICD-10-CM | POA: Diagnosis not present

## 2019-03-02 DIAGNOSIS — J45909 Unspecified asthma, uncomplicated: Secondary | ICD-10-CM | POA: Diagnosis not present

## 2019-03-23 ENCOUNTER — Other Ambulatory Visit: Payer: Self-pay | Admitting: Cardiovascular Disease

## 2019-03-26 ENCOUNTER — Telehealth: Payer: Self-pay

## 2019-03-26 NOTE — Telephone Encounter (Signed)
   Primary Cardiologist:  Mertie Moores, MD   Patient contacted.  History reviewed.  No symptoms to suggest any unstable cardiac conditions.  Based on discussion, with current pandemic situation, we will be postponing this appointment for Brad Woods with a plan for f/u in 6-12 wks or sooner if feasible/necessary.  If symptoms change, he has been instructed to contact our office.   Routing to C19 CANCEL pool for tracking (P CV DIV CV19 CANCEL - reason for visit "other.") and assigning priority (1 = 4-6 wks, 2 = 6-12 wks, 3 = >12 wks).   Jones Broom, CMA  03/26/2019 4:36 PM         .

## 2019-04-10 ENCOUNTER — Telehealth: Payer: Self-pay | Admitting: Cardiovascular Disease

## 2019-04-10 ENCOUNTER — Ambulatory Visit: Payer: PPO | Admitting: Cardiovascular Disease

## 2019-04-10 NOTE — Telephone Encounter (Signed)
Spoke with patient who declined virtual visit with Kathyrn Drown.  He decided to call us back to reschedule his April appointment to see Dr. Acie Fredrickson in August.

## 2019-07-01 ENCOUNTER — Other Ambulatory Visit: Payer: Self-pay | Admitting: Cardiovascular Disease

## 2019-07-21 ENCOUNTER — Other Ambulatory Visit: Payer: Self-pay | Admitting: Cardiovascular Disease

## 2019-08-12 NOTE — Progress Notes (Signed)
Virtual Visit via Video Note   This visit type was conducted due to national recommendations for restrictions regarding the COVID-19 Pandemic (e.g. social distancing) in an effort to limit this patient's exposure and mitigate transmission in our community.  Due to his co-morbid illnesses, this patient is at least at moderate risk for complications without adequate follow up.  This format is felt to be most appropriate for this patient at this time.  All issues noted in this document were discussed and addressed.  A limited physical exam was performed with this format.  Please refer to the patient's chart for his consent to telehealth for [facility].   Date:  08/12/2019   ID:  Brad Woods, DOB 1946-10-08, MRN [id number]  Patient Location: Home Provider Location: Home PCP:  Joycelyn Rua, MD  Cardiologist:  Kristeen Miss, MD  Electrophysiologist:  None   Problem list 1.  Coronary artery disease-status post stenting 11/17 2.  Chronic diastolic congestive heart failure     Chief Complaint  Patient presents with  . Hypertension     Brad Woods is a 73 y.o. male who presents for follow up of his CAD.   Seen with wife , Brad Woods  Has been having CP since May. Went to the [facility] , was sent to the hospital May 18, 2016 Was admitted,    At 2 AM , his CP worsened acutely .   Had a stent to his LCx.   ( 90% - 0%)   He wanted to change cardiologist and presents for further management .   Has been having CP similar to his presenting symptom in May. Seem to be worse with exertion, better with rest.   Had another episode of CP last night. Mid sternal CP, radiation down his left arm .  Took a SL NTG  - CP was relieved with NTG .   Has had some shortness of breath - possibly shortly after taking the Brilinta   Also has some belching relieves the CP    does not get any regular exercise. Before the MI, was going several times a week   [name]. 8, 2018:  Brad Woods had his  heart catheterization since her last visit. He was found have moderate disease. His stents are patent. He did not require any additional stenting. He was changed from Brilinta to Plavix because of shortness of breath. He seems to be feeling a lot better after that medicine change. Wants to get back into cardiac rehab   May 13, 2017:  Brad Woods is seen for follow up of his CAD. We changed the Brilinta to plavix which helped his dyspnea Rare episodes of CP Has indigestion frequently ,  Belches seem to relieve his CP on occasion .  Exercises regularly     Wt Readings from Last 3 Encounters:  08/15/18 300 lb (136.1 kg)  07/30/18 (!) 304 lb 9.6 oz (138.2 kg)  11/11/17 (!) 309 lb (140.2 kg)   Wt is down 7 lbs since January .   November 11, 2017:  Brad Woods is seen today for follow-up visit. Hx of CAD, hyperlipidemia,  Stopped atorvastatin 2-3 weeks ago due due to diffuse muscle aches and pains.  He is feeling much better since stopping the atorvastatin. BP looks good today  Had some atypical CP , related to indigestion / gas pains  Getting some exercise.  Lots of ortho issues.   January 08, 2019:  Brad Woods seen today for further evaluation of his hypertension. Wt today is  306 lbs  He measures his blood pressure at home.  He has been getting lots of high readings.  His blood pressure is normal here in the office today.  Does not exercise as much as he would like . Has knee issues  Sees Dr. Magnus Ivan  Needs to lose weight   No CP or angina    Evaluation Performed:  Follow-Up Visit  Chief Complaint:  CAD, hyperlipidemia  Aug. 20, 2020    Brad Woods is a 73 y.o. male with CAD , hyperlipidemia, HTN Hx of obesity .  No cp or dyspnea.  No syncope.  No palpitations Has some asthma on occasion  BP has been a little elevated.  Unable to exercise much due to bad hips and knees.     The patient does not have symptoms concerning for COVID-19 infection (fever, chills, cough, or  new shortness of breath).    Past Medical History:  Diagnosis Date  . Allergy   . Arthritis   . Asthma    when younger  . GERD (gastroesophageal reflux disease)   . Headache   . Hyperlipidemia   . Hypertension   . Kidney stones   . Myocardial infarction (HCC)   . Sleep apnea    cpap   Past Surgical History:  Procedure Laterality Date  . CARDIAC CATHETERIZATION N/A 05/20/2016   Procedure: Left Heart Cath and Coronary Angiography;  Surgeon: Runell Gess, MD;  Location: [facility] INVASIVE CV LAB;  Service: Cardiovascular;  Laterality: N/A;  . CARDIAC CATHETERIZATION N/A 11/22/2016   Procedure: Left Heart Cath and Coronary Angiography;  Surgeon: Yvonne Kendall, MD;  Location: [facility] INVASIVE CV LAB;  Service: Cardiovascular;  Laterality: N/A;  . COLONOSCOPY    . EYE SURGERY    . RECTAL SURGERY    . REPLACEMENT TOTAL KNEE Bilateral      Current Meds  Medication Sig  . albuterol (PROVENTIL HFA;VENTOLIN HFA) 108 (90 Base) MCG/ACT inhaler Inhale 2 puffs into the lungs every 6 (six) hours as needed for wheezing or shortness of breath.  Marland Kitchen aspirin EC 81 MG EC tablet Take 1 tablet (81 mg total) by mouth daily.  . carvedilol (COREG) 6.25 MG tablet Take 1 tablet (6.25 mg total) by mouth 2 (two) times daily.  . clopidogrel (PLAVIX) 75 MG tablet TAKE 1 TABLET BY MOUTH EVERY DAY  . finasteride (PROSCAR) 5 MG tablet Take 5 mg by mouth daily.  . fluticasone (FLONASE) 50 MCG/ACT nasal spray Place 2 sprays into the nose daily as needed for allergies.   . isosorbide mononitrate (IMDUR) 60 MG 24 hr tablet TAKE 1 TABLET BY MOUTH EVERY DAY  . losartan (COZAAR) 25 MG tablet TAKE 2 TABLET BY MOUTH EVERY DAY  . Magnesium Oxide (PHILLIPS) 500 MG (LAX) TABS Take 1-3 tablets by mouth every 4 (four) hours as needed (Heart burn).   . Multiple Vitamins-Minerals (CENTRUM SILVER 50+MEN PO) Take 1 tablet by mouth every other day.   . nitroGLYCERIN (NITROSTAT) 0.4 MG SL tablet PLACE 1 TABLET (0.4 MG TOTAL) UNDER THE  TONGUE EVERY 5 (FIVE) MINUTES AS NEEDED FOR CHEST PAIN.  Marland Kitchen REPATHA SURECLICK 140 MG/ML SOAJ INJECT 1 PEN INTO THE SKIN EVERY 14 (FOURTEEN) DAYS.  Marland Kitchen tamsulosin (FLOMAX) 0.4 MG CAPS capsule Take 1 capsule by mouth daily.     Allergies:   Hydromorphone hcl, Hydromorphone, and Aleve [naproxen sodium]   Social History   Tobacco Use  . Smoking status: Never Smoker  . Smokeless tobacco: Never Used  Substance Use Topics  . Alcohol use: Yes    Alcohol/week: 0.0 standard drinks    Comment: occasionally  . Drug use: No     Family Hx: The patient's family history includes Colon cancer (age of onset: 66) in his mother.  ROS:   Please see the history of present illness.     All other systems reviewed and are negative.   Prior CV studies:   The following studies were reviewed today:    Labs/Other Tests and Data Reviewed:    EKG:  No ECG reviewed.  Recent Labs: 01/07/2019: ALT 19 01/29/2019: BUN 19; Creatinine, Ser 0.85; Potassium 4.1; Sodium 138   Recent Lipid Panel Lab Results  Component Value Date/Time   CHOL 114 01/07/2019 08:06 AM   TRIG 159 (H) 01/07/2019 08:06 AM   HDL 34 (L) 01/07/2019 08:06 AM   CHOLHDL 3.4 01/07/2019 08:06 AM   CHOLHDL 4.0 11/20/2016 04:43 PM   LDLCALC 48 01/07/2019 08:06 AM    Wt Readings from Last 3 Encounters:  01/08/19 (!) 306 lb (138.8 kg)  08/15/18 300 lb (136.1 kg)  07/30/18 (!) 304 lb 9.6 oz (138.2 kg)     Objective:    Vital Signs:  There were no vitals taken for this visit.   VITAL SIGNS:  reviewed GEN:  no acute distress EYES:  sclerae anicteric, EOMI - Extraocular Movements Intact RESPIRATORY:  normal respiratory effort, symmetric expansion CARDIOVASCULAR:  no peripheral edema SKIN:  no rash, lesions or ulcers. MUSCULOSKELETAL:  no obvious deformities. NEURO:  alert and oriented x 3, no obvious focal deficit PSYCH:  normal affect  ASSESSMENT & PLAN:    1. CAD :  Overall doing well.  No angina.  Breathing seems to be ok.   Encouraged weight loss .  Will have him see an APP in 6 months with labs.    COVID-19 Education: The signs and symptoms of COVID-19 were discussed with the patient and how to seek care for testing (follow up with PCP or arrange E-visit).  The importance of social distancing was discussed today.  Time:   Today, I have spent  18  minutes with the patient with telehealth technology discussing the above problems.     Medication Adjustments/Labs and Tests Ordered: Current medicines are reviewed at length with the patient today.  Concerns regarding medicines are outlined above.   Tests Ordered: No orders of the defined types were placed in this encounter.   Medication Changes: No orders of the defined types were placed in this encounter.   Follow Up:  In Person in 6 month(s)  Signed, Kristeen Miss, MD  08/12/2019 7:57 PM    [facility]

## 2019-08-13 ENCOUNTER — Telehealth (INDEPENDENT_AMBULATORY_CARE_PROVIDER_SITE_OTHER): Payer: PPO | Admitting: Cardiovascular Disease

## 2019-08-13 ENCOUNTER — Other Ambulatory Visit: Payer: Self-pay

## 2019-08-13 VITALS — BP 134/76 | HR 80 | Ht 74.0 in | Wt 296.0 lb

## 2019-08-13 DIAGNOSIS — I2510 Atherosclerotic heart disease of native coronary artery without angina pectoris: Secondary | ICD-10-CM | POA: Diagnosis not present

## 2019-08-13 DIAGNOSIS — I200 Unstable angina: Secondary | ICD-10-CM | POA: Diagnosis not present

## 2019-08-13 DIAGNOSIS — E782 Mixed hyperlipidemia: Secondary | ICD-10-CM | POA: Diagnosis not present

## 2019-08-13 DIAGNOSIS — Z7189 Other specified counseling: Secondary | ICD-10-CM | POA: Diagnosis not present

## 2019-08-13 NOTE — Patient Instructions (Signed)
Medication Instructions:  Your physician recommends that you continue on your current medications as directed. Please refer to the Current Medication list given to you today.  If you need a refill on your cardiac medications before your next appointment, please call your pharmacy.    Lab work: Your physician recommends that you return for lab work in: 6 months on the day of or a few days before your office visit  You will need to FAST for this appointment - nothing to eat or drink after midnight the night before except water.     Testing/Procedures: None Ordered   Follow-Up: At [facility], you and your health needs are our priority.  As part of our continuing mission to provide you with exceptional heart care, we have created designated Provider Care Teams.  These Care Teams include your primary Cardiologist (physician) and Advanced Practice Providers (APPs -  Physician Assistants and Nurse Practitioners) who all work together to provide you with the care you need, when you need it. You will need a follow up appointment in:  6 months.  Please call our office 2 months in advance to schedule this appointment. Dr. Mertie Moores, MD has advised you see one of the following Advanced Practice Providers on your designated Care Team: Richardson Dopp, PA-C [place], [place] . Daune Perch, NP

## 2019-08-19 ENCOUNTER — Other Ambulatory Visit: Payer: Self-pay | Admitting: Cardiovascular Disease

## 2019-09-02 DIAGNOSIS — E7800 Pure hypercholesterolemia, unspecified: Secondary | ICD-10-CM | POA: Diagnosis not present

## 2019-09-02 DIAGNOSIS — J309 Allergic rhinitis, unspecified: Secondary | ICD-10-CM | POA: Diagnosis not present

## 2019-09-02 DIAGNOSIS — N400 Enlarged prostate without lower urinary tract symptoms: Secondary | ICD-10-CM | POA: Diagnosis not present

## 2019-09-02 DIAGNOSIS — Z0000 Encounter for general adult medical examination without abnormal findings: Secondary | ICD-10-CM | POA: Diagnosis not present

## 2019-09-02 DIAGNOSIS — J45909 Unspecified asthma, uncomplicated: Secondary | ICD-10-CM | POA: Diagnosis not present

## 2019-09-02 DIAGNOSIS — I10 Essential (primary) hypertension: Secondary | ICD-10-CM | POA: Diagnosis not present

## 2019-09-02 DIAGNOSIS — Z125 Encounter for screening for malignant neoplasm of prostate: Secondary | ICD-10-CM | POA: Diagnosis not present

## 2019-09-02 DIAGNOSIS — I2510 Atherosclerotic heart disease of native coronary artery without angina pectoris: Secondary | ICD-10-CM | POA: Diagnosis not present

## 2019-09-15 DIAGNOSIS — Z125 Encounter for screening for malignant neoplasm of prostate: Secondary | ICD-10-CM | POA: Diagnosis not present

## 2019-09-15 DIAGNOSIS — I10 Essential (primary) hypertension: Secondary | ICD-10-CM | POA: Diagnosis not present

## 2019-09-15 DIAGNOSIS — J45909 Unspecified asthma, uncomplicated: Secondary | ICD-10-CM | POA: Diagnosis not present

## 2019-09-15 DIAGNOSIS — Z0000 Encounter for general adult medical examination without abnormal findings: Secondary | ICD-10-CM | POA: Diagnosis not present

## 2019-09-15 DIAGNOSIS — I2510 Atherosclerotic heart disease of native coronary artery without angina pectoris: Secondary | ICD-10-CM | POA: Diagnosis not present

## 2019-09-15 DIAGNOSIS — N400 Enlarged prostate without lower urinary tract symptoms: Secondary | ICD-10-CM | POA: Diagnosis not present

## 2019-09-15 DIAGNOSIS — E7800 Pure hypercholesterolemia, unspecified: Secondary | ICD-10-CM | POA: Diagnosis not present

## 2019-09-29 DIAGNOSIS — Z23 Encounter for immunization: Secondary | ICD-10-CM | POA: Diagnosis not present

## 2019-11-10 ENCOUNTER — Other Ambulatory Visit: Payer: Self-pay

## 2019-11-10 ENCOUNTER — Encounter: Payer: Self-pay | Admitting: Orthopaedic Surgery

## 2019-11-10 ENCOUNTER — Ambulatory Visit: Payer: Self-pay

## 2019-11-10 ENCOUNTER — Ambulatory Visit (INDEPENDENT_AMBULATORY_CARE_PROVIDER_SITE_OTHER): Payer: PPO | Admitting: Orthopaedic Surgery

## 2019-11-10 DIAGNOSIS — M7541 Impingement syndrome of right shoulder: Secondary | ICD-10-CM | POA: Diagnosis not present

## 2019-11-10 DIAGNOSIS — M7061 Trochanteric bursitis, right hip: Secondary | ICD-10-CM | POA: Diagnosis not present

## 2019-11-10 DIAGNOSIS — M25551 Pain in right hip: Secondary | ICD-10-CM

## 2019-11-10 DIAGNOSIS — M25532 Pain in left wrist: Secondary | ICD-10-CM

## 2019-11-10 DIAGNOSIS — G5602 Carpal tunnel syndrome, left upper limb: Secondary | ICD-10-CM

## 2019-11-10 MED ORDER — METHYLPREDNISOLONE ACETATE 40 MG/ML IJ SUSP
40.0000 mg | INTRAMUSCULAR | Status: AC | PRN
  Administered 2019-11-10: 10:00:00 40 mg via INTRA_ARTICULAR

## 2019-11-10 MED ORDER — LIDOCAINE HCL 1 % IJ SOLN
3.0000 mL | INTRAMUSCULAR | Status: AC | PRN
  Administered 2019-11-10: 3 mL

## 2019-11-10 NOTE — Progress Notes (Signed)
Office Visit Note   Patient: Brad Woods           Date of Birth: 12-10-1946           MRN: [id number] Visit Date: 11/10/2019              Requested by: Joycelyn Rua, MD [address] [place],  [place] [address] PCP: Joycelyn Rua, MD   Assessment & Plan: Visit Diagnoses:  1. Pain in left wrist   2. Pain in right hip   3. Carpal tunnel syndrome, left upper limb   4. Impingement syndrome of right shoulder   5. Trochanteric bursitis, right hip     Plan: We had a long and thorough discussion about carpal tunnel surgery and what to expect intraoperative and postoperative.  The risk and benefits of surgery discussed in detail.  We talked about the anatomy of the transverse carpal ligament.  We will work on getting surgery scheduled as an outpatient which can be done under conscious sedation and local anesthetic.  Per his wishes I did provide a steroid injection in his right shoulder and right hip today and counseled him about the risk and benefits of injections.  All question concerns were answered and addressed.  We will see him back postoperatively in 2 weeks after his left carpal tunnel surgery to remove sutures.  Follow-Up Instructions: Return for 2 weeks post-op.   Orders:  Orders Placed This Encounter  Procedures   Large Joint Inj   Large Joint Inj   No orders of the defined types were placed in this encounter.     Procedures: Large Joint Inj: R greater trochanter on 11/10/2019 9:47 AM Indications: pain and diagnostic evaluation Details: 22 G 1.5 in needle, lateral approach  Arthrogram: No  Medications: 3 mL lidocaine 1 %; 40 mg methylPREDNISolone acetate 40 MG/ML Outcome: tolerated well, no immediate complications Procedure, treatment alternatives, risks and benefits explained, specific risks discussed. Consent was given by the patient. Immediately prior to procedure a time out was called to verify the correct patient, procedure, equipment, support  staff and site/side marked as required. Patient was prepped and draped in the usual sterile fashion.   Large Joint Inj: R subacromial bursa on 11/10/2019 9:48 AM Indications: pain and diagnostic evaluation Details: 22 G 1.5 in needle  Arthrogram: No  Medications: 3 mL lidocaine 1 %; 40 mg methylPREDNISolone acetate 40 MG/ML Outcome: tolerated well, no immediate complications Procedure, treatment alternatives, risks and benefits explained, specific risks discussed. Consent was given by the patient. Immediately prior to procedure a time out was called to verify the correct patient, procedure, equipment, support staff and site/side marked as required. Patient was prepped and draped in the usual sterile fashion.       Clinical Data: No additional findings.   Subjective: Chief Complaint  Patient presents with   Left Wrist - Pain   Right Hip - Pain  The patient comes in today with multiple issues.  His most pressing is severe carpal tunnel syndrome involving the left upper extremity.  He has been diagnosed with this in the past with nerve conduction studies.  He does have remote history of a right carpal tunnel release done by someone else in town who is since retired.  I have replaced his knees before.  He has been dealing with right hip pain and right shoulder pain as well is low back pain.  He is 73 years old.  He is significantly obese and his  mobility is limited.  He reports good blood glucose control.  He would like to schedule carpal tunnel surgery at this standpoint on his left upper extremity.  He would like to have steroid injections right shoulder and right hip today and I agree with this as well.  His hand has been getting weak.  He has numbness and tingling throughout the day and it does wake him up significantly at night as well.  HPI  Review of Systems She currently denies any headache, chest pain, shortness of breath, fever, chills, nausea, vomiting  Objective: Vital  Signs: There were no vitals taken for this visit.  Physical Exam She is alert and orient x3 and in no acute distress Ortho Exam Examination of his right hand does show a previous healed incision from carpal tunnel surgery.  Examination of his left hand she has numbness in the median nerve distribution.  There is slight atrophy of the muscles of the hand.  There is significant decreased grip strength on his left side.  He has a positive Phalen's and Tinel's exam.  Examination of his right hip shows no pain in the groin with full internal X rotation.  His pain is palpation of the trochanteric area and the IT band.  Examination of his right shoulder shows crepitation and evidence of impingement with positive Neer and Hawkins signs.  There is weakness the rotator cuff as well. Specialty Comments:  No specialty comments available.  Imaging: No results found.   PMFS History: Patient Active Problem List   Diagnosis Date Noted   Chronic pain of left knee 09/02/2018   Chronic pain of right knee 09/02/2018   History of bilateral knee replacement 09/02/2018   Allergic rhinitis 08/15/2018   Erectile dysfunction 08/15/2018   History of renal stone 08/15/2018   Migraine 08/15/2018   Osteoarthritis 08/15/2018   Hyperlipidemia 08/15/2018   PVD (peripheral vascular disease) (HCC) 08/15/2018   Left shoulder pain 07/30/2018   Chest pain 07/30/2018   Trochanteric bursitis, left hip 05/02/2017   Carpal tunnel syndrome, left upper limb 05/02/2017   Trochanteric bursitis, right hip 03/18/2017   Mixed hyperlipidemia 12/31/2016   Coronary artery disease involving native coronary artery of native heart with unstable angina pectoris (HCC) 11/25/2016   Coronary artery disease involving native coronary artery of native heart without angina pectoris 11/20/2016   Elevated troponin    Unstable angina (HCC) 05/18/2016   Acute coronary syndrome (HCC) 05/18/2016   Benign non-nodular prostatic  hyperplasia with lower urinary tract symptoms 10/12/2014   Essential hypertension 10/12/2014   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 10/12/2014   High risk medication use 10/12/2014   IFG (impaired fasting glucose) 10/12/2014   OSA (obstructive sleep apnea) 02/04/2014   Hypothyroidism 03/02/2013   GERD (gastroesophageal reflux disease) 09/04/2012   Past Medical History:  Diagnosis Date   Allergy    Arthritis    Asthma    when younger   GERD (gastroesophageal reflux disease)    Headache    Hyperlipidemia    Hypertension    Kidney stones    Myocardial infarction [facility])    Sleep apnea    cpap    Family History  Problem Relation Age of Onset   Colon cancer Mother 34    Past Surgical History:  Procedure Laterality Date   CARDIAC CATHETERIZATION N/A 05/20/2016   Procedure: Left Heart Cath and Coronary Angiography;  Surgeon: Runell Gess, MD;  Location: [facility] INVASIVE CV LAB;  Service: Cardiovascular;  Laterality: N/A;   CARDIAC CATHETERIZATION  N/A 11/22/2016   Procedure: Left Heart Cath and Coronary Angiography;  Surgeon: Yvonne Kendall, MD;  Location: [facility] INVASIVE CV LAB;  Service: Cardiovascular;  Laterality: N/A;   COLONOSCOPY     EYE SURGERY     RECTAL SURGERY     REPLACEMENT TOTAL KNEE Bilateral    Social History   Occupational History   Not on file  Tobacco Use   Smoking status: Never Smoker   Smokeless tobacco: Never Used  Substance and Sexual Activity   Alcohol use: Yes    Alcohol/week: 0.0 standard drinks    Comment: occasionally   Drug use: No   Sexual activity: Yes    Birth control/protection: None

## 2019-11-16 ENCOUNTER — Telehealth: Payer: Self-pay | Admitting: Orthopaedic Surgery

## 2019-11-16 NOTE — Telephone Encounter (Signed)
Patient spouse called and stated that her patient wanted to cancel surgery on 11/26/19.  Patient wants someone to call him to confirm it was cancelled  [phone]

## 2019-11-17 NOTE — Telephone Encounter (Signed)
I called patient and confirmed canceled.  He will call back to reschedule after the holidays.

## 2019-12-10 ENCOUNTER — Inpatient Hospital Stay: Payer: PPO | Admitting: Orthopaedic Surgery

## 2020-01-06 ENCOUNTER — Telehealth: Payer: Self-pay

## 2020-01-06 NOTE — Telephone Encounter (Signed)
Called and lmomed the pt asking them to notify the chst location if they have been approved for the [employer]

## 2020-01-07 NOTE — Telephone Encounter (Signed)
Pt returned call - said he applied for [facility] assistance and was approved, [employer] copay is $0/month.

## 2020-01-29 IMAGING — DX DG CHEST 1V PORT
1 series · 2 of 2 positions shown · non-contrast
Comparison: 08/01/2016

CLINICAL DATA: Chest pain

EXAM:
PORTABLE CHEST 1 VIEW

[Series 1: chest ap · 0.14mm/px · 2 of 2 slices shown]
[im 1/2]
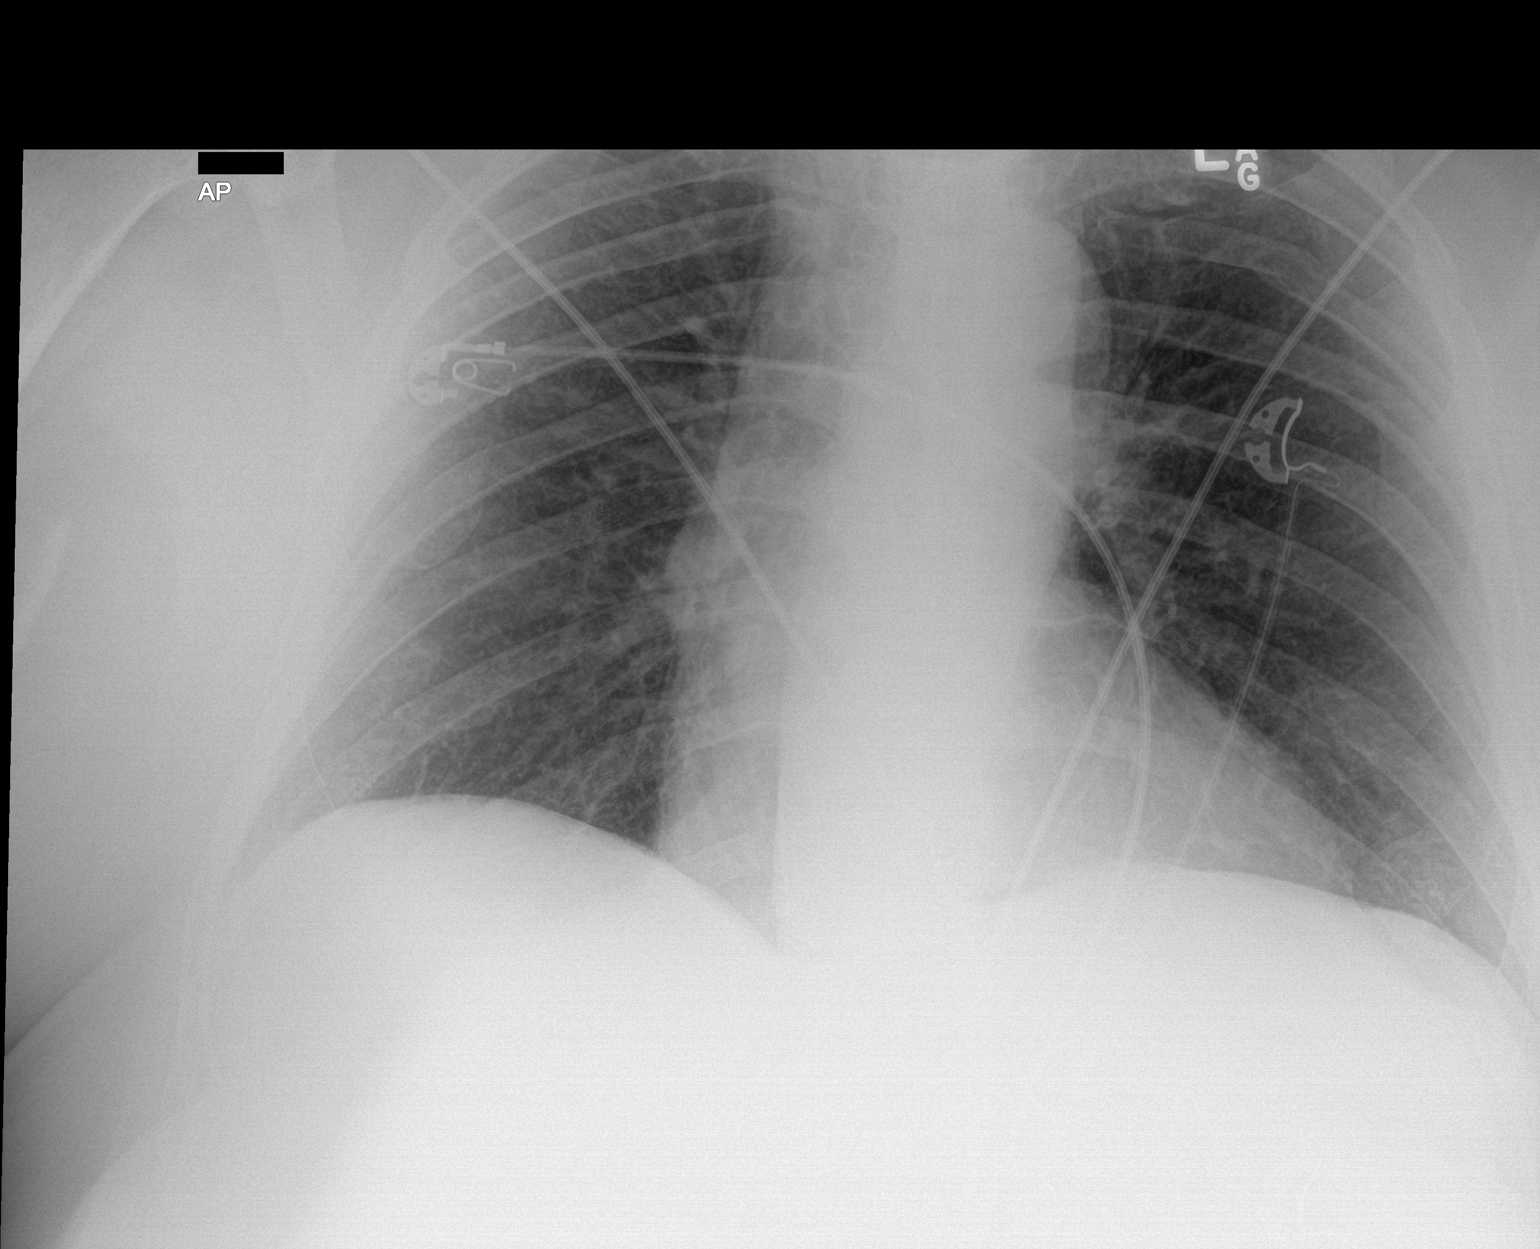
[im 2/2]
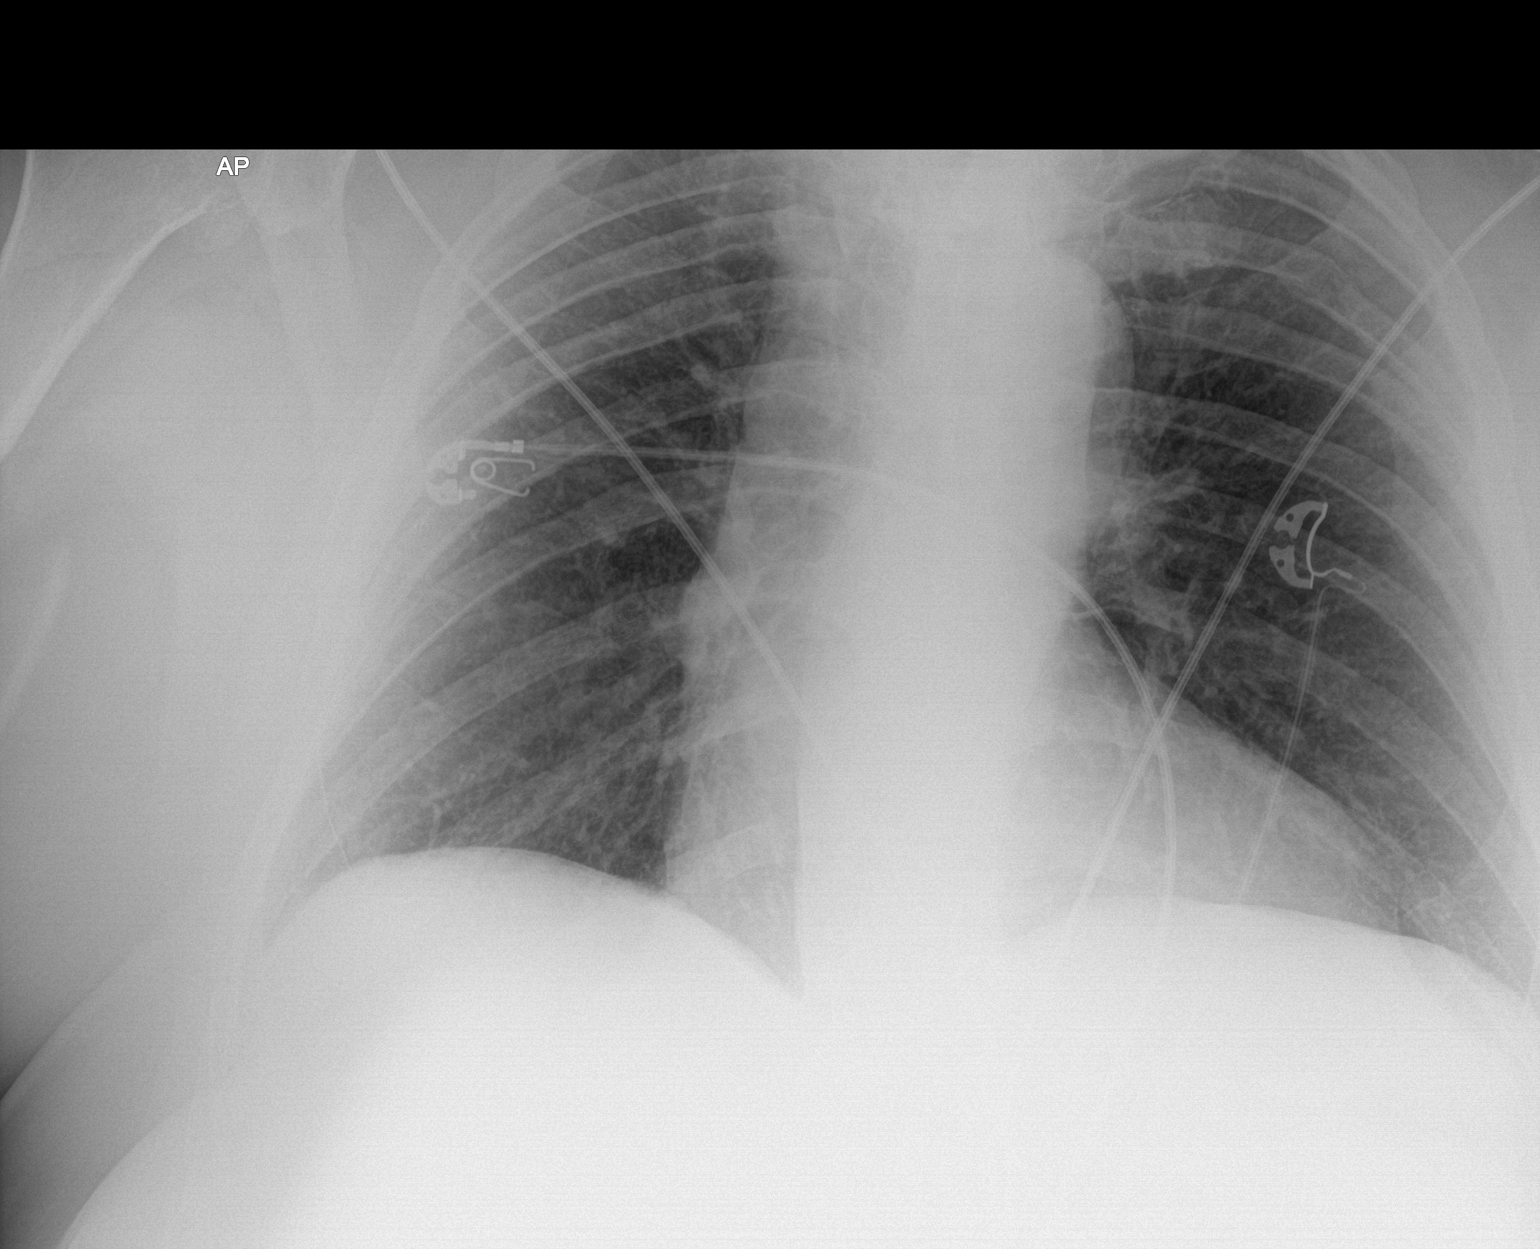

[2 of 2 positions shown; findings below may reference images not displayed]

FINDINGS: The heart size and mediastinal contours are within normal limits.
Both lungs are clear. The visualized skeletal structures are
unremarkable.
IMPRESSION: No active disease.

## 2020-02-27 ENCOUNTER — Other Ambulatory Visit: Payer: Self-pay | Admitting: Cardiovascular Disease

## 2020-03-02 DIAGNOSIS — I2510 Atherosclerotic heart disease of native coronary artery without angina pectoris: Secondary | ICD-10-CM | POA: Diagnosis not present

## 2020-03-02 DIAGNOSIS — J45909 Unspecified asthma, uncomplicated: Secondary | ICD-10-CM | POA: Diagnosis not present

## 2020-03-02 DIAGNOSIS — E7800 Pure hypercholesterolemia, unspecified: Secondary | ICD-10-CM | POA: Diagnosis not present

## 2020-03-02 DIAGNOSIS — Z6841 Body Mass Index (BMI) 40.0 and over, adult: Secondary | ICD-10-CM | POA: Diagnosis not present

## 2020-03-02 DIAGNOSIS — N400 Enlarged prostate without lower urinary tract symptoms: Secondary | ICD-10-CM | POA: Diagnosis not present

## 2020-03-02 DIAGNOSIS — I10 Essential (primary) hypertension: Secondary | ICD-10-CM | POA: Diagnosis not present

## 2020-04-06 DIAGNOSIS — N401 Enlarged prostate with lower urinary tract symptoms: Secondary | ICD-10-CM | POA: Diagnosis not present

## 2020-04-06 DIAGNOSIS — R351 Nocturia: Secondary | ICD-10-CM | POA: Diagnosis not present

## 2020-04-17 ENCOUNTER — Other Ambulatory Visit: Payer: Self-pay | Admitting: Cardiovascular Disease

## 2020-05-05 ENCOUNTER — Encounter: Payer: Self-pay | Admitting: Physician Assistant

## 2020-05-05 ENCOUNTER — Ambulatory Visit: Payer: PPO | Admitting: Physician Assistant

## 2020-05-05 ENCOUNTER — Other Ambulatory Visit: Payer: Self-pay

## 2020-05-05 ENCOUNTER — Ambulatory Visit: Payer: Self-pay

## 2020-05-05 DIAGNOSIS — M5441 Lumbago with sciatica, right side: Secondary | ICD-10-CM

## 2020-05-05 MED ORDER — METHOCARBAMOL 500 MG PO TABS: 500.0000 mg | ORAL_TABLET | Freq: Three times a day (TID) | ORAL | 1 refills | Status: DC

## 2020-05-05 MED ORDER — METHYLPREDNISOLONE 4 MG PO TABS: ORAL_TABLET | ORAL | 0 refills | Status: DC

## 2020-05-05 NOTE — Progress Notes (Signed)
Office Visit Note   Patient: Brad Woods           Date of Birth: 03/02/1946           MRN: [id number] Visit Date: 05/05/2020              Requested by: Joycelyn Rua, MD [address] [place],  [place] [address] PCP: Joycelyn Rua, MD   Assessment & Plan: Visit Diagnoses:  1. Right-sided low back pain with right-sided sciatica, unspecified chronicity     Plan: Place him on a Medrol Dosepak and Robaxin.  He will take no NSAIDs while on Medrol Dosepak.  We will send a formal physical therapy for range of motion back exercises strengthening modalities.  Follow-up with [place] in 4 weeks sooner if there is any worsening of his pain.  Questions were encouraged and answered at length.  Follow-Up Instructions: Return in about 4 weeks (around 06/02/2020).   Orders:  Orders Placed This Encounter  Procedures  . XR Lumbar Spine 2-3 Views   Meds ordered this encounter  Medications  . methylPREDNISolone (MEDROL) 4 MG tablet    Sig: Take as directed    Dispense:  21 tablet    Refill:  0  . methocarbamol (ROBAXIN) 500 MG tablet    Sig: Take 1 tablet (500 mg total) by mouth 3 (three) times daily.    Dispense:  40 tablet    Refill:  1      Procedures: No procedures performed   Clinical Data: No additional findings.   Subjective: Chief Complaint  Patient presents with  . Right Hip - Pain  . Lower Back - Pain    HPI Brad Woods comes in due to 1 week of pain from his right mid buttocks that radiates down to the to his knee area and posterior thigh.  Denies any groin pain.  No known injury.  Pain began after drive to [place].  Pain worse with going up and down steps.  Denies any numbness or tingling down either leg.  Denies any back pain or waking pain.  Denies any saddle anesthesia.  Tried Advil and some stretching.  He has some pain whenever he rolls over at night. Review of Systems Negative for fevers chills shortness of breath.  Objective: Vital Signs: There  were no vitals taken for this visit.  Physical Exam General: Well-developed well-nourished male no acute distress mood affect appropriate Psych: Alert and oriented x3 Vascular calves are supple nontender dorsal pedal pulses are 2+ equal and symmetric.  Early brawny changes bilateral lower extremities. Ortho Exam Lower extremities he has good range of motion bilateral hips without pain.  Nontender trochanteric region both hips.  Deep tendon reflexes are 2+ at knees and equal and symmetric.  5 out of 5 strength throughout the lower extremities against resistance.   Lumbar spine: Positive straight leg raise on the right negative on the left.  No significant tenderness over the spinal column or paraspinous region lower lumbar region.  Specialty Comments:  No specialty comments available.  Imaging: XR Lumbar Spine 2-3 Views  Result Date: 05/05/2020 Lumbar spine AP lateral views: No acute fractures.  Grade 1 spondylolisthesis L4 on 5.  L4-5,L5-S1changes.  Disc space spaces otherwise overall well-maintained.  Normal lordotic curvature.  Bilateral hips well located and appear well-preserved partially visualized on the AP view of the lumbar spine.    PMFS History: Patient Active Problem List   Diagnosis Date Noted  . Chronic pain of  left knee 09/02/2018  . Chronic pain of right knee 09/02/2018  . History of bilateral knee replacement 09/02/2018  . Allergic rhinitis 08/15/2018  . Erectile dysfunction 08/15/2018  . History of renal stone 08/15/2018  . Migraine 08/15/2018  . Osteoarthritis 08/15/2018  . Hyperlipidemia 08/15/2018  . PVD (peripheral vascular disease) (HCC) 08/15/2018  . Left shoulder pain 07/30/2018  . Chest pain 07/30/2018  . Trochanteric bursitis, left hip 05/02/2017  . Carpal tunnel syndrome, left upper limb 05/02/2017  . Trochanteric bursitis, right hip 03/18/2017  . Mixed hyperlipidemia 12/31/2016  . Coronary artery disease involving native coronary artery of native  heart with unstable angina pectoris (HCC) 11/25/2016  . Coronary artery disease involving native coronary artery of native heart without angina pectoris 11/20/2016  . Elevated troponin   . Unstable angina (HCC) 05/18/2016  . Acute coronary syndrome (HCC) 05/18/2016  . Benign non-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 10/12/2014  . Essential hypertension 10/12/2014  . 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 10/12/2014  . High risk medication use 10/12/2014  . IFG (impaired fasting glucose) 10/12/2014  . OSA (obstructive sleep apnea) 02/04/2014  . Hypothyroidism 03/02/2013  . GERD (gastroesophageal reflux disease) 09/04/2012   Past Medical History:  Diagnosis Date  . Allergy   . Arthritis   . Asthma    when younger  . GERD (gastroesophageal reflux disease)   . Headache   . Hyperlipidemia   . Hypertension   . Kidney stones   . Myocardial infarction (HCC)   . Sleep apnea    cpap    Family History  Problem Relation Age of Onset  . Colon cancer Mother 70    Past Surgical History:  Procedure Laterality Date  . CARDIAC CATHETERIZATION N/A 05/20/2016   Procedure: Left Heart Cath and Coronary Angiography;  Surgeon: Runell Gess, MD;  Location: [facility] INVASIVE CV LAB;  Service: Cardiovascular;  Laterality: N/A;  . CARDIAC CATHETERIZATION N/A 11/22/2016   Procedure: Left Heart Cath and Coronary Angiography;  Surgeon: Yvonne Kendall, MD;  Location: [facility] INVASIVE CV LAB;  Service: Cardiovascular;  Laterality: N/A;  . COLONOSCOPY    . EYE SURGERY    . RECTAL SURGERY    . REPLACEMENT TOTAL KNEE Bilateral    Social History   Occupational History  . Not on file  Tobacco Use  . Smoking status: Never Smoker  . Smokeless tobacco: Never Used  Substance and Sexual Activity  . Alcohol use: Yes    Alcohol/week: 0.0 standard drinks    Comment: occasionally  . Drug use: No  . Sexual activity: Yes    Birth control/protection: None

## 2020-05-26 ENCOUNTER — Other Ambulatory Visit: Payer: Self-pay

## 2020-05-26 ENCOUNTER — Ambulatory Visit: Payer: PPO | Admitting: Physician Assistant

## 2020-05-26 ENCOUNTER — Encounter: Payer: Self-pay | Admitting: Physician Assistant

## 2020-05-26 VITALS — BP 170/88 | HR 71 | Ht 74.0 in | Wt 309.0 lb

## 2020-05-26 DIAGNOSIS — I25118 Atherosclerotic heart disease of native coronary artery with other forms of angina pectoris: Secondary | ICD-10-CM

## 2020-05-26 DIAGNOSIS — G4733 Obstructive sleep apnea (adult) (pediatric): Secondary | ICD-10-CM | POA: Diagnosis not present

## 2020-05-26 DIAGNOSIS — I10 Essential (primary) hypertension: Secondary | ICD-10-CM | POA: Diagnosis not present

## 2020-05-26 DIAGNOSIS — E782 Mixed hyperlipidemia: Secondary | ICD-10-CM | POA: Diagnosis not present

## 2020-05-26 LAB — COMPREHENSIVE METABOLIC PANEL
ALT: 22 IU/L (ref 0–44)
AST: 22 IU/L (ref 0–40)
Albumin/Globulin Ratio: 1.9 (ref 1.2–2.2)
Albumin: 4.3 g/dL (ref 3.7–4.7)
Alkaline Phosphatase: 80 IU/L (ref 48–121)
BUN/Creatinine Ratio: 20 (ref 10–24)
BUN: 18 mg/dL (ref 8–27)
Bilirubin Total: 0.7 mg/dL (ref 0.0–1.2)
CO2: 24 mmol/L (ref 20–29)
Calcium: 9.1 mg/dL (ref 8.6–10.2)
Chloride: 100 mmol/L (ref 96–106)
Creatinine, Ser: 0.89 mg/dL (ref 0.76–1.27)
GFR calc Af Amer: 98 mL/min/{1.73_m2} (ref >59)
GFR calc non Af Amer: 85 mL/min/{1.73_m2} (ref >59)
Globulin, Total: 2.3 g/dL (ref 1.5–4.5)
Glucose: 97 mg/dL (ref 65–99)
Potassium: 4.9 mmol/L (ref 3.5–5.2)
Sodium: 137 mmol/L (ref 134–144)
Total Protein: 6.6 g/dL (ref 6.0–8.5)

## 2020-05-26 LAB — CBC
Hematocrit: 45.5 % (ref 37.5–51.0)
Hemoglobin: 15.5 g/dL (ref 13.0–17.7)
MCH: 30 pg (ref 26.6–33.0)
MCHC: 34.1 g/dL (ref 31.5–35.7)
MCV: 88 fL (ref 79–97)
Platelets: 164 10*3/uL (ref 150–450)
RBC: 5.17 x10E6/uL (ref 4.14–5.80)
RDW: 13.7 % (ref 11.6–15.4)
WBC: 5.6 10*3/uL (ref 3.4–10.8)

## 2020-05-26 MED ORDER — CARVEDILOL 12.5 MG PO TABS: 12.5000 mg | ORAL_TABLET | Freq: Two times a day (BID) | ORAL | 3 refills | Status: DC

## 2020-05-26 NOTE — H&P (View-Only) (Signed)
Cardiology Office Note    Date:  05/26/2020   ID:  Brad Woods, DOB 09-09-1946, MRN [id number]  PCP:  Joycelyn Rua, MD  Cardiologist:  Dr. Elease Hashimoto  Chief Complaint: 10  Months follow up  History of Present Illness:   Add Brad Woods is a 74 y.o. male with history of CAD s/p circumflex stenting 04/2016, hypertension, chronic diastolic congestive heart failure, hyperlipidemia, OSA and acid reflux presented for follow-up.   Last cath 10/2016 showed patent stent. Diffusely diseased small PDA, with 70% ostial stenosis and diffuse midvessel disease up to 50%.  Overall appearance is similar to catheterization from 04/2016. Medical therapy recommended. Changed Brillinta to Plavix due to dyspnea.   Here today for follow-up.  Due for labs.  Patient has dyspnea on exertion for past 3 to 4 months.  No exertional chest tightness or heaviness.  His symptoms resolved with rest.  No resting symptoms.  He has noted elevated blood pressure with systolic running in the range of 150s to 160s.  He has not been compliant with low-sodium diet.  Patient has obstructive sleep apnea but unable to tolerate CPAP.  Not able to exercise secondary to dyspnea.  He denies orthopnea, PND, syncope, dizziness or melena.  He has intermittent lower extremity edema.  Past Medical History:  Diagnosis Date  . Allergy   . Arthritis   . Asthma    when younger  . GERD (gastroesophageal reflux disease)   . Headache   . Hyperlipidemia   . Hypertension   . Kidney stones   . Myocardial infarction (HCC)   . Sleep apnea    cpap    Past Surgical History:  Procedure Laterality Date  . CARDIAC CATHETERIZATION N/A 05/20/2016   Procedure: Left Heart Cath and Coronary Angiography;  Surgeon: Runell Gess, MD;  Location: [facility] INVASIVE CV LAB;  Service: Cardiovascular;  Laterality: N/A;  . CARDIAC CATHETERIZATION N/A 11/22/2016   Procedure: Left Heart Cath and Coronary Angiography;  Surgeon: Yvonne Kendall, MD;  Location: [facility]  INVASIVE CV LAB;  Service: Cardiovascular;  Laterality: N/A;  . COLONOSCOPY    . EYE SURGERY    . RECTAL SURGERY    . REPLACEMENT TOTAL KNEE Bilateral     Current Medications: Prior to Admission medications   Medication Sig Start Date End Date Taking? Authorizing Provider  albuterol (PROVENTIL HFA;VENTOLIN HFA) 108 (90 Base) MCG/ACT inhaler Inhale 2 puffs into the lungs every 6 (six) hours as needed for wheezing or shortness of breath. 11/20/16   Nahser, Deloris Ping, MD  aspirin EC 81 MG EC tablet Take 1 tablet (81 mg total) by mouth daily. 05/22/16   Orpah Cobb, MD  carvedilol (COREG) 6.25 MG tablet Take 1 tablet (6.25 mg total) by mouth 2 (two) times daily. 01/08/19   Nahser, Deloris Ping, MD  clopidogrel (PLAVIX) 75 MG tablet TAKE 1 TABLET BY MOUTH EVERY DAY 04/19/20   Nahser, Deloris Ping, MD  finasteride (PROSCAR) 5 MG tablet Take 5 mg by mouth daily.    [provider]  fluticasone (FLONASE) 50 MCG/ACT nasal spray Place 2 sprays into the nose daily as needed for allergies.  12/07/13   [provider]  isosorbide mononitrate (IMDUR) 60 MG 24 hr tablet TAKE 1 TABLET BY MOUTH EVERY DAY 08/20/19   Nahser, Deloris Ping, MD  Magnesium Oxide (PHILLIPS) 500 MG (LAX) TABS Take 1-3 tablets by mouth every 4 (four) hours as needed (Heart burn).     [provider]  methocarbamol (ROBAXIN) 500  MG tablet Take 1 tablet (500 mg total) by mouth 3 (three) times daily. 05/05/20   Kirtland Bouchard, PA-C  methylPREDNISolone (MEDROL) 4 MG tablet Take as directed 05/05/20   Kirtland Bouchard, PA-C  Multiple Vitamins-Minerals (CENTRUM SILVER 50+MEN PO) Take 1 tablet by mouth every other day.     [provider]  nitroGLYCERIN (NITROSTAT) 0.4 MG SL tablet PLACE 1 TABLET (0.4 MG TOTAL) UNDER THE TONGUE EVERY 5 (FIVE) MINUTES AS NEEDED FOR CHEST PAIN. 08/04/18   Nahser, Deloris Ping, MD  REPATHA SURECLICK 140 MG/ML SOAJ INJECT 1 PEN INTO THE SKIN EVERY 14 (FOURTEEN) DAYS. 02/29/20   Nahser, Deloris Ping, MD    tamsulosin (FLOMAX) 0.4 MG CAPS capsule Take 1 capsule by mouth daily. 08/14/16   [provider]    Allergies:   Hydromorphone hcl, Hydromorphone, and Aleve [naproxen sodium]   Social History   Socioeconomic History  . Marital status: Married    Spouse name: Not on file  . Number of children: Not on file  . Years of education: Not on file  . Highest education level: Not on file  Occupational History  . Not on file  Tobacco Use  . Smoking status: Never Smoker  . Smokeless tobacco: Never Used  Substance and Sexual Activity  . Alcohol use: Yes    Alcohol/week: 0.0 standard drinks    Comment: occasionally  . Drug use: No  . Sexual activity: Yes    Birth control/protection: None  Other Topics Concern  . Not on file  Social History Narrative  . Not on file   Social Determinants of Health   Financial Resource Strain:   . Difficulty of Paying Living Expenses:   Food Insecurity:   . Worried About Programme researcher, broadcasting/film/video in the Last Year:   . Barista in the Last Year:   Transportation Needs:   . Freight forwarder (Medical):   Marland Kitchen Lack of Transportation (Non-Medical):   Physical Activity:   . Days of Exercise per Week:   . Minutes of Exercise per Session:   Stress:   . Feeling of Stress :   Social Connections:   . Frequency of Communication with Friends and Family:   . Frequency of Social Gatherings with Friends and Family:   . Attends Religious Services:   . Active Member of Clubs or Organizations:   . Attends Banker Meetings:   Marland Kitchen Marital Status:      Family History:  The patient's family history includes Colon cancer (age of onset: 59) in his mother.   ROS:   Please see the history of present illness.    ROS All other systems reviewed and are negative.   PHYSICAL EXAM:   VS:  BP (!) 170/88   Pulse 71   Ht 6\' 2"  (1.88 m)   Wt (!) 309 lb (140.2 kg)   SpO2 98%   BMI 39.67 kg/m    GEN: Well nourished, well developed, in no acute  distress  HEENT: normal  Neck: no JVD, carotid bruits, or masses Cardiac: RRR; no murmurs, rubs, or gallops, trace ankle edema  Respiratory:  clear to auscultation bilaterally, normal work of breathing GI: soft, nontender, nondistended, + BS MS: no deformity or atrophy  Skin: warm and dry, no rash Neuro:  Alert and Oriented x 3, Strength and sensation are intact Psych: euthymic mood, full affect  Wt Readings from Last 3 Encounters:  05/26/20 (!) 309 lb (140.2 kg)  08/13/19  296 lb (134.3 kg)  01/08/19 (!) 306 lb (138.8 kg)      Studies/Labs Reviewed:   EKG:  EKG is ordered today.  The ekg ordered today demonstrates normal sinus rhythm and right bundle branch block  Recent Labs: No results found for requested labs within last 8760 hours.   Lipid Panel    Component Value Date/Time   CHOL 114 01/07/2019 0806   TRIG 159 (H) 01/07/2019 0806   HDL 34 (L) 01/07/2019 0806   CHOLHDL 3.4 01/07/2019 0806   CHOLHDL 4.0 11/20/2016 1643   VLDL 31 (H) 11/20/2016 1643   LDLCALC 48 01/07/2019 0806    Additional studies/ records that were reviewed today include:   Left Heart Cath and Coronary Angiography 10/2016  Conclusion  Conclusions: 1. Diffuse coronary artery ectasia. 2. Diffusely diseased small PDA, with 70% ostial stenosis and diffuse midvessel disease up to 50%.  Overall appearance is similar to catheterization from 04/2016. 3. Mild to moderate disease involving LAD, diagonal branches, and LCx. 4. Widely patent mid LCx stent. 5. Mildly elevated left ventricular filling pressure. 6. Basal inferior akinesis with otherwise preserved left ventricular contraction (LVEF ~50%).  Recommendations: 1. Aggressive medical therapy, including escalation of isosorbide mononitrate, as tolerated. 2. Will switch from ticagrelor to clopidogrel, given patient's considerable shortness of breath since PCI in May. 3. Continue secondary prevention, as well as weight loss. 4. Follow-up with Dr.  Elease Hashimoto, as previously arranged.   Diagnostic Dominance: Right      ASSESSMENT & PLAN:   1.  Dyspnea on exertion Patient has 3 to 48-month history of progressive worsening dyspnea on exertion without chest tightness.  Symptoms resolved with rest.  He has no respiratory symptoms.  Symptoms similar to prior angina but very much less intensity. Patient denies orthopnea or PND.  If symptoms likely due to stable angina.  Discussed medical therapy versus evaluation with stress test versus coronary angiography.  After discussion patient prefers definitive evaluation by coronary angiography. The patient understands that risks include but are not limited to stroke (1 in 1000), death (1 in 1000), kidney failure [usually temporary] (1 in 500), bleeding (1 in 200), allergic reaction [possibly serious] (1 in 200), and agrees to proceed.   2. CAD s/p DES to Cx 04/2016 -Last cardiac cath in November 2017 as above.  Rule out worsening coronary artery disease as summarized above.  Continue dual antiplatelet therapy with aspirin and Plavix.  Increase Coreg as below.  continue Imdur 60 mg daily.  3.  HTN -Elevated in setting of dietary noncompliance.  I have advised him to cut back on salt intake. -Increase Coreg to 12.5 mg twice daily  4. HLD -He is nonfasting today.  Will check lipid panel at follow-up after cath. - Hx of statin intolerance. Continue Repatha.    Medication Adjustments/Labs and Tests Ordered: Current medicines are reviewed at length with the patient today.  Concerns regarding medicines are outlined above.  Medication changes, Labs and Tests ordered today are listed in the Patient Instructions below. Patient Instructions  Medication Instructions:  Your physician has recommended you make the following change in your medication:  1.  INCREASE the Carvedilol to 12.5 mg taking 1 tablet twice a day   *If you need a refill on your cardiac medications before your next appointment, please call  your pharmacy*   Lab Work: TODAY:   CMET & CBC  Saturday, 05/28/2020:  GO TO [facility] [address] AT 12:00 FOR COVID TEST.  ONCE YOU ARE TESTED YOU WILL NEED TO GO HOME AND QUARANTINE UNTIL AFTER YOUR PROCEDURE.   If you have labs (blood work) drawn today and your tests are completely normal, you will receive your results only by: Marland Kitchen MyChart Message (if you have MyChart) OR . A paper copy in the mail If you have any lab test that is abnormal or we need to change your treatment, we will call you to review the results.   Testing/Procedures: Your physician has requested that you have a cardiac catheterization. Cardiac catheterization is used to diagnose and/or treat various heart conditions. Doctors may recommend this procedure for a number of different reasons. The most common reason is to evaluate chest pain. Chest pain can be a symptom of coronary artery disease (CAD), and cardiac catheterization can show whether plaque is narrowing or blocking your heart's arteries. This procedure is also used to evaluate the valves, as well as measure the blood flow and oxygen levels in different parts of your heart. For further information please visit [email or web address] Please follow instruction sheet, BELOW.      [facility] CARDIOVASCULAR DIVISION [facility] ST OFFICE [address] [place] [address] Dept: [phone] Loc: [phone]  Klayten Yoshimoto Beem  05/26/2020  You are scheduled for a Cardiac Catheterization on Tuesday, June 8 with Dr. Nicki Guadalajara.  1. Please arrive at the [facility] (Main Entrance A) at [facility]: [address] [place], [place] [address] at 5:30 AM (This time is two hours before your procedure to ensure your preparation). Free valet parking service is available.   Special note: Every effort is made to have your procedure done on time. Please understand that emergencies sometimes  delay scheduled procedures.  2. Diet: Do not eat solid foods after midnight.  The patient may have clear liquids until 5am upon the day of the procedure.  3. Labs: You will need to have blood drawn on TODAY  4. Medication instructions in preparation for your procedure:   Contrast Allergy: No   On the morning of your procedure, take your Aspirin and Plavix/Clopidogrel and any morning medicines NOT listed above.  You may use sips of water.  5. Plan for one night stay--bring personal belongings. 6. Bring a current list of your medications and current insurance cards. 7. You MUST have a responsible person to drive you home. 8. Someone MUST be with you the first 24 hours after you arrive home or your discharge will be delayed. 9. Please wear clothes that are easy to get on and off and wear slip-on shoes.  Thank you for allowing [place] to care for you!   -- [place] [facility]      Follow-Up: At [facility], you and your health needs are our priority.  As part of our continuing mission to provide you with exceptional heart care, we have created designated Provider Care Teams.  These Care Teams include your primary Cardiologist (physician) and Advanced Practice Providers (APPs -  Physician Assistants and Nurse Practitioners) who all work together to provide you with the care you need, when you need it.  We recommend signing up for the patient portal called "MyChart".  Sign up information is provided on this After Visit Summary.  MyChart is used to connect with patients for Virtual Visits (Telemedicine).  Patients are able to view lab/test results, encounter notes, upcoming appointments, etc.  Non-urgent messages can be sent to your provider as well.  To learn more about what you can do with MyChart, go to [email or web address].    Your next appointment:   6 month(s)  The format for your next appointment:   In Person  Provider:   You may see Kristeen Miss, MD  or one of the following Advanced Practice Providers on your designated Care Team:    Tereso Newcomer, PA-C  Chelsea Aus, PA-C    Other Instructions      Lorelei Pont, [place]  05/26/2020 11:16 AM    [facility] [address] [place], [place], [place]  [address] Phone: [phone]; Fax: [phone]

## 2020-05-26 NOTE — Patient Instructions (Addendum)
Medication Instructions:  Your physician has recommended you make the following change in your medication:  1.  INCREASE the Carvedilol to 12.5 mg taking 1 tablet twice a day   *If you need a refill on your cardiac medications before your next appointment, please call your pharmacy*   Lab Work: TODAY:   CMET & CBC  Saturday, 05/28/2020:  GO TO [address] AT 12:00 FOR COVID TEST.  ONCE YOU ARE TESTED YOU WILL NEED TO GO HOME AND QUARANTINE UNTIL AFTER YOUR PROCEDURE.   If you have labs (blood work) drawn today and your tests are completely normal, you will receive your results only by: Marland Kitchen MyChart Message (if you have MyChart) OR . A paper copy in the mail If you have any lab test that is abnormal or we need to change your treatment, we will call you to review the results.   Testing/Procedures: Your physician has requested that you have a cardiac catheterization. Cardiac catheterization is used to diagnose and/or treat various heart conditions. Doctors may recommend this procedure for a number of different reasons. The most common reason is to evaluate chest pain. Chest pain can be a symptom of coronary artery disease (CAD), and cardiac catheterization can show whether plaque is narrowing or blocking your heart's arteries. This procedure is also used to evaluate the valves, as well as measure the blood flow and oxygen levels in different parts of your heart. For further information please visit [email or web address]. Please follow instruction sheet, BELOW.      [place] OFFICE [place], [place] [address] Dept: [phone] Loc: [place]  05/26/2020  You are scheduled for a Cardiac Catheterization on Tuesday, June 8 with Dr. Shelva Majestic.  1. Please arrive at the [facility] (Main Entrance A) at [facility]: [address] [place], [place] [address] at 5:30 AM (This time is two hours before your procedure to ensure your preparation). Free valet parking service is available.   Special note: Every effort is made to have your procedure done on time. Please understand that emergencies sometimes delay scheduled procedures.  2. Diet: Do not eat solid foods after midnight.  The patient may have clear liquids until 5am upon the day of the procedure.  3. Labs: You will need to have blood drawn on TODAY  4. Medication instructions in preparation for your procedure:   Contrast Allergy: No   On the morning of your procedure, take your Aspirin and Plavix/Clopidogrel and any morning medicines NOT listed above.  You may use sips of water.  5. Plan for one night stay--bring personal belongings. 6. Bring a current list of your medications and current insurance cards. 7. You MUST have a responsible person to drive you home. 8. Someone MUST be with you the first 24 hours after you arrive home or your discharge will be delayed. 9. Please wear clothes that are easy to get on and off and wear slip-on shoes.  Thank you for allowing [place] to care for you!   -- [place] [facility]      Follow-Up: At [facility], you and your health needs are our priority.  As part of our continuing mission to provide you with exceptional heart care, we have created designated Provider Care Teams.  These Care Teams include your primary Cardiologist (physician) and Advanced Practice Providers (APPs -  Physician  Assistants and Nurse Practitioners) who all work together to provide you with the care you need, when you need it.  We recommend signing up for the patient portal called "MyChart".  Sign up information is provided on this After Visit Summary.  MyChart is used to connect with patients for Virtual Visits (Telemedicine).  Patients are able to view lab/test results, encounter notes, upcoming appointments, etc.   Non-urgent messages can be sent to your provider as well.   To learn more about what you can do with MyChart, go to [email or web address].    Your next appointment:   6 month(s)  The format for your next appointment:   In Person  Provider:   You may see Mertie Moores, MD or one of the following Advanced Practice Providers on your designated Care Team:    Richardson Dopp, PA-C  Robbie Lis, [place]    Other Instructions

## 2020-05-26 NOTE — Progress Notes (Signed)
Cardiology Office Note    Date:  05/26/2020   ID:  [name], DOB 09-09-1946, MRN [id number]  PCP:  Joycelyn Rua, MD  Cardiologist:  Dr. Elease Hashimoto  Chief Complaint: 10  Months follow up  History of Present Illness:   Add Brad Woods is a 74 y.o. male with history of CAD s/p circumflex stenting 04/2016, hypertension, chronic diastolic congestive heart failure, hyperlipidemia, OSA and acid reflux presented for follow-up.   Last cath 10/2016 showed patent stent. Diffusely diseased small PDA, with 70% ostial stenosis and diffuse midvessel disease up to 50%.  Overall appearance is similar to catheterization from 04/2016. Medical therapy recommended. Changed Brillinta to Plavix due to dyspnea.   Here today for follow-up.  Due for labs.  Patient has dyspnea on exertion for past 3 to 4 months.  No exertional chest tightness or heaviness.  His symptoms resolved with rest.  No resting symptoms.  He has noted elevated blood pressure with systolic running in the range of 150s to 160s.  He has not been compliant with low-sodium diet.  Patient has obstructive sleep apnea but unable to tolerate CPAP.  Not able to exercise secondary to dyspnea.  He denies orthopnea, PND, syncope, dizziness or melena.  He has intermittent lower extremity edema.  Past Medical History:  Diagnosis Date  . Allergy   . Arthritis   . Asthma    when younger  . GERD (gastroesophageal reflux disease)   . Headache   . Hyperlipidemia   . Hypertension   . Kidney stones   . Myocardial infarction (HCC)   . Sleep apnea    cpap    Past Surgical History:  Procedure Laterality Date  . CARDIAC CATHETERIZATION N/A 05/20/2016   Procedure: Left Heart Cath and Coronary Angiography;  Surgeon: Runell Gess, MD;  Location: [facility] INVASIVE CV LAB;  Service: Cardiovascular;  Laterality: N/A;  . CARDIAC CATHETERIZATION N/A 11/22/2016   Procedure: Left Heart Cath and Coronary Angiography;  Surgeon: Yvonne Kendall, MD;  Location: [facility]  INVASIVE CV LAB;  Service: Cardiovascular;  Laterality: N/A;  . COLONOSCOPY    . EYE SURGERY    . RECTAL SURGERY    . REPLACEMENT TOTAL KNEE Bilateral     Current Medications: Prior to Admission medications   Medication Sig Start Date End Date Taking? Authorizing Provider  albuterol (PROVENTIL HFA;VENTOLIN HFA) 108 (90 Base) MCG/ACT inhaler Inhale 2 puffs into the lungs every 6 (six) hours as needed for wheezing or shortness of breath. 11/20/16   Nahser, Deloris Ping, MD  aspirin EC 81 MG EC tablet Take 1 tablet (81 mg total) by mouth daily. 05/22/16   Orpah Cobb, MD  carvedilol (COREG) 6.25 MG tablet Take 1 tablet (6.25 mg total) by mouth 2 (two) times daily. 01/08/19   Nahser, Deloris Ping, MD  clopidogrel (PLAVIX) 75 MG tablet TAKE 1 TABLET BY MOUTH EVERY DAY 04/19/20   Nahser, Deloris Ping, MD  finasteride (PROSCAR) 5 MG tablet Take 5 mg by mouth daily.    [provider]  fluticasone (FLONASE) 50 MCG/ACT nasal spray Place 2 sprays into the nose daily as needed for allergies.  12/07/13   [provider]  isosorbide mononitrate (IMDUR) 60 MG 24 hr tablet TAKE 1 TABLET BY MOUTH EVERY DAY 08/20/19   Nahser, Deloris Ping, MD  Magnesium Oxide (PHILLIPS) 500 MG (LAX) TABS Take 1-3 tablets by mouth every 4 (four) hours as needed (Heart burn).     [provider]  methocarbamol (ROBAXIN) 500  MG tablet Take 1 tablet (500 mg total) by mouth 3 (three) times daily. 05/05/20   Kirtland Bouchard, PA-C  methylPREDNISolone (MEDROL) 4 MG tablet Take as directed 05/05/20   Kirtland Bouchard, PA-C  Multiple Vitamins-Minerals (CENTRUM SILVER 50+MEN PO) Take 1 tablet by mouth every other day.     [provider]  nitroGLYCERIN (NITROSTAT) 0.4 MG SL tablet PLACE 1 TABLET (0.4 MG TOTAL) UNDER THE TONGUE EVERY 5 (FIVE) MINUTES AS NEEDED FOR CHEST PAIN. 08/04/18   Nahser, Deloris Ping, MD  REPATHA SURECLICK 140 MG/ML SOAJ INJECT 1 PEN INTO THE SKIN EVERY 14 (FOURTEEN) DAYS. 02/29/20   Nahser, Deloris Ping, MD    tamsulosin (FLOMAX) 0.4 MG CAPS capsule Take 1 capsule by mouth daily. 08/14/16   [provider]    Allergies:   Hydromorphone hcl, Hydromorphone, and Aleve [naproxen sodium]   Social History   Socioeconomic History  . Marital status: Married    Spouse name: Not on file  . Number of children: Not on file  . Years of education: Not on file  . Highest education level: Not on file  Occupational History  . Not on file  Tobacco Use  . Smoking status: Never Smoker  . Smokeless tobacco: Never Used  Substance and Sexual Activity  . Alcohol use: Yes    Alcohol/week: 0.0 standard drinks    Comment: occasionally  . Drug use: No  . Sexual activity: Yes    Birth control/protection: None  Other Topics Concern  . Not on file  Social History Narrative  . Not on file   Social Determinants of Health   Financial Resource Strain:   . Difficulty of Paying Living Expenses:   Food Insecurity:   . Worried About Programme researcher, broadcasting/film/video in the Last Year:   . Barista in the Last Year:   Transportation Needs:   . Freight forwarder (Medical):   Marland Kitchen Lack of Transportation (Non-Medical):   Physical Activity:   . Days of Exercise per Week:   . Minutes of Exercise per Session:   Stress:   . Feeling of Stress :   Social Connections:   . Frequency of Communication with Friends and Family:   . Frequency of Social Gatherings with Friends and Family:   . Attends Religious Services:   . Active Member of Clubs or Organizations:   . Attends Banker Meetings:   Marland Kitchen Marital Status:      Family History:  The patient's family history includes Colon cancer (age of onset: 59) in his mother.   ROS:   Please see the history of present illness.    ROS All other systems reviewed and are negative.   PHYSICAL EXAM:   VS:  BP (!) 170/88   Pulse 71   Ht 6\' 2"  (1.88 m)   Wt (!) 309 lb (140.2 kg)   SpO2 98%   BMI 39.67 kg/m    GEN: Well nourished, well developed, in no acute  distress  HEENT: normal  Neck: no JVD, carotid bruits, or masses Cardiac: RRR; no murmurs, rubs, or gallops, trace ankle edema  Respiratory:  clear to auscultation bilaterally, normal work of breathing GI: soft, nontender, nondistended, + BS MS: no deformity or atrophy  Skin: warm and dry, no rash Neuro:  Alert and Oriented x 3, Strength and sensation are intact Psych: euthymic mood, full affect  Wt Readings from Last 3 Encounters:  05/26/20 (!) 309 lb (140.2 kg)  08/13/19  296 lb (134.3 kg)  01/08/19 (!) 306 lb (138.8 kg)      Studies/Labs Reviewed:   EKG:  EKG is ordered today.  The ekg ordered today demonstrates normal sinus rhythm and right bundle branch block  Recent Labs: No results found for requested labs within last 8760 hours.   Lipid Panel    Component Value Date/Time   CHOL 114 01/07/2019 0806   TRIG 159 (H) 01/07/2019 0806   HDL 34 (L) 01/07/2019 0806   CHOLHDL 3.4 01/07/2019 0806   CHOLHDL 4.0 11/20/2016 1643   VLDL 31 (H) 11/20/2016 1643   LDLCALC 48 01/07/2019 0806    Additional studies/ records that were reviewed today include:   Left Heart Cath and Coronary Angiography 10/2016  Conclusion  Conclusions: 1. Diffuse coronary artery ectasia. 2. Diffusely diseased small PDA, with 70% ostial stenosis and diffuse midvessel disease up to 50%.  Overall appearance is similar to catheterization from 04/2016. 3. Mild to moderate disease involving LAD, diagonal branches, and LCx. 4. Widely patent mid LCx stent. 5. Mildly elevated left ventricular filling pressure. 6. Basal inferior akinesis with otherwise preserved left ventricular contraction (LVEF ~50%).  Recommendations: 1. Aggressive medical therapy, including escalation of isosorbide mononitrate, as tolerated. 2. Will switch from ticagrelor to clopidogrel, given patient's considerable shortness of breath since PCI in May. 3. Continue secondary prevention, as well as weight loss. 4. Follow-up with Dr.  Elease Hashimoto, as previously arranged.   Diagnostic Dominance: Right      ASSESSMENT & PLAN:   1.  Dyspnea on exertion Patient has 3 to 48-month history of progressive worsening dyspnea on exertion without chest tightness.  Symptoms resolved with rest.  He has no respiratory symptoms.  Symptoms similar to prior angina but very much less intensity. Patient denies orthopnea or PND.  If symptoms likely due to stable angina.  Discussed medical therapy versus evaluation with stress test versus coronary angiography.  After discussion patient prefers definitive evaluation by coronary angiography. The patient understands that risks include but are not limited to stroke (1 in 1000), death (1 in 1000), kidney failure [usually temporary] (1 in 500), bleeding (1 in 200), allergic reaction [possibly serious] (1 in 200), and agrees to proceed.   2. CAD s/p DES to Cx 04/2016 -Last cardiac cath in November 2017 as above.  Rule out worsening coronary artery disease as summarized above.  Continue dual antiplatelet therapy with aspirin and Plavix.  Increase Coreg as below.  continue Imdur 60 mg daily.  3.  HTN -Elevated in setting of dietary noncompliance.  I have advised him to cut back on salt intake. -Increase Coreg to 12.5 mg twice daily  4. HLD -He is nonfasting today.  Will check lipid panel at follow-up after cath. - Hx of statin intolerance. Continue Repatha.    Medication Adjustments/Labs and Tests Ordered: Current medicines are reviewed at length with the patient today.  Concerns regarding medicines are outlined above.  Medication changes, Labs and Tests ordered today are listed in the Patient Instructions below. Patient Instructions  Medication Instructions:  Your physician has recommended you make the following change in your medication:  1.  INCREASE the Carvedilol to 12.5 mg taking 1 tablet twice a day   *If you need a refill on your cardiac medications before your next appointment, please call  your pharmacy*   Lab Work: TODAY:   CMET & CBC  Saturday, 05/28/2020:  GO TO [facility] [address] AT 12:00 FOR COVID TEST.  ONCE YOU ARE TESTED YOU WILL NEED TO GO HOME AND QUARANTINE UNTIL AFTER YOUR PROCEDURE.   If you have labs (blood work) drawn today and your tests are completely normal, you will receive your results only by: Marland Kitchen MyChart Message (if you have MyChart) OR . A paper copy in the mail If you have any lab test that is abnormal or we need to change your treatment, we will call you to review the results.   Testing/Procedures: Your physician has requested that you have a cardiac catheterization. Cardiac catheterization is used to diagnose and/or treat various heart conditions. Doctors may recommend this procedure for a number of different reasons. The most common reason is to evaluate chest pain. Chest pain can be a symptom of coronary artery disease (CAD), and cardiac catheterization can show whether plaque is narrowing or blocking your heart's arteries. This procedure is also used to evaluate the valves, as well as measure the blood flow and oxygen levels in different parts of your heart. For further information please visit [email or web address] Please follow instruction sheet, BELOW.      [facility] CARDIOVASCULAR DIVISION [facility] ST OFFICE [address] [place] [address] Dept: [phone] Loc: [phone]  Klayten Yoshimoto Beem  05/26/2020  You are scheduled for a Cardiac Catheterization on Tuesday, June 8 with Dr. Nicki Guadalajara.  1. Please arrive at the [facility] (Main Entrance A) at [facility]: [address] [place], [place] [address] at 5:30 AM (This time is two hours before your procedure to ensure your preparation). Free valet parking service is available.   Special note: Every effort is made to have your procedure done on time. Please understand that emergencies sometimes  delay scheduled procedures.  2. Diet: Do not eat solid foods after midnight.  The patient may have clear liquids until 5am upon the day of the procedure.  3. Labs: You will need to have blood drawn on TODAY  4. Medication instructions in preparation for your procedure:   Contrast Allergy: No   On the morning of your procedure, take your Aspirin and Plavix/Clopidogrel and any morning medicines NOT listed above.  You may use sips of water.  5. Plan for one night stay--bring personal belongings. 6. Bring a current list of your medications and current insurance cards. 7. You MUST have a responsible person to drive you home. 8. Someone MUST be with you the first 24 hours after you arrive home or your discharge will be delayed. 9. Please wear clothes that are easy to get on and off and wear slip-on shoes.  Thank you for allowing [place] to care for you!   -- [facility]      Follow-Up: At [facility], you and your health needs are our priority.  As part of our continuing mission to provide you with exceptional heart care, we have created designated Provider Care Teams.  These Care Teams include your primary Cardiologist (physician) and Advanced Practice Providers (APPs -  Physician Assistants and Nurse Practitioners) who all work together to provide you with the care you need, when you need it.  We recommend signing up for the patient portal called "MyChart".  Sign up information is provided on this After Visit Summary.  MyChart is used to connect with patients for Virtual Visits (Telemedicine).  Patients are able to view lab/test results, encounter notes, upcoming appointments, etc.  Non-urgent messages can be sent to your provider as well.  To learn more about what you can do with MyChart, go to [email or web address].    Your next appointment:   6 month(s)  The format for your next appointment:   In Person  Provider:   You may see Kristeen Miss, MD  or one of the following Advanced Practice Providers on your designated Care Team:    Tereso Newcomer, PA-C  Chelsea Aus, PA-C    Other Instructions      Lorelei Pont, [place]  05/26/2020 11:16 AM    [facility] [address] [place], [place], [place]  [address] Phone: [phone]; Fax: [phone]

## 2020-05-28 ENCOUNTER — Other Ambulatory Visit (HOSPITAL_COMMUNITY)
Admission: RE | Admit: 2020-05-28 | Discharge: 2020-05-28 | Disposition: A | Discharge status: Home / Self Care | Payer: PPO | Source: Ambulatory Visit | Attending: Cardiovascular Disease | Admitting: Cardiovascular Disease

## 2020-05-28 DIAGNOSIS — Z01812 Encounter for preprocedural laboratory examination: Secondary | ICD-10-CM | POA: Insufficient documentation

## 2020-05-28 DIAGNOSIS — Z20822 Contact with and (suspected) exposure to covid-19: Secondary | ICD-10-CM | POA: Insufficient documentation

## 2020-05-28 LAB — SARS CORONAVIRUS 2 (TAT 6-24 HRS): SARS Coronavirus 2: NEGATIVE

## 2020-05-30 ENCOUNTER — Other Ambulatory Visit: Payer: Self-pay | Admitting: Cardiovascular Disease

## 2020-05-30 ENCOUNTER — Telehealth: Payer: Self-pay | Admitting: *Deleted

## 2020-05-30 NOTE — Telephone Encounter (Signed)
Pt contacted pre-catheterization scheduled at [facility] for: Tuesday May 31, 2020 7:30 AM Verified arrival time and place: [facility]) at: 5:30 AM   No solid food after midnight prior to cath, clear liquids until 5 AM day of procedure.   AM meds can be  taken pre-cath with sip of water including: ASA 81 mg Plavix 75 mg   Confirmed patient has responsible adult to drive home post procedure and observe 24 hours after arriving home: yes  You are allowed ONE visitor in the waiting room during your procedure. Both you and your visitor must wear masks.      COVID-19 Pre-Screening Questions:  . In the past 7 to 10 days have you had a cough,  shortness of breath, headache, congestion, fever (100 or greater) body aches, chills, sore throat, or sudden loss of taste or sense of smell? Shortness of breath, not new . Have you been around anyone with known Covid 19 in the past 7 to 10 days? no . Have you been around anyone who is awaiting Covid 19 test results in the past 7 to 10 days? no . Have you been around anyone who has mentioned symptoms of Covid 19 within the past 7 to 10 days? No  Reviewed procedure/mask/visitor instructions, COVID-19 screening questions with patient.

## 2020-05-30 NOTE — Telephone Encounter (Signed)
We will await the findings of the catheterization to have better information concerning going out of town

## 2020-05-30 NOTE — Telephone Encounter (Addendum)
If  cath results okay tomorrow, pt states he plans to go out of town from June 15-29, 2021. Pt asked me to reschedule post cath appt already scheduled with Dr Acie Fredrickson  06/15/20. Pt advised I will leave appt scheduled for 06/15/20 until after cath tomorrow, then if okay, appt can be cancelled. I did schedule another appt with Dr Acie Fredrickson for 06/24/20 in case 06/15/20 appt is not needed.  Pt states he will discuss plans to go out of town with Dr Claiborne Billings after the procedure tomorrow.  I will forward to Dr Acie Fredrickson and Dr Claiborne Billings for review.

## 2020-05-31 ENCOUNTER — Ambulatory Visit (HOSPITAL_COMMUNITY)
Admission: RE | Admit: 2020-05-31 | Discharge: 2020-05-31 | Disposition: A | Discharge status: Home / Self Care | Payer: PPO | Attending: Cardiovascular Disease | Admitting: Cardiovascular Disease

## 2020-05-31 ENCOUNTER — Other Ambulatory Visit: Payer: Self-pay

## 2020-05-31 ENCOUNTER — Encounter (HOSPITAL_COMMUNITY)
Admission: RE | Disposition: A | Discharge status: Home / Self Care | Payer: Self-pay | Source: Home / Self Care | Attending: Cardiovascular Disease

## 2020-05-31 DIAGNOSIS — I110 Hypertensive heart disease with heart failure: Secondary | ICD-10-CM | POA: Insufficient documentation

## 2020-05-31 DIAGNOSIS — G4733 Obstructive sleep apnea (adult) (pediatric): Secondary | ICD-10-CM | POA: Insufficient documentation

## 2020-05-31 DIAGNOSIS — I252 Old myocardial infarction: Secondary | ICD-10-CM | POA: Insufficient documentation

## 2020-05-31 DIAGNOSIS — R0600 Dyspnea, unspecified: Secondary | ICD-10-CM | POA: Diagnosis not present

## 2020-05-31 DIAGNOSIS — E782 Mixed hyperlipidemia: Secondary | ICD-10-CM | POA: Insufficient documentation

## 2020-05-31 DIAGNOSIS — Z7982 Long term (current) use of aspirin: Secondary | ICD-10-CM | POA: Insufficient documentation

## 2020-05-31 DIAGNOSIS — K219 Gastro-esophageal reflux disease without esophagitis: Secondary | ICD-10-CM | POA: Diagnosis not present

## 2020-05-31 DIAGNOSIS — R0609 Other forms of dyspnea: Secondary | ICD-10-CM | POA: Diagnosis not present

## 2020-05-31 DIAGNOSIS — Z79899 Other long term (current) drug therapy: Secondary | ICD-10-CM | POA: Diagnosis not present

## 2020-05-31 DIAGNOSIS — Z955 Presence of coronary angioplasty implant and graft: Secondary | ICD-10-CM | POA: Diagnosis not present

## 2020-05-31 DIAGNOSIS — I25119 Atherosclerotic heart disease of native coronary artery with unspecified angina pectoris: Secondary | ICD-10-CM | POA: Diagnosis not present

## 2020-05-31 DIAGNOSIS — I5032 Chronic diastolic (congestive) heart failure: Secondary | ICD-10-CM | POA: Insufficient documentation

## 2020-05-31 DIAGNOSIS — Z96653 Presence of artificial knee joint, bilateral: Secondary | ICD-10-CM | POA: Insufficient documentation

## 2020-05-31 HISTORY — PX: LEFT HEART CATH AND CORONARY ANGIOGRAPHY: CATH118249

## 2020-05-31 SURGERY — LEFT HEART CATH AND CORONARY ANGIOGRAPHY
Anesthesia: LOCAL

## 2020-05-31 MED ORDER — FENTANYL CITRATE (PF) 100 MCG/2ML IJ SOLN
INTRAMUSCULAR | Status: AC
  Filled 2020-05-31: qty 2

## 2020-05-31 MED ORDER — DIAZEPAM 5 MG PO TABS: 5.0000 mg | ORAL_TABLET | Freq: Four times a day (QID) | ORAL | Status: DC | PRN

## 2020-05-31 MED ORDER — HEPARIN (PORCINE) IN NACL 1000-0.9 UT/500ML-% IV SOLN
INTRAVENOUS | Status: AC
  Filled 2020-05-31: qty 1000

## 2020-05-31 MED ORDER — SODIUM CHLORIDE 0.9% FLUSH: 3.0000 mL | Freq: Two times a day (BID) | INTRAVENOUS | Status: DC

## 2020-05-31 MED ORDER — SODIUM CHLORIDE 0.9 % IV SOLN: INTRAVENOUS | Status: DC

## 2020-05-31 MED ORDER — SODIUM CHLORIDE 0.9 % WEIGHT BASED INFUSION
3.0000 mL/kg/h | INTRAVENOUS | Status: AC
  Administered 2020-05-31: 3 mL/kg/h via INTRAVENOUS

## 2020-05-31 MED ORDER — ACETAMINOPHEN 325 MG PO TABS
650.0000 mg | ORAL_TABLET | ORAL | Status: DC | PRN
  Administered 2020-05-31: 650 mg via ORAL
  Filled 2020-05-31: qty 2

## 2020-05-31 MED ORDER — ONDANSETRON HCL 4 MG/2ML IJ SOLN: 4.0000 mg | Freq: Four times a day (QID) | INTRAMUSCULAR | Status: DC | PRN

## 2020-05-31 MED ORDER — SODIUM CHLORIDE 0.9 % IV SOLN: 250.0000 mL | INTRAVENOUS | Status: DC | PRN

## 2020-05-31 MED ORDER — LABETALOL HCL 5 MG/ML IV SOLN: 10.0000 mg | INTRAVENOUS | Status: DC | PRN

## 2020-05-31 MED ORDER — VERAPAMIL HCL 2.5 MG/ML IV SOLN
INTRAVENOUS | Status: DC | PRN
  Administered 2020-05-31: 10 mL via INTRA_ARTERIAL

## 2020-05-31 MED ORDER — IOHEXOL 350 MG/ML SOLN
INTRAVENOUS | Status: DC | PRN
  Administered 2020-05-31: 90 mL

## 2020-05-31 MED ORDER — ASPIRIN 81 MG PO CHEW: 81.0000 mg | CHEWABLE_TABLET | Freq: Every day | ORAL | Status: DC

## 2020-05-31 MED ORDER — HYDRALAZINE HCL 20 MG/ML IJ SOLN: 10.0000 mg | INTRAMUSCULAR | Status: DC | PRN

## 2020-05-31 MED ORDER — MIDAZOLAM HCL 2 MG/2ML IJ SOLN
INTRAMUSCULAR | Status: DC | PRN
  Administered 2020-05-31: 2 mg via INTRAVENOUS

## 2020-05-31 MED ORDER — HEPARIN SODIUM (PORCINE) 1000 UNIT/ML IJ SOLN
INTRAMUSCULAR | Status: AC
  Filled 2020-05-31: qty 1

## 2020-05-31 MED ORDER — SODIUM CHLORIDE 0.9% FLUSH: 3.0000 mL | INTRAVENOUS | Status: DC | PRN

## 2020-05-31 MED ORDER — HEPARIN (PORCINE) IN NACL 1000-0.9 UT/500ML-% IV SOLN
INTRAVENOUS | Status: DC | PRN
  Administered 2020-05-31 (×2): 500 mL

## 2020-05-31 MED ORDER — VERAPAMIL HCL 2.5 MG/ML IV SOLN
INTRAVENOUS | Status: AC
  Filled 2020-05-31: qty 2

## 2020-05-31 MED ORDER — LIDOCAINE HCL (PF) 1 % IJ SOLN
INTRAMUSCULAR | Status: DC | PRN
  Administered 2020-05-31: 2 mL

## 2020-05-31 MED ORDER — SODIUM CHLORIDE 0.9 % WEIGHT BASED INFUSION: 1.0000 mL/kg/h | INTRAVENOUS | Status: DC

## 2020-05-31 MED ORDER — CLOPIDOGREL BISULFATE 75 MG PO TABS: 75.0000 mg | ORAL_TABLET | Freq: Every day | ORAL | Status: DC

## 2020-05-31 MED ORDER — FENTANYL CITRATE (PF) 100 MCG/2ML IJ SOLN
INTRAMUSCULAR | Status: DC | PRN
  Administered 2020-05-31: 25 ug via INTRAVENOUS

## 2020-05-31 MED ORDER — HEPARIN SODIUM (PORCINE) 1000 UNIT/ML IJ SOLN
INTRAMUSCULAR | Status: DC | PRN
  Administered 2020-05-31: 6500 [IU] via INTRAVENOUS

## 2020-05-31 MED ORDER — MIDAZOLAM HCL 2 MG/2ML IJ SOLN
INTRAMUSCULAR | Status: AC
  Filled 2020-05-31: qty 2

## 2020-05-31 MED ORDER — LIDOCAINE HCL (PF) 1 % IJ SOLN
INTRAMUSCULAR | Status: AC
  Filled 2020-05-31: qty 30

## 2020-05-31 SURGICAL SUPPLY — 12 items
CATH INFINITI 5 FR JL3.5 (CATHETERS) ×1 IMPLANT
CATH INFINITI 5FR JL4 (CATHETERS) ×1 IMPLANT
CATH INFINITI JR4 5F (CATHETERS) ×1 IMPLANT
CATH OPTITORQUE TIG 4.0 5F (CATHETERS) ×1 IMPLANT
DEVICE RAD COMP TR BAND LRG (VASCULAR PRODUCTS) ×1 IMPLANT
GLIDESHEATH SLEND SS 6F .021 (SHEATH) ×1 IMPLANT
GUIDEWIRE INQWIRE 1.5J.035X260 (WIRE) IMPLANT
INQWIRE 1.5J .035X260CM (WIRE) ×2
KIT HEART LEFT (KITS) ×2 IMPLANT
PACK CARDIAC CATHETERIZATION (CUSTOM PROCEDURE TRAY) ×2 IMPLANT
TRANSDUCER W/STOPCOCK (MISCELLANEOUS) ×2 IMPLANT
TUBING CIL FLEX 10 FLL-RA (TUBING) ×2 IMPLANT

## 2020-05-31 NOTE — Interval H&P Note (Signed)
Cath Lab Visit (complete for each Cath Lab visit)  Clinical Evaluation Leading to the Procedure:   ACS: No.  Non-ACS:    Anginal Classification: CCS III  Anti-ischemic medical therapy: Maximal Therapy (2 or more classes of medications)  Non-Invasive Test Results: No non-invasive testing performed  Prior CABG: No previous CABG      History and Physical Interval Note:  05/31/2020 7:32 AM  Brad Woods  has presented today for surgery, with the diagnosis of Unstable angina.  The various methods of treatment have been discussed with the patient and family. After consideration of risks, benefits and other options for treatment, the patient has consented to  Procedure(s): LEFT HEART CATH AND CORONARY ANGIOGRAPHY (N/A) as a surgical intervention.  The patient's history has been reviewed, patient examined, no change in status, stable for surgery.  I have reviewed the patient's chart and labs.  Questions were answered to the patient's satisfaction.     Shelva Majestic

## 2020-05-31 NOTE — Discharge Instructions (Signed)
Radial Site Care  This sheet gives you information about how to care for yourself after your procedure. Your health care provider may also give you more specific instructions. If you have problems or questions, contact your health care provider. What can I expect after the procedure? After the procedure, it is common to have:  Bruising and tenderness at the catheter insertion area. Follow these instructions at home: Medicines  Take over-the-counter and prescription medicines only as told by your health care provider. Insertion site care  Follow instructions from your health care provider about how to take care of your insertion site. Make sure you: ? Wash your hands with soap and water before you change your bandage (dressing). If soap and water are not available, use hand sanitizer. ? Change your dressing as told by your health care provider. ? Leave stitches (sutures), skin glue, or adhesive strips in place. These skin closures may need to stay in place for 2 weeks or longer. If adhesive strip edges start to loosen and curl up, you may trim the loose edges. Do not remove adhesive strips completely unless your health care provider tells you to do that.  Check your insertion site every day for signs of infection. Check for: ? Redness, swelling, or pain. ? Fluid or blood. ? Pus or a bad smell. ? Warmth.  Do not take baths, swim, or use a hot tub until your health care provider approves.  You may shower 24-48 hours after the procedure, or as directed by your health care provider. ? Remove the dressing and gently wash the site with plain soap and water. ? Pat the area dry with a clean towel. ? Do not rub the site. That could cause bleeding.  Do not apply powder or lotion to the site. Activity   For 24 hours after the procedure, or as directed by your health care provider: ? Do not flex or bend the affected arm. ? Do not push or pull heavy objects with the affected arm. ? Do not  drive yourself home from the hospital or clinic. You may drive 24 hours after the procedure unless your health care provider tells you not to. ? Do not operate machinery or power tools.  Do not lift anything that is heavier than 10 lb (4.5 kg), or the limit that you are told, until your health care provider says that it is safe.  Ask your health care provider when it is okay to: ? Return to work or school. ? Resume usual physical activities or sports. ? Resume sexual activity. General instructions  If the catheter site starts to bleed, raise your arm and put firm pressure on the site. If the bleeding does not stop, get help right away. This is a medical emergency.  If you went home on the same day as your procedure, a responsible adult should be with you for the first 24 hours after you arrive home.  Keep all follow-up visits as told by your health care provider. This is important. Contact a health care provider if:  You have a fever.  You have redness, swelling, or yellow drainage around your insertion site. Get help right away if:  You have unusual pain at the radial site.  The catheter insertion area swells very fast.  The insertion area is bleeding, and the bleeding does not stop when you hold steady pressure on the area.  Your arm or hand becomes pale, cool, tingly, or numb. These symptoms may represent a serious problem   that is an emergency. Do not wait to see if the symptoms will go away. Get medical help right away. Call your local emergency services (911 in the [place] Do not drive yourself to the hospital. Summary  After the procedure, it is common to have bruising and tenderness at the site.  Follow instructions from your health care provider about how to take care of your radial site wound. Check the wound every day for signs of infection.  Do not lift anything that is heavier than 10 lb (4.5 kg), or the limit that you are told, until your health care provider says  that it is safe. This information is not intended to replace advice given to you by your health care provider. Make sure you discuss any questions you have with your health care provider. Document Revised: 01/15/2018 Document Reviewed: 01/15/2018 Elsevier Patient Education  2020 Elsevier Inc.  

## 2020-06-01 ENCOUNTER — Telehealth: Payer: Self-pay | Admitting: Cardiovascular Disease

## 2020-06-01 NOTE — Telephone Encounter (Signed)
I spoke to the patient and gave him Dr Elmarie Shiley recommendations post cath.  He has a f/u with Dr Acie Fredrickson on 7/2 and will further discuss Echo as recommended by Dr Claiborne Billings.  The patient verbalized understanding.

## 2020-06-01 NOTE — Telephone Encounter (Signed)
The cath shows no significant changes from his previous cath and normal LV systolic function The previous echo shows that he has known diastolic CHF so we would anticipate that a current echo would also show diastolic dysfunction.  Dr. Claiborne Billings was being very complete in his recommendations and therefore mentioned repeating the echo to make sure we knew all we could about the heart but the recommendation is not urgent.  I would stress weight loss,   using CPAP.   Aggressive BP control He may get the echo once he returns from [place]. Have him see [place] at the next open slot in several months .

## 2020-06-01 NOTE — Telephone Encounter (Signed)
I spoke with the patient who had a heart cath by Dr Claiborne Billings on 6/8.  The patient read the results on My Chart and saw that an Echo was recommended.  He said that he is leaving for [facility] on 6/15 and will be gone for 2 weeks and was wondering how soon this needs to be done.

## 2020-06-01 NOTE — Telephone Encounter (Signed)
New Message  Pt is calling and would like to speak with a nurse asap concerning his cath results. Please call back

## 2020-06-02 ENCOUNTER — Ambulatory Visit: Payer: PPO | Admitting: Physician Assistant

## 2020-06-15 ENCOUNTER — Ambulatory Visit: Payer: PPO | Admitting: Cardiovascular Disease

## 2020-06-22 ENCOUNTER — Emergency Department (HOSPITAL_COMMUNITY): Payer: PPO

## 2020-06-22 ENCOUNTER — Observation Stay (HOSPITAL_COMMUNITY)
Admission: EM | Admit: 2020-06-22 | Discharge: 2020-06-23 | Disposition: A | Discharge status: Home / Self Care | Payer: PPO | Attending: Internal Medicine | Admitting: Internal Medicine

## 2020-06-22 ENCOUNTER — Other Ambulatory Visit: Payer: Self-pay

## 2020-06-22 ENCOUNTER — Encounter (HOSPITAL_COMMUNITY): Payer: Self-pay

## 2020-06-22 DIAGNOSIS — R42 Dizziness and giddiness: Principal | ICD-10-CM

## 2020-06-22 DIAGNOSIS — N529 Male erectile dysfunction, unspecified: Secondary | ICD-10-CM | POA: Diagnosis not present

## 2020-06-22 DIAGNOSIS — Z79899 Other long term (current) drug therapy: Secondary | ICD-10-CM | POA: Diagnosis not present

## 2020-06-22 DIAGNOSIS — Z03818 Encounter for observation for suspected exposure to other biological agents ruled out: Secondary | ICD-10-CM | POA: Diagnosis not present

## 2020-06-22 DIAGNOSIS — I6782 Cerebral ischemia: Secondary | ICD-10-CM | POA: Diagnosis not present

## 2020-06-22 DIAGNOSIS — Z7902 Long term (current) use of antithrombotics/antiplatelets: Secondary | ICD-10-CM | POA: Insufficient documentation

## 2020-06-22 DIAGNOSIS — Z96653 Presence of artificial knee joint, bilateral: Secondary | ICD-10-CM

## 2020-06-22 DIAGNOSIS — G8929 Other chronic pain: Secondary | ICD-10-CM

## 2020-06-22 DIAGNOSIS — G4733 Obstructive sleep apnea (adult) (pediatric): Secondary | ICD-10-CM | POA: Diagnosis not present

## 2020-06-22 DIAGNOSIS — I2510 Atherosclerotic heart disease of native coronary artery without angina pectoris: Secondary | ICD-10-CM | POA: Insufficient documentation

## 2020-06-22 DIAGNOSIS — M1990 Unspecified osteoarthritis, unspecified site: Secondary | ICD-10-CM | POA: Diagnosis not present

## 2020-06-22 DIAGNOSIS — G43909 Migraine, unspecified, not intractable, without status migrainosus: Secondary | ICD-10-CM | POA: Diagnosis not present

## 2020-06-22 DIAGNOSIS — I110 Hypertensive heart disease with heart failure: Secondary | ICD-10-CM | POA: Insufficient documentation

## 2020-06-22 DIAGNOSIS — Z7982 Long term (current) use of aspirin: Secondary | ICD-10-CM | POA: Diagnosis not present

## 2020-06-22 DIAGNOSIS — I252 Old myocardial infarction: Secondary | ICD-10-CM | POA: Diagnosis not present

## 2020-06-22 DIAGNOSIS — R0902 Hypoxemia: Secondary | ICD-10-CM | POA: Diagnosis not present

## 2020-06-22 DIAGNOSIS — Z955 Presence of coronary angioplasty implant and graft: Secondary | ICD-10-CM | POA: Insufficient documentation

## 2020-06-22 DIAGNOSIS — I25119 Atherosclerotic heart disease of native coronary artery with unspecified angina pectoris: Secondary | ICD-10-CM

## 2020-06-22 DIAGNOSIS — I5030 Unspecified diastolic (congestive) heart failure: Secondary | ICD-10-CM | POA: Insufficient documentation

## 2020-06-22 DIAGNOSIS — E782 Mixed hyperlipidemia: Secondary | ICD-10-CM | POA: Insufficient documentation

## 2020-06-22 DIAGNOSIS — K219 Gastro-esophageal reflux disease without esophagitis: Secondary | ICD-10-CM | POA: Diagnosis not present

## 2020-06-22 DIAGNOSIS — G319 Degenerative disease of nervous system, unspecified: Secondary | ICD-10-CM | POA: Diagnosis not present

## 2020-06-22 DIAGNOSIS — Z20822 Contact with and (suspected) exposure to covid-19: Secondary | ICD-10-CM | POA: Diagnosis not present

## 2020-06-22 DIAGNOSIS — I4510 Unspecified right bundle-branch block: Secondary | ICD-10-CM | POA: Insufficient documentation

## 2020-06-22 DIAGNOSIS — I10 Essential (primary) hypertension: Secondary | ICD-10-CM | POA: Diagnosis not present

## 2020-06-22 DIAGNOSIS — E039 Hypothyroidism, unspecified: Secondary | ICD-10-CM | POA: Insufficient documentation

## 2020-06-22 DIAGNOSIS — G9389 Other specified disorders of brain: Secondary | ICD-10-CM | POA: Diagnosis not present

## 2020-06-22 DIAGNOSIS — M25562 Pain in left knee: Secondary | ICD-10-CM

## 2020-06-22 DIAGNOSIS — I739 Peripheral vascular disease, unspecified: Secondary | ICD-10-CM

## 2020-06-22 DIAGNOSIS — R0609 Other forms of dyspnea: Secondary | ICD-10-CM

## 2020-06-22 LAB — CBC
HCT: 46.8 % (ref 39.0–52.0)
Hemoglobin: 15.6 g/dL (ref 13.0–17.0)
MCH: 29.7 pg (ref 26.0–34.0)
MCHC: 33.3 g/dL (ref 30.0–36.0)
MCV: 89.1 fL (ref 80.0–100.0)
Platelets: 146 10*3/uL — ABNORMAL LOW (ref 150–400)
RBC: 5.25 MIL/uL (ref 4.22–5.81)
RDW: 13.7 % (ref 11.5–15.5)
WBC: 5.7 10*3/uL (ref 4.0–10.5)
nRBC: 0 % (ref 0.0–0.2)

## 2020-06-22 LAB — BASIC METABOLIC PANEL
Anion gap: 11 (ref 5–15)
BUN: 17 mg/dL (ref 8–23)
CO2: 23 mmol/L (ref 22–32)
Calcium: 9.1 mg/dL (ref 8.9–10.3)
Chloride: 105 mmol/L (ref 98–111)
Creatinine, Ser: 1.01 mg/dL (ref 0.61–1.24)
GFR calc Af Amer: >60 mL/min (ref >60)
GFR calc non Af Amer: >60 mL/min (ref >60)
Glucose, Bld: 116 mg/dL — ABNORMAL HIGH (ref 70–99)
Potassium: 4 mmol/L (ref 3.5–5.1)
Sodium: 139 mmol/L (ref 135–145)

## 2020-06-22 LAB — SARS CORONAVIRUS 2 BY RT PCR (HOSPITAL ORDER, PERFORMED IN ~~LOC~~ HOSPITAL LAB): SARS Coronavirus 2: NEGATIVE

## 2020-06-22 LAB — TROPONIN I (HIGH SENSITIVITY)
Troponin I (High Sensitivity): 5 ng/L (ref <18)
Troponin I (High Sensitivity): 3 ng/L (ref <18)

## 2020-06-22 MED ORDER — FINASTERIDE 5 MG PO TABS
5.0000 mg | ORAL_TABLET | Freq: Every day | ORAL | Status: DC
  Administered 2020-06-23: 5 mg via ORAL
  Filled 2020-06-22: qty 1

## 2020-06-22 MED ORDER — TAMSULOSIN HCL 0.4 MG PO CAPS
0.4000 mg | ORAL_CAPSULE | Freq: Every day | ORAL | Status: DC
  Administered 2020-06-23: 0.4 mg via ORAL
  Filled 2020-06-22: qty 1

## 2020-06-22 MED ORDER — ISOSORBIDE MONONITRATE ER 60 MG PO TB24
60.0000 mg | ORAL_TABLET | Freq: Every day | ORAL | Status: DC
  Administered 2020-06-23: 60 mg via ORAL
  Filled 2020-06-22: qty 1

## 2020-06-22 MED ORDER — ALBUTEROL SULFATE (2.5 MG/3ML) 0.083% IN NEBU: 3.0000 mL | INHALATION_SOLUTION | Freq: Four times a day (QID) | RESPIRATORY_TRACT | Status: DC | PRN

## 2020-06-22 MED ORDER — CLOPIDOGREL BISULFATE 75 MG PO TABS
75.0000 mg | ORAL_TABLET | Freq: Every day | ORAL | Status: DC
  Administered 2020-06-23: 75 mg via ORAL
  Filled 2020-06-22: qty 1

## 2020-06-22 MED ORDER — ONDANSETRON HCL 4 MG/2ML IJ SOLN: 4.0000 mg | Freq: Four times a day (QID) | INTRAMUSCULAR | Status: DC | PRN

## 2020-06-22 MED ORDER — MECLIZINE HCL 25 MG PO TABS
25.0000 mg | ORAL_TABLET | Freq: Three times a day (TID) | ORAL | 0 refills | Status: DC | PRN
Start: 2020-06-22 — End: 2023-07-09

## 2020-06-22 MED ORDER — SODIUM CHLORIDE 0.9% FLUSH: 3.0000 mL | Freq: Once | INTRAVENOUS | Status: DC

## 2020-06-22 MED ORDER — ONDANSETRON HCL 4 MG/2ML IJ SOLN
4.0000 mg | Freq: Once | INTRAMUSCULAR | Status: AC
  Administered 2020-06-22: 4 mg via INTRAVENOUS
  Filled 2020-06-22: qty 2

## 2020-06-22 MED ORDER — MECLIZINE HCL 25 MG PO TABS
25.0000 mg | ORAL_TABLET | Freq: Once | ORAL | Status: AC
  Administered 2020-06-22: 25 mg via ORAL
  Filled 2020-06-22: qty 1

## 2020-06-22 MED ORDER — ONDANSETRON HCL 4 MG PO TABS: 4.0000 mg | ORAL_TABLET | Freq: Four times a day (QID) | ORAL | Status: DC | PRN

## 2020-06-22 MED ORDER — LORAZEPAM 2 MG/ML IJ SOLN
1.0000 mg | Freq: Once | INTRAMUSCULAR | Status: AC
  Administered 2020-06-22: 1 mg via INTRAVENOUS
  Filled 2020-06-22: qty 1

## 2020-06-22 MED ORDER — NITROGLYCERIN 0.4 MG SL SUBL: 0.4000 mg | SUBLINGUAL_TABLET | SUBLINGUAL | Status: DC | PRN

## 2020-06-22 MED ORDER — CARVEDILOL 12.5 MG PO TABS
12.5000 mg | ORAL_TABLET | Freq: Two times a day (BID) | ORAL | Status: DC
  Administered 2020-06-23 (×2): 12.5 mg via ORAL
  Filled 2020-06-22 (×2): qty 1

## 2020-06-22 MED ORDER — ENOXAPARIN SODIUM 40 MG/0.4ML ~~LOC~~ SOLN
40.0000 mg | SUBCUTANEOUS | Status: DC
  Administered 2020-06-23: 40 mg via SUBCUTANEOUS
  Filled 2020-06-22: qty 0.4

## 2020-06-22 MED ORDER — ASPIRIN EC 81 MG PO TBEC
81.0000 mg | DELAYED_RELEASE_TABLET | Freq: Every day | ORAL | Status: DC
  Administered 2020-06-23: 81 mg via ORAL
  Filled 2020-06-22: qty 1

## 2020-06-22 NOTE — ED Provider Notes (Signed)
[facility] EMERGENCY DEPARTMENT Provider Note   CSN: [id number] Arrival date & time: 06/22/20  9242     History Chief Complaint  Patient presents with  . Chest Pain    Brad Woods is a 74 y.o. male.  HPI      Presents with chest pain and dizziness Woke up and couldn't get out of bed due to dizziness 190s blood pressure Was here 2 weeks ago 05/31/2020 had catheterization due to shortness of breath, check on stent--had CAD seen, medical therapy recommended. Have appointment with Dr. Acie Fredrickson on Friday, wanted to have ECHO  Vertigo sensation of dizziness, was severe this AM, fell at home, no head trauma Now mild headache after sittin in the waiting room.  No syncope, but did have feeling of lightheadedness, near syncope along with room spinning sensation.  Dizziness worse with standing, moving head, better with rest/laying down.  Nausea no vomiting. Had vertigo like this 20 yrs ago but not anything more recently  No numbness/weakness/difficulty talking or acute changes in vision.  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  34yr ago was in ICU for 4-5 days with head injury and had dizziness, head injury vomiting  Reports not really chest pain, more dizziness Had left shoulder pain-gets it off and on all the time, has had shots of cortisone in it Had chest discomfort that felt more like reflux, did not feel like past heart attack Did take nitroglycerin, didn't affect the discomfort in chest or shoulder, feels these symptoms related to shoulder pain andd reflux (burps and it improves)-has been more ongoign not acute or new symptoms with the dizziness today. Dyspnea for months, no acute change today (had cath due to this) Past Medical History:  Diagnosis Date  . Allergy   . Arthritis   . Asthma    when younger  . GERD (gastroesophageal reflux disease)   . Headache   . Hyperlipidemia   . Hypertension   . Kidney stones   . Myocardial infarction  ([place])   . Sleep apnea    cpap    Patient Active Problem List   Diagnosis Date Noted  . Dizziness 06/22/2020  . Exertional dyspnea   . Chronic pain of left knee 09/02/2018  . Chronic pain of right knee 09/02/2018  . History of bilateral knee replacement 09/02/2018  . Allergic rhinitis 08/15/2018  . Erectile dysfunction 08/15/2018  . History of renal stone 08/15/2018  . Migraine 08/15/2018  . Osteoarthritis 08/15/2018  . Hyperlipidemia 08/15/2018  . PVD (peripheral vascular disease) ([place]) 08/15/2018  . Left shoulder pain 07/30/2018  . Chest pain 07/30/2018  . Trochanteric bursitis, left hip 05/02/2017  . Carpal tunnel syndrome, left upper limb 05/02/2017  . Trochanteric bursitis, right hip 03/18/2017  . Mixed hyperlipidemia 12/31/2016  . Coronary artery disease involving native coronary artery of native heart with angina pectoris ([place]) 11/25/2016  . Coronary artery disease involving native coronary artery of native heart without angina pectoris 11/20/2016  . Elevated troponin   . Unstable angina ([place]) 05/18/2016  . Acute coronary syndrome ([place]) 05/18/2016  . Benign non-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 10/12/2014  . Essential hypertension 10/12/2014  . 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 10/12/2014  . High risk medication use 10/12/2014  . IFG (impaired fasting glucose) 10/12/2014  . OSA (obstructive sleep apnea) 02/04/2014  . Hypothyroidism 03/02/2013  . GERD (gastroesophageal reflux disease) 09/04/2012    Past Surgical History:  Procedure Laterality Date  . CARDIAC CATHETERIZATION N/A 05/20/2016  Procedure: Left Heart Cath and Coronary Angiography;  Surgeon: Lorretta Harp, MD;  Location: [facility];  Service: Cardiovascular;  Laterality: N/A;  . CARDIAC CATHETERIZATION N/A 11/22/2016   Procedure: Left Heart Cath and Coronary Angiography;  Surgeon: Nelva Bush, MD;  Location: [place];  Service: Cardiovascular;  Laterality: N/A;  .  COLONOSCOPY    . EYE SURGERY    . LEFT HEART CATH AND CORONARY ANGIOGRAPHY N/A 05/31/2020   Procedure: LEFT HEART CATH AND CORONARY ANGIOGRAPHY;  Surgeon: Troy Sine, MD;  Location: [facility];  Service: Cardiovascular;  Laterality: N/A;  . RECTAL SURGERY    . REPLACEMENT TOTAL KNEE Bilateral        Family History  Problem Relation Age of Onset  . Colon cancer Mother 17    Social History   Tobacco Use  . Smoking status: Never Smoker  . Smokeless tobacco: Never Used  Vaping Use  . Vaping Use: Never used  Substance Use Topics  . Alcohol use: Yes    Alcohol/week: 0.0 standard drinks    Comment: occasionally  . Drug use: No    Home Medications Prior to Admission medications   Medication Sig Start Date End Date Taking? Authorizing Provider  albuterol (PROVENTIL HFA;VENTOLIN HFA) 108 (90 Base) MCG/ACT inhaler Inhale 2 puffs into the lungs every 6 (six) hours as needed for wheezing or shortness of breath. 11/20/16  Yes Nahser, Wonda Cheng, MD  aspirin EC 81 MG EC tablet Take 1 tablet (81 mg total) by mouth daily. 05/22/16  Yes Dixie Dials, MD  carvedilol (COREG) 12.5 MG tablet Take 1 tablet (12.5 mg total) by mouth 2 (two) times daily. 05/26/20 08/24/20 Yes Bhagat, Bhavinkumar, PA  clopidogrel (PLAVIX) 75 MG tablet TAKE 1 TABLET BY MOUTH EVERY DAY Patient taking differently: Take 75 mg by mouth daily.  04/19/20  Yes Nahser, Wonda Cheng, MD  finasteride (PROSCAR) 5 MG tablet Take 5 mg by mouth daily.   Yes [provider]  fluticasone (FLONASE) 50 MCG/ACT nasal spray Place 2 sprays into the nose daily as needed for allergies.  12/07/13  Yes [provider]  ibuprofen (ADVIL) 200 MG tablet Take 400 mg by mouth every 6 (six) hours as needed for mild pain or moderate pain.   Yes [provider]  isosorbide mononitrate (IMDUR) 60 MG 24 hr tablet TAKE 1 TABLET BY MOUTH EVERY DAY Patient taking differently: Take 60 mg by mouth daily.  06/01/20  Yes Nahser, Wonda Cheng, MD  Multiple Vitamins-Minerals (CENTRUM SILVER 50+MEN PO) Take 1 tablet by mouth every other day.    Yes [provider]  nitroGLYCERIN (NITROSTAT) 0.4 MG SL tablet PLACE 1 TABLET (0.4 MG TOTAL) UNDER THE TONGUE EVERY 5 (FIVE) MINUTES AS NEEDED FOR CHEST PAIN. 08/04/18  Yes Nahser, Wonda Cheng, MD  REPATHA SURECLICK 245 MG/ML SOAJ INJECT 1 PEN INTO THE SKIN EVERY 14 (FOURTEEN) DAYS. Patient taking differently: Inject 140 mg as directed every 14 (fourteen) days.  02/29/20  Yes Nahser, Wonda Cheng, MD  tamsulosin (FLOMAX) 0.4 MG CAPS capsule Take 0.4 mg by mouth at bedtime.  08/14/16  Yes [provider]  meclizine (ANTIVERT) 25 MG tablet Take 1 tablet (25 mg total) by mouth 3 (three) times daily as needed for dizziness. 06/22/20   Gareth Morgan, MD    Allergies    Hydromorphone hcl, Hydromorphone, and Aleve [naproxen sodium]  Review of Systems   Review of Systems  Constitutional: Negative for fever.  HENT: Negative for sore throat.   Eyes: Negative for visual disturbance.  Respiratory: Negative for shortness of breath.   Cardiovascular: Negative for chest pain.  Gastrointestinal: Positive for nausea. Negative for abdominal pain, diarrhea and vomiting.  Genitourinary: Negative for difficulty urinating.  Musculoskeletal: Negative for back pain and neck stiffness.  Skin: Negative for rash.  Neurological: Positive for dizziness and light-headedness. Negative for syncope, speech difficulty, weakness, numbness and headaches.    Physical Exam Updated Vital Signs BP (!) 161/73   Pulse 64   Temp 98 F (36.7 C) (Oral)   Resp 17   Ht 6\' 2"  (1.88 m)   Wt (!) 136.1 kg   SpO2 96%   BMI 38.52 kg/m   Physical Exam Vitals and nursing note reviewed.  Constitutional:      General: He is not in acute distress.    Appearance: He is well-developed. He is not diaphoretic.  HENT:     Head: Normocephalic and atraumatic.  Eyes:     General: No visual field deficit.     Conjunctiva/sclera: Conjunctivae normal.  Cardiovascular:     Rate and Rhythm: Normal rate and regular rhythm.     Heart sounds: Normal heart sounds. No murmur heard.  No friction rub. No gallop.   Pulmonary:     Effort: Pulmonary effort is normal. No respiratory distress.     Breath sounds: Normal breath sounds. No wheezing or rales.  Abdominal:     General: There is no distension.     Palpations: Abdomen is soft.     Tenderness: There is no abdominal tenderness. There is no guarding.  Musculoskeletal:     Cervical back: Normal range of motion.  Skin:    General: Skin is warm and dry.  Neurological:     Mental Status: He is alert and oriented to person, place, and time.     GCS: GCS eye subscore is 4. GCS verbal subscore is 5. GCS motor subscore is 6.     Cranial Nerves: Cranial nerves are intact. No cranial nerve deficit, dysarthria or facial asymmetry.     Sensory: Sensation is intact. No sensory deficit.     Motor: Motor function is intact.     Coordination: Coordination normal.     Gait: Abnormal gait: gait deferred.     ED Results / Procedures / Treatments   Labs (all labs ordered are listed, but only abnormal results are displayed) Labs Reviewed  BASIC METABOLIC PANEL - Abnormal; Notable for the following components:      Result Value   Glucose, Bld 116 (*)    All other components within normal limits  CBC - Abnormal; Notable for the following components:   Platelets 146 (*)    All other components within normal limits  SARS CORONAVIRUS 2 BY RT PCR (HOSPITAL ORDER, [facility])  TROPONIN I (HIGH SENSITIVITY)  TROPONIN I (HIGH SENSITIVITY)  TROPONIN I (HIGH SENSITIVITY)    EKG EKG Interpretation  Date/Time:  Wednesday June 22 2020 06:24:35 EDT Ventricular Rate:  70 PR Interval:  178 QRS Duration: 148 QT Interval:  466 QTC Calculation: 503 R Axis:   -29 Text Interpretation: Normal sinus rhythm Right bundle branch block Possible  Lateral infarct , age undetermined Inferior infarct , age undetermined Abnormal ECG No significant change since last tracing Confirmed by Gareth Morgan [phone]) on 06/22/2020 9:29:46 PM   Radiology DG Chest 2 View  Result Date: 06/22/2020 CLINICAL DATA:  Dizziness and sweating EXAM: CHEST -  2 VIEW COMPARISON:  07/29/2018 FINDINGS: Prominent left ventricular heart size. Aortic tortuosity that appears similar. There is no edema, consolidation, effusion, or pneumothorax. Borderline vascular congestion with cephalized blood flow but not convincingly changed on the frontal view. Remote left fifth rib fracture. Spondylosis IMPRESSION: No acute finding when compared with prior. Electronically Signed   By: Monte Fantasia M.D.   On: 06/22/2020 07:15   CT Head Wo Contrast  Result Date: 06/22/2020 CLINICAL DATA:  Vertigo EXAM: CT HEAD WITHOUT CONTRAST TECHNIQUE: Contiguous axial images were obtained from the base of the skull through the vertex without intravenous contrast. COMPARISON:  MRI 12/11/2005 FINDINGS: Brain: No acute territorial infarction, hemorrhage or intracranial mass. Mild to moderate atrophy. Slightly prominent ventricles felt secondary to atrophy. Vascular: No hyperdense vessels. Scattered carotid vascular calcification Skull: Normal. Negative for fracture or focal lesion. Sinuses/Orbits: No acute finding. Other: None IMPRESSION: 1. No CT evidence for acute intracranial abnormality. 2. Atrophy. Electronically Signed   By: Donavan Foil M.D.   On: 06/22/2020 16:06   MR BRAIN WO CONTRAST  Result Date: 06/22/2020 CLINICAL DATA:  Central vertigo EXAM: MRI HEAD WITHOUT CONTRAST TECHNIQUE: Multiplanar, multiecho pulse sequences of the brain and surrounding structures were obtained without intravenous contrast. COMPARISON:  CT head 06/22/2020 FINDINGS: Brain: Generalized atrophy with mild ventricular enlargement. Patchy white matter hyperintensities bilaterally compatible with chronic ischemia.  Negative for acute infarct, hemorrhage, mass Vascular: Normal arterial flow voids. Left vertebral artery dominant. Skull and upper cervical spine: No focal skeletal lesion. Sinuses/Orbits: Paranasal sinuses clear.  Negative orbit Other: None IMPRESSION: Atrophy and mild chronic microvascular ischemic change. No acute infarct. Electronically Signed   By: Franchot Gallo M.D.   On: 06/22/2020 17:26    Procedures Procedures (including critical care time)  Medications Ordered in ED Medications  sodium chloride flush (NS) 0.9 % injection 3 mL (3 mLs Intravenous Not Given 06/22/20 1330)  meclizine (ANTIVERT) tablet 25 mg (25 mg Oral Given 06/22/20 1455)  LORazepam (ATIVAN) injection 1 mg (1 mg Intravenous Given 06/22/20 1647)  ondansetron (ZOFRAN) injection 4 mg (4 mg Intravenous Given 06/22/20 1459)    ED Course  I have reviewed the triage vital signs and the nursing notes.  Pertinent labs & imaging results that were available during my care of the patient were reviewed by me and considered in my medical decision making (see chart for details).    MDM Rules/Calculators/A&P                          74 year old male with a history of coronary artery disease, hyperlipidemia, peripheral vascular disease, hypertension presents with concern for dizziness.  He had also reported chest pain and shoulder pain to EMS, but clarifies on my evaluation that this shoulder pain is musculoskeletal and ongoing for which she is receives cortisone shots, and the discomfort he experienced in his chest is similar to his reflux and improved with belching.  He denies the chest pain feeling similar to prior MI, and reports that the symptoms are more chronic and not associated with the reason he came to the emergency department today.  Reports he presented today due to acute onset vertigo upon waking.  No acute dyspnea or chest pain and have low suspicion for pulmonary embolus or ACS.  In addition, he has 2 troponins which are  negative.  No significant anemia or electrolyte abnormalities.  Denies palpitations, and given symptoms are ongoing sensation of vertigo worse with movements, do not  feel his history is consistent with cardiac arrhythmia.  Differential diagnosis for dizziness includes central causes such as stroke, intracranial bleed, mass and peripheral causes such as BPPV, meniere's disease, viral.  By history, dizziness may be peripheral, however he is unable to walk at this time, and given his significant risk factors for CVA, will obtain MRI of the brain for further evaluation.  Plan to evaluate with MRI and ambulate after receiving meclizine to determine disposition.        Final Clinical Impression(s) / ED Diagnoses Final diagnoses:  Dizziness    Rx / DC Orders ED Discharge Orders         Ordered    meclizine (ANTIVERT) 25 MG tablet  3 times daily PRN     Discontinue  Reprint     06/22/20 1740           Gareth Morgan, MD 06/22/20 2131

## 2020-06-22 NOTE — ED Notes (Signed)
Pt ambulated in the hall with difficulty. Pt was not stable when ambulating.Pt reports dizziness.

## 2020-06-22 NOTE — ED Provider Notes (Signed)
5:00 PM-checkout from Dr. Billy Fischer to evaluate after MRI returns and see if he can ambulate.  7:45 PM-patient was able ambulate only to the doorway and then had to go back to the bed because of dizziness and feeling like he is "off balance." Additional history from patient and wife at this time indicate that he has had daily episodes of dizziness described as a spinning sensation associated with being off balance, for 1 month. During this time he is also had dyspnea on exertion, and saw his cardiologist 2 weeks ago. At that time he was sent for cardiac catheterization which was done and found stable chronic coronary disease, treated medically. Recommendation was made for 2D echocardiogram at that time. Patient plans on seeing his cardiologist, in 2 days, and expects to have an MRI ordered. At this time the patient states that he feels like he cannot manage himself at home, therefore would like to be admitted to the hospital for management of his dizziness with shortness of breath.  7:59 PM-Consult complete with hospitalist. Patient case explained and discussed.  He agrees to admit patient for further evaluation and treatment. Call ended at 8:10 PM   Daleen Bo, MD 06/22/20 2012

## 2020-06-22 NOTE — H&P (Signed)
History and Physical    GUTHRIE CRISP YQI:[id number] DOB: 04-Feb-1946 DOA: 06/22/2020  PCP: Joycelyn Rua, MD  Patient coming from: Home.  Chief Complaint: Dizziness.  HPI: Brad Woods is a 74 y.o. male with history of CAD status post stenting, hypertension, diastolic CHF sleep apnea who has had recent cardiac cath about 3 weeks ago at that time recommended medical management.  Experiencing dizziness off and on last few days but got acutely worse this morning after waking up.  Patient states when he tried to get up from the bed this morning he felt so dizzy he had to fall back onto the bed.  He did not have any nausea vomiting headache visual symptoms or any focal deficits.  This dizziness continued throughout the day and he decided to come to the ER.  ED Course: In the ER patient was not orthostatic MRI brain was unremarkable EKG showed normal sinus rhythm.  Covid test was negative labs were largely unremarkable except for platelets being around 146 which appears to be new.  Patient was given medications for dizziness despite which patient remained dizzy and found it difficult to walk admitted for further management likely from benign positional vertigo.  Review of Systems: As per HPI, rest all negative.   Past Medical History:  Diagnosis Date  . Allergy   . Arthritis   . Asthma    when younger  . GERD (gastroesophageal reflux disease)   . Headache   . Hyperlipidemia   . Hypertension   . Kidney stones   . Myocardial infarction (HCC)   . Sleep apnea    cpap    Past Surgical History:  Procedure Laterality Date  . CARDIAC CATHETERIZATION N/A 05/20/2016   Procedure: Left Heart Cath and Coronary Angiography;  Surgeon: Runell Gess, MD;  Location: [facility] INVASIVE CV LAB;  Service: Cardiovascular;  Laterality: N/A;  . CARDIAC CATHETERIZATION N/A 11/22/2016   Procedure: Left Heart Cath and Coronary Angiography;  Surgeon: Yvonne Kendall, MD;  Location: [facility] INVASIVE CV LAB;   Service: Cardiovascular;  Laterality: N/A;  . COLONOSCOPY    . EYE SURGERY    . LEFT HEART CATH AND CORONARY ANGIOGRAPHY N/A 05/31/2020   Procedure: LEFT HEART CATH AND CORONARY ANGIOGRAPHY;  Surgeon: Lennette Bihari, MD;  Location: [facility] INVASIVE CV LAB;  Service: Cardiovascular;  Laterality: N/A;  . RECTAL SURGERY    . REPLACEMENT TOTAL KNEE Bilateral      reports that he has never smoked. He has never used smokeless tobacco. He reports current alcohol use. He reports that he does not use drugs.  Allergies  Allergen Reactions  . Hydromorphone Hcl Other (See Comments)    "flatlined" - Patient states he was overdosed. He states he isn't allergic.  Marland Kitchen Hydromorphone     Cardiac arrest  . Aleve [Naproxen Sodium] Dermatitis    States he was given aleve in the hospital and it caused welts.     Family History  Problem Relation Age of Onset  . Colon cancer Mother 77    Prior to Admission medications   Medication Sig Start Date End Date Taking? Authorizing Provider  albuterol (PROVENTIL HFA;VENTOLIN HFA) 108 (90 Base) MCG/ACT inhaler Inhale 2 puffs into the lungs every 6 (six) hours as needed for wheezing or shortness of breath. 11/20/16  Yes Nahser, Deloris Ping, MD  aspirin EC 81 MG EC tablet Take 1 tablet (81 mg total) by mouth daily. 05/22/16  Yes Orpah Cobb, MD  carvedilol (COREG) 12.5 MG  tablet Take 1 tablet (12.5 mg total) by mouth 2 (two) times daily. 05/26/20 08/24/20 Yes Bhagat, Bhavinkumar, PA  clopidogrel (PLAVIX) 75 MG tablet TAKE 1 TABLET BY MOUTH EVERY DAY Patient taking differently: Take 75 mg by mouth daily.  04/19/20  Yes Nahser, Deloris Ping, MD  finasteride (PROSCAR) 5 MG tablet Take 5 mg by mouth daily.   Yes [provider]  fluticasone (FLONASE) 50 MCG/ACT nasal spray Place 2 sprays into the nose daily as needed for allergies.  12/07/13  Yes [provider]  ibuprofen (ADVIL) 200 MG tablet Take 400 mg by mouth every 6 (six) hours as needed for mild pain or moderate  pain.   Yes [provider]  isosorbide mononitrate (IMDUR) 60 MG 24 hr tablet TAKE 1 TABLET BY MOUTH EVERY DAY Patient taking differently: Take 60 mg by mouth daily.  06/01/20  Yes Nahser, Deloris Ping, MD  Multiple Vitamins-Minerals (CENTRUM SILVER 50+MEN PO) Take 1 tablet by mouth every other day.    Yes [provider]  nitroGLYCERIN (NITROSTAT) 0.4 MG SL tablet PLACE 1 TABLET (0.4 MG TOTAL) UNDER THE TONGUE EVERY 5 (FIVE) MINUTES AS NEEDED FOR CHEST PAIN. 08/04/18  Yes Nahser, Deloris Ping, MD  REPATHA SURECLICK 140 MG/ML SOAJ INJECT 1 PEN INTO THE SKIN EVERY 14 (FOURTEEN) DAYS. Patient taking differently: Inject 140 mg as directed every 14 (fourteen) days.  02/29/20  Yes Nahser, Deloris Ping, MD  tamsulosin (FLOMAX) 0.4 MG CAPS capsule Take 0.4 mg by mouth at bedtime.  08/14/16  Yes [provider]  meclizine (ANTIVERT) 25 MG tablet Take 1 tablet (25 mg total) by mouth 3 (three) times daily as needed for dizziness. 06/22/20   Alvira Monday, MD    Physical Exam: Constitutional: Moderately built and nourished. Vitals:   06/22/20 1915 06/22/20 1924 06/22/20 1943 06/22/20 2220  BP: (!) 136/56  (!) 161/73 (!) 153/67  Pulse: 65  64 62  Resp: 16  17 17   Temp:    98.3 F (36.8 C)  TempSrc:    Oral  SpO2:    98%  Weight:  (!) 136.1 kg    Height:  6\' 2"  (1.88 m)     Eyes: Anicteric no pallor. ENMT: No discharge from the ears eyes nose or mouth. Neck: No mass felt.  No neck rigidity. Respiratory: No rhonchi or crepitations. Cardiovascular: S1-S2 heard. Abdomen: Soft nontender bowel sounds present. Musculoskeletal: No edema. Skin: No rash. Neurologic: Alert awake oriented time place and person.  Moves all extremities. Psychiatric: Appears normal.   Labs on Admission: I have personally reviewed following labs and imaging studies  CBC: Recent Labs  Lab 06/22/20 0629  WBC 5.7  HGB 15.6  HCT 46.8  MCV 89.1  PLT 146*   Basic Metabolic Panel: Recent Labs  Lab  06/22/20 0629  NA 139  K 4.0  CL 105  CO2 23  GLUCOSE 116*  BUN 17  CREATININE 1.01  CALCIUM 9.1   GFR: Estimated Creatinine Clearance: 95.6 mL/min (by C-G formula based on SCr of 1.01 mg/dL). Liver Function Tests: No results for input(s): AST, ALT, ALKPHOS, BILITOT, PROT, ALBUMIN in the last 168 hours. No results for input(s): LIPASE, AMYLASE in the last 168 hours. No results for input(s): AMMONIA in the last 168 hours. Coagulation Profile: No results for input(s): INR, PROTIME in the last 168 hours. Cardiac Enzymes: No results for input(s): CKTOTAL, CKMB, CKMBINDEX, TROPONINI in the last 168 hours. BNP (last 3 results) No results for input(s): PROBNP in  the last 8760 hours. HbA1C: No results for input(s): HGBA1C in the last 72 hours. CBG: No results for input(s): GLUCAP in the last 168 hours. Lipid Profile: No results for input(s): CHOL, HDL, LDLCALC, TRIG, CHOLHDL, LDLDIRECT in the last 72 hours. Thyroid Function Tests: No results for input(s): TSH, T4TOTAL, FREET4, T3FREE, THYROIDAB in the last 72 hours. Anemia Panel: No results for input(s): VITAMINB12, FOLATE, FERRITIN, TIBC, IRON, RETICCTPCT in the last 72 hours. Urine analysis: No results found for: COLORURINE, APPEARANCEUR, LABSPEC, PHURINE, GLUCOSEU, HGBUR, BILIRUBINUR, KETONESUR, PROTEINUR, UROBILINOGEN, NITRITE, LEUKOCYTESUR Sepsis Labs: @LABRCNTIP (procalcitonin:4,lacticidven:4) ) Recent Results (from the past 240 hour(s))  SARS Coronavirus 2 by RT PCR (hospital order, performed in [facility] hospital lab) Nasopharyngeal Nasopharyngeal Swab     Status: None   Collection Time: 06/22/20  8:15 PM   Specimen: Nasopharyngeal Swab  Result Value Ref Range Status   SARS Coronavirus 2 NEGATIVE NEGATIVE Final    Comment: (NOTE) SARS-CoV-2 target nucleic acids are NOT DETECTED.  The SARS-CoV-2 RNA is generally detectable in upper and lower respiratory specimens during the acute phase of infection. The  lowest concentration of SARS-CoV-2 viral copies this assay can detect is 250 copies / mL. A negative result does not preclude SARS-CoV-2 infection and should not be used as the sole basis for treatment or other patient management decisions.  A negative result may occur with improper specimen collection / handling, submission of specimen other than nasopharyngeal swab, presence of viral mutation(s) within the areas targeted by this assay, and inadequate number of viral copies (<250 copies / mL). A negative result must be combined with clinical observations, patient history, and epidemiological information.  Fact Sheet for Patients:   BoilerBrush.com.cy  Fact Sheet for Healthcare Providers: https://pope.com/  This test is not yet approved or  cleared by the [place] FDA and has been authorized for detection and/or diagnosis of SARS-CoV-2 by FDA under an Emergency Use Authorization (EUA).  This EUA will remain in effect (meaning this test can be used) for the duration of the COVID-19 declaration under Section 564(b)(1) of the Act, 21 U.S.C. section 360bbb-3(b)(1), unless the authorization is terminated or revoked sooner.  Performed at [facility], [address] [place], [place] [address]      Radiological Exams on Admission: DG Chest 2 View  Result Date: 06/22/2020 CLINICAL DATA:  Dizziness and sweating EXAM: CHEST - 2 VIEW COMPARISON:  07/29/2018 FINDINGS: Prominent left ventricular heart size. Aortic tortuosity that appears similar. There is no edema, consolidation, effusion, or pneumothorax. Borderline vascular congestion with cephalized blood flow but not convincingly changed on the frontal view. Remote left fifth rib fracture. Spondylosis IMPRESSION: No acute finding when compared with prior. Electronically Signed   By: Marnee Spring M.D.   On: 06/22/2020 07:15   CT Head Wo Contrast  Result Date: 06/22/2020 CLINICAL  DATA:  Vertigo EXAM: CT HEAD WITHOUT CONTRAST TECHNIQUE: Contiguous axial images were obtained from the base of the skull through the vertex without intravenous contrast. COMPARISON:  MRI 12/11/2005 FINDINGS: Brain: No acute territorial infarction, hemorrhage or intracranial mass. Mild to moderate atrophy. Slightly prominent ventricles felt secondary to atrophy. Vascular: No hyperdense vessels. Scattered carotid vascular calcification Skull: Normal. Negative for fracture or focal lesion. Sinuses/Orbits: No acute finding. Other: None IMPRESSION: 1. No CT evidence for acute intracranial abnormality. 2. Atrophy. Electronically Signed   By: Jasmine Pang M.D.   On: 06/22/2020 16:06   MR BRAIN WO CONTRAST  Result Date: 06/22/2020 CLINICAL DATA:  Central  vertigo EXAM: MRI HEAD WITHOUT CONTRAST TECHNIQUE: Multiplanar, multiecho pulse sequences of the brain and surrounding structures were obtained without intravenous contrast. COMPARISON:  CT head 06/22/2020 FINDINGS: Brain: Generalized atrophy with mild ventricular enlargement. Patchy white matter hyperintensities bilaterally compatible with chronic ischemia. Negative for acute infarct, hemorrhage, mass Vascular: Normal arterial flow voids. Left vertebral artery dominant. Skull and upper cervical spine: No focal skeletal lesion. Sinuses/Orbits: Paranasal sinuses clear.  Negative orbit Other: None IMPRESSION: Atrophy and mild chronic microvascular ischemic change. No acute infarct. Electronically Signed   By: Marlan Palau M.D.   On: 06/22/2020 17:26    EKG: Independently reviewed.  Normal sinus rhythm.  Assessment/Plan Principal Problem:   Dizziness Active Problems:   Essential hypertension   Hypothyroidism   OSA (obstructive sleep apnea)   Coronary artery disease involving native coronary artery of native heart with angina pectoris (HCC)    1. Dizziness -MRI brain was unremarkable.  Patient was not orthostatic.  Patient symptoms likely from benign  positional vertigo will get physical therapy consult keep patient on as needed Antivert. 2. CAD status post stenting recent cardiac cath was done to 3 weeks ago and recommended medical management for which we will continue patient's antiplatelet agents beta-blockers.  Patient is on Repatha. 3. Hypertension on beta-blockers and Imdur. 4. Hyperlipidemia on Repatha. 5. Sleep apnea on CPAP.   DVT prophylaxis: Lovenox. Code Status: Full code. Family Communication: Discussed with patient. Disposition Plan: Home. Consults called: Physical therapy. Admission status: Observation.   Eduard Clos MD [facility] Pager [phone].  If 7PM-7AM, please contact night-coverage [email or web address] Password [email or web address]  06/22/2020, 11:39 PM

## 2020-06-22 NOTE — ED Triage Notes (Signed)
Pt comes via [facility] for CP and dizziness that woke him up from his sleep with some SOB, radiation to L shoulder. PTA pt took 405mg  ASA and one nitro with relief of CP

## 2020-06-23 DIAGNOSIS — R42 Dizziness and giddiness: Secondary | ICD-10-CM | POA: Diagnosis not present

## 2020-06-23 LAB — CREATININE, SERUM
Creatinine, Ser: 1.03 mg/dL (ref 0.61–1.24)
GFR calc Af Amer: >60 mL/min (ref >60)
GFR calc non Af Amer: >60 mL/min (ref >60)

## 2020-06-23 LAB — BASIC METABOLIC PANEL
Anion gap: 7 (ref 5–15)
BUN: 14 mg/dL (ref 8–23)
CO2: 27 mmol/L (ref 22–32)
Calcium: 8.8 mg/dL — ABNORMAL LOW (ref 8.9–10.3)
Chloride: 104 mmol/L (ref 98–111)
Creatinine, Ser: 1.01 mg/dL (ref 0.61–1.24)
GFR calc Af Amer: >60 mL/min (ref >60)
GFR calc non Af Amer: >60 mL/min (ref >60)
Glucose, Bld: 124 mg/dL — ABNORMAL HIGH (ref 70–99)
Potassium: 3.9 mmol/L (ref 3.5–5.1)
Sodium: 138 mmol/L (ref 135–145)

## 2020-06-23 LAB — CBC
HCT: 44.7 % (ref 39.0–52.0)
Hemoglobin: 14.7 g/dL (ref 13.0–17.0)
MCH: 29.1 pg (ref 26.0–34.0)
MCHC: 32.9 g/dL (ref 30.0–36.0)
MCV: 88.5 fL (ref 80.0–100.0)
Platelets: 141 10*3/uL — ABNORMAL LOW (ref 150–400)
RBC: 5.05 MIL/uL (ref 4.22–5.81)
RDW: 13.6 % (ref 11.5–15.5)
WBC: 5.8 10*3/uL (ref 4.0–10.5)
nRBC: 0 % (ref 0.0–0.2)

## 2020-06-23 LAB — TROPONIN I (HIGH SENSITIVITY): Troponin I (High Sensitivity): 3 ng/L (ref <18)

## 2020-06-23 MED ORDER — MECLIZINE HCL 12.5 MG PO TABS
12.5000 mg | ORAL_TABLET | Freq: Three times a day (TID) | ORAL | Status: DC | PRN
  Filled 2020-06-23: qty 1

## 2020-06-23 MED ORDER — MECLIZINE HCL 12.5 MG PO TABS
12.5000 mg | ORAL_TABLET | Freq: Three times a day (TID) | ORAL | Status: DC
  Administered 2020-06-23: 12.5 mg via ORAL
  Filled 2020-06-23 (×3): qty 1

## 2020-06-23 MED ORDER — ONDANSETRON HCL 4 MG PO TABS: 4.0000 mg | ORAL_TABLET | Freq: Four times a day (QID) | ORAL | 0 refills | Status: DC | PRN

## 2020-06-23 MED ORDER — LORAZEPAM 2 MG/ML IJ SOLN: 0.5000 mg | Freq: Four times a day (QID) | INTRAMUSCULAR | Status: DC | PRN

## 2020-06-23 NOTE — Plan of Care (Signed)
  Problem: Education: Goal: Knowledge of General Education information will improve Description: Including pain rating scale, medication(s)/side effects and non-pharmacologic comfort measures Outcome: Progressing   Problem: Clinical Measurements: Goal: Ability to maintain clinical measurements within normal limits will improve Outcome: Progressing   

## 2020-06-23 NOTE — Evaluation (Signed)
Physical Therapy Evaluation Patient Details Name: Brad Woods MRN: [id number] DOB: [birth year] Today's Date: 06/23/2020   History of Present Illness  Brad Woods is a 74 y.o. male with history of CAD status post stenting, hypertension, diastolic CHF sleep apnea who has had recent cardiac cath about 3 weeks ago at that time recommended medical management.  Experiencing dizziness off and on last few days but got acutely worse this morning after waking up.  Patient states when he tried to get up from the bed this morning he felt so dizzy he had to fall back onto the bed.  Clinical Impression  Pt admitted with above diagnosis. Pt positive for left anterior canal BPPV and PT performed canalith repositioning for pt and pt reports relief of symptoms from 8/10- 3/10 after the treatment.  Pt still slightly unsteady on his feet but overall feels better. Encouraged pt to use RW initially if he d/c's for safety and pt agrees.  OUtpt PT follow up recommended.  Also gave pt Austin Miles exercise as well.    Pt currently with functional limitations due to the deficits listed below (see PT Problem List). Pt will benefit from skilled PT to increase their independence and safety with mobility to allow discharge to the venue listed below.      Follow Up Recommendations Outpatient PT;Supervision - Intermittent (vestibular rehab)    Equipment Recommendations  None recommended by PT    Recommendations for Other Services       Precautions / Restrictions Precautions Precautions: Fall Restrictions Weight Bearing Restrictions: No      Mobility  Bed Mobility Overal bed mobility: Independent                Transfers Overall transfer level: Needs assistance Equipment used: 1 person hand held assist Transfers: Sit to/from Stand;Stand Pivot Transfers Sit to Stand: Min assist;From elevated surface Stand pivot transfers: Min assist       General transfer comment: Pt needed steadying assist due  to just performed canalith repositioning maneuver and pt still slightly dizzy.   Ambulation/Gait                Stairs            Wheelchair Mobility    Modified Rankin (Stroke Patients Only)       Balance                                             Pertinent Vitals/Pain Pain Assessment: No/denies pain    Home Living Family/patient expects to be discharged to:: Private residence Living Arrangements: Spouse/significant other Available Help at Discharge: Family;Available 24 hours/day Type of Home: House Home Access: Stairs to enter Entrance Stairs-Rails: None Entrance Stairs-Number of Steps: 1 Home Layout: One level Home Equipment: Walker - 2 wheels;Bedside commode;Shower seat      Prior Function Level of Independence: Independent         Comments: retired, does maintenance work PT, still Theatre stage manager        Extremity/Trunk Assessment   Upper Extremity Assessment Upper Extremity Assessment: Defer to OT evaluation    Lower Extremity Assessment Lower Extremity Assessment: Overall WFL for tasks assessed    Cervical / Trunk Assessment Cervical / Trunk Assessment: Normal  Communication   Communication: No difficulties  Cognition Arousal/Alertness: Awake/alert Behavior During Therapy: WFL for tasks assessed/performed  Overall Cognitive Status: Within Functional Limits for tasks assessed                                        General Comments General comments (skin integrity, edema, etc.): Pt positive for left anterior canal BPPV and performed canalith repositioning with pt dizziness 8/10 initially to 3-4/10 after treatment.      Exercises Other Exercises Other Exercises: brandt daroff exercise given to pt - [id number]   Assessment/Plan    PT Assessment Patient needs continued PT services  PT Problem List Decreased balance;Decreased mobility;Decreased activity tolerance;Decreased knowledge of  use of DME;Decreased safety awareness;Decreased knowledge of precautions;Cardiopulmonary status limiting activity (dizziness)       PT Treatment Interventions DME instruction;Gait training;Functional mobility training;Therapeutic activities;Therapeutic exercise;Balance training;Patient/family education (canalith repositioning maneuver)    PT Goals (Current goals can be found in the Care Plan section)  Acute Rehab PT Goals Patient Stated Goal: to go home PT Goal Formulation: With patient Time For Goal Achievement: 06/30/20 Potential to Achieve Goals: Good    Frequency Min 3X/week   Barriers to discharge        Co-evaluation               AM-PAC PT "6 Clicks" Mobility  Outcome Measure Help needed turning from your back to your side while in a flat bed without using bedrails?: None Help needed moving from lying on your back to sitting on the side of a flat bed without using bedrails?: None Help needed moving to and from a bed to a chair (including a wheelchair)?: A Little Help needed standing up from a chair using your arms (e.g., wheelchair or bedside chair)?: A Little Help needed to walk in hospital room?: A Little Help needed climbing 3-5 steps with a railing? : A Little 6 Click Score: 20    End of Session Equipment Utilized During Treatment: Gait belt Activity Tolerance: Patient limited by fatigue Patient left: in chair;with call bell/phone within reach Nurse Communication: Mobility status PT Visit Diagnosis: Dizziness and giddiness (R42);BPPV BPPV - Right/Left : Left    Time: 1308-6578 PT Time Calculation (min) (ACUTE ONLY): 26 min   Charges:   PT Evaluation $PT Eval Moderate Complexity: 1 Mod PT Treatments $Canalith Rep Proc: 8-22 mins        Javarion Douty W,PT Acute Rehabilitation Services Pager:  [phone]  Office:  [phone]    Berline Lopes 06/23/2020, 12:12 PM

## 2020-06-23 NOTE — Discharge Summary (Signed)
Physician Discharge Summary  Adetokunbo Bollenbach Fung WJX:[id number] DOB: 1946/11/07 DOA: 06/22/2020  PCP: Joycelyn Rua, MD  Admit date: 06/22/2020 Discharge date: 06/23/2020  Admitted From:home Disposition:  home  Recommendations for Outpatient Follow-up:  1. Follow up with PCP in 1-2 weeks 2. Please obtain BMP/CBC in one week 3.   Home Health:out patient vestibular rehab Equipment/Devices:none Discharge Condition:stable CODE STATUS full Diet recommendation: cardiac Brief/Interim Summary:Brad Woods is a 74 y.o. male with history of CAD status post stenting, hypertension, diastolic CHF sleep apnea who has had recent cardiac cath about 3 weeks ago at that time recommended medical management.  Experiencing dizziness off and on last few days but got acutely worse this morning after waking up.  Patient states when he tried to get up from the bed this morning he felt so dizzy he had to fall back onto the bed.  He did not have any nausea vomiting headache visual symptoms or any focal deficits.  This dizziness continued throughout the day and he decided to come to the ER.  ED Course: In the ER patient was not orthostatic MRI brain was unremarkable EKG showed normal sinus rhythm.  Covid test was negative labs were largely unremarkable except for platelets being around 146 which appears to be new.  Patient was given medications for dizziness despite which patient remained dizzy and found it difficult to walk admitted for further management likely from benign positional vertigo.   Discharge Diagnoses:  Principal Problem:   Dizziness Active Problems:   Essential hypertension   Hypothyroidism   OSA (obstructive sleep apnea)   Coronary artery disease involving native coronary artery of native heart with angina pectoris (HCC)   #1 BPPV-patient admitted with dizziness which improved with physical therapy maneuvering/meclizine.  Per physical therapy notes --Pt positive for left anterior canal BPPV  and PT performed canalith repositioning for pt and pt reports relief of symptoms from 8/10- 3/10 after the treatment.  Pt still slightly unsteady on his feet but overall feels better. Encouraged pt to use RW initially if he d/c's for safety and pt agrees.  OUtpt PT follow up recommended.  Also gave pt Austin Miles exercise as well.    Pt currently with functional limitations due to the deficits listed below (see PT Problem List). Pt will benefit from skilled PT to increase their independence and safety with mobility to allow discharge to the venue listed below.   They recommend outpatient PT vestibular rehab. Patient comfortable and willing and able to go home today with meclizine prescription and outpatient rehab.    #2 CAD status post recent cardiac cath and stenting 3 weeks ago continue medical management as he was doing at home.  Continue Repatha.  #3 htn- continue Imdur and beta-blockers.  #4  Sleep apnea on CPAP at home  #5 hyperlipidemia on Repatha.   Estimated body mass index is 38.52 kg/m as calculated from the following:   Height as of this encounter: 6\' 2"  (1.88 m).   Weight as of this encounter: 136.1 kg.  Discharge Instructions  Discharge Instructions    Diet - low sodium heart healthy   Complete by: As directed    Increase activity slowly   Complete by: As directed      Allergies as of 06/23/2020      Reactions   Hydromorphone Hcl Other (See Comments)   "flatlined" - Patient states he was overdosed. He states he isn't allergic.   Hydromorphone    Cardiac arrest   Aleve [naproxen Sodium]  Dermatitis   States he was given aleve in the hospital and it caused welts.       Medication List    STOP taking these medications   Advil 200 MG tablet Generic drug: ibuprofen     TAKE these medications   albuterol 108 (90 Base) MCG/ACT inhaler Commonly known as: VENTOLIN HFA Inhale 2 puffs into the lungs every 6 (six) hours as needed for wheezing or shortness of breath.    aspirin 81 MG EC tablet Take 1 tablet (81 mg total) by mouth daily.   carvedilol 12.5 MG tablet Commonly known as: COREG Take 1 tablet (12.5 mg total) by mouth 2 (two) times daily.   CENTRUM SILVER 50+MEN PO Take 1 tablet by mouth every other day.   clopidogrel 75 MG tablet Commonly known as: PLAVIX TAKE 1 TABLET BY MOUTH EVERY DAY   finasteride 5 MG tablet Commonly known as: PROSCAR Take 5 mg by mouth daily.   fluticasone 50 MCG/ACT nasal spray Commonly known as: FLONASE Place 2 sprays into the nose daily as needed for allergies.   isosorbide mononitrate 60 MG 24 hr tablet Commonly known as: IMDUR TAKE 1 TABLET BY MOUTH EVERY DAY   meclizine 25 MG tablet Commonly known as: ANTIVERT Take 1 tablet (25 mg total) by mouth 3 (three) times daily as needed for dizziness.   nitroGLYCERIN 0.4 MG SL tablet Commonly known as: NITROSTAT PLACE 1 TABLET (0.4 MG TOTAL) UNDER THE TONGUE EVERY 5 (FIVE) MINUTES AS NEEDED FOR CHEST PAIN.   ondansetron 4 MG tablet Commonly known as: ZOFRAN Take 1 tablet (4 mg total) by mouth every 6 (six) hours as needed for nausea.   Repatha SureClick 140 MG/ML Soaj Generic drug: Evolocumab INJECT 1 PEN INTO THE SKIN EVERY 14 (FOURTEEN) DAYS. What changed: See the new instructions.   tamsulosin 0.4 MG Caps capsule Commonly known as: FLOMAX Take 0.4 mg by mouth at bedtime.       Follow-up Information    Schedule an appointment as soon as possible for a visit  with Joycelyn Rua, MD.   Specialty: Family Medicine Contact information: [address] [place] [address] [phone]              Allergies  Allergen Reactions  . Hydromorphone Hcl Other (See Comments)    "flatlined" - Patient states he was overdosed. He states he isn't allergic.  Marland Kitchen Hydromorphone     Cardiac arrest  . Aleve [Naproxen Sodium] Dermatitis    States he was given aleve in the hospital and it caused welts.     Consultations: Physical therapy  for vestibular rehab  Procedures/Studies: DG Chest 2 View  Result Date: 06/22/2020 CLINICAL DATA:  Dizziness and sweating EXAM: CHEST - 2 VIEW COMPARISON:  07/29/2018 FINDINGS: Prominent left ventricular heart size. Aortic tortuosity that appears similar. There is no edema, consolidation, effusion, or pneumothorax. Borderline vascular congestion with cephalized blood flow but not convincingly changed on the frontal view. Remote left fifth rib fracture. Spondylosis IMPRESSION: No acute finding when compared with prior. Electronically Signed   By: Marnee Spring M.D.   On: 06/22/2020 07:15   CT Head Wo Contrast  Result Date: 06/22/2020 CLINICAL DATA:  Vertigo EXAM: CT HEAD WITHOUT CONTRAST TECHNIQUE: Contiguous axial images were obtained from the base of the skull through the vertex without intravenous contrast. COMPARISON:  MRI 12/11/2005 FINDINGS: Brain: No acute territorial infarction, hemorrhage or intracranial mass. Mild to moderate atrophy. Slightly prominent ventricles felt secondary to  atrophy. Vascular: No hyperdense vessels. Scattered carotid vascular calcification Skull: Normal. Negative for fracture or focal lesion. Sinuses/Orbits: No acute finding. Other: None IMPRESSION: 1. No CT evidence for acute intracranial abnormality. 2. Atrophy. Electronically Signed   By: Jasmine Pang M.D.   On: 06/22/2020 16:06   MR BRAIN WO CONTRAST  Result Date: 06/22/2020 CLINICAL DATA:  Central vertigo EXAM: MRI HEAD WITHOUT CONTRAST TECHNIQUE: Multiplanar, multiecho pulse sequences of the brain and surrounding structures were obtained without intravenous contrast. COMPARISON:  CT head 06/22/2020 FINDINGS: Brain: Generalized atrophy with mild ventricular enlargement. Patchy white matter hyperintensities bilaterally compatible with chronic ischemia. Negative for acute infarct, hemorrhage, mass Vascular: Normal arterial flow voids. Left vertebral artery dominant. Skull and upper cervical spine: No focal skeletal  lesion. Sinuses/Orbits: Paranasal sinuses clear.  Negative orbit Other: None IMPRESSION: Atrophy and mild chronic microvascular ischemic change. No acute infarct. Electronically Signed   By: Marlan Palau M.D.   On: 06/22/2020 17:26   CARDIAC CATHETERIZATION  Result Date: 05/31/2020  RPDA-1 lesion is 75% stenosed.  RPDA-2 lesion is 50% stenosed.  Ost LAD to Prox LAD lesion is 40% stenosed.  Prox RCA-1 lesion is 20% stenosed.  Non-stenotic Prox RCA-2 lesion.  1st Diag lesion is 40% stenosed.  Prox LAD to Mid LAD lesion is 30% stenosed.  Non-stenotic Mid LAD-1 lesion.  Mid LAD-2 lesion is 50% stenosed.  2nd Diag lesion is 50% stenosed.  Dist LAD-1 lesion is 50% stenosed.  Dist LAD-2 lesion is 75% stenosed.  1st Mrg-1 lesion is 20% stenosed.  1st Mrg-2 lesion is 20% stenosed.  Previously placed Prox Cx to Mid Cx stent (unknown type) is widely patent.  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  There is diffuse 50% stenoses in the mid LAD second diagonal vessel and focal 75% apical LAD stenosis and small caliber apical segment.  The left circumflex stent is widely patent.  There is 20% narrowing in the OM1 vessel.  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  There is 7075% ostial stenosis of the PDA vessel with diffuse 50% mid stenosis not significantly changed from the 2017 angiographic studies. Low normal LV function with EF estimated 50 to 55%.  LVEDP 17 mm. RECOMMENDATION: Patient has been on DAPT therapy with aspirin/Plavix.  Recommend medical therapy for his CAD.  Recommend 2D echo Doppler study for reassessment of systolic as well as diastolic function and valvular architecture.  Aggressive lipid-lowering therapy with target LDL less than 70 and optimal blood pressure control target  less than 130/80.   (Echo, Carotid, EGD, Colonoscopy, ERCP)    Subjective:  Patient resting in bed feels better after his PT  Feels safe to go home  discharge Exam: Vitals:   06/23/20 0450 06/23/20 0939  BP: (!) 124/45 (!) 134/57  Pulse: (!) 59 60  Resp: 16   Temp: (!) 97.5 F (36.4 C)   SpO2: 95%    Vitals:   06/22/20 2220 06/23/20 0201 06/23/20 0450 06/23/20 0939  BP: (!) 153/67 (!) 142/61 (!) 124/45 (!) 134/57  Pulse: 62 62 (!) 59 60  Resp: 17 16 16    Temp: 98.3 F (36.8 C) 97.7 F (36.5 C) (!) 97.5 F (36.4 C)   TempSrc: Oral Oral    SpO2: 98% 99% 95%   Weight:  Height:        General: Pt is alert, awake, not in acute distress Cardiovascular: RRR, S1/S2 +, no rubs, no gallops Respiratory: CTA bilaterally, no wheezing, no rhonchi Abdominal: Soft, NT, ND, bowel sounds + Extremities: no edema, no cyanosis    The results of significant diagnostics from this hospitalization (including imaging, microbiology, ancillary and laboratory) are listed below for reference.     Microbiology: Recent Results (from the past 240 hour(s))  SARS Coronavirus 2 by RT PCR (hospital order, performed in [facility] hospital lab) Nasopharyngeal Nasopharyngeal Swab     Status: None   Collection Time: 06/22/20  8:15 PM   Specimen: Nasopharyngeal Swab  Result Value Ref Range Status   SARS Coronavirus 2 NEGATIVE NEGATIVE Final    Comment: (NOTE) SARS-CoV-2 target nucleic acids are NOT DETECTED.  The SARS-CoV-2 RNA is generally detectable in upper and lower respiratory specimens during the acute phase of infection. The lowest concentration of SARS-CoV-2 viral copies this assay can detect is 250 copies / mL. A negative result does not preclude SARS-CoV-2 infection and should not be used as the sole basis for treatment or other patient management decisions.  A negative result may occur with improper specimen collection / handling, submission of specimen other than nasopharyngeal  swab, presence of viral mutation(s) within the areas targeted by this assay, and inadequate number of viral copies (<250 copies / mL). A negative result must be combined with clinical observations, patient history, and epidemiological information.  Fact Sheet for Patients:   BoilerBrush.com.cy  Fact Sheet for Healthcare Providers: https://pope.com/  This test is not yet approved or  cleared by the [place] FDA and has been authorized for detection and/or diagnosis of SARS-CoV-2 by FDA under an Emergency Use Authorization (EUA).  This EUA will remain in effect (meaning this test can be used) for the duration of the COVID-19 declaration under Section 564(b)(1) of the Act, 21 U.S.C. section 360bbb-3(b)(1), unless the authorization is terminated or revoked sooner.  Performed at [facility], [address], [place], [place] [address]      Labs: BNP (last 3 results) No results for input(s): BNP in the last 8760 hours. Basic Metabolic Panel: Recent Labs  Lab 06/22/20 0629 06/22/20 2355  NA 139 138  K 4.0 3.9  CL 105 104  CO2 23 27  GLUCOSE 116* 124*  BUN 17 14  CREATININE 1.01 1.01  1.03  CALCIUM 9.1 8.8*   Liver Function Tests: No results for input(s): AST, ALT, ALKPHOS, BILITOT, PROT, ALBUMIN in the last 168 hours. No results for input(s): LIPASE, AMYLASE in the last 168 hours. No results for input(s): AMMONIA in the last 168 hours. CBC: Recent Labs  Lab 06/22/20 0629 06/22/20 2355  WBC 5.7 5.8  HGB 15.6 14.7  HCT 46.8 44.7  MCV 89.1 88.5  PLT 146* 141*   Cardiac Enzymes: No results for input(s): CKTOTAL, CKMB, CKMBINDEX, TROPONINI in the last 168 hours. BNP: Invalid input(s): POCBNP CBG: No results for input(s): GLUCAP in the last 168 hours. D-Dimer No results for input(s): DDIMER in the last 72 hours. Hgb A1c No results for input(s): HGBA1C in the last 72 hours. Lipid Profile No results for  input(s): CHOL, HDL, LDLCALC, TRIG, CHOLHDL, LDLDIRECT in the last 72 hours. Thyroid function studies No results for input(s): TSH, T4TOTAL, T3FREE, THYROIDAB in the last 72 hours.  Invalid input(s): FREET3 Anemia work up No results for input(s): VITAMINB12, FOLATE, FERRITIN, TIBC, IRON, RETICCTPCT in the last 72  hours. Urinalysis No results found for: COLORURINE, APPEARANCEUR, LABSPEC, PHURINE, GLUCOSEU, HGBUR, BILIRUBINUR, KETONESUR, PROTEINUR, UROBILINOGEN, NITRITE, LEUKOCYTESUR Sepsis Labs Invalid input(s): PROCALCITONIN,  WBC,  LACTICIDVEN Microbiology Recent Results (from the past 240 hour(s))  SARS Coronavirus 2 by RT PCR (hospital order, performed in [facility] hospital lab) Nasopharyngeal Nasopharyngeal Swab     Status: None   Collection Time: 06/22/20  8:15 PM   Specimen: Nasopharyngeal Swab  Result Value Ref Range Status   SARS Coronavirus 2 NEGATIVE NEGATIVE Final    Comment: (NOTE) SARS-CoV-2 target nucleic acids are NOT DETECTED.  The SARS-CoV-2 RNA is generally detectable in upper and lower respiratory specimens during the acute phase of infection. The lowest concentration of SARS-CoV-2 viral copies this assay can detect is 250 copies / mL. A negative result does not preclude SARS-CoV-2 infection and should not be used as the sole basis for treatment or other patient management decisions.  A negative result may occur with improper specimen collection / handling, submission of specimen other than nasopharyngeal swab, presence of viral mutation(s) within the areas targeted by this assay, and inadequate number of viral copies (<250 copies / mL). A negative result must be combined with clinical observations, patient history, and epidemiological information.  Fact Sheet for Patients:   BoilerBrush.com.cy  Fact Sheet for Healthcare Providers: https://pope.com/  This test is not yet approved or  cleared by the [place] FDA and has been authorized for detection and/or diagnosis of SARS-CoV-2 by FDA under an Emergency Use Authorization (EUA).  This EUA will remain in effect (meaning this test can be used) for the duration of the COVID-19 declaration under Section 564(b)(1) of the Act, 21 U.S.C. section 360bbb-3(b)(1), unless the authorization is terminated or revoked sooner.  Performed at [facility], [address] [place], [place] [address]      Time coordinating discharge: 39 minutes  SIGNED:   Alwyn Ren, MD  [facility] 06/23/2020, 1:29 PM

## 2020-06-23 NOTE — Progress Notes (Signed)
Brad Woods to be D/C'd  per MD order. Discussed with the patient and all questions fully answered.  VSS, Skin clean, dry and intact without evidence of skin break down, no evidence of skin tears noted.  IV catheter discontinued intact. Site without signs and symptoms of complications. Dressing and pressure applied.  An After Visit Summary was printed and given to the patient. Patient received prescription.  D/c education completed with patient/family including follow up instructions, medication list, d/c activities limitations if indicated, with other d/c instructions as indicated by MD - patient able to verbalize understanding, all questions fully answered.   Patient instructed to return to ED, call 911, or call MD for any changes in condition.   Patient to be escorted via [place], and D/C home via private auto.

## 2020-06-23 NOTE — TOC Initial Note (Signed)
Transition of Care [facility]) - Initial/Assessment Note    Patient Details  Name: Brad Woods MRN: [id number] Date of Birth: November 10, 1946  Transition of Care [facility]) CM/SW Contact:    Doy Hutching, LCSW Phone Number: 06/23/2020, 11:30 AM  Clinical Narrative:                 CSW spoke with pt at bedside. Introduced self, role, reason for visit. Pt from home with his spouse Chyrl Civatte, she is likely coming to visit later today. Pt does not use any DME at home, confirmed home address and PCP. Pt has been having dizziness, and was recently here for a cath procedure. Pt aware PT coming to see him today if able. Pt has had experience with outpatient PT and inpatient rehab after bilateral knee replacements at [facility]). Encouraged him to ask the PT for their recommendations and let him know TOC team (CSW or RNCM) would f/u with him as he progresses and based on their recommendations.   Expected Discharge Plan: Home w Home Health Services (vs. SNF) Barriers to Discharge: Continued Medical Work up, English as a second language teacher   Patient Goals and CMS Choice Patient states their goals for this hospitalization and ongoing recovery are:: to have the dizziness resolved, feel better Choice offered to / list presented to : Patient  Expected Discharge Plan and Services Expected Discharge Plan: Home w Home Health Services (vs. SNF) In-house Referral: Clinical Social Work Discharge Planning Services: CM Consult Living arrangements for the past 2 months: Single Family Home  Prior Living Arrangements/Services Living arrangements for the past 2 months: Single Family Home Lives with:: Spouse Patient language and need for interpreter reviewed:: Yes (no needs) Do you feel safe going back to the place where you live?: Yes      Need for Family Participation in Patient Care: Yes (Comment) (assistance as needed) Care giver support system in place?: Yes (comment) (pt wife)   Criminal Activity/Legal  Involvement Pertinent to Current Situation/Hospitalization: No - Comment as needed  Permission Sought/Granted Permission sought to share information with : Family Supports Permission granted to share information with : Yes, Verbal Permission Granted  Share Information with NAME: Insurance claims handler     Permission granted to share info w Relationship: wife  Permission granted to share info w Contact Information: [phone]  Emotional Assessment Appearance:: Appears stated age Attitude/Demeanor/Rapport: Engaged Affect (typically observed): Accepting, Adaptable, Appropriate, Pleasant Orientation: : Oriented to Self, Oriented to Place, Oriented to  Time, Oriented to Situation Alcohol / Substance Use: Not Applicable Psych Involvement:  (n/a)  Admission diagnosis:  Dizziness [R42] Patient Active Problem List   Diagnosis Date Noted  . Dizziness 06/22/2020  . Exertional dyspnea   . Chronic pain of left knee 09/02/2018  . Chronic pain of right knee 09/02/2018  . History of bilateral knee replacement 09/02/2018  . Allergic rhinitis 08/15/2018  . Erectile dysfunction 08/15/2018  . History of renal stone 08/15/2018  . Migraine 08/15/2018  . Osteoarthritis 08/15/2018  . Hyperlipidemia 08/15/2018  . PVD (peripheral vascular disease) (HCC) 08/15/2018  . Left shoulder pain 07/30/2018  . Chest pain 07/30/2018  . Trochanteric bursitis, left hip 05/02/2017  . Carpal tunnel syndrome, left upper limb 05/02/2017  . Trochanteric bursitis, right hip 03/18/2017  . Mixed hyperlipidemia 12/31/2016  . Coronary artery disease involving native coronary artery of native heart with angina pectoris (HCC) 11/25/2016  . Coronary artery disease involving native coronary artery of native heart without angina pectoris 11/20/2016  . Elevated troponin   .  Unstable angina (HCC) 05/18/2016  . Acute coronary syndrome (HCC) 05/18/2016  . Benign non-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  10/12/2014  . Essential hypertension 10/12/2014  . 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 10/12/2014  . High risk medication use 10/12/2014  . IFG (impaired fasting glucose) 10/12/2014  . OSA (obstructive sleep apnea) 02/04/2014  . Hypothyroidism 03/02/2013  . GERD (gastroesophageal reflux disease) 09/04/2012   PCP:  Joycelyn Rua, MD Pharmacy:   [facility] [phone] [place] [address] [place] [address] Phone: [phone] Fax: [phone]  Readmission Risk Interventions No flowsheet data found.

## 2020-06-24 ENCOUNTER — Ambulatory Visit: Payer: PPO | Admitting: Cardiovascular Disease

## 2020-06-28 DIAGNOSIS — H8110 Benign paroxysmal vertigo, unspecified ear: Secondary | ICD-10-CM | POA: Diagnosis not present

## 2020-06-28 DIAGNOSIS — I10 Essential (primary) hypertension: Secondary | ICD-10-CM | POA: Diagnosis not present

## 2020-06-29 DIAGNOSIS — H8111 Benign paroxysmal vertigo, right ear: Secondary | ICD-10-CM | POA: Diagnosis not present

## 2020-07-06 ENCOUNTER — Ambulatory Visit: Payer: PPO | Admitting: Physical Therapy

## 2020-07-06 DIAGNOSIS — H8111 Benign paroxysmal vertigo, right ear: Secondary | ICD-10-CM | POA: Diagnosis not present

## 2020-07-13 ENCOUNTER — Other Ambulatory Visit: Payer: Self-pay | Admitting: *Deleted

## 2020-07-13 ENCOUNTER — Other Ambulatory Visit: Payer: Self-pay | Admitting: Cardiovascular Disease

## 2020-07-13 MED ORDER — CARVEDILOL 12.5 MG PO TABS: 12.5000 mg | ORAL_TABLET | Freq: Two times a day (BID) | ORAL | 3 refills | Status: DC

## 2020-07-13 MED ORDER — NITROGLYCERIN 0.4 MG SL SUBL: 0.4000 mg | SUBLINGUAL_TABLET | SUBLINGUAL | 3 refills | Status: DC | PRN

## 2020-07-13 MED ORDER — CLOPIDOGREL BISULFATE 75 MG PO TABS: 75.0000 mg | ORAL_TABLET | Freq: Every day | ORAL | 3 refills | Status: DC

## 2020-07-13 MED ORDER — ISOSORBIDE MONONITRATE ER 60 MG PO TB24: 60.0000 mg | ORAL_TABLET | Freq: Every day | ORAL | 3 refills | Status: DC

## 2020-07-13 NOTE — Telephone Encounter (Signed)
*  STAT* If patient is at the pharmacy, call can be transferred to refill team.   1. Which medications need to be refilled? (please list name of each medication and dose if known)  carvedilol (COREG) 12.5 MG tablet clopidogrel (PLAVIX) 75 MG tablet nitroGLYCERIN (NITROSTAT) 0.4 MG SL tablet REPATHA SURECLICK 053 MG/ML SOAJ isosorbide mononitrate (IMDUR) 60 MG 24 hr tablet 2. Which pharmacy/location (including street and city if local pharmacy) is medication to be sent to? [facility]   3. Do they need a 30 day or 90 day supply? 90 day supply

## 2020-07-13 NOTE — Telephone Encounter (Signed)
All rx's have been sent in as requested with the exception of the repatha. Routing msg to PharmD pool to be addressed.

## 2020-07-14 MED ORDER — REPATHA SURECLICK 140 MG/ML ~~LOC~~ SOAJ: SUBCUTANEOUS | 3 refills | Status: DC

## 2020-07-14 NOTE — Telephone Encounter (Signed)
Refill sent in

## 2020-07-15 DIAGNOSIS — J45909 Unspecified asthma, uncomplicated: Secondary | ICD-10-CM | POA: Diagnosis not present

## 2020-07-15 DIAGNOSIS — N400 Enlarged prostate without lower urinary tract symptoms: Secondary | ICD-10-CM | POA: Diagnosis not present

## 2020-07-15 DIAGNOSIS — E7800 Pure hypercholesterolemia, unspecified: Secondary | ICD-10-CM | POA: Diagnosis not present

## 2020-07-15 DIAGNOSIS — I10 Essential (primary) hypertension: Secondary | ICD-10-CM | POA: Diagnosis not present

## 2020-07-15 DIAGNOSIS — I2510 Atherosclerotic heart disease of native coronary artery without angina pectoris: Secondary | ICD-10-CM | POA: Diagnosis not present

## 2020-07-15 DIAGNOSIS — M1990 Unspecified osteoarthritis, unspecified site: Secondary | ICD-10-CM | POA: Diagnosis not present

## 2020-07-27 ENCOUNTER — Other Ambulatory Visit: Payer: Self-pay

## 2020-07-27 ENCOUNTER — Encounter: Payer: Self-pay | Admitting: Cardiovascular Disease

## 2020-07-27 ENCOUNTER — Ambulatory Visit: Payer: PPO | Admitting: Cardiovascular Disease

## 2020-07-27 VITALS — BP 124/70 | HR 76 | Ht 74.0 in | Wt 306.4 lb

## 2020-07-27 DIAGNOSIS — E782 Mixed hyperlipidemia: Secondary | ICD-10-CM

## 2020-07-27 DIAGNOSIS — I25118 Atherosclerotic heart disease of native coronary artery with other forms of angina pectoris: Secondary | ICD-10-CM | POA: Diagnosis not present

## 2020-07-27 DIAGNOSIS — G720 Drug-induced myopathy: Secondary | ICD-10-CM

## 2020-07-27 DIAGNOSIS — I2510 Atherosclerotic heart disease of native coronary artery without angina pectoris: Secondary | ICD-10-CM | POA: Diagnosis not present

## 2020-07-27 DIAGNOSIS — R42 Dizziness and giddiness: Secondary | ICD-10-CM | POA: Diagnosis not present

## 2020-07-27 DIAGNOSIS — I10 Essential (primary) hypertension: Secondary | ICD-10-CM

## 2020-07-27 LAB — BASIC METABOLIC PANEL
BUN/Creatinine Ratio: 15 (ref 10–24)
BUN: 15 mg/dL (ref 8–27)
CO2: 22 mmol/L (ref 20–29)
Calcium: 9.4 mg/dL (ref 8.6–10.2)
Chloride: 101 mmol/L (ref 96–106)
Creatinine, Ser: 1.01 mg/dL (ref 0.76–1.27)
GFR calc Af Amer: 85 mL/min/{1.73_m2} (ref >59)
GFR calc non Af Amer: 73 mL/min/{1.73_m2} (ref >59)
Glucose: 105 mg/dL — ABNORMAL HIGH (ref 65–99)
Potassium: 4.5 mmol/L (ref 3.5–5.2)
Sodium: 137 mmol/L (ref 134–144)

## 2020-07-27 LAB — HEPATIC FUNCTION PANEL
ALT: 19 IU/L (ref 0–44)
AST: 17 IU/L (ref 0–40)
Albumin: 4.4 g/dL (ref 3.7–4.7)
Alkaline Phosphatase: 83 IU/L (ref 48–121)
Bilirubin Total: 0.6 mg/dL (ref 0.0–1.2)
Bilirubin, Direct: 0.17 mg/dL (ref 0.00–0.40)
Total Protein: 6.7 g/dL (ref 6.0–8.5)

## 2020-07-27 MED ORDER — ISOSORBIDE MONONITRATE ER 30 MG PO TB24
30.0000 mg | ORAL_TABLET | Freq: Every day | ORAL | 3 refills | Status: DC
Start: 2020-07-27 — End: 2021-09-04

## 2020-07-27 NOTE — Patient Instructions (Addendum)
Medication Instructions:  Your physician has recommended you make the following change in your medication:  1) DECREASE Imdur 30 mg daily  *If you need a refill on your cardiac medications before your next appointment, please call your pharmacy*  Labs: TODAY: Liver and BMET  Testing/Procedures: Your physician has requested that you have an echocardiogram. Echocardiography is a painless test that uses sound waves to create images of your heart. It provides your doctor with information about the size and shape of your heart and how well your heart's chambers and valves are working. This procedure takes approximately one hour. There are no restrictions for this procedure.  Follow-Up: At [facility], you and your health needs are our priority.  As part of our continuing mission to provide you with exceptional heart care, we have created designated Provider Care Teams.  These Care Teams include your primary Cardiologist (physician) and Advanced Practice Providers (APPs -  Physician Assistants and Nurse Practitioners) who all work together to provide you with the care you need, when you need it.  Your next appointment:   6 month(s)  The format for your next appointment:   In Person  Provider:   You may see Mertie Moores, MD or one of the following Advanced Practice Providers on your designated Care Team:    Melina Copa, PA-C  Ermalinda Barrios, PA-C

## 2020-07-27 NOTE — Progress Notes (Addendum)
Cardiology Office Note   Date:  07/27/2020   ID:  Brad Woods, DOB Apr 28, 1946, MRN [id number]  PCP:  Joycelyn Rua, MD  Cardiologist:   Kristeen Miss, MD   Problem list 1.  Coronary artery disease-status post stenting 11/17 2.  Chronic diastolic congestive heart failure  Chief Complaint  Patient presents with   Coronary Artery Disease     Brad Woods is a 74 y.o. male who presents for follow up of his CAD.   Seen with wife , [name]  Has been having CP since May. Went to the [facility] , was sent to the hospital May 18, 2016 Was admitted,    At 2 AM , his CP worsened acutely .   Had a stent to his LCx.   ( 90% - 0%)   He wanted to change cardiologist and presents for further management .   Has been having CP similar to his presenting symptom in May. Seem to be worse with exertion, better with rest.   Had another episode of CP last night. Mid sternal CP, radiation down his left arm .  Took a SL NTG  - CP was relieved with NTG .   Has had some shortness of breath - possibly shortly after taking the Brilinta   Also has some belching relieves the CP    does not get any regular exercise. Before the MI, was going several times a week   [name]. 8, 2018:  Brad Woods had his heart catheterization since her last visit. He was found have moderate disease. His stents are patent. He did not require any additional stenting. He was changed from Brilinta to Plavix because of shortness of breath. He seems to be feeling a lot better after that medicine change. Wants to get back into cardiac rehab   May 13, 2017:  Brad Woods is seen for follow up of his CAD. We changed the Brilinta to plavix which helped his dyspnea Rare episodes of CP Has indigestion frequently ,  Belches seem to relieve his CP on occasion .  Exercises regularly  Wt Readings from Last 3 Encounters:  07/27/20 (!) 306 lb 6.4 oz (139 kg)  06/22/20 (!) 300 lb 0.7 oz (136.1 kg)  05/31/20 300 lb (136.1 kg)   Wt is down 7  lbs since January .   November 11, 2017:  Brad Woods is seen today for follow-up visit. Hx of CAD, hyperlipidemia,  Stopped atorvastatin 2-3 weeks ago due due to diffuse muscle aches and pains.  He is feeling much better since stopping the atorvastatin. BP looks good today  Had some atypical CP , related to indigestion / gas pains  Getting some exercise.  Lots of ortho issues.   January 08, 2019:  Brad Woods seen today for further evaluation of his hypertension. Wt today is 306 lbs  He measures his blood pressure at home.  He has been getting lots of high readings.  His blood pressure is normal here in the office today.  Does not exercise as much as he would like . Has knee issues  Sees Dr. Magnus Ivan  Needs to lose weight   No CP or angina   July 27, 2020:  Brad Woods is seen today for follow-up of his coronary artery disease.  He status post stenting in the past.  He also has a history of hyperlipidemia, obesity.  Has a history of hypertension.  His blood pressure has been fairly well controlled. Weight today is 306 pounds.  Is having swimmy headedness.  Was having vertigo Is lightheaded.  Feels like he is on a boat.   Is worse going from seated to standing  Went to the ER with dizziness on June 30.   Head MRI in the ER was unremarkable Was not orthostatic   Heart catheterization on May 31, 2020 reveals moderate diffuse disease.  He has previously placed stent in the proximal circumflex artery was widely patent.  There were no critical lesions that needed any other intervention.  He ran out of Losartan  - his    Past Medical History:  Diagnosis Date   Allergy    Arthritis    Asthma    when younger   GERD (gastroesophageal reflux disease)    Headache    Hyperlipidemia    Hypertension    Kidney stones    Myocardial infarction [facility])    Sleep apnea    cpap    Past Surgical History:  Procedure Laterality Date   CARDIAC CATHETERIZATION N/A 05/20/2016   Procedure: Left  Heart Cath and Coronary Angiography;  Surgeon: Runell Gess, MD;  Location: [facility] INVASIVE CV LAB;  Service: Cardiovascular;  Laterality: N/A;   CARDIAC CATHETERIZATION N/A 11/22/2016   Procedure: Left Heart Cath and Coronary Angiography;  Surgeon: Yvonne Kendall, MD;  Location: [facility] INVASIVE CV LAB;  Service: Cardiovascular;  Laterality: N/A;   COLONOSCOPY     EYE SURGERY     LEFT HEART CATH AND CORONARY ANGIOGRAPHY N/A 05/31/2020   Procedure: LEFT HEART CATH AND CORONARY ANGIOGRAPHY;  Surgeon: Lennette Bihari, MD;  Location: [facility] INVASIVE CV LAB;  Service: Cardiovascular;  Laterality: N/A;   RECTAL SURGERY     REPLACEMENT TOTAL KNEE Bilateral      Current Outpatient Medications  Medication Sig Dispense Refill   ADVAIR HFA 115-21 MCG/ACT inhaler Inhale 2 puffs into the lungs 2 (two) times daily as needed.     albuterol (PROVENTIL HFA;VENTOLIN HFA) 108 (90 Base) MCG/ACT inhaler Inhale 2 puffs into the lungs every 6 (six) hours as needed for wheezing or shortness of breath. 1 Inhaler 2   aspirin EC 81 MG EC tablet Take 1 tablet (81 mg total) by mouth daily.     carvedilol (COREG) 12.5 MG tablet Take 1 tablet (12.5 mg total) by mouth 2 (two) times daily. 180 tablet 3   clopidogrel (PLAVIX) 75 MG tablet Take 1 tablet (75 mg total) by mouth daily. 90 tablet 3   Evolocumab (REPATHA SURECLICK) 140 MG/ML SOAJ INJECT 1 PEN INTO THE SKIN EVERY 14 (FOURTEEN) DAYS. 6 pen 3   finasteride (PROSCAR) 5 MG tablet Take 5 mg by mouth daily.     fluticasone (FLONASE) 50 MCG/ACT nasal spray Place 2 sprays into the nose daily as needed for allergies.      isosorbide mononitrate (IMDUR) 60 MG 24 hr tablet Take 1 tablet (60 mg total) by mouth daily. 90 tablet 3   meclizine (ANTIVERT) 25 MG tablet Take 1 tablet (25 mg total) by mouth 3 (three) times daily as needed for dizziness. 30 tablet 0   Multiple Vitamins-Minerals (CENTRUM SILVER 50+MEN PO) Take 1 tablet by mouth every other day.       nitroGLYCERIN (NITROSTAT) 0.4 MG SL tablet Place 1 tablet (0.4 mg total) under the tongue every 5 (five) minutes as needed for chest pain. 25 tablet 3   ondansetron (ZOFRAN) 4 MG tablet Take 1 tablet (4 mg total) by mouth every 6 (six) hours as needed for nausea. 20 tablet 0   tamsulosin (FLOMAX)  0.4 MG CAPS capsule Take 0.4 mg by mouth at bedtime.      No current facility-administered medications for this visit.    Allergies:   Hydromorphone hcl, Hydromorphone, and Aleve [naproxen sodium]    Social History:  The patient  reports that he has never smoked. He has never used smokeless tobacco. He reports current alcohol use. He reports that he does not use drugs.   Family History:  The patient's family history includes Colon cancer (age of onset: 45) in his mother.    ROS:  Please see the history of present illness.   Noted in the current history.  All other systems are negative.Marland Kitchen    Physical Exam: Blood pressure 124/70, pulse 76, height 6\' 2"  (1.88 m), weight (!) 306 lb 6.4 oz (139 kg), SpO2 96 %.  GEN: Elderly, morbidly obese gentleman, no acute distress HEENT: Normal NECK: No JVD; No carotid bruits LYMPHATICS: No lymphadenopathy CARDIAC: RRR , no murmurs, rubs, gallops RESPIRATORY:  Clear to auscultation without rales, wheezing or rhonchi  ABDOMEN: Soft, non-tender, non-distended MUSCULOSKELETAL:  No edema; No deformity  SKIN: Warm and dry NEUROLOGIC:  Alert and oriented x 3   EKG:    Recent Labs: 05/26/2020: ALT 22 06/22/2020: BUN 14; Creatinine, Ser 1.01; Creatinine, Ser 1.03; Hemoglobin 14.7; Platelets 141; Potassium 3.9; Sodium 138    Lipid Panel    Component Value Date/Time   CHOL 114 01/07/2019 0806   TRIG 159 (H) 01/07/2019 0806   HDL 34 (L) 01/07/2019 0806   CHOLHDL 3.4 01/07/2019 0806   CHOLHDL 4.0 11/20/2016 1643   VLDL 31 (H) 11/20/2016 1643   LDLCALC 48 01/07/2019 0806      Wt Readings from Last 3 Encounters:  07/27/20 (!) 306 lb 6.4 oz (139 kg)    06/22/20 (!) 300 lb 0.7 oz (136.1 kg)  05/31/20 300 lb (136.1 kg)      Other studies Reviewed: Additional studies/ records that were reviewed today include: . Review of the above records demonstrates:    ASSESSMENT AND PLAN:  1.  CAD :      No angina.  He has diffuse three-vessel disease.  His stents are widely patent.  There is no lesions that need intervention at this time.   2. Hyperlipidemia:    Check lipids, liver, bmp today .  Cont meds.   He is intolerant of statins due to myopathy. We have referred him to lipid clinic for consideration of PCSK-9 inhibitor  3. Morbid obesity :     Encourage him to work on weight loss.   4.  Hypertension:  BP is actually on the low side.  He is having "swimmy headedness"  Will reduce the Imdur to 30 mg a day and see if this helps.    Current medicines are reviewed at length with the patient today.  The patient does not have concerns regarding medicines.  Labs/ tests ordered today include:  No orders of the defined types were placed in this encounter.    Disposition:   FU with  Me in 6 month     Kristeen Miss, MD  07/27/2020 9:03 AM    [facility] [address] [place], [place], [place]  [address] Phone: [phone]; Fax: [phone]

## 2020-08-01 ENCOUNTER — Ambulatory Visit: Payer: PPO | Admitting: Cardiovascular Disease

## 2020-08-11 ENCOUNTER — Ambulatory Visit (HOSPITAL_COMMUNITY): Payer: PPO | Attending: Cardiology

## 2020-08-11 ENCOUNTER — Other Ambulatory Visit: Payer: Self-pay

## 2020-08-11 DIAGNOSIS — I25118 Atherosclerotic heart disease of native coronary artery with other forms of angina pectoris: Secondary | ICD-10-CM | POA: Insufficient documentation

## 2020-08-11 DIAGNOSIS — R42 Dizziness and giddiness: Secondary | ICD-10-CM | POA: Diagnosis not present

## 2020-08-11 LAB — ECHOCARDIOGRAM COMPLETE
Area-P 1/2: 2.66 cm2
S' Lateral: 3.1 cm

## 2020-08-22 DIAGNOSIS — E7800 Pure hypercholesterolemia, unspecified: Secondary | ICD-10-CM | POA: Diagnosis not present

## 2020-08-22 DIAGNOSIS — I2510 Atherosclerotic heart disease of native coronary artery without angina pectoris: Secondary | ICD-10-CM | POA: Diagnosis not present

## 2020-08-22 DIAGNOSIS — M1990 Unspecified osteoarthritis, unspecified site: Secondary | ICD-10-CM | POA: Diagnosis not present

## 2020-08-22 DIAGNOSIS — N400 Enlarged prostate without lower urinary tract symptoms: Secondary | ICD-10-CM | POA: Diagnosis not present

## 2020-08-22 DIAGNOSIS — I10 Essential (primary) hypertension: Secondary | ICD-10-CM | POA: Diagnosis not present

## 2020-08-22 DIAGNOSIS — J45909 Unspecified asthma, uncomplicated: Secondary | ICD-10-CM | POA: Diagnosis not present

## 2020-08-25 DIAGNOSIS — G720 Drug-induced myopathy: Secondary | ICD-10-CM | POA: Insufficient documentation

## 2020-09-19 DIAGNOSIS — J45909 Unspecified asthma, uncomplicated: Secondary | ICD-10-CM | POA: Diagnosis not present

## 2020-09-19 DIAGNOSIS — I10 Essential (primary) hypertension: Secondary | ICD-10-CM | POA: Diagnosis not present

## 2020-09-19 DIAGNOSIS — M1990 Unspecified osteoarthritis, unspecified site: Secondary | ICD-10-CM | POA: Diagnosis not present

## 2020-09-19 DIAGNOSIS — E7800 Pure hypercholesterolemia, unspecified: Secondary | ICD-10-CM | POA: Diagnosis not present

## 2020-09-19 DIAGNOSIS — I2510 Atherosclerotic heart disease of native coronary artery without angina pectoris: Secondary | ICD-10-CM | POA: Diagnosis not present

## 2020-09-19 DIAGNOSIS — N400 Enlarged prostate without lower urinary tract symptoms: Secondary | ICD-10-CM | POA: Diagnosis not present

## 2020-10-19 ENCOUNTER — Ambulatory Visit (INDEPENDENT_AMBULATORY_CARE_PROVIDER_SITE_OTHER): Payer: PPO

## 2020-10-19 ENCOUNTER — Ambulatory Visit: Payer: PPO | Admitting: Orthopaedic Surgery

## 2020-10-19 DIAGNOSIS — M25511 Pain in right shoulder: Secondary | ICD-10-CM | POA: Diagnosis not present

## 2020-10-19 DIAGNOSIS — G8929 Other chronic pain: Secondary | ICD-10-CM | POA: Diagnosis not present

## 2020-10-19 NOTE — Progress Notes (Signed)
Office Visit Note   Patient: Brad Woods           Date of Birth: 1946/04/18           MRN: [id number] Visit Date: 10/19/2020              Requested by: Joycelyn Rua, MD [address] [place],  [place] [address] PCP: Joycelyn Rua, MD   Assessment & Plan: Visit Diagnoses:  1. Chronic right shoulder pain     Plan: I would like him to get an appointment with Dr. Prince Rome for next week and assessment of his right shoulder under ultrasound with an intra-articular right shoulder joint injection with steroid.  Dr. Prince Rome can make comments about the rotator cuff on the ultrasound.  I do feel he would likely be a candidate eventually of considering a shoulder replacement.  I would like to send him to [facility] physical therapy to work on any modalities to decrease his back pain and work on core strengthening exercises as well as quad strengthening exercises.  They can try work on his shoulder as well.  All questions and concerns were answered and addressed.  Once he does see Dr. Prince Rome and has that intra-articular injection in his right shoulder, Dr. Prince Rome can get an appointment for the patient to follow-up with me in 4 weeks.  Follow-Up Instructions: No follow-ups on file.   Orders:  Orders Placed This Encounter  Procedures  . XR Shoulder Right   No orders of the defined types were placed in this encounter.     Procedures: No procedures performed   Clinical Data: No additional findings.   Subjective: Chief Complaint  Patient presents with  . Right Shoulder - Pain  . Lower Back - Pain  The patient comes in with several chief complaints today.  It is mainly his right shoulder that bothers him.  He reports decreased strength and decreased motion with his right shoulder with time.  He denies any specific injuries.  He has had a subacromial injection less than a year ago.  He said that did not really help.  The pain does wake him up at night.  He is also had some low back  pain.  He has a history of bilateral knee replacements done around 2012.  He reports that his knees hurt he does have painful quads as well.  He is on Plavix and cannot take anti-inflammatories.  He has a cardiac stent as well which he says is stable.  He is not a diabetic and not a smoker.  He denies any other acute changes in medical status.  HPI  Review of Systems He currently denies any headache, chest pain, shortness of breath, fever, chills, nausea, vomiting  Objective: Vital Signs: There were no vitals taken for this visit.  Physical Exam He is alert and oriented x3 and in no acute distress Ortho Exam Examination of his right shoulder shows significant weakness in that shoulder with grinding of the glenohumeral joint.  He uses his deltoids more to abduct his shoulder than his rotator cuff.  There is pain at the [facility] joint as well.  There is limited motion with internal rotation and adduction.  There is no blocks to external rotation.  He does have some truncal obesity.  Flexion-extension lumbar spine causes low back pain.  He has a history of bilateral knee replacements.  The knees move smoothly but there are some clicking to the knees as well.  His  quads are weak. Specialty Comments:  No specialty comments available.  Imaging: XR Shoulder Right  Result Date: 10/19/2020 3 views of the right shoulder show significant glenohumeral arthritis and decrease in subacromial outlet.  There is also arthritic changes at the [facility] joint.    PMFS History: Patient Active Problem List   Diagnosis Date Noted  . 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 Morbid obesity (HCC) 07/27/2020  . Dizziness 06/22/2020  . Exertional dyspnea   . Chronic pain of left knee 09/02/2018  . Chronic pain of right knee 09/02/2018  . History of bilateral knee replacement 09/02/2018  . Allergic rhinitis 08/15/2018  . Erectile dysfunction 08/15/2018  . History of renal stone 08/15/2018  . Migraine 08/15/2018  .  Osteoarthritis 08/15/2018  . Hyperlipidemia 08/15/2018  . PVD (peripheral vascular disease) (HCC) 08/15/2018  . Left shoulder pain 07/30/2018  . Chest pain 07/30/2018  . Trochanteric bursitis, left hip 05/02/2017  . Carpal tunnel syndrome, left upper limb 05/02/2017  . Trochanteric bursitis, right hip 03/18/2017  . Mixed hyperlipidemia 12/31/2016  . Coronary artery disease involving native coronary artery of native heart with angina pectoris (HCC) 11/25/2016  . Coronary artery disease involving native coronary artery of native heart without angina pectoris 11/20/2016  . Elevated troponin   . Unstable angina (HCC) 05/18/2016  . Acute coronary syndrome (HCC) 05/18/2016  . Benign non-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 10/12/2014  . Essential hypertension 10/12/2014  . 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 10/12/2014  . High risk medication use 10/12/2014  . IFG (impaired fasting glucose) 10/12/2014  . OSA (obstructive sleep apnea) 02/04/2014  . Hypothyroidism 03/02/2013  . GERD (gastroesophageal reflux disease) 09/04/2012   Past Medical History:  Diagnosis Date  . Allergy   . Arthritis   . Asthma    when younger  . GERD (gastroesophageal reflux disease)   . Headache   . Hyperlipidemia   . Hypertension   . Kidney stones   . Myocardial infarction (HCC)   . Sleep apnea    cpap    Family History  Problem Relation Age of Onset  . Colon cancer Mother 36    Past Surgical History:  Procedure Laterality Date  . CARDIAC CATHETERIZATION N/A 05/20/2016   Procedure: Left Heart Cath and Coronary Angiography;  Surgeon: Runell Gess, MD;  Location: [facility] INVASIVE CV LAB;  Service: Cardiovascular;  Laterality: N/A;  . CARDIAC CATHETERIZATION N/A 11/22/2016   Procedure: Left Heart Cath and Coronary Angiography;  Surgeon: Yvonne Kendall, MD;  Location: [facility] INVASIVE CV LAB;  Service: Cardiovascular;  Laterality: N/A;  . COLONOSCOPY    . EYE SURGERY    . LEFT HEART CATH AND  CORONARY ANGIOGRAPHY N/A 05/31/2020   Procedure: LEFT HEART CATH AND CORONARY ANGIOGRAPHY;  Surgeon: Lennette Bihari, MD;  Location: [facility] INVASIVE CV LAB;  Service: Cardiovascular;  Laterality: N/A;  . RECTAL SURGERY    . REPLACEMENT TOTAL KNEE Bilateral    Social History   Occupational History  . Not on file  Tobacco Use  . Smoking status: Never Smoker  . Smokeless tobacco: Never Used  Vaping Use  . Vaping Use: Never used  Substance and Sexual Activity  . Alcohol use: Yes    Alcohol/week: 0.0 standard drinks    Comment: occasionally  . Drug use: No  . Sexual activity: Yes    Birth control/protection: None

## 2020-10-21 DIAGNOSIS — I2510 Atherosclerotic heart disease of native coronary artery without angina pectoris: Secondary | ICD-10-CM | POA: Diagnosis not present

## 2020-10-21 DIAGNOSIS — N400 Enlarged prostate without lower urinary tract symptoms: Secondary | ICD-10-CM | POA: Diagnosis not present

## 2020-10-21 DIAGNOSIS — J45909 Unspecified asthma, uncomplicated: Secondary | ICD-10-CM | POA: Diagnosis not present

## 2020-10-21 DIAGNOSIS — I10 Essential (primary) hypertension: Secondary | ICD-10-CM | POA: Diagnosis not present

## 2020-10-21 DIAGNOSIS — E7800 Pure hypercholesterolemia, unspecified: Secondary | ICD-10-CM | POA: Diagnosis not present

## 2020-10-21 DIAGNOSIS — M1990 Unspecified osteoarthritis, unspecified site: Secondary | ICD-10-CM | POA: Diagnosis not present

## 2020-10-26 ENCOUNTER — Ambulatory Visit: Payer: PPO | Admitting: Family Medicine

## 2020-10-31 ENCOUNTER — Ambulatory Visit (INDEPENDENT_AMBULATORY_CARE_PROVIDER_SITE_OTHER): Payer: PPO | Admitting: Family Medicine

## 2020-10-31 ENCOUNTER — Other Ambulatory Visit: Payer: Self-pay

## 2020-10-31 ENCOUNTER — Ambulatory Visit: Payer: Self-pay

## 2020-10-31 ENCOUNTER — Encounter: Payer: Self-pay | Admitting: Family Medicine

## 2020-10-31 DIAGNOSIS — M25511 Pain in right shoulder: Secondary | ICD-10-CM | POA: Diagnosis not present

## 2020-10-31 DIAGNOSIS — G8929 Other chronic pain: Secondary | ICD-10-CM

## 2020-10-31 NOTE — Progress Notes (Signed)
Subjective: Patient is here for ultrasound-guided intra-articular right glenohumeral injection.  Pain and limited ROM due to DJD.  Objective:  Decreased overhead and behind the back reach.  Procedure: Ultrasound guided injection is preferred based studies that show increased duration, increased effect, greater accuracy, decreased procedural pain, increased response rate, and decreased cost with ultrasound guided versus blind injection.   Verbal informed consent obtained.  Time-out conducted.  Noted no overlying erythema, induration, or other signs of local infection. Ultrasound-guided right glenohumeral injection: After sterile prep with Betadine, injected 8 cc 1% lidocaine without epinephrine and 40 mg methylprednisolone using a 22-gauge spinal needle, passing the needle from posterior approach into the glenohumeral joint.  Seen filling the joint capsule.  Good immediate relief.

## 2020-11-20 DIAGNOSIS — I10 Essential (primary) hypertension: Secondary | ICD-10-CM | POA: Diagnosis not present

## 2020-11-20 DIAGNOSIS — N400 Enlarged prostate without lower urinary tract symptoms: Secondary | ICD-10-CM | POA: Diagnosis not present

## 2020-11-20 DIAGNOSIS — K219 Gastro-esophageal reflux disease without esophagitis: Secondary | ICD-10-CM | POA: Diagnosis not present

## 2020-11-20 DIAGNOSIS — J45909 Unspecified asthma, uncomplicated: Secondary | ICD-10-CM | POA: Diagnosis not present

## 2020-11-20 DIAGNOSIS — M1990 Unspecified osteoarthritis, unspecified site: Secondary | ICD-10-CM | POA: Diagnosis not present

## 2020-11-20 DIAGNOSIS — E7800 Pure hypercholesterolemia, unspecified: Secondary | ICD-10-CM | POA: Diagnosis not present

## 2020-11-20 DIAGNOSIS — I2510 Atherosclerotic heart disease of native coronary artery without angina pectoris: Secondary | ICD-10-CM | POA: Diagnosis not present

## 2020-12-19 DIAGNOSIS — N400 Enlarged prostate without lower urinary tract symptoms: Secondary | ICD-10-CM | POA: Diagnosis not present

## 2020-12-19 DIAGNOSIS — M1990 Unspecified osteoarthritis, unspecified site: Secondary | ICD-10-CM | POA: Diagnosis not present

## 2020-12-19 DIAGNOSIS — K219 Gastro-esophageal reflux disease without esophagitis: Secondary | ICD-10-CM | POA: Diagnosis not present

## 2020-12-19 DIAGNOSIS — I10 Essential (primary) hypertension: Secondary | ICD-10-CM | POA: Diagnosis not present

## 2020-12-19 DIAGNOSIS — J45909 Unspecified asthma, uncomplicated: Secondary | ICD-10-CM | POA: Diagnosis not present

## 2020-12-19 DIAGNOSIS — E7800 Pure hypercholesterolemia, unspecified: Secondary | ICD-10-CM | POA: Diagnosis not present

## 2020-12-19 DIAGNOSIS — I2510 Atherosclerotic heart disease of native coronary artery without angina pectoris: Secondary | ICD-10-CM | POA: Diagnosis not present

## 2021-01-23 DIAGNOSIS — E7800 Pure hypercholesterolemia, unspecified: Secondary | ICD-10-CM | POA: Diagnosis not present

## 2021-01-23 DIAGNOSIS — I10 Essential (primary) hypertension: Secondary | ICD-10-CM | POA: Diagnosis not present

## 2021-01-23 DIAGNOSIS — J45909 Unspecified asthma, uncomplicated: Secondary | ICD-10-CM | POA: Diagnosis not present

## 2021-01-23 DIAGNOSIS — I2510 Atherosclerotic heart disease of native coronary artery without angina pectoris: Secondary | ICD-10-CM | POA: Diagnosis not present

## 2021-01-23 DIAGNOSIS — K219 Gastro-esophageal reflux disease without esophagitis: Secondary | ICD-10-CM | POA: Diagnosis not present

## 2021-01-23 DIAGNOSIS — M1990 Unspecified osteoarthritis, unspecified site: Secondary | ICD-10-CM | POA: Diagnosis not present

## 2021-01-23 DIAGNOSIS — N400 Enlarged prostate without lower urinary tract symptoms: Secondary | ICD-10-CM | POA: Diagnosis not present

## 2021-01-24 DIAGNOSIS — M1990 Unspecified osteoarthritis, unspecified site: Secondary | ICD-10-CM | POA: Diagnosis not present

## 2021-01-24 DIAGNOSIS — E7800 Pure hypercholesterolemia, unspecified: Secondary | ICD-10-CM | POA: Diagnosis not present

## 2021-01-24 DIAGNOSIS — N400 Enlarged prostate without lower urinary tract symptoms: Secondary | ICD-10-CM | POA: Diagnosis not present

## 2021-01-24 DIAGNOSIS — I2510 Atherosclerotic heart disease of native coronary artery without angina pectoris: Secondary | ICD-10-CM | POA: Diagnosis not present

## 2021-01-24 DIAGNOSIS — K219 Gastro-esophageal reflux disease without esophagitis: Secondary | ICD-10-CM | POA: Diagnosis not present

## 2021-01-24 DIAGNOSIS — I10 Essential (primary) hypertension: Secondary | ICD-10-CM | POA: Diagnosis not present

## 2021-01-24 DIAGNOSIS — J45909 Unspecified asthma, uncomplicated: Secondary | ICD-10-CM | POA: Diagnosis not present

## 2021-03-20 DIAGNOSIS — H5000 Unspecified esotropia: Secondary | ICD-10-CM | POA: Diagnosis not present

## 2021-03-20 DIAGNOSIS — D3142 Benign neoplasm of left ciliary body: Secondary | ICD-10-CM | POA: Diagnosis not present

## 2021-03-20 DIAGNOSIS — H53042 Amblyopia suspect, left eye: Secondary | ICD-10-CM | POA: Diagnosis not present

## 2021-03-20 DIAGNOSIS — H2513 Age-related nuclear cataract, bilateral: Secondary | ICD-10-CM | POA: Diagnosis not present

## 2021-03-23 DIAGNOSIS — N400 Enlarged prostate without lower urinary tract symptoms: Secondary | ICD-10-CM | POA: Diagnosis not present

## 2021-03-23 DIAGNOSIS — J45909 Unspecified asthma, uncomplicated: Secondary | ICD-10-CM | POA: Diagnosis not present

## 2021-03-23 DIAGNOSIS — K219 Gastro-esophageal reflux disease without esophagitis: Secondary | ICD-10-CM | POA: Diagnosis not present

## 2021-03-23 DIAGNOSIS — I2510 Atherosclerotic heart disease of native coronary artery without angina pectoris: Secondary | ICD-10-CM | POA: Diagnosis not present

## 2021-03-23 DIAGNOSIS — E7800 Pure hypercholesterolemia, unspecified: Secondary | ICD-10-CM | POA: Diagnosis not present

## 2021-03-23 DIAGNOSIS — I10 Essential (primary) hypertension: Secondary | ICD-10-CM | POA: Diagnosis not present

## 2021-03-23 DIAGNOSIS — M1990 Unspecified osteoarthritis, unspecified site: Secondary | ICD-10-CM | POA: Diagnosis not present

## 2021-03-31 DIAGNOSIS — I2510 Atherosclerotic heart disease of native coronary artery without angina pectoris: Secondary | ICD-10-CM | POA: Diagnosis not present

## 2021-03-31 DIAGNOSIS — E7800 Pure hypercholesterolemia, unspecified: Secondary | ICD-10-CM | POA: Diagnosis not present

## 2021-03-31 DIAGNOSIS — Z0000 Encounter for general adult medical examination without abnormal findings: Secondary | ICD-10-CM | POA: Diagnosis not present

## 2021-03-31 DIAGNOSIS — Z125 Encounter for screening for malignant neoplasm of prostate: Secondary | ICD-10-CM | POA: Diagnosis not present

## 2021-03-31 DIAGNOSIS — R309 Painful micturition, unspecified: Secondary | ICD-10-CM | POA: Diagnosis not present

## 2021-03-31 DIAGNOSIS — N400 Enlarged prostate without lower urinary tract symptoms: Secondary | ICD-10-CM | POA: Diagnosis not present

## 2021-03-31 DIAGNOSIS — I10 Essential (primary) hypertension: Secondary | ICD-10-CM | POA: Diagnosis not present

## 2021-04-02 ENCOUNTER — Encounter (HOSPITAL_COMMUNITY): Payer: Self-pay | Admitting: Emergency Medicine

## 2021-04-02 ENCOUNTER — Emergency Department (HOSPITAL_COMMUNITY)
Admission: EM | Admit: 2021-04-02 | Discharge: 2021-04-02 | Disposition: A | Discharge status: Home / Self Care | Payer: PPO | Attending: Emergency Medicine | Admitting: Emergency Medicine

## 2021-04-02 ENCOUNTER — Emergency Department (HOSPITAL_COMMUNITY): Payer: PPO

## 2021-04-02 ENCOUNTER — Other Ambulatory Visit: Payer: Self-pay

## 2021-04-02 DIAGNOSIS — N281 Cyst of kidney, acquired: Secondary | ICD-10-CM | POA: Diagnosis not present

## 2021-04-02 DIAGNOSIS — Z7982 Long term (current) use of aspirin: Secondary | ICD-10-CM | POA: Insufficient documentation

## 2021-04-02 DIAGNOSIS — I25119 Atherosclerotic heart disease of native coronary artery with unspecified angina pectoris: Secondary | ICD-10-CM | POA: Diagnosis not present

## 2021-04-02 DIAGNOSIS — R1031 Right lower quadrant pain: Secondary | ICD-10-CM | POA: Insufficient documentation

## 2021-04-02 DIAGNOSIS — I10 Essential (primary) hypertension: Secondary | ICD-10-CM | POA: Diagnosis not present

## 2021-04-02 DIAGNOSIS — Z7902 Long term (current) use of antithrombotics/antiplatelets: Secondary | ICD-10-CM | POA: Insufficient documentation

## 2021-04-02 DIAGNOSIS — J45909 Unspecified asthma, uncomplicated: Secondary | ICD-10-CM | POA: Diagnosis not present

## 2021-04-02 DIAGNOSIS — M47814 Spondylosis without myelopathy or radiculopathy, thoracic region: Secondary | ICD-10-CM | POA: Diagnosis not present

## 2021-04-02 DIAGNOSIS — Z87442 Personal history of urinary calculi: Secondary | ICD-10-CM | POA: Diagnosis not present

## 2021-04-02 DIAGNOSIS — Z79899 Other long term (current) drug therapy: Secondary | ICD-10-CM | POA: Diagnosis not present

## 2021-04-02 DIAGNOSIS — K429 Umbilical hernia without obstruction or gangrene: Secondary | ICD-10-CM | POA: Diagnosis not present

## 2021-04-02 DIAGNOSIS — E039 Hypothyroidism, unspecified: Secondary | ICD-10-CM | POA: Diagnosis not present

## 2021-04-02 DIAGNOSIS — R109 Unspecified abdominal pain: Secondary | ICD-10-CM

## 2021-04-02 DIAGNOSIS — Z96653 Presence of artificial knee joint, bilateral: Secondary | ICD-10-CM | POA: Insufficient documentation

## 2021-04-02 LAB — COMPREHENSIVE METABOLIC PANEL
ALT: 18 U/L (ref 0–44)
AST: 18 U/L (ref 15–41)
Albumin: 4.1 g/dL (ref 3.5–5.0)
Alkaline Phosphatase: 54 U/L (ref 38–126)
Anion gap: 7 (ref 5–15)
BUN: 14 mg/dL (ref 8–23)
CO2: 26 mmol/L (ref 22–32)
Calcium: 9.3 mg/dL (ref 8.9–10.3)
Chloride: 103 mmol/L (ref 98–111)
Creatinine, Ser: 1 mg/dL (ref 0.61–1.24)
GFR, Estimated: >60 mL/min (ref >60)
Glucose, Bld: 99 mg/dL (ref 70–99)
Potassium: 4.4 mmol/L (ref 3.5–5.1)
Sodium: 136 mmol/L (ref 135–145)
Total Bilirubin: 1 mg/dL (ref 0.3–1.2)
Total Protein: 7.2 g/dL (ref 6.5–8.1)

## 2021-04-02 LAB — URINALYSIS, ROUTINE W REFLEX MICROSCOPIC
Bilirubin Urine: NEGATIVE
Glucose, UA: NEGATIVE mg/dL
Hgb urine dipstick: NEGATIVE
Ketones, ur: NEGATIVE mg/dL
Leukocytes,Ua: NEGATIVE
Nitrite: NEGATIVE
Protein, ur: NEGATIVE mg/dL
Specific Gravity, Urine: 1.015 (ref 1.005–1.030)
pH: 5 (ref 5.0–8.0)

## 2021-04-02 LAB — CBC
HCT: 48.1 % (ref 39.0–52.0)
Hemoglobin: 16.2 g/dL (ref 13.0–17.0)
MCH: 30.3 pg (ref 26.0–34.0)
MCHC: 33.7 g/dL (ref 30.0–36.0)
MCV: 89.9 fL (ref 80.0–100.0)
Platelets: 158 10*3/uL (ref 150–400)
RBC: 5.35 MIL/uL (ref 4.22–5.81)
RDW: 13.3 % (ref 11.5–15.5)
WBC: 6 10*3/uL (ref 4.0–10.5)
nRBC: 0 % (ref 0.0–0.2)

## 2021-04-02 LAB — LIPASE, BLOOD: Lipase: 40 U/L (ref 11–51)

## 2021-04-02 MED ORDER — ACETAMINOPHEN 500 MG PO TABS
1000.0000 mg | ORAL_TABLET | Freq: Once | ORAL | Status: AC
  Administered 2021-04-02: 1000 mg via ORAL
  Filled 2021-04-02: qty 2

## 2021-04-02 MED ORDER — MORPHINE SULFATE (PF) 4 MG/ML IV SOLN
4.0000 mg | Freq: Once | INTRAVENOUS | Status: AC
Start: 2021-04-02 — End: 2021-04-02
  Administered 2021-04-02: 4 mg via INTRAVENOUS
  Filled 2021-04-02: qty 1

## 2021-04-02 MED ORDER — METHOCARBAMOL 750 MG PO TABS: 750.0000 mg | ORAL_TABLET | Freq: Three times a day (TID) | ORAL | 0 refills | Status: DC | PRN

## 2021-04-02 MED ORDER — ONDANSETRON HCL 4 MG/2ML IJ SOLN
4.0000 mg | Freq: Once | INTRAMUSCULAR | Status: AC
  Administered 2021-04-02: 4 mg via INTRAVENOUS
  Filled 2021-04-02: qty 2

## 2021-04-02 NOTE — Discharge Instructions (Addendum)
It was our pleasure to provide your ER care today - we hope that you feel better.  Overall, your lab tests and imaging studies look good. Degenerative changes are noted in the back/spine area - it is possible you are having a nerve impingement or neuritis type of pain. Take acetaminophen as need for pain. You may also try robaxin as need for relief of muscle pain/spasm - no driving when taking.   Follow up with primary care doctor in 1-2 weeks.   Return to ER if worse, new symptoms, fevers, worsening/severe pain, or other emergency concern.

## 2021-04-02 NOTE — ED Provider Notes (Signed)
[facility] EMERGENCY DEPARTMENT Provider Note   CSN: [id number] Arrival date & time: 04/02/21  1728     History Chief Complaint  Patient presents with  . Abdominal Pain    Brad Woods is a 75 y.o. male.  Patient c/o right flank/RLQ pain for the past 2 weeks. Symptoms acute onset, moderate, persistent, waxing/waning, dull, non radiating, similar to prior kidney stone pain (notes hx '15 prior stones'). Denies hx gallstones. No scrotal or testicular pain. No hematuria or dysuria. No nausea, vomiting or diarrhea. No abd distension or constipation.   The history is provided by the patient and the spouse.  Abdominal Pain Associated symptoms: no chest pain, no diarrhea, no dysuria, no fever, no hematuria, no shortness of breath, no sore throat and no vomiting        Past Medical History:  Diagnosis Date  . Allergy   . Arthritis   . Asthma    when younger  . GERD (gastroesophageal reflux disease)   . Headache   . Hyperlipidemia   . Hypertension   . Kidney stones   . Myocardial infarction ([place])   . Sleep apnea    cpap    Patient Active Problem List   Diagnosis Date Noted  . 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 Morbid obesity ([place]) 07/27/2020  . Dizziness 06/22/2020  . Exertional dyspnea   . Chronic pain of left knee 09/02/2018  . Chronic pain of right knee 09/02/2018  . History of bilateral knee replacement 09/02/2018  . Allergic rhinitis 08/15/2018  . Erectile dysfunction 08/15/2018  . History of renal stone 08/15/2018  . Migraine 08/15/2018  . Osteoarthritis 08/15/2018  . Hyperlipidemia 08/15/2018  . PVD (peripheral vascular disease) ([place]) 08/15/2018  . Left shoulder pain 07/30/2018  . Chest pain 07/30/2018  . Trochanteric bursitis, left hip 05/02/2017  . Carpal tunnel syndrome, left upper limb 05/02/2017  . Trochanteric bursitis, right hip 03/18/2017  . Mixed hyperlipidemia 12/31/2016  . Coronary artery disease involving native coronary  artery of native heart with angina pectoris ([place]) 11/25/2016  . Coronary artery disease involving native coronary artery of native heart without angina pectoris 11/20/2016  . Elevated troponin   . Unstable angina ([place]) 05/18/2016  . Acute coronary syndrome ([place]) 05/18/2016  . Benign non-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 10/12/2014  . Essential hypertension 10/12/2014  . 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 10/12/2014  . High risk medication use 10/12/2014  . IFG (impaired fasting glucose) 10/12/2014  . OSA (obstructive sleep apnea) 02/04/2014  . Hypothyroidism 03/02/2013  . GERD (gastroesophageal reflux disease) 09/04/2012    Past Surgical History:  Procedure Laterality Date  . CARDIAC CATHETERIZATION N/A 05/20/2016   Procedure: Left Heart Cath and Coronary Angiography;  Surgeon: Lorretta Harp, MD;  Location: [facility];  Service: Cardiovascular;  Laterality: N/A;  . CARDIAC CATHETERIZATION N/A 11/22/2016   Procedure: Left Heart Cath and Coronary Angiography;  Surgeon: Nelva Bush, MD;  Location: [facility];  Service: Cardiovascular;  Laterality: N/A;  . COLONOSCOPY    . EYE SURGERY    . LEFT HEART CATH AND CORONARY ANGIOGRAPHY N/A 05/31/2020   Procedure: LEFT HEART CATH AND CORONARY ANGIOGRAPHY;  Surgeon: Troy Sine, MD;  Location: [facility];  Service: Cardiovascular;  Laterality: N/A;  . RECTAL SURGERY    . REPLACEMENT TOTAL KNEE Bilateral        Family History  Problem Relation Age of Onset  . Colon cancer Mother 38  Social History   Tobacco Use  . Smoking status: Never Smoker  . Smokeless tobacco: Never Used  Vaping Use  . Vaping Use: Never used  Substance Use Topics  . Alcohol use: Yes    Alcohol/week: 0.0 standard drinks    Comment: occasionally  . Drug use: No    Home Medications Prior to Admission medications   Medication Sig Start Date End Date Taking? Authorizing Provider  ADVAIR HFA 115-21 MCG/ACT  inhaler Inhale 2 puffs into the lungs 2 (two) times daily as needed. 07/13/20   [provider]  albuterol (PROVENTIL HFA;VENTOLIN HFA) 108 (90 Base) MCG/ACT inhaler Inhale 2 puffs into the lungs every 6 (six) hours as needed for wheezing or shortness of breath. 11/20/16   Nahser, Wonda Cheng, MD  aspirin EC 81 MG EC tablet Take 1 tablet (81 mg total) by mouth daily. 05/22/16   Dixie Dials, MD  carvedilol (COREG) 12.5 MG tablet Take 1 tablet (12.5 mg total) by mouth 2 (two) times daily. 07/13/20   Nahser, Wonda Cheng, MD  clopidogrel (PLAVIX) 75 MG tablet Take 1 tablet (75 mg total) by mouth daily. 07/13/20   Nahser, Wonda Cheng, MD  Evolocumab (REPATHA SURECLICK) 034 MG/ML SOAJ INJECT 1 PEN INTO THE SKIN EVERY 14 (FOURTEEN) DAYS. 07/14/20   Nahser, Wonda Cheng, MD  finasteride (PROSCAR) 5 MG tablet Take 5 mg by mouth daily.    [provider]  fluticasone (FLONASE) 50 MCG/ACT nasal spray Place 2 sprays into the nose daily as needed for allergies.  12/07/13   [provider]  isosorbide mononitrate (IMDUR) 30 MG 24 hr tablet Take 1 tablet (30 mg total) by mouth daily. 07/27/20   Nahser, Wonda Cheng, MD  meclizine (ANTIVERT) 25 MG tablet Take 1 tablet (25 mg total) by mouth 3 (three) times daily as needed for dizziness. 06/22/20   Gareth Morgan, MD  Multiple Vitamins-Minerals (CENTRUM SILVER 50+MEN PO) Take 1 tablet by mouth every other day.     [provider]  nitroGLYCERIN (NITROSTAT) 0.4 MG SL tablet Place 1 tablet (0.4 mg total) under the tongue every 5 (five) minutes as needed for chest pain. 07/13/20   Nahser, Wonda Cheng, MD  ondansetron (ZOFRAN) 4 MG tablet Take 1 tablet (4 mg total) by mouth every 6 (six) hours as needed for nausea. 06/23/20   Georgette Shell, MD  tamsulosin (FLOMAX) 0.4 MG CAPS capsule Take 0.4 mg by mouth at bedtime.  08/14/16   [provider]    Allergies    Hydromorphone hcl, Hydromorphone, and Aleve [naproxen sodium]  Review of Systems    Review of Systems  Constitutional: Negative for fever.  HENT: Negative for sore throat.   Eyes: Negative for redness.  Respiratory: Negative for shortness of breath.   Cardiovascular: Negative for chest pain.  Gastrointestinal: Positive for abdominal pain. Negative for diarrhea and vomiting.  Genitourinary: Positive for flank pain. Negative for dysuria and hematuria.  Musculoskeletal: Negative for neck pain.  Skin: Negative for rash.  Neurological: Negative for headaches.  Hematological: Does not bruise/bleed easily.  Psychiatric/Behavioral: Negative for confusion.    Physical Exam Updated Vital Signs BP (!) 168/80   Pulse 64   Temp 97.7 F (36.5 C) (Oral)   Resp 18   SpO2 100%   Physical Exam Vitals and nursing note reviewed.  Constitutional:      Appearance: Normal appearance. He is well-developed.  HENT:     Head: Atraumatic.     Nose: Nose normal.  Mouth/Throat:     Mouth: Mucous membranes are moist.     Pharynx: Oropharynx is clear.  Eyes:     General: No scleral icterus.    Conjunctiva/sclera: Conjunctivae normal.  Neck:     Trachea: No tracheal deviation.  Cardiovascular:     Rate and Rhythm: Normal rate and regular rhythm.     Pulses: Normal pulses.     Heart sounds: Normal heart sounds. No murmur heard. No friction rub. No gallop.   Pulmonary:     Effort: Pulmonary effort is normal. No accessory muscle usage or respiratory distress.     Breath sounds: Normal breath sounds.  Abdominal:     General: Bowel sounds are normal. There is no distension.     Palpations: Abdomen is soft. There is no mass.     Tenderness: There is no abdominal tenderness. There is no guarding or rebound.     Hernia: No hernia is present.  Genitourinary:    Comments: No cva tenderness. Musculoskeletal:        General: No swelling.     Cervical back: Normal range of motion and neck supple. No rigidity.  Skin:    General: Skin is warm and dry.     Findings: No rash.      Comments: No shingles/rash in area of pain  Neurological:     Mental Status: He is alert.     Comments: Alert, speech clear.   Psychiatric:        Mood and Affect: Mood normal.     ED Results / Procedures / Treatments   Labs (all labs ordered are listed, but only abnormal results are displayed) Results for orders placed or performed during the hospital encounter of 04/02/21  Lipase, blood  Result Value Ref Range   Lipase 40 11 - 51 U/L  Comprehensive metabolic panel  Result Value Ref Range   Sodium 136 135 - 145 mmol/L   Potassium 4.4 3.5 - 5.1 mmol/L   Chloride 103 98 - 111 mmol/L   CO2 26 22 - 32 mmol/L   Glucose, Bld 99 70 - 99 mg/dL   BUN 14 8 - 23 mg/dL   Creatinine, Ser 1.00 0.61 - 1.24 mg/dL   Calcium 9.3 8.9 - 10.3 mg/dL   Total Protein 7.2 6.5 - 8.1 g/dL   Albumin 4.1 3.5 - 5.0 g/dL   AST 18 15 - 41 U/L   ALT 18 0 - 44 U/L   Alkaline Phosphatase 54 38 - 126 U/L   Total Bilirubin 1.0 0.3 - 1.2 mg/dL   GFR, Estimated >60 >60 mL/min   Anion gap 7 5 - 15  CBC  Result Value Ref Range   WBC 6.0 4.0 - 10.5 K/uL   RBC 5.35 4.22 - 5.81 MIL/uL   Hemoglobin 16.2 13.0 - 17.0 g/dL   HCT 48.1 39.0 - 52.0 %   MCV 89.9 80.0 - 100.0 fL   MCH 30.3 26.0 - 34.0 pg   MCHC 33.7 30.0 - 36.0 g/dL   RDW 13.3 11.5 - 15.5 %   Platelets 158 150 - 400 K/uL   nRBC 0.0 0.0 - 0.2 %  Urinalysis, Routine w reflex microscopic Urine, Clean Catch  Result Value Ref Range   Color, Urine YELLOW YELLOW   APPearance CLEAR CLEAR   Specific Gravity, Urine 1.015 1.005 - 1.030   pH 5.0 5.0 - 8.0   Glucose, UA NEGATIVE NEGATIVE mg/dL   Hgb urine dipstick NEGATIVE NEGATIVE   Bilirubin Urine  NEGATIVE NEGATIVE   Ketones, ur NEGATIVE NEGATIVE mg/dL   Protein, ur NEGATIVE NEGATIVE mg/dL   Nitrite NEGATIVE NEGATIVE   Leukocytes,Ua NEGATIVE NEGATIVE    EKG None  Radiology CT Renal Stone Study  Result Date: 04/02/2021 CLINICAL DATA:  75 year old with a personal history of urinary tract calculi,  presenting with a 1 week history of RIGHT flank pain. EXAM: CT ABDOMEN AND PELVIS WITHOUT CONTRAST TECHNIQUE: Multidetector CT imaging of the abdomen and pelvis was performed following the standard protocol without IV contrast. COMPARISON:  05/23/2005 from [facility]. FINDINGS: Lower chest: Mild peripheral interstitial disease involving the visualized lung bases, RIGHT greater than LEFT. Heart size normal with three-vessel coronary atherosclerosis. Hepatobiliary: Normal unenhanced appearance of the liver. Gallbladder normal in appearance without calcified gallstones. No biliary ductal dilation. Pancreas: 1 Normal unenhanced appearance. Spleen: Normal unenhanced appearance. Adrenals/Urinary Tract: Normal appearing adrenal glands. Benign cyst arising from the mid RIGHT kidney posteriorly measuring approximately 5.2 x 5.6 cm, increased in size since the remote CT. Within the limits of the unenhanced technique, no significant focal parenchymal abnormality involving either kidney. No evidence of hydronephrosis involving either kidney. No urinary tract calculi. Normal appearing urinary bladder. Stomach/Bowel: Stomach normal in appearance for the degree of distention. Normal-appearing small bowel. Extensive sigmoid colon diverticulosis without evidence of acute diverticulitis. Mobile cecum positioned in the RIGHT UPPER QUADRANT adjacent to the liver. Normal appearing very small, decompressed appendix in the RIGHT mid abdomen. Vascular/Lymphatic: Moderate aorto-iliac atherosclerosis without evidence of aneurysm. No pathologic lymphadenopathy. Reproductive: Normal sized prostate gland containing calcifications. Normal seminal vesicles. Other: Very small umbilical hernia containing fat. Musculoskeletal: Degenerative disc disease and spondylosis involving the lower thoracic spine. Diffuse facet degenerative changes involving the lumbar spine. Degenerative changes involving the sacroiliac joints. No acute findings.  IMPRESSION: 1. No acute abnormalities involving the abdomen or pelvis. Specifically, no evidence of urinary tract calculi. 2. Extensive sigmoid colon diverticulosis without evidence of acute diverticulitis. 3. Aortic Atherosclerosis (ICD10-I70.0). Electronically Signed   By: Evangeline Dakin M.D.   On: 04/02/2021 20:47    Procedures Procedures   Medications Ordered in ED Medications  morphine 4 MG/ML injection 4 mg (has no administration in time range)  ondansetron (ZOFRAN) injection 4 mg (has no administration in time range)    ED Course  I have reviewed the triage vital signs and the nursing notes.  Pertinent labs & imaging results that were available during my care of the patient were reviewed by me and considered in my medical decision making (see chart for details).    MDM Rules/Calculators/A&P                         Iv ns. Labs sent. Imaging ordered.   Reviewed nursing notes and prior charts for additional history.   Labs reviewed/interpreted by me  - UA neg, wbc normal.  CT reviewed/interpreted by me - no acute intrab process. Note made of degenerative changes in back - ?possible nerve impingement/neuritis as cause of pain as patient does note pain wraps around right lower flank. Spine non tender, aligned. No shingles/rash in area of pain.   Acetaminophen po.   Recheck abd soft nt.   Pt currently appears stable for d/c.   Rec pcp f/u.        Final Clinical Impression(s) / ED Diagnoses Final diagnoses:  None    Rx / DC Orders ED Discharge Orders    None       Lajean Saver, MD  04/02/21 2103  

## 2021-04-02 NOTE — ED Triage Notes (Signed)
RLQ pain x 1 week.  Denies nausea, vomiting, diarrhea, fever, and chills.

## 2021-05-01 DIAGNOSIS — N400 Enlarged prostate without lower urinary tract symptoms: Secondary | ICD-10-CM | POA: Diagnosis not present

## 2021-05-01 DIAGNOSIS — K219 Gastro-esophageal reflux disease without esophagitis: Secondary | ICD-10-CM | POA: Diagnosis not present

## 2021-05-01 DIAGNOSIS — I2510 Atherosclerotic heart disease of native coronary artery without angina pectoris: Secondary | ICD-10-CM | POA: Diagnosis not present

## 2021-05-01 DIAGNOSIS — I10 Essential (primary) hypertension: Secondary | ICD-10-CM | POA: Diagnosis not present

## 2021-05-01 DIAGNOSIS — J45909 Unspecified asthma, uncomplicated: Secondary | ICD-10-CM | POA: Diagnosis not present

## 2021-05-01 DIAGNOSIS — E7800 Pure hypercholesterolemia, unspecified: Secondary | ICD-10-CM | POA: Diagnosis not present

## 2021-05-01 DIAGNOSIS — M1990 Unspecified osteoarthritis, unspecified site: Secondary | ICD-10-CM | POA: Diagnosis not present

## 2021-06-09 ENCOUNTER — Other Ambulatory Visit: Payer: Self-pay | Admitting: Cardiovascular Disease

## 2021-06-14 ENCOUNTER — Other Ambulatory Visit: Payer: Self-pay | Admitting: Cardiovascular Disease

## 2021-06-15 DIAGNOSIS — I2510 Atherosclerotic heart disease of native coronary artery without angina pectoris: Secondary | ICD-10-CM | POA: Diagnosis not present

## 2021-06-15 DIAGNOSIS — I10 Essential (primary) hypertension: Secondary | ICD-10-CM | POA: Diagnosis not present

## 2021-06-15 DIAGNOSIS — K219 Gastro-esophageal reflux disease without esophagitis: Secondary | ICD-10-CM | POA: Diagnosis not present

## 2021-06-15 DIAGNOSIS — J45909 Unspecified asthma, uncomplicated: Secondary | ICD-10-CM | POA: Diagnosis not present

## 2021-06-15 DIAGNOSIS — M1990 Unspecified osteoarthritis, unspecified site: Secondary | ICD-10-CM | POA: Diagnosis not present

## 2021-06-15 DIAGNOSIS — N400 Enlarged prostate without lower urinary tract symptoms: Secondary | ICD-10-CM | POA: Diagnosis not present

## 2021-06-15 DIAGNOSIS — E7800 Pure hypercholesterolemia, unspecified: Secondary | ICD-10-CM | POA: Diagnosis not present

## 2021-06-18 DIAGNOSIS — S61203A Unspecified open wound of left middle finger without damage to nail, initial encounter: Secondary | ICD-10-CM | POA: Diagnosis not present

## 2021-06-18 DIAGNOSIS — Z23 Encounter for immunization: Secondary | ICD-10-CM | POA: Diagnosis not present

## 2021-06-27 DIAGNOSIS — J069 Acute upper respiratory infection, unspecified: Secondary | ICD-10-CM | POA: Diagnosis not present

## 2021-07-23 DIAGNOSIS — E7800 Pure hypercholesterolemia, unspecified: Secondary | ICD-10-CM | POA: Diagnosis not present

## 2021-07-23 DIAGNOSIS — M1990 Unspecified osteoarthritis, unspecified site: Secondary | ICD-10-CM | POA: Diagnosis not present

## 2021-07-23 DIAGNOSIS — N400 Enlarged prostate without lower urinary tract symptoms: Secondary | ICD-10-CM | POA: Diagnosis not present

## 2021-07-23 DIAGNOSIS — K219 Gastro-esophageal reflux disease without esophagitis: Secondary | ICD-10-CM | POA: Diagnosis not present

## 2021-07-23 DIAGNOSIS — I10 Essential (primary) hypertension: Secondary | ICD-10-CM | POA: Diagnosis not present

## 2021-07-23 DIAGNOSIS — I2510 Atherosclerotic heart disease of native coronary artery without angina pectoris: Secondary | ICD-10-CM | POA: Diagnosis not present

## 2021-07-23 DIAGNOSIS — J45909 Unspecified asthma, uncomplicated: Secondary | ICD-10-CM | POA: Diagnosis not present

## 2021-07-28 ENCOUNTER — Other Ambulatory Visit: Payer: Self-pay

## 2021-07-28 ENCOUNTER — Encounter: Payer: Self-pay | Admitting: Cardiovascular Disease

## 2021-07-28 ENCOUNTER — Ambulatory Visit: Payer: PPO | Admitting: Cardiovascular Disease

## 2021-07-28 VITALS — BP 138/72 | HR 59 | Ht 74.0 in | Wt 293.4 lb

## 2021-07-28 DIAGNOSIS — G720 Drug-induced myopathy: Secondary | ICD-10-CM

## 2021-07-28 DIAGNOSIS — E782 Mixed hyperlipidemia: Secondary | ICD-10-CM | POA: Diagnosis not present

## 2021-07-28 DIAGNOSIS — I2510 Atherosclerotic heart disease of native coronary artery without angina pectoris: Secondary | ICD-10-CM

## 2021-07-28 DIAGNOSIS — I10 Essential (primary) hypertension: Secondary | ICD-10-CM | POA: Diagnosis not present

## 2021-07-28 NOTE — Patient Instructions (Signed)

## 2021-07-28 NOTE — Progress Notes (Signed)
Cardiology Office Note   Date:  07/28/2021   ID:  Brad Woods, DOB 11-29-1946, MRN [id number]  PCP:  Joycelyn Rua, MD  Cardiologist:   Kristeen Miss, MD   Problem list 1.  Coronary artery disease-status post stenting 11/17 2.  Chronic diastolic congestive heart failure  Chief Complaint  Patient presents with   Coronary Artery Disease     Brad Woods is a 75 y.o. male who presents for follow up of his CAD.   Seen with wife , [name]  Has been having CP since May. Went to the [facility] , was sent to the hospital May 18, 2016 Was admitted,    At 2 AM , his CP worsened acutely .   Had a stent to his LCx.   ( 90% - 0%)   He wanted to change cardiologist and presents for further management .   Has been having CP similar to his presenting symptom in May. Seem to be worse with exertion, better with rest.   Had another episode of CP last night. Mid sternal CP, radiation down his left arm .  Took a SL NTG  - CP was relieved with NTG .   Has had some shortness of breath - possibly shortly after taking the Brilinta   Also has some belching relieves the CP    does not get any regular exercise. Before the MI, was going several times a week   [name]. 8, 2018:  [name] had his heart catheterization since her last visit. He was found have moderate disease. His stents are patent. He did not require any additional stenting. He was changed from Brilinta to Plavix because of shortness of breath. He seems to be feeling a lot better after that medicine change. Wants to get back into cardiac rehab   May 13, 2017:  Brad Woods is seen for follow up of his CAD. We changed the Brilinta to plavix which helped his dyspnea Rare episodes of CP Has indigestion frequently ,  Belches seem to relieve his CP on occasion .  Exercises regularly  Wt Readings from Last 3 Encounters:  07/28/21 293 lb 6.4 oz (133.1 kg)  07/27/20 (!) 306 lb 6.4 oz (139 kg)  06/22/20 (!) 300 lb 0.7 oz (136.1 kg)   Wt is  down 7 lbs since January .   November 11, 2017:  Brad Woods is seen today for follow-up visit. Hx of CAD, hyperlipidemia,  Stopped atorvastatin 2-3 weeks ago due due to diffuse muscle aches and pains.  He is feeling much better since stopping the atorvastatin. BP looks good today  Had some atypical CP , related to indigestion / gas pains  Getting some exercise.  Lots of ortho issues.   January 08, 2019:  Brad Woods seen today for further evaluation of his hypertension. Wt today is 306 lbs  He measures his blood pressure at home.  He has been getting lots of high readings.  His blood pressure is normal here in the office today.  Does not exercise as much as he would like . Has knee issues  Sees Dr. Magnus Ivan  Needs to lose weight   No CP or angina   July 27, 2020:  Brad Woods is seen today for follow-up of his coronary artery disease.  He status post stenting in the past.  He also has a history of hyperlipidemia, obesity.  Has a history of hypertension.  His blood pressure has been fairly well controlled. Weight today is 306 pounds.  Is having  swimmy headedness.   Was having vertigo Is lightheaded.  Feels like he is on a boat.   Is worse going from seated to standing  Went to the ER with dizziness on June 30.   Head MRI in the ER was unremarkable Was not orthostatic   Heart catheterization on May 31, 2020 reveals moderate diffuse disease.  He has previously placed stent in the proximal circumflex artery was widely patent.  There were no critical lesions that needed any other intervention.  He ran out of Losartan  -    Aug. 5, 2022:  Brad Woods is seen today for follow up of his CAD , obesity, HTN, HLD  No CP ,   has DOe , chronic dyspnea. Wt = 293 Advised continued weight loss. Needs to exercise more  Lab from April 8 look good.  LDL is 6   Past Medical History:  Diagnosis Date   Allergy    Arthritis    Asthma    when younger   GERD (gastroesophageal reflux disease)    Headache     Hyperlipidemia    Hypertension    Kidney stones    Myocardial infarction [name])    Sleep apnea    cpap    Past Surgical History:  Procedure Laterality Date   CARDIAC CATHETERIZATION N/A 05/20/2016   Procedure: Left Heart Cath and Coronary Angiography;  Surgeon: Runell Gess, MD;  Location: [facility] INVASIVE [facility] LAB;  Service: Cardiovascular;  Laterality: N/A;   CARDIAC CATHETERIZATION N/A 11/22/2016   Procedure: Left Heart Cath and Coronary Angiography;  Surgeon: Yvonne Kendall, MD;  Location[facility] INVASIVE [facility] LAB;  Service: Cardiovascular;  Laterality: N/A;   COLONOSCOPY     EYE SURGERY     LEFT HEART CATH AND CORONARY ANGIOGRAPHY N/A 05/31/2020   Procedure: LEFT HEART CATH AND CORONARY ANGIOGRAPHY;  Surgeon: Lennette Bihari, MD;  Location: [facility] INVASIVE CV LAB;  Service: Cardiovascular;  Laterality: N/A;   RECTAL SURGERY     REPLACEMENT TOTAL KNEE Bilateral      Current Outpatient Medications  Medication Sig Dispense Refill   ADVAIR HFA 115-21 MCG/ACT inhaler Inhale 2 puffs into the lungs 2 (two) times daily as needed.     albuterol (PROVENTIL HFA;VENTOLIN HFA) 108 (90 Base) MCG/ACT inhaler Inhale 2 puffs into the lungs every 6 (six) hours as needed for wheezing or shortness of breath. 1 Inhaler 2   aspirin EC 81 MG EC tablet Take 1 tablet (81 mg total) by mouth daily.     carvedilol (COREG) 12.5 MG tablet TAKE ONE TABLET BY MOUTH TWICE DAILY 180 tablet 3   clopidogrel (PLAVIX) 75 MG tablet Take 1 tablet (75 mg total) by mouth daily. 90 tablet 3   finasteride (PROSCAR) 5 MG tablet Take 5 mg by mouth daily.     fluticasone (FLONASE) 50 MCG/ACT nasal spray Place 2 sprays into the nose daily as needed for allergies.      isosorbide mononitrate (IMDUR) 30 MG 24 hr tablet Take 1 tablet (30 mg total) by mouth daily. 90 tablet 3   meclizine (ANTIVERT) 25 MG tablet Take 1 tablet (25 mg total) by mouth 3 (three) times daily as needed for dizziness. 30 tablet 0   methocarbamol (ROBAXIN) 750 MG tablet  Take 1 tablet (750 mg total) by mouth 3 (three) times daily as needed (muscle spasm/pain). 15 tablet 0   Multiple Vitamins-Minerals (CENTRUM SILVER 50+MEN PO) Take 1 tablet by mouth every other day.      nitroGLYCERIN (  NITROSTAT) 0.4 MG SL tablet Place 1 tablet (0.4 mg total) under the tongue every 5 (five) minutes as needed for chest pain. 25 tablet 3   ondansetron (ZOFRAN) 4 MG tablet Take 1 tablet (4 mg total) by mouth every 6 (six) hours as needed for nausea. 20 tablet 0   REPATHA SURECLICK 140 MG/ML SOAJ INJECT 1 PEN INTO THE SKIN EVERY 14 (FOURTEEN) DAYS. 6 mL 3   tamsulosin (FLOMAX) 0.4 MG CAPS capsule Take 0.4 mg by mouth at bedtime.      No current facility-administered medications for this visit.    Allergies:   Hydromorphone hcl, Hydromorphone, and Aleve [naproxen sodium]    Social History:  The patient  reports that he has never smoked. He has never used smokeless tobacco. He reports current alcohol use. He reports that he does not use drugs.   Family History:  The patient's family history includes Colon cancer (age of onset: 45) in his mother.    ROS:  Please see the history of present illness.   Noted in the current history.  All other systems are negative.Marland Kitchen    Physical Exam: Blood pressure 138/72, pulse (!) 59, height 6\' 2"  (1.88 m), weight 293 lb 6.4 oz (133.1 kg), SpO2 97 %.  GEN:  middle age male,  morbidly obese  HEENT: Normal NECK: No JVD; No carotid bruits LYMPHATICS: No lymphadenopathy CARDIAC: RRR , no murmurs, rubs, gallops RESPIRATORY:  Clear to auscultation without rales, wheezing or rhonchi  ABDOMEN: Soft, non-tender, non-distended MUSCULOSKELETAL:  No edema; No deformity  SKIN: Warm and dry NEUROLOGIC:  Alert and oriented x 3   EKG: July 28, 2021: Sinus bradycardia 59.  Right bundle branch block.   Recent Labs: 04/02/2021: ALT 18; BUN 14; Creatinine, Ser 1.00; Hemoglobin 16.2; Platelets 158; Potassium 4.4; Sodium 136    Lipid Panel     Component Value Date/Time   CHOL 114 01/07/2019 0806   TRIG 159 (H) 01/07/2019 0806   HDL 34 (L) 01/07/2019 0806   CHOLHDL 3.4 01/07/2019 0806   CHOLHDL 4.0 11/20/2016 1643   VLDL 31 (H) 11/20/2016 1643   LDLCALC 48 01/07/2019 0806      Wt Readings from Last 3 Encounters:  07/28/21 293 lb 6.4 oz (133.1 kg)  07/27/20 (!) 306 lb 6.4 oz (139 kg)  06/22/20 (!) 300 lb 0.7 oz (136.1 kg)      Other studies Reviewed: Additional studies/ records that were reviewed today include: . Review of the above records demonstrates:    ASSESSMENT AND PLAN:  1.  CAD :     Heart catheterization from June, 2021 reveals moderate diffuse coronary artery disease.  He has a 75% stenosis of the apical LAD.  This may be causing some of his symptoms but he says that he only has chest pain rarely.  We will continue aspirin, Plavix, isosorbide.  I have advised him to let me know if he has any worsening episodes of chest pain.  I have advised him to exercise more and to continue with weight loss program.   2. Hyperlipidemia:    Currently on Repatha.  His last lipids reveal an LDL of 67.  Continue current medications.  3. Morbid obesity :      I encouraged continued weight loss.  4.  Hypertension: Blood pressure is well controlled.  Current medicines are reviewed at length with the patient today.  The patient does not have concerns regarding medicines.  Labs/ tests ordered today include:  No orders of  the defined types were placed in this encounter.    Disposition:   FU with  Me in 6 month     Kristeen Miss, MD  07/28/2021 8:36 AM    [facility] [address] [place], [place], [place]  [address] Phone: [phone]; Fax: [phone]

## 2021-09-04 ENCOUNTER — Other Ambulatory Visit: Payer: Self-pay | Admitting: Cardiovascular Disease

## 2021-09-07 DIAGNOSIS — K219 Gastro-esophageal reflux disease without esophagitis: Secondary | ICD-10-CM | POA: Diagnosis not present

## 2021-09-07 DIAGNOSIS — I2510 Atherosclerotic heart disease of native coronary artery without angina pectoris: Secondary | ICD-10-CM | POA: Diagnosis not present

## 2021-09-07 DIAGNOSIS — M1990 Unspecified osteoarthritis, unspecified site: Secondary | ICD-10-CM | POA: Diagnosis not present

## 2021-09-07 DIAGNOSIS — I10 Essential (primary) hypertension: Secondary | ICD-10-CM | POA: Diagnosis not present

## 2021-09-07 DIAGNOSIS — E7800 Pure hypercholesterolemia, unspecified: Secondary | ICD-10-CM | POA: Diagnosis not present

## 2021-09-07 DIAGNOSIS — N400 Enlarged prostate without lower urinary tract symptoms: Secondary | ICD-10-CM | POA: Diagnosis not present

## 2021-09-07 DIAGNOSIS — J45909 Unspecified asthma, uncomplicated: Secondary | ICD-10-CM | POA: Diagnosis not present

## 2021-10-03 DIAGNOSIS — U071 COVID-19: Secondary | ICD-10-CM | POA: Diagnosis not present

## 2021-11-08 DIAGNOSIS — I2510 Atherosclerotic heart disease of native coronary artery without angina pectoris: Secondary | ICD-10-CM | POA: Diagnosis not present

## 2021-11-08 DIAGNOSIS — M1990 Unspecified osteoarthritis, unspecified site: Secondary | ICD-10-CM | POA: Diagnosis not present

## 2021-11-08 DIAGNOSIS — N400 Enlarged prostate without lower urinary tract symptoms: Secondary | ICD-10-CM | POA: Diagnosis not present

## 2021-11-08 DIAGNOSIS — K219 Gastro-esophageal reflux disease without esophagitis: Secondary | ICD-10-CM | POA: Diagnosis not present

## 2021-11-08 DIAGNOSIS — E7800 Pure hypercholesterolemia, unspecified: Secondary | ICD-10-CM | POA: Diagnosis not present

## 2021-11-08 DIAGNOSIS — I10 Essential (primary) hypertension: Secondary | ICD-10-CM | POA: Diagnosis not present

## 2021-11-08 DIAGNOSIS — J45909 Unspecified asthma, uncomplicated: Secondary | ICD-10-CM | POA: Diagnosis not present

## 2021-12-04 DIAGNOSIS — E7800 Pure hypercholesterolemia, unspecified: Secondary | ICD-10-CM | POA: Diagnosis not present

## 2021-12-04 DIAGNOSIS — M1990 Unspecified osteoarthritis, unspecified site: Secondary | ICD-10-CM | POA: Diagnosis not present

## 2021-12-04 DIAGNOSIS — I2510 Atherosclerotic heart disease of native coronary artery without angina pectoris: Secondary | ICD-10-CM | POA: Diagnosis not present

## 2021-12-04 DIAGNOSIS — K219 Gastro-esophageal reflux disease without esophagitis: Secondary | ICD-10-CM | POA: Diagnosis not present

## 2021-12-04 DIAGNOSIS — J45909 Unspecified asthma, uncomplicated: Secondary | ICD-10-CM | POA: Diagnosis not present

## 2021-12-04 DIAGNOSIS — I10 Essential (primary) hypertension: Secondary | ICD-10-CM | POA: Diagnosis not present

## 2021-12-04 DIAGNOSIS — N400 Enlarged prostate without lower urinary tract symptoms: Secondary | ICD-10-CM | POA: Diagnosis not present

## 2021-12-23 IMAGING — MR MR HEAD W/O CM
12 of 13 series · 44 of 48 positions shown · non-contrast
Comparison: CT head 06/22/2020

CLINICAL DATA: Central vertigo

EXAM:
MRI HEAD WITHOUT CONTRAST
TECHNIQUE: Multiplanar, multiecho pulse sequences of the brain and surrounding
structures were obtained without intravenous contrast.

[Series 5: DWI · axial · 3.0mm · 0.88mm/px · z∈[-96,+47]mm · 7 of 104 slices shown (1 of 4)]
[im 1/104]
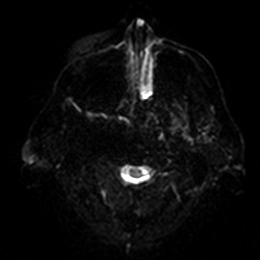
[im 18/104]
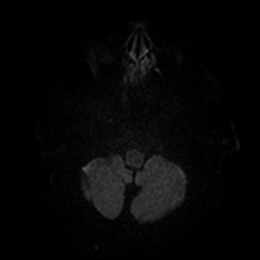
[im 35/104]
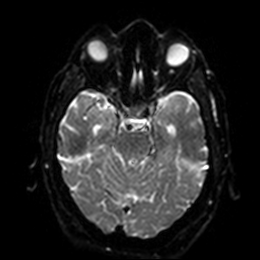
[im 52/104]
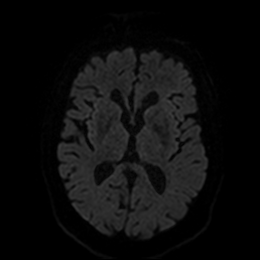
[im 69/104]
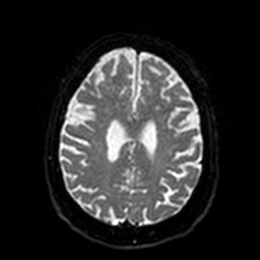
[im 86/104]
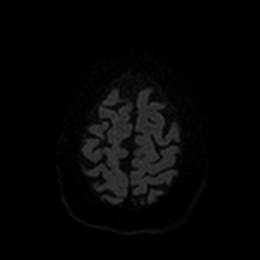
[im 104/104]
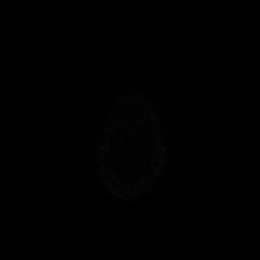

[Series 6: DWI · axial · 3.0mm · 0.88mm/px · z∈[-96,+47]mm · 4 of 52 slices shown (2 of 4)]
[im 1/52]
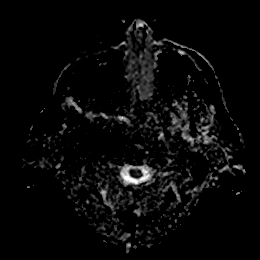
[im 18/52]
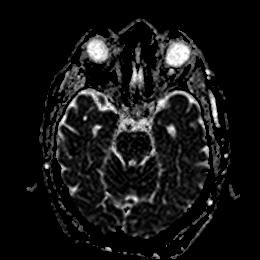
[im 35/52]
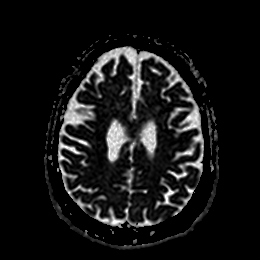
[im 52/52]
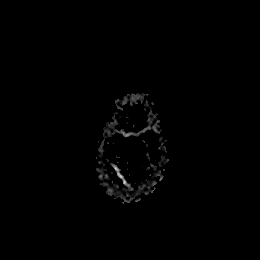

[Series 7: DWI · coronal · 4.0mm · 0.88mm/px · 6 of 80 slices shown (3 of 4)]
[im 1/80]
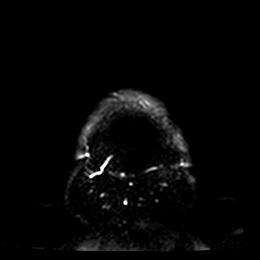
[im 16/80]
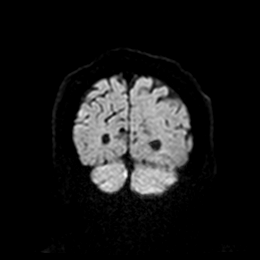
[im 32/80]
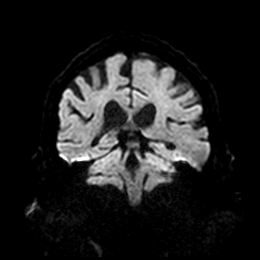
[im 48/80]
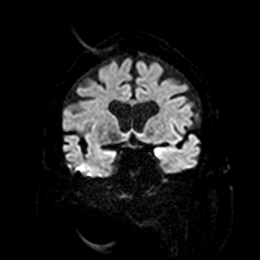
[im 64/80]
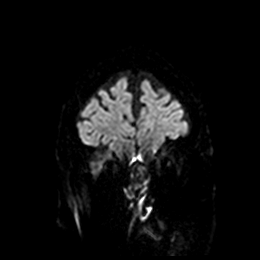
[im 80/80]
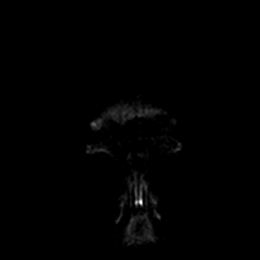

[Series 8: DWI · coronal · 4.0mm · 0.88mm/px · 3 of 40 slices shown (4 of 4)]
[im 1/40]
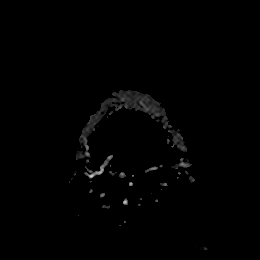
[im 20/40]
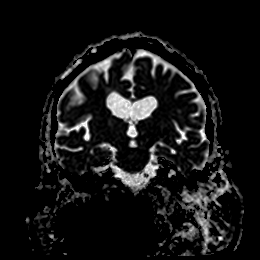
[im 40/40]
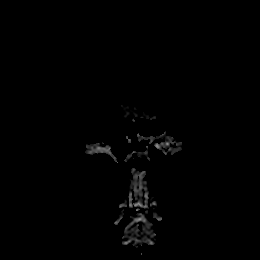

[Series 9: T1 · sagittal · 5.0mm · 0.75mm/px · 2 of 27 slices shown]
[im 1/27]
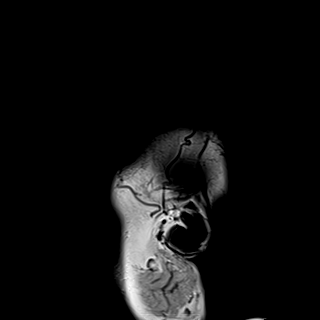
[im 27/27]
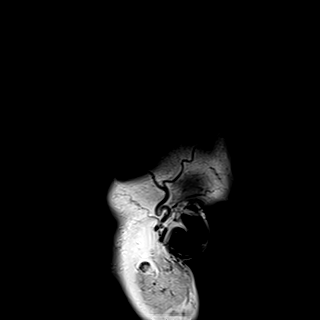

[Series 10: T2 · axial · 5.0mm · 0.90mm/px · z∈[-100,+45]mm · 2 of 27 slices shown (1 of 2)]
[im 1/27]
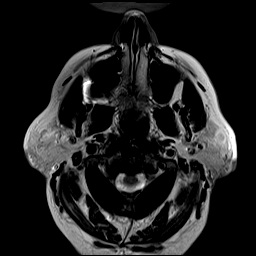
[im 27/27]
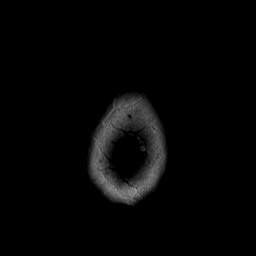

[Series 11: FLAIR · axial · 5.0mm · 0.45mm/px · z∈[-98,+47]mm · 2 of 27 slices shown]
[im 1/27]
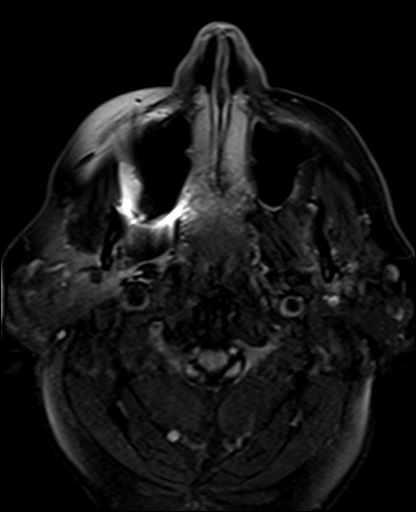
[im 27/27]
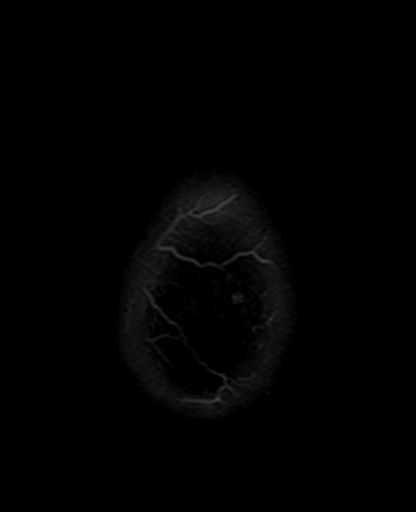

[Series 12: mag_images · axial · 3.0mm · 0.90mm/px · z∈[-92,+39]mm · 4 of 48 slices shown]
[im 1/48]
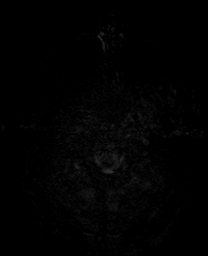
[im 16/48]
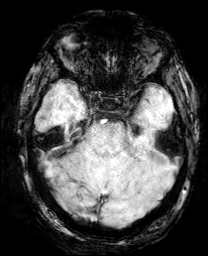
[im 32/48]
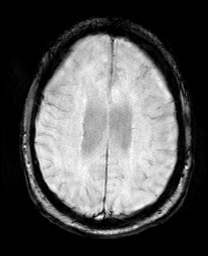
[im 48/48]
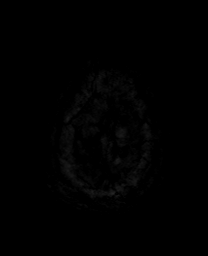

[Series 13: pha_images · axial · 3.0mm · 0.90mm/px · z∈[-89,+39]mm · 4 of 47 slices shown]
[im 1/47]
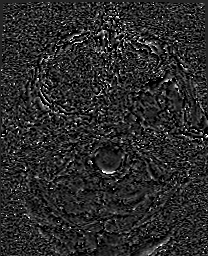
[im 16/47]
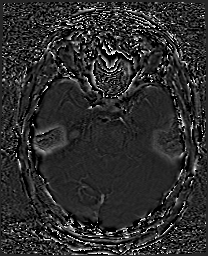
[im 31/47]
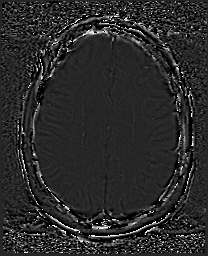
[im 47/47]
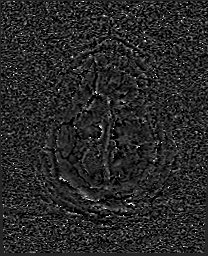

[Series 14: swi_images · axial · 3.0mm · 0.90mm/px · z∈[-92,+39]mm · 4 of 48 slices shown]
[im 1/48]
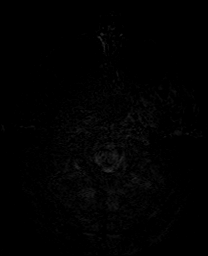
[im 16/48]
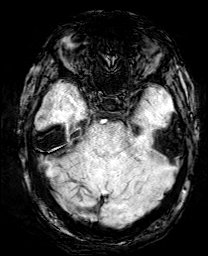
[im 32/48]
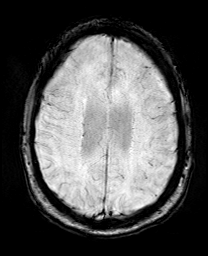
[im 48/48]
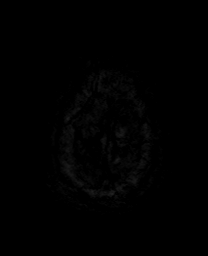

[Series 15: mip_images(sw) · axial · 24.0mm · 0.90mm/px · z∈[-82,+29]mm · 3 of 41 slices shown]
[im 1/41]
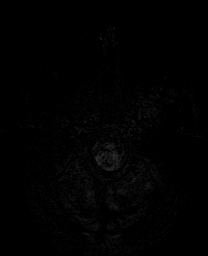
[im 21/41]
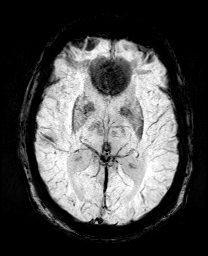
[im 41/41]
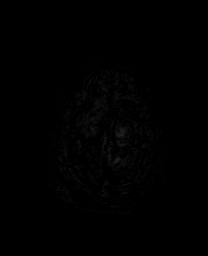

[Series 17: T2 · coronal · 5.0mm · 0.43mm/px · 3 of 33 slices shown (2 of 2)]
[im 1/33]
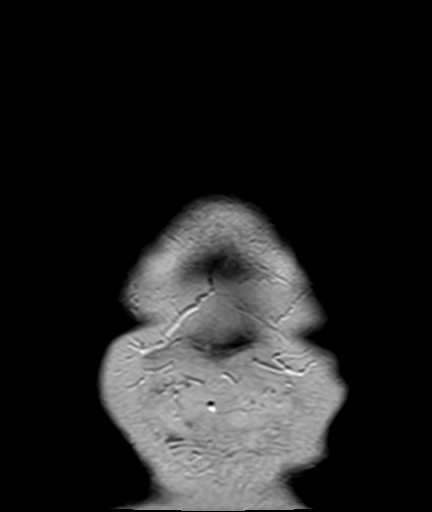
[im 17/33]
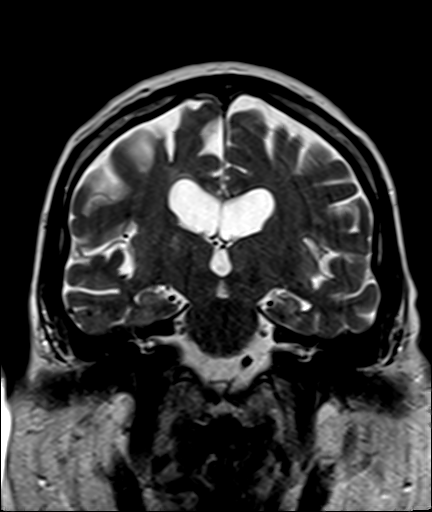
[im 33/33]
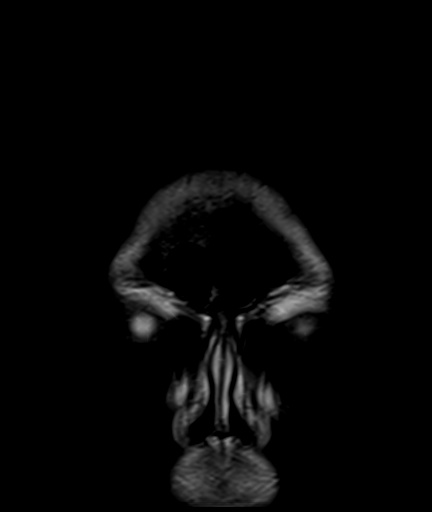

[44 of 48 positions shown; findings below may reference images not displayed]

FINDINGS: Brain: Generalized atrophy with mild ventricular enlargement. Patchy
white matter hyperintensities bilaterally compatible with chronic
ischemia.

Negative for acute infarct, hemorrhage, mass

Vascular: Normal arterial flow voids. Left vertebral artery
dominant.

Skull and upper cervical spine: No focal skeletal lesion.

Sinuses/Orbits: Paranasal sinuses clear.  Negative orbit

Other: None
IMPRESSION: Atrophy and mild chronic microvascular ischemic change. No acute
infarct.

## 2021-12-23 IMAGING — CT CT HEAD W/O CM
4 series · 16 of 47 positions shown, 18 images · non-contrast
Comparison: MRI 12/11/2005

CLINICAL DATA: Vertigo

EXAM:
CT HEAD WITHOUT CONTRAST
TECHNIQUE: Contiguous axial images were obtained from the base of the skull
through the vertex without intravenous contrast.

[Series 3: head without · axial · non-contrast · 0.46mm/px · z∈[-76,+54]mm · 7 of 36 slices shown, 9 images]
[im 5/36  brain]
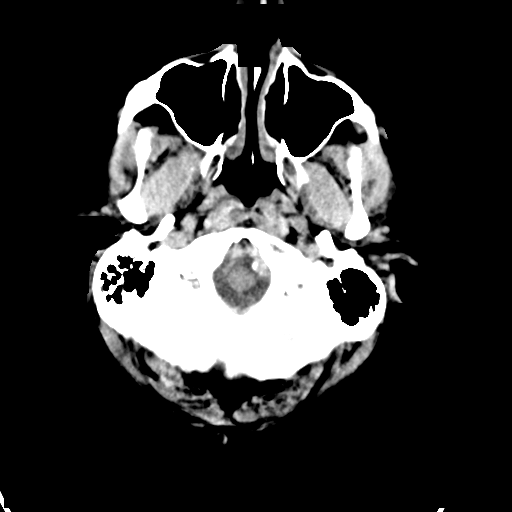
[im 5/36  bone]
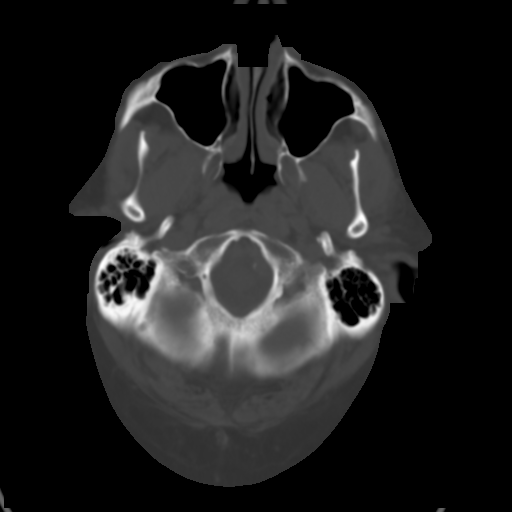
[im 9/36  brain]
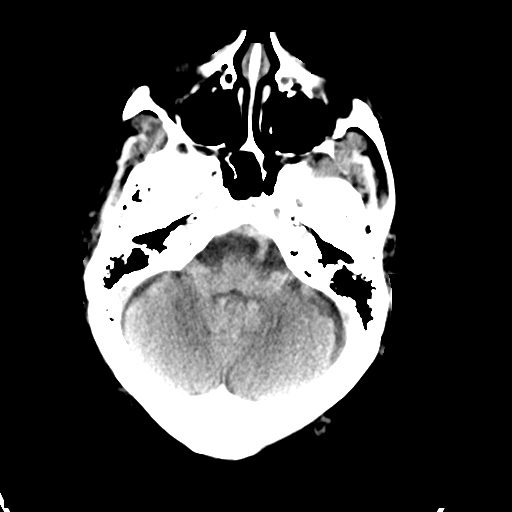
[im 14/36  brain]
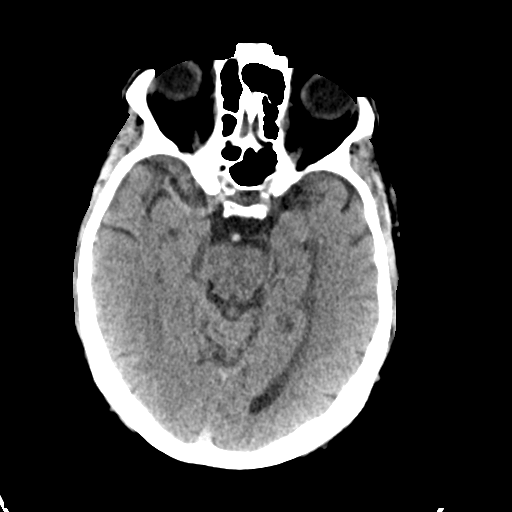
[im 18/36  brain]
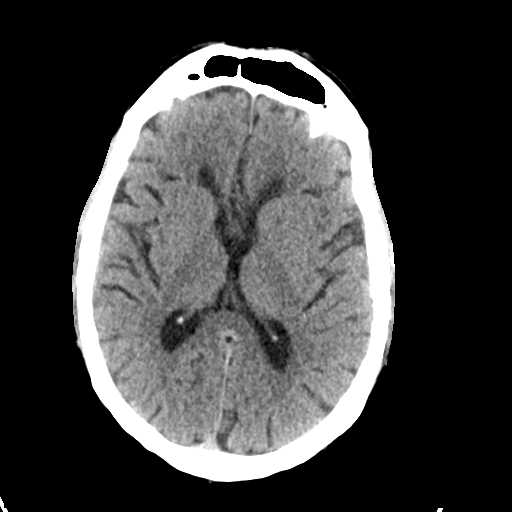
[im 22/36  brain]
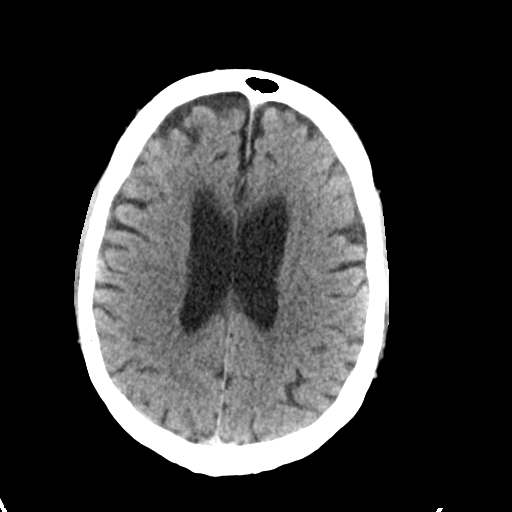
[im 22/36  bone]
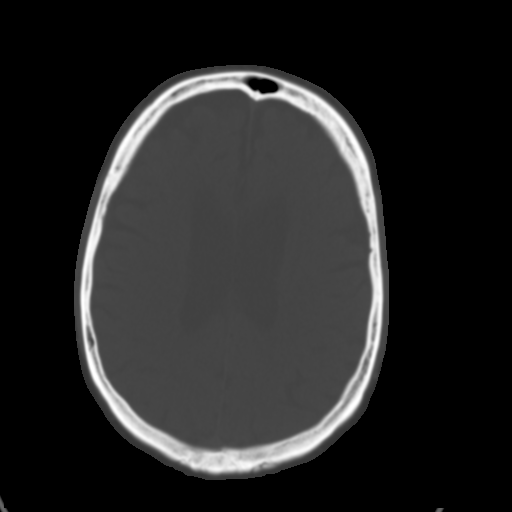
[im 27/36  brain]
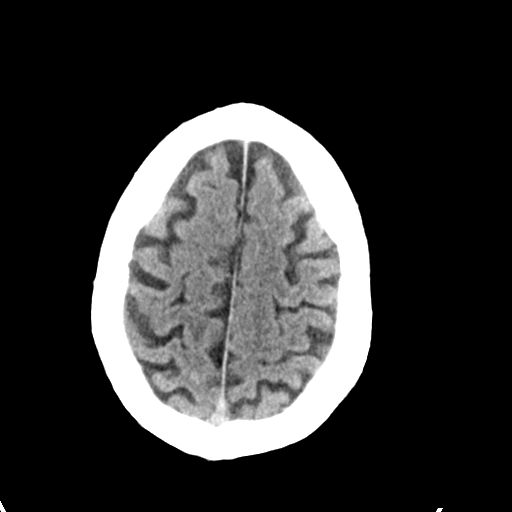
[im 31/36  brain]
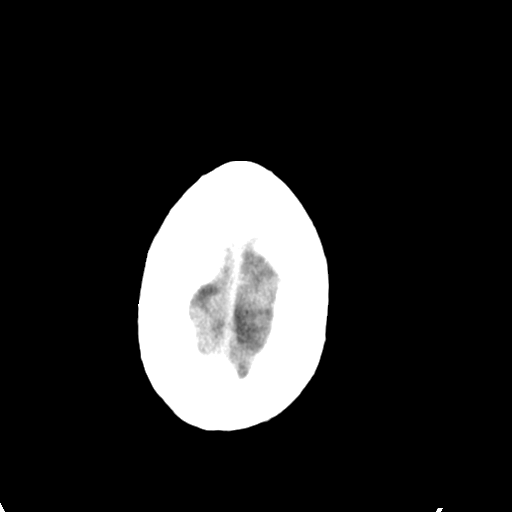

[Series 4: head bone · axial · 0.46mm/px · z∈[-80,-44]mm · 3 of 90 slices shown]
[im 9/90  bone]
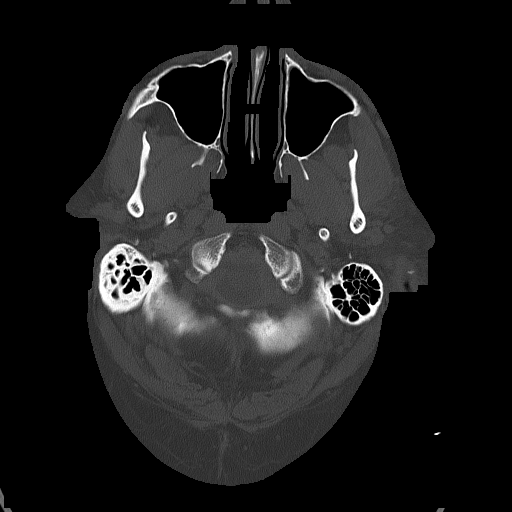
[im 18/90  bone]
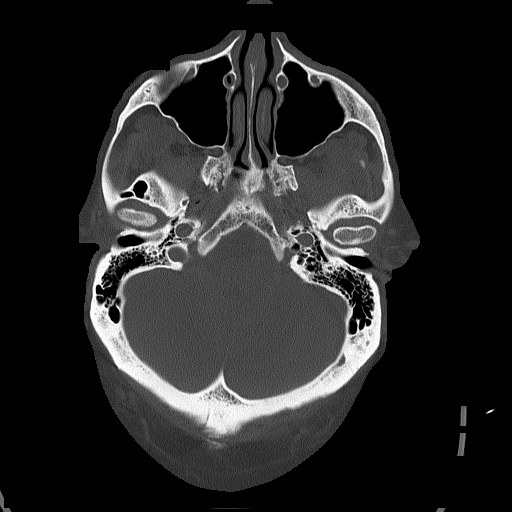
[im 27/90  bone]
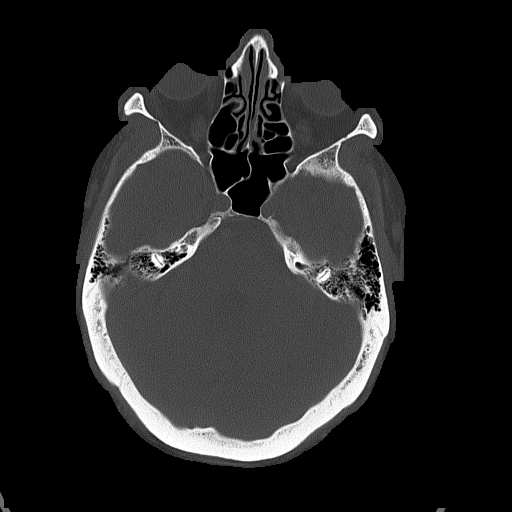

[Series 5: head without cor · coronal · non-contrast · 0.35mm/px · 3 of 74 slices shown]
[im 25/74  brain]
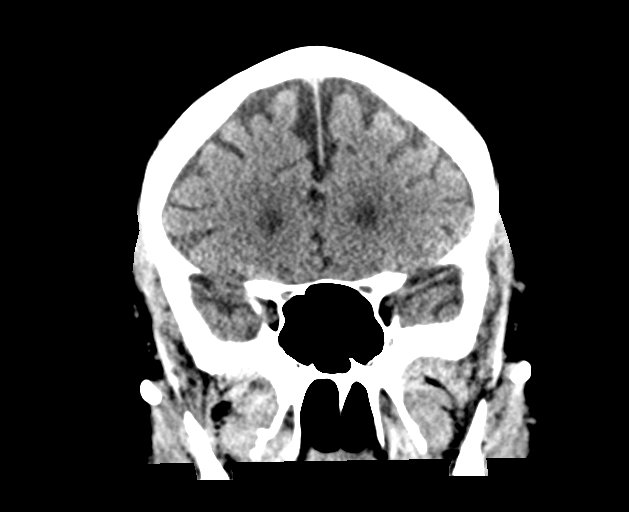
[im 33/74  brain]
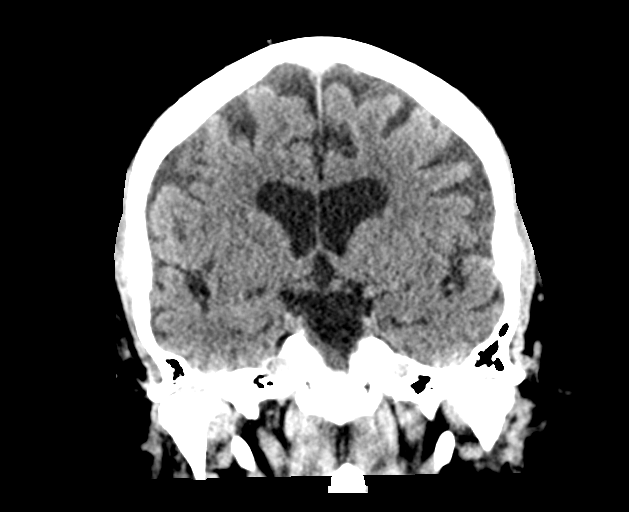
[im 41/74  brain]
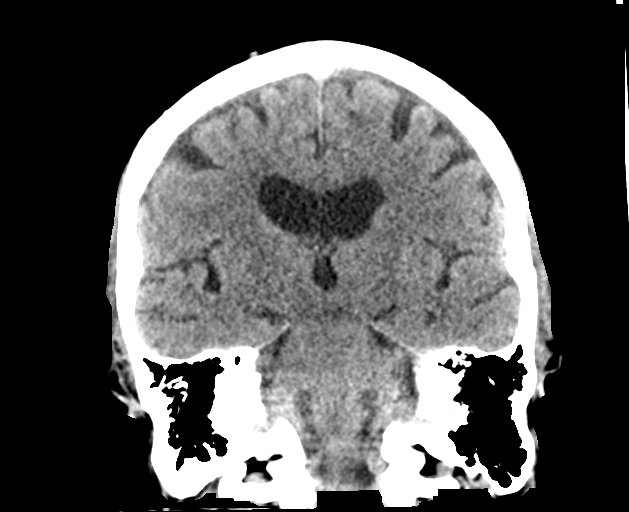

[Series 6: head without sag · sagittal · non-contrast · 0.35mm/px · 3 of 57 slices shown]
[im 19/57  brain]
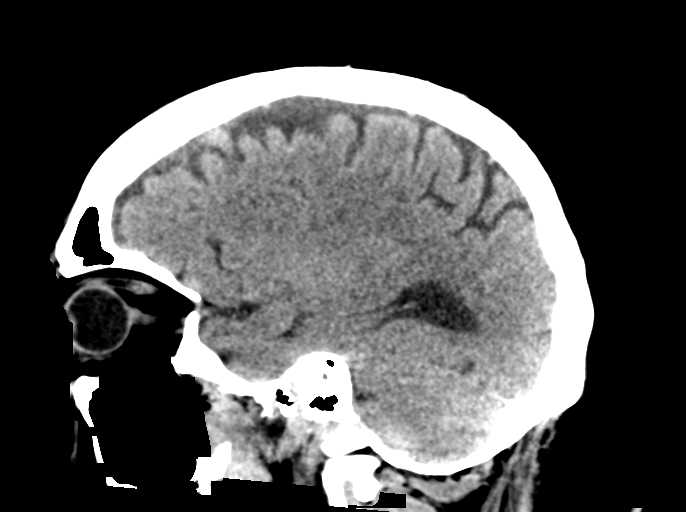
[im 29/57  brain]
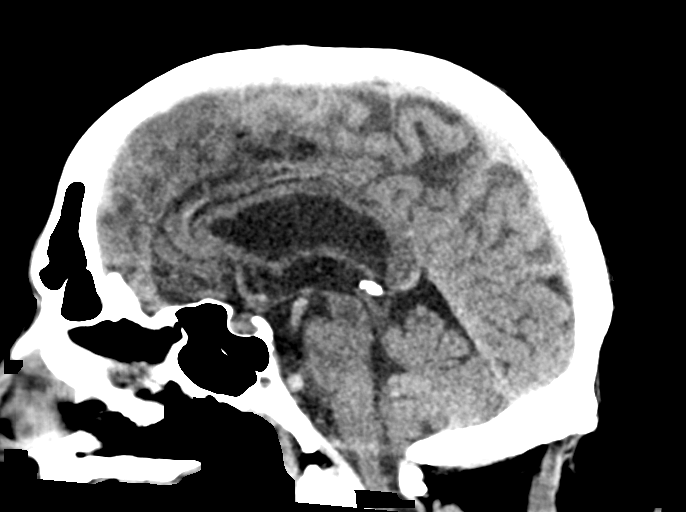
[im 38/57  brain]
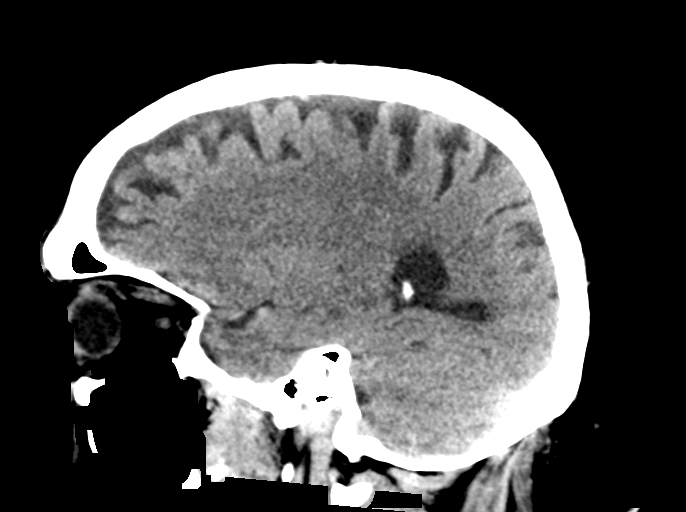

[16 of 47 positions shown; findings below may reference images not displayed]

FINDINGS: Brain: No acute territorial infarction, hemorrhage or intracranial
mass. Mild to moderate atrophy. Slightly prominent ventricles felt
secondary to atrophy.

Vascular: No hyperdense vessels. Scattered carotid vascular
calcification

Skull: Normal. Negative for fracture or focal lesion.

Sinuses/Orbits: No acute finding.

Other: None
IMPRESSION: 1. No CT evidence for acute intracranial abnormality.
2. Atrophy.

## 2022-01-11 ENCOUNTER — Telehealth: Payer: Self-pay | Admitting: *Deleted

## 2022-01-11 DIAGNOSIS — M25562 Pain in left knee: Secondary | ICD-10-CM | POA: Diagnosis not present

## 2022-01-11 DIAGNOSIS — Z96653 Presence of artificial knee joint, bilateral: Secondary | ICD-10-CM | POA: Diagnosis not present

## 2022-01-11 DIAGNOSIS — M25561 Pain in right knee: Secondary | ICD-10-CM | POA: Diagnosis not present

## 2022-01-11 DIAGNOSIS — G8929 Other chronic pain: Secondary | ICD-10-CM | POA: Diagnosis not present

## 2022-01-11 NOTE — Telephone Encounter (Signed)
Pre-operative Risk Assessment    Patient Name: Brad Woods  DOB: July 05, 1946 MRN: [id number]      Request for Surgical Clearance    Procedure:   LEFT PULLEY EXCHANGE/SYNOVECTOMY   Date of Surgery:  Clearance TBD                                 Surgeon:  DR. Georgena Spurling Surgeon's Group or Practice Name:  [facility] Phone number:  [phone] Fax number:  [phone]   Type of Clearance Requested:   - Medical  - Pharmacy:  Hold Aspirin and Clopidogrel (Plavix)     Type of Anesthesia:  Not Indicated (GENERAL?)   Additional requests/questions:    [name]   01/11/2022, 5:39 PM

## 2022-01-12 NOTE — Telephone Encounter (Signed)
Pt has been scheduled to see Coletta Memos, NP, 01/17/2022 at the [facility].  Will route back to the requesting surgeon's office to make them aware.

## 2022-01-12 NOTE — Telephone Encounter (Signed)
Call placed to pt regarding surgical clearance and the need for an appointment.  Left a message for pt to call back and schedule.

## 2022-01-12 NOTE — Telephone Encounter (Signed)
Primary Cardiologist:Philip Nahser, MD  Chart reviewed as part of pre-operative protocol coverage. Because of Brad Woods's past medical history and time since last visit, he/she will require a follow-up visit in order to better assess preoperative cardiovascular risk.  Pre-op covering staff: - Please schedule appointment and call patient to inform them. - Please contact requesting surgeon's office via preferred method (i.e, phone, fax) to inform them of need for appointment prior to surgery.  If applicable, this message will also be routed to pharmacy pool and/or primary cardiologist for input on holding anticoagulant/antiplatelet agent as requested below so that this information is available at time of patient's appointment.   Deberah Pelton, NP  01/12/2022, 9:47 AM

## 2022-01-15 DIAGNOSIS — G720 Drug-induced myopathy: Secondary | ICD-10-CM | POA: Diagnosis not present

## 2022-01-15 DIAGNOSIS — I10 Essential (primary) hypertension: Secondary | ICD-10-CM | POA: Diagnosis not present

## 2022-01-15 DIAGNOSIS — E7800 Pure hypercholesterolemia, unspecified: Secondary | ICD-10-CM | POA: Diagnosis not present

## 2022-01-15 DIAGNOSIS — I2510 Atherosclerotic heart disease of native coronary artery without angina pectoris: Secondary | ICD-10-CM | POA: Diagnosis not present

## 2022-01-15 DIAGNOSIS — J45909 Unspecified asthma, uncomplicated: Secondary | ICD-10-CM | POA: Diagnosis not present

## 2022-01-15 DIAGNOSIS — Z01818 Encounter for other preprocedural examination: Secondary | ICD-10-CM | POA: Diagnosis not present

## 2022-01-15 DIAGNOSIS — M1990 Unspecified osteoarthritis, unspecified site: Secondary | ICD-10-CM | POA: Diagnosis not present

## 2022-01-15 DIAGNOSIS — Z125 Encounter for screening for malignant neoplasm of prostate: Secondary | ICD-10-CM | POA: Diagnosis not present

## 2022-01-15 NOTE — Progress Notes (Signed)
Cardiology Office Note:    Date:  01/15/2022   ID:  Brad Woods, DOB 1946/11/26, MRN [id number]  PCP:  Joycelyn Rua, MD   [facility] Providers Cardiologist:  Kristeen Miss, MD     Referring MD: Joycelyn Rua, MD   He presents to the clinic today for follow-up evaluation of his coronary artery disease and preoperative cardiac evaluation.  History of Present Illness:    Brad Woods is a 76 y.o. male with a hx of coronary artery disease, essential hypertension, hyperlipidemia, mixed hyperlipidemia, morbid obesity, chronic diastolic CHF, and exertional dyspnea.  He was seen by Dr. Elease Hashimoto 07/28/2021.  He underwent cardiac catheterization 05/31/2020 which showed moderate diffuse disease.  He previously had a stent placed in his proximal circumflex which was widely patent.  No critical lesions were identified and medical management was recommended.  During his follow-up visit with Dr. Elease Hashimoto he denied recurrent episodes of chest discomfort, chronic stable dyspnea, his weight was 293 pounds and increase physical activity was recommended.  His previous lab work was reviewed and it showed his LDL was 6.  He presents to the clinic today for follow-up evaluation and preoperative cardiac evaluation.  He states he feels well.  We reviewed his recommendations for holding Plavix.  He is ready to have his bilateral knees redone so he can return to his active lifestyle.  He enjoys going to the gym.  He reports that he does not have any pain in the knees but notices bilateral patellar pain.  He also endorses dietary indiscretion.  His wife is [name] and enjoys cooking.  He enjoys her food.  We reviewed his EKG.  He reports that he ordered a mobile EKG device from [employer].  We reviewed common problems and accuracy with these devices.  He expressed understanding.  I will continue his current medication regimen, have him increase his physical activity as tolerated, continue to work on losing weight,  and plan follow-up 9 to 12 months.  Today he denies chest pain, shortness of breath, lower extremity edema, fatigue, palpitations, melena, hematuria, hemoptysis, diaphoresis, weakness, presyncope, syncope, orthopnea, and PND.   Past Medical History:  Diagnosis Date   Allergy    Arthritis    Asthma    when younger   GERD (gastroesophageal reflux disease)    Headache    Hyperlipidemia    Hypertension    Kidney stones    Myocardial infarction [facility])    Sleep apnea    cpap    Past Surgical History:  Procedure Laterality Date   CARDIAC CATHETERIZATION N/A 05/20/2016   Procedure: Left Heart Cath and Coronary Angiography;  Surgeon: Runell Gess, MD;  Location: [facility] INVASIVE CV LAB;  Service: Cardiovascular;  Laterality: N/A;   CARDIAC CATHETERIZATION N/A 11/22/2016   Procedure: Left Heart Cath and Coronary Angiography;  Surgeon: Yvonne Kendall, MD;  Location: [facility] INVASIVE CV LAB;  Service: Cardiovascular;  Laterality: N/A;   COLONOSCOPY     EYE SURGERY     LEFT HEART CATH AND CORONARY ANGIOGRAPHY N/A 05/31/2020   Procedure: LEFT HEART CATH AND CORONARY ANGIOGRAPHY;  Surgeon: Lennette Bihari, MD;  Location: [facility] INVASIVE CV LAB;  Service: Cardiovascular;  Laterality: N/A;   RECTAL SURGERY     REPLACEMENT TOTAL KNEE Bilateral     Current Medications: No outpatient medications have been marked as taking for the 01/17/22 encounter (Appointment) with Ronney Asters, NP.     Allergies:   Hydromorphone hcl, Hydromorphone, and Aleve [naproxen sodium]  Social History   Socioeconomic History   Marital status: Married    Spouse name: Not on file   Number of children: Not on file   Years of education: Not on file   Highest education level: Not on file  Occupational History   Not on file  Tobacco Use   Smoking status: Never   Smokeless tobacco: Never  Vaping Use   Vaping Use: Never used  Substance and Sexual Activity   Alcohol use: Yes    Alcohol/week: 0.0 standard drinks     Comment: occasionally   Drug use: No   Sexual activity: Yes    Birth control/protection: None  Other Topics Concern   Not on file  Social History Narrative   Not on file   Social Determinants of Health   Financial Resource Strain: Not on file  Food Insecurity: Not on file  Transportation Needs: Not on file  Physical Activity: Not on file  Stress: Not on file  Social Connections: Not on file     Family History: The patient's family history includes Colon cancer (age of onset: 7) in his mother.  ROS:   Please see the history of present illness.     All other systems reviewed and are negative.   Risk Assessment/Calculations:           Physical Exam:    VS:  There were no vitals taken for this visit.    Wt Readings from Last 3 Encounters:  07/28/21 293 lb 6.4 oz (133.1 kg)  07/27/20 (!) 306 lb 6.4 oz (139 kg)  06/22/20 (!) 300 lb 0.7 oz (136.1 kg)     GEN:  Well nourished, well developed in no acute distress HEENT: Normal NECK: No JVD; No carotid bruits LYMPHATICS: No lymphadenopathy CARDIAC: RRR, no murmurs, rubs, gallops RESPIRATORY:  Clear to auscultation without rales, wheezing or rhonchi  ABDOMEN: Soft, non-tender, non-distended MUSCULOSKELETAL:  No edema; No deformity  SKIN: Warm and dry NEUROLOGIC:  Alert and oriented x 3 PSYCHIATRIC:  Normal affect    EKGs/Labs/Other Studies Reviewed:    The following studies were reviewed today:  Echocardiogram 08/11/2020 IMPRESSIONS     1. Left ventricular ejection fraction, by estimation, is 55 to 60%. The  left ventricle has normal function. The left ventricle has no regional  wall motion abnormalities. Left ventricular diastolic parameters are  indeterminate.   2. Right ventricular systolic function is normal. The right ventricular  size is normal. Tricuspid regurgitation signal is inadequate for assessing  PA pressure.   3. The mitral valve is normal in structure. No evidence of mitral valve   regurgitation. No evidence of mitral stenosis.   4. The aortic valve is tricuspid. Aortic valve regurgitation is trivial.  No aortic stenosis is present.   5. The inferior vena cava is normal in size with greater than 50%  respiratory variability, suggesting right atrial pressure of 3 mmHg.   Comparison(s): No significant change from prior study. 05/19/16 EF 60-65%.  Cardiac catheterization 05/31/2020 RPDA-1 lesion is 75% stenosed. RPDA-2 lesion is 50% stenosed. Ost LAD to Prox LAD lesion is 40% stenosed. Prox RCA-1 lesion is 20% stenosed. Non-stenotic Prox RCA-2 lesion. 1st Diag lesion is 40% stenosed. Prox LAD to Mid LAD lesion is 30% stenosed. Non-stenotic Mid LAD-1 lesion. Mid LAD-2 lesion is 50% stenosed. 2nd Diag lesion is 50% stenosed. Dist LAD-1 lesion is 50% stenosed. Dist LAD-2 lesion is 75% stenosed. 1st Mrg-1 lesion is 20% stenosed. 1st Mrg-2 lesion is 20% stenosed. Previously placed  Prox Cx to Mid Cx stent (unknown type) is widely patent.   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  There is diffuse 50% stenoses in the mid LAD second diagonal vessel and focal 75% apical LAD stenosis and small caliber apical segment.  The left circumflex stent is widely patent.  There is 20% narrowing in the OM1 vessel.  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  There is 7075% ostial stenosis of the PDA vessel with diffuse 50% mid stenosis not significantly changed from the 2017 angiographic studies.   Low normal LV function with EF estimated 50 to 55%.  LVEDP 17 mm.   RECOMMENDATION: Patient has been on DAPT therapy with aspirin/Plavix.  Recommend medical therapy for his CAD.  Recommend 2D echo Doppler study for reassessment of systolic as well as diastolic function and valvular  architecture.  Aggressive lipid-lowering therapy with target LDL less than 70 and optimal blood pressure control target less than 130/80.  EKG:  EKG is  ordered today.  The ekg ordered today demonstrates normal sinus rhythm right bundle branch block possible inferior infarct undetermined age 78 bpm  Recent Labs: 04/02/2021: ALT 18; BUN 14; Creatinine, Ser 1.00; Hemoglobin 16.2; Platelets 158; Potassium 4.4; Sodium 136  Recent Lipid Panel    Component Value Date/Time   CHOL 114 01/07/2019 0806   TRIG 159 (H) 01/07/2019 0806   HDL 34 (L) 01/07/2019 0806   CHOLHDL 3.4 01/07/2019 0806   CHOLHDL 4.0 11/20/2016 1643   VLDL 31 (H) 11/20/2016 1643   LDLCALC 48 01/07/2019 0806    ASSESSMENT & PLAN    Coronary artery disease-no recent episodes of arm neck back or chest discomfort.  Status post stenting 11/17 Continue aspirin, Plavix, carvedilol, Imdur, nitroglycerin, Repatha Heart healthy low-sodium diet Increase physical activity as tolerated  Chronic diastolic CHF-euvolemic today.  NYHA class I.  Chronic dyspnea stable. Continue carvedilol Heart healthy low-sodium diet Increase physical activity as tolerated  Essential hypertension-BP today 106/86.  Well-controlled at home. Continue carvedilol, Imdur Heart healthy low-sodium diet-salty 6 given Increase physical activity as tolerated  Hyperlipidemia-LDL 67 03/31/21 Continue Repatha Heart healthy low-sodium high-fiber diet Increase physical activity as tolerated   Preoperative cardiac evaluation-  Dr. Clemetine Marker, fax #[phone]  Chart reviewed as part of pre-operative protocol coverage. Given past medical history and time since last visit, based on ACC/AHA guidelines, Jaedon A Squitieri would be at acceptable risk for the planned procedure without further cardiovascular testing.   His RCRI is a class II risk, 0.9% risk of major cardiac event.  He is able to complete greater than 4 METS of physical activity.  His Plavix may be held  for 5 just 7 days prior to his surgery.  Please resume as soon as hemostasis is achieved.  Patient was advised that if he develops new symptoms prior to surgery to contact our office to arrange a follow-up appointment.  He verbalized understanding.    Disposition: Follow-up with Dr. Elease Hashimoto or APP in 9-12 months.         Medication Adjustments/Labs and Tests Ordered: Current medicines are reviewed at length with the patient today.  Concerns regarding medicines are outlined above.  No orders of the defined types were placed in this encounter.  No orders of the defined types were placed in this encounter.  There are no Patient Instructions on file for this visit.   Signed, Ronney Asters, NP  01/15/2022 11:43 AM      Notice: This dictation was prepared with Dragon dictation along with smaller phrase technology. Any transcriptional errors that result from this process are unintentional and may not be corrected upon review.  I spent 14 minutes examining this patient, reviewing medications, and using patient centered shared decision making involving her cardiac care.  Prior to her visit I spent greater than 20 minutes reviewing her past medical history,  medications, and prior cardiac tests.

## 2022-01-17 ENCOUNTER — Encounter (HOSPITAL_BASED_OUTPATIENT_CLINIC_OR_DEPARTMENT_OTHER): Payer: Self-pay | Admitting: General Practice

## 2022-01-17 ENCOUNTER — Other Ambulatory Visit: Payer: Self-pay

## 2022-01-17 ENCOUNTER — Ambulatory Visit (HOSPITAL_BASED_OUTPATIENT_CLINIC_OR_DEPARTMENT_OTHER): Payer: PPO | Admitting: General Practice

## 2022-01-17 VITALS — BP 106/86 | HR 62 | Ht 74.0 in | Wt 305.6 lb

## 2022-01-17 DIAGNOSIS — I10 Essential (primary) hypertension: Secondary | ICD-10-CM

## 2022-01-17 DIAGNOSIS — Z01810 Encounter for preprocedural cardiovascular examination: Secondary | ICD-10-CM | POA: Diagnosis not present

## 2022-01-17 DIAGNOSIS — I5032 Chronic diastolic (congestive) heart failure: Secondary | ICD-10-CM | POA: Diagnosis not present

## 2022-01-17 DIAGNOSIS — I2510 Atherosclerotic heart disease of native coronary artery without angina pectoris: Secondary | ICD-10-CM

## 2022-01-17 DIAGNOSIS — E782 Mixed hyperlipidemia: Secondary | ICD-10-CM

## 2022-01-17 NOTE — Patient Instructions (Signed)
Medication Instructions:  Your Physician recommend you continue on your current medication as directed.    *If you need a refill on your cardiac medications before your next appointment, please call your pharmacy*   Lab Work: None ordered today   Testing/Procedures: You are cleared for your procedure. Coletta Memos, NP will send your clearance to Dr. Lorre Nick!    Follow-Up: At [facility], you and your health needs are our priority.  As part of our continuing mission to provide you with exceptional heart care, we have created designated Provider Care Teams.  These Care Teams include your primary Cardiologist (physician) and Advanced Practice Providers (APPs -  Physician Assistants and Nurse Practitioners) who all work together to provide you with the care you need, when you need it.  We recommend signing up for the patient portal called "MyChart".  Sign up information is provided on this After Visit Summary.  MyChart is used to connect with patients for Virtual Visits (Telemedicine).  Patients are able to view lab/test results, encounter notes, upcoming appointments, etc.  Non-urgent messages can be sent to your provider as well.   To learn more about what you can do with MyChart, go to [email or web address].    Your next appointment:   9-12 month(s)  The format for your next appointment:   In Person  Provider:   Mertie Moores, MD

## 2022-01-31 DIAGNOSIS — E7800 Pure hypercholesterolemia, unspecified: Secondary | ICD-10-CM | POA: Diagnosis not present

## 2022-01-31 DIAGNOSIS — J45909 Unspecified asthma, uncomplicated: Secondary | ICD-10-CM | POA: Diagnosis not present

## 2022-01-31 DIAGNOSIS — I10 Essential (primary) hypertension: Secondary | ICD-10-CM | POA: Diagnosis not present

## 2022-02-19 ENCOUNTER — Ambulatory Visit: Payer: PPO | Admitting: Cardiovascular Disease

## 2022-03-05 DIAGNOSIS — N400 Enlarged prostate without lower urinary tract symptoms: Secondary | ICD-10-CM | POA: Diagnosis not present

## 2022-03-05 DIAGNOSIS — E7800 Pure hypercholesterolemia, unspecified: Secondary | ICD-10-CM | POA: Diagnosis not present

## 2022-03-05 DIAGNOSIS — J45909 Unspecified asthma, uncomplicated: Secondary | ICD-10-CM | POA: Diagnosis not present

## 2022-03-05 DIAGNOSIS — I10 Essential (primary) hypertension: Secondary | ICD-10-CM | POA: Diagnosis not present

## 2022-04-04 DIAGNOSIS — H31092 Other chorioretinal scars, left eye: Secondary | ICD-10-CM | POA: Diagnosis not present

## 2022-04-04 DIAGNOSIS — H2513 Age-related nuclear cataract, bilateral: Secondary | ICD-10-CM | POA: Diagnosis not present

## 2022-04-04 DIAGNOSIS — H53042 Amblyopia suspect, left eye: Secondary | ICD-10-CM | POA: Diagnosis not present

## 2022-04-04 DIAGNOSIS — H43813 Vitreous degeneration, bilateral: Secondary | ICD-10-CM | POA: Diagnosis not present

## 2022-04-17 DIAGNOSIS — I10 Essential (primary) hypertension: Secondary | ICD-10-CM | POA: Diagnosis not present

## 2022-04-17 DIAGNOSIS — I2510 Atherosclerotic heart disease of native coronary artery without angina pectoris: Secondary | ICD-10-CM | POA: Diagnosis not present

## 2022-04-17 DIAGNOSIS — M1990 Unspecified osteoarthritis, unspecified site: Secondary | ICD-10-CM | POA: Diagnosis not present

## 2022-04-17 DIAGNOSIS — N400 Enlarged prostate without lower urinary tract symptoms: Secondary | ICD-10-CM | POA: Diagnosis not present

## 2022-04-17 DIAGNOSIS — K219 Gastro-esophageal reflux disease without esophagitis: Secondary | ICD-10-CM | POA: Diagnosis not present

## 2022-04-17 DIAGNOSIS — E7800 Pure hypercholesterolemia, unspecified: Secondary | ICD-10-CM | POA: Diagnosis not present

## 2022-04-17 DIAGNOSIS — J45909 Unspecified asthma, uncomplicated: Secondary | ICD-10-CM | POA: Diagnosis not present

## 2022-04-25 DIAGNOSIS — M1990 Unspecified osteoarthritis, unspecified site: Secondary | ICD-10-CM | POA: Diagnosis not present

## 2022-04-25 DIAGNOSIS — J309 Allergic rhinitis, unspecified: Secondary | ICD-10-CM | POA: Diagnosis not present

## 2022-04-25 DIAGNOSIS — E7800 Pure hypercholesterolemia, unspecified: Secondary | ICD-10-CM | POA: Diagnosis not present

## 2022-04-25 DIAGNOSIS — N400 Enlarged prostate without lower urinary tract symptoms: Secondary | ICD-10-CM | POA: Diagnosis not present

## 2022-04-25 DIAGNOSIS — J45909 Unspecified asthma, uncomplicated: Secondary | ICD-10-CM | POA: Diagnosis not present

## 2022-04-25 DIAGNOSIS — K219 Gastro-esophageal reflux disease without esophagitis: Secondary | ICD-10-CM | POA: Diagnosis not present

## 2022-04-25 DIAGNOSIS — I10 Essential (primary) hypertension: Secondary | ICD-10-CM | POA: Diagnosis not present

## 2022-05-01 DIAGNOSIS — Z0000 Encounter for general adult medical examination without abnormal findings: Secondary | ICD-10-CM | POA: Diagnosis not present

## 2022-05-01 DIAGNOSIS — N400 Enlarged prostate without lower urinary tract symptoms: Secondary | ICD-10-CM | POA: Diagnosis not present

## 2022-05-01 DIAGNOSIS — E7800 Pure hypercholesterolemia, unspecified: Secondary | ICD-10-CM | POA: Diagnosis not present

## 2022-05-01 DIAGNOSIS — I2510 Atherosclerotic heart disease of native coronary artery without angina pectoris: Secondary | ICD-10-CM | POA: Diagnosis not present

## 2022-05-01 DIAGNOSIS — I10 Essential (primary) hypertension: Secondary | ICD-10-CM | POA: Diagnosis not present

## 2022-05-09 DIAGNOSIS — Z85828 Personal history of other malignant neoplasm of skin: Secondary | ICD-10-CM | POA: Diagnosis not present

## 2022-05-09 DIAGNOSIS — D225 Melanocytic nevi of trunk: Secondary | ICD-10-CM | POA: Diagnosis not present

## 2022-05-09 DIAGNOSIS — D1801 Hemangioma of skin and subcutaneous tissue: Secondary | ICD-10-CM | POA: Diagnosis not present

## 2022-05-09 DIAGNOSIS — L570 Actinic keratosis: Secondary | ICD-10-CM | POA: Diagnosis not present

## 2022-05-26 ENCOUNTER — Other Ambulatory Visit: Payer: Self-pay | Admitting: Cardiovascular Disease

## 2022-06-04 DIAGNOSIS — I10 Essential (primary) hypertension: Secondary | ICD-10-CM | POA: Diagnosis not present

## 2022-06-04 DIAGNOSIS — E785 Hyperlipidemia, unspecified: Secondary | ICD-10-CM | POA: Diagnosis not present

## 2022-06-04 DIAGNOSIS — I2510 Atherosclerotic heart disease of native coronary artery without angina pectoris: Secondary | ICD-10-CM | POA: Diagnosis not present

## 2022-06-04 DIAGNOSIS — J45909 Unspecified asthma, uncomplicated: Secondary | ICD-10-CM | POA: Diagnosis not present

## 2022-06-17 ENCOUNTER — Encounter: Payer: Self-pay | Admitting: Cardiovascular Disease

## 2022-06-17 NOTE — Progress Notes (Signed)
Cardiology Office Note   Date:  06/18/2022   ID:  Brad Woods, DOB 02-Jul-1946, MRN [id number]  PCP:  Joycelyn Rua, MD  Cardiologist:   Kristeen Miss, MD   Problem list 1.  Coronary artery disease-status post stenting 11/17 2.  Chronic diastolic congestive heart failure  Chief Complaint  Patient presents with   Coronary Artery Disease        Hypertension          [name] is a 76 y.o. male who presents for follow up of his CAD.   Seen with wife , [name]  Has been having CP since May. Went to the [facility] , was sent to the hospital May 18, 2016 Was admitted,    At 2 AM , his CP worsened acutely .   Had a stent to his LCx.   ( 90% - 0%)   He wanted to change cardiologist and presents for further management .   Has been having CP similar to his presenting symptom in May. Seem to be worse with exertion, better with rest.   Had another episode of CP last night. Mid sternal CP, radiation down his left arm .  Took a SL NTG  - CP was relieved with NTG .   Has had some shortness of breath - possibly shortly after taking the Brilinta   Also has some belching relieves the CP    does not get any regular exercise. Before the MI, was going several times a week   [name]. 8, 2018:  Brad Woods had his heart catheterization since her last visit. He was found have moderate disease. His stents are patent. He did not require any additional stenting. He was changed from Brilinta to Plavix because of shortness of breath. He seems to be feeling a lot better after that medicine change. Wants to get back into cardiac rehab   May 13, 2017:  Brad Woods is seen for follow up of his CAD. We changed the Brilinta to plavix which helped his dyspnea Rare episodes of CP Has indigestion frequently ,  Belches seem to relieve his CP on occasion .  Exercises regularly  Wt Readings from Last 3 Encounters:  06/18/22 299 lb (135.6 kg)  01/17/22 (!) 305 lb 9.6 oz (138.6 kg)  07/28/21 293 lb 6.4 oz  (133.1 kg)   Wt is down 7 lbs since January .   November 11, 2017:  Brad Woods is seen today for follow-up visit. Hx of CAD, hyperlipidemia,  Stopped atorvastatin 2-3 weeks ago due due to diffuse muscle aches and pains.  He is feeling much better since stopping the atorvastatin. BP looks good today  Had some atypical CP , related to indigestion / gas pains  Getting some exercise.  Lots of ortho issues.   January 08, 2019:  Brad Woods seen today for further evaluation of his hypertension. Wt today is 306 lbs  He measures his blood pressure at home.  He has been getting lots of high readings.  His blood pressure is normal here in the office today.  Does not exercise as much as he would like . Has knee issues  Sees Dr. Magnus Ivan  Needs to lose weight   No CP or angina   July 27, 2020:  Brad Woods is seen today for follow-up of his coronary artery disease.  He status post stenting in the past.  He also has a history of hyperlipidemia, obesity.  Has a history of hypertension.  His blood pressure has been fairly  well controlled. Weight today is 306 pounds.  Is having swimmy headedness.   Was having vertigo Is lightheaded.  Feels like he is on a boat.   Is worse going from seated to standing  Went to the ER with dizziness on June 30.   Head MRI in the ER was unremarkable Was not orthostatic   Heart catheterization on May 31, 2020 reveals moderate diffuse disease.  He has previously placed stent in the proximal circumflex artery was widely patent.  There were no critical lesions that needed any other intervention.  He ran out of Losartan  -    Aug. 5, 2022:  Brad Woods is seen today for follow up of his CAD , obesity, HTN, HLD  No CP ,   has DOe , chronic dyspnea. Wt = 293 Advised continued weight loss. Needs to exercise more  Lab from April 8 look good.  LDL is 6   June 26 , 2023   Brad Woods is seen today for follow up of his CAD, obesity, HTN, HLD  Wt =  299 lbs  Wants to lose weight  -   Needs to lose weight before he can have knee surgery  Gets out of breath waking to the mailbix Likely has become deconditioned Used to walk the neighborhood I recommended stationary bike      Past Medical History:  Diagnosis Date   Allergy    Arthritis    Asthma    when younger   GERD (gastroesophageal reflux disease)    Headache    Hyperlipidemia    Hypertension    Kidney stones    Myocardial infarction [facility])    Sleep apnea    cpap    Past Surgical History:  Procedure Laterality Date   CARDIAC CATHETERIZATION N/A 05/20/2016   Procedure: Left Heart Cath and Coronary Angiography;  Surgeon: Runell Gess, MD;  Location: [facility] INVASIVE CV LAB;  Service: Cardiovascular;  Laterality: N/A;   CARDIAC CATHETERIZATION N/A 11/22/2016   Procedure: Left Heart Cath and Coronary Angiography;  Surgeon: Yvonne Kendall, MD;  Location: [facility] INVASIVE CV LAB;  Service: Cardiovascular;  Laterality: N/A;   COLONOSCOPY     EYE SURGERY     LEFT HEART CATH AND CORONARY ANGIOGRAPHY N/A 05/31/2020   Procedure: LEFT HEART CATH AND CORONARY ANGIOGRAPHY;  Surgeon: Lennette Bihari, MD;  Location: [facility] INVASIVE CV LAB;  Service: Cardiovascular;  Laterality: N/A;   RECTAL SURGERY     REPLACEMENT TOTAL KNEE Bilateral      Current Outpatient Medications  Medication Sig Dispense Refill   ADVAIR HFA 115-21 MCG/ACT inhaler Inhale 2 puffs into the lungs 2 (two) times daily as needed.     albuterol (PROVENTIL HFA;VENTOLIN HFA) 108 (90 Base) MCG/ACT inhaler Inhale 2 puffs into the lungs every 6 (six) hours as needed for wheezing or shortness of breath. 1 Inhaler 2   aspirin EC 81 MG EC tablet Take 1 tablet (81 mg total) by mouth daily.     carvedilol (COREG) 12.5 MG tablet TAKE ONE TABLET BY MOUTH TWICE DAILY 180 tablet 2   clopidogrel (PLAVIX) 75 MG tablet TAKE ONE TABLET BY MOUTH ONCE DAILY 90 tablet 2   finasteride (PROSCAR) 5 MG tablet Take 5 mg by mouth daily.     fluticasone (FLONASE) 50 MCG/ACT nasal spray  Place 2 sprays into the nose daily as needed for allergies.      isosorbide mononitrate (IMDUR) 30 MG 24 hr tablet TAKE ONE TABLET BY MOUTH ONCE DAILY 90  tablet 3   meclizine (ANTIVERT) 25 MG tablet Take 1 tablet (25 mg total) by mouth 3 (three) times daily as needed for dizziness. 30 tablet 0   methocarbamol (ROBAXIN) 750 MG tablet Take 1 tablet (750 mg total) by mouth 3 (three) times daily as needed (muscle spasm/pain). 15 tablet 0   Multiple Vitamins-Minerals (CENTRUM SILVER 50+MEN PO) Take 1 tablet by mouth every other day.      nitroGLYCERIN (NITROSTAT) 0.4 MG SL tablet Place 1 tablet (0.4 mg total) under the tongue every 5 (five) minutes as needed for chest pain. 25 tablet 3   ondansetron (ZOFRAN) 4 MG tablet Take 1 tablet (4 mg total) by mouth every 6 (six) hours as needed for nausea. 20 tablet 0   REPATHA SURECLICK 140 MG/ML SOAJ INJECT 1 PEN INTO THE SKIN EVERY 14 (FOURTEEN) DAYS. 6 mL 3   tamsulosin (FLOMAX) 0.4 MG CAPS capsule Take 0.4 mg by mouth at bedtime.      No current facility-administered medications for this visit.    Allergies:   Hydromorphone hcl, Hydromorphone, and Aleve [naproxen sodium]    Social History:  The patient  reports that he has never smoked. He has never used smokeless tobacco. He reports current alcohol use. He reports that he does not use drugs.   Family History:  The patient's family history includes Colon cancer (age of onset: 98) in his mother.    ROS:  Please see the history of present illness.   Noted in the current history.  All other systems are negative.Marland Kitchen    Physical Exam: Blood pressure 120/72, pulse 65, height 6\' 2"  (1.88 m), weight 299 lb (135.6 kg), SpO2 97 %.  GEN:  morbidly obese male,  HEENT: Normal NECK: No JVD; No carotid bruits LYMPHATICS: No lymphadenopathy CARDIAC: RRR , no murmurs, rubs, gallops RESPIRATORY:  Clear to auscultation without rales, wheezing or rhonchi  ABDOMEN: Soft, non-tender, non-distended MUSCULOSKELETAL:   No edema; No deformity  SKIN: Warm and dry NEUROLOGIC:  Alert and oriented x 3    EKG:     Recent Labs: No results found for requested labs within last 365 days.    Lipid Panel    Component Value Date/Time   CHOL 114 01/07/2019 0806   TRIG 159 (H) 01/07/2019 0806   HDL 34 (L) 01/07/2019 0806   CHOLHDL 3.4 01/07/2019 0806   CHOLHDL 4.0 11/20/2016 1643   VLDL 31 (H) 11/20/2016 1643   LDLCALC 48 01/07/2019 0806      Wt Readings from Last 3 Encounters:  06/18/22 299 lb (135.6 kg)  01/17/22 (!) 305 lb 9.6 oz (138.6 kg)  07/28/21 293 lb 6.4 oz (133.1 kg)      Other studies Reviewed: Additional studies/ records that were reviewed today include: . Review of the above records demonstrates:    ASSESSMENT AND PLAN:  1.  CAD :     Heart catheterization from June, 2021 reveals moderate diffuse coronary artery disease.  He has a 75% stenosis of the apical LAD.     2. Hyperlipidemia:    Currently on Repatha.     3. Morbid obesity :    Wants to try [facility] or Ozempic.  He states that he is already been approved for medicine.  He is hemoglobin A1c is 5.6.  I will forward the chart to our Pharm.D. clinic to have them evaluate him for the appropriateness of Wegovy.  4.  Hypertension: Blood pressures well controlled  Current medicines are reviewed at length with  the patient today.  The patient does not have concerns regarding medicines.  Labs/ tests ordered today include:   Orders Placed This Encounter  Procedures   AMB Referral to [facility] Pharm[facility]D      Disposition:   FU with  APP or  Me in  1 year     Kristeen Miss, MD  06/18/2022 11:13 AM    [facility] [address] [place], [place], [place]  [address] Phone: [phone]; Fax: [phone]

## 2022-06-18 ENCOUNTER — Ambulatory Visit: Payer: PPO | Admitting: Cardiovascular Disease

## 2022-06-18 ENCOUNTER — Encounter: Payer: Self-pay | Admitting: Cardiovascular Disease

## 2022-06-18 ENCOUNTER — Telehealth: Payer: Self-pay | Admitting: Pharmacist

## 2022-06-18 VITALS — BP 120/72 | HR 65 | Ht 74.0 in | Wt 299.0 lb

## 2022-06-18 DIAGNOSIS — I2510 Atherosclerotic heart disease of native coronary artery without angina pectoris: Secondary | ICD-10-CM | POA: Diagnosis not present

## 2022-06-18 DIAGNOSIS — E782 Mixed hyperlipidemia: Secondary | ICD-10-CM

## 2022-06-27 DIAGNOSIS — M1990 Unspecified osteoarthritis, unspecified site: Secondary | ICD-10-CM | POA: Diagnosis not present

## 2022-06-27 DIAGNOSIS — E669 Obesity, unspecified: Secondary | ICD-10-CM | POA: Diagnosis not present

## 2022-06-27 DIAGNOSIS — G4733 Obstructive sleep apnea (adult) (pediatric): Secondary | ICD-10-CM | POA: Diagnosis not present

## 2022-06-27 DIAGNOSIS — E785 Hyperlipidemia, unspecified: Secondary | ICD-10-CM | POA: Diagnosis not present

## 2022-06-27 DIAGNOSIS — R5383 Other fatigue: Secondary | ICD-10-CM | POA: Diagnosis not present

## 2022-06-27 DIAGNOSIS — Z6841 Body Mass Index (BMI) 40.0 and over, adult: Secondary | ICD-10-CM | POA: Diagnosis not present

## 2022-06-27 DIAGNOSIS — I2510 Atherosclerotic heart disease of native coronary artery without angina pectoris: Secondary | ICD-10-CM | POA: Diagnosis not present

## 2022-06-27 DIAGNOSIS — R0602 Shortness of breath: Secondary | ICD-10-CM | POA: Diagnosis not present

## 2022-06-27 DIAGNOSIS — Z1331 Encounter for screening for depression: Secondary | ICD-10-CM | POA: Diagnosis not present

## 2022-07-10 ENCOUNTER — Ambulatory Visit: Payer: PPO

## 2022-07-11 ENCOUNTER — Ambulatory Visit: Payer: PPO

## 2022-07-11 DIAGNOSIS — M1990 Unspecified osteoarthritis, unspecified site: Secondary | ICD-10-CM | POA: Diagnosis not present

## 2022-07-11 DIAGNOSIS — E785 Hyperlipidemia, unspecified: Secondary | ICD-10-CM | POA: Diagnosis not present

## 2022-07-11 DIAGNOSIS — I2510 Atherosclerotic heart disease of native coronary artery without angina pectoris: Secondary | ICD-10-CM | POA: Diagnosis not present

## 2022-07-11 DIAGNOSIS — Z6841 Body Mass Index (BMI) 40.0 and over, adult: Secondary | ICD-10-CM | POA: Diagnosis not present

## 2022-07-11 DIAGNOSIS — E669 Obesity, unspecified: Secondary | ICD-10-CM | POA: Diagnosis not present

## 2022-08-01 DIAGNOSIS — I2510 Atherosclerotic heart disease of native coronary artery without angina pectoris: Secondary | ICD-10-CM | POA: Diagnosis not present

## 2022-08-01 DIAGNOSIS — M1990 Unspecified osteoarthritis, unspecified site: Secondary | ICD-10-CM | POA: Diagnosis not present

## 2022-08-12 ENCOUNTER — Other Ambulatory Visit: Payer: Self-pay | Admitting: Cardiovascular Disease

## 2022-08-27 ENCOUNTER — Other Ambulatory Visit: Payer: Self-pay | Admitting: Cardiovascular Disease

## 2022-08-28 DIAGNOSIS — K5900 Constipation, unspecified: Secondary | ICD-10-CM | POA: Diagnosis not present

## 2022-08-28 DIAGNOSIS — I2510 Atherosclerotic heart disease of native coronary artery without angina pectoris: Secondary | ICD-10-CM | POA: Diagnosis not present

## 2022-08-28 DIAGNOSIS — M1990 Unspecified osteoarthritis, unspecified site: Secondary | ICD-10-CM | POA: Diagnosis not present

## 2022-09-03 DIAGNOSIS — I10 Essential (primary) hypertension: Secondary | ICD-10-CM | POA: Diagnosis not present

## 2022-09-03 DIAGNOSIS — I2510 Atherosclerotic heart disease of native coronary artery without angina pectoris: Secondary | ICD-10-CM | POA: Diagnosis not present

## 2022-09-03 DIAGNOSIS — M1990 Unspecified osteoarthritis, unspecified site: Secondary | ICD-10-CM | POA: Diagnosis not present

## 2022-09-03 DIAGNOSIS — K219 Gastro-esophageal reflux disease without esophagitis: Secondary | ICD-10-CM | POA: Diagnosis not present

## 2022-09-03 DIAGNOSIS — E785 Hyperlipidemia, unspecified: Secondary | ICD-10-CM | POA: Diagnosis not present

## 2022-09-03 DIAGNOSIS — J45909 Unspecified asthma, uncomplicated: Secondary | ICD-10-CM | POA: Diagnosis not present

## 2022-09-24 DIAGNOSIS — M1990 Unspecified osteoarthritis, unspecified site: Secondary | ICD-10-CM | POA: Diagnosis not present

## 2022-09-24 DIAGNOSIS — I2510 Atherosclerotic heart disease of native coronary artery without angina pectoris: Secondary | ICD-10-CM | POA: Diagnosis not present

## 2022-09-24 DIAGNOSIS — K5900 Constipation, unspecified: Secondary | ICD-10-CM | POA: Diagnosis not present

## 2022-10-17 DIAGNOSIS — K219 Gastro-esophageal reflux disease without esophagitis: Secondary | ICD-10-CM | POA: Diagnosis not present

## 2022-10-17 DIAGNOSIS — I2510 Atherosclerotic heart disease of native coronary artery without angina pectoris: Secondary | ICD-10-CM | POA: Diagnosis not present

## 2022-10-17 DIAGNOSIS — I10 Essential (primary) hypertension: Secondary | ICD-10-CM | POA: Diagnosis not present

## 2022-10-17 DIAGNOSIS — M1990 Unspecified osteoarthritis, unspecified site: Secondary | ICD-10-CM | POA: Diagnosis not present

## 2022-10-17 DIAGNOSIS — N400 Enlarged prostate without lower urinary tract symptoms: Secondary | ICD-10-CM | POA: Diagnosis not present

## 2022-10-17 DIAGNOSIS — E785 Hyperlipidemia, unspecified: Secondary | ICD-10-CM | POA: Diagnosis not present

## 2022-10-23 DIAGNOSIS — I2510 Atherosclerotic heart disease of native coronary artery without angina pectoris: Secondary | ICD-10-CM | POA: Diagnosis not present

## 2022-10-23 DIAGNOSIS — M1990 Unspecified osteoarthritis, unspecified site: Secondary | ICD-10-CM | POA: Diagnosis not present

## 2022-10-23 DIAGNOSIS — K5900 Constipation, unspecified: Secondary | ICD-10-CM | POA: Diagnosis not present

## 2022-10-23 DIAGNOSIS — R42 Dizziness and giddiness: Secondary | ICD-10-CM | POA: Diagnosis not present

## 2022-10-23 DIAGNOSIS — Z6837 Body mass index (BMI) 37.0-37.9, adult: Secondary | ICD-10-CM | POA: Diagnosis not present

## 2022-10-25 DIAGNOSIS — J45909 Unspecified asthma, uncomplicated: Secondary | ICD-10-CM | POA: Diagnosis not present

## 2022-10-25 DIAGNOSIS — E785 Hyperlipidemia, unspecified: Secondary | ICD-10-CM | POA: Diagnosis not present

## 2022-10-25 DIAGNOSIS — I10 Essential (primary) hypertension: Secondary | ICD-10-CM | POA: Diagnosis not present

## 2022-10-25 DIAGNOSIS — K219 Gastro-esophageal reflux disease without esophagitis: Secondary | ICD-10-CM | POA: Diagnosis not present

## 2022-10-25 DIAGNOSIS — M1990 Unspecified osteoarthritis, unspecified site: Secondary | ICD-10-CM | POA: Diagnosis not present

## 2022-10-25 DIAGNOSIS — I2510 Atherosclerotic heart disease of native coronary artery without angina pectoris: Secondary | ICD-10-CM | POA: Diagnosis not present

## 2022-11-26 DIAGNOSIS — K5900 Constipation, unspecified: Secondary | ICD-10-CM | POA: Diagnosis not present

## 2022-11-26 DIAGNOSIS — I2510 Atherosclerotic heart disease of native coronary artery without angina pectoris: Secondary | ICD-10-CM | POA: Diagnosis not present

## 2022-11-26 DIAGNOSIS — M1990 Unspecified osteoarthritis, unspecified site: Secondary | ICD-10-CM | POA: Diagnosis not present

## 2022-11-26 DIAGNOSIS — Z6838 Body mass index (BMI) 38.0-38.9, adult: Secondary | ICD-10-CM | POA: Diagnosis not present

## 2022-11-28 DIAGNOSIS — K219 Gastro-esophageal reflux disease without esophagitis: Secondary | ICD-10-CM | POA: Diagnosis not present

## 2022-11-28 DIAGNOSIS — I10 Essential (primary) hypertension: Secondary | ICD-10-CM | POA: Diagnosis not present

## 2022-11-28 DIAGNOSIS — N400 Enlarged prostate without lower urinary tract symptoms: Secondary | ICD-10-CM | POA: Diagnosis not present

## 2022-11-28 DIAGNOSIS — J45909 Unspecified asthma, uncomplicated: Secondary | ICD-10-CM | POA: Diagnosis not present

## 2022-11-28 DIAGNOSIS — M1990 Unspecified osteoarthritis, unspecified site: Secondary | ICD-10-CM | POA: Diagnosis not present

## 2022-11-28 DIAGNOSIS — E785 Hyperlipidemia, unspecified: Secondary | ICD-10-CM | POA: Diagnosis not present

## 2022-11-28 DIAGNOSIS — I2510 Atherosclerotic heart disease of native coronary artery without angina pectoris: Secondary | ICD-10-CM | POA: Diagnosis not present

## 2023-01-08 DIAGNOSIS — M1990 Unspecified osteoarthritis, unspecified site: Secondary | ICD-10-CM | POA: Diagnosis not present

## 2023-01-08 DIAGNOSIS — Z6837 Body mass index (BMI) 37.0-37.9, adult: Secondary | ICD-10-CM | POA: Diagnosis not present

## 2023-01-08 DIAGNOSIS — I2510 Atherosclerotic heart disease of native coronary artery without angina pectoris: Secondary | ICD-10-CM | POA: Diagnosis not present

## 2023-01-08 DIAGNOSIS — K5900 Constipation, unspecified: Secondary | ICD-10-CM | POA: Diagnosis not present

## 2023-02-07 DIAGNOSIS — K5900 Constipation, unspecified: Secondary | ICD-10-CM | POA: Diagnosis not present

## 2023-02-07 DIAGNOSIS — M1990 Unspecified osteoarthritis, unspecified site: Secondary | ICD-10-CM | POA: Diagnosis not present

## 2023-02-07 DIAGNOSIS — I2510 Atherosclerotic heart disease of native coronary artery without angina pectoris: Secondary | ICD-10-CM | POA: Diagnosis not present

## 2023-02-07 DIAGNOSIS — Z6837 Body mass index (BMI) 37.0-37.9, adult: Secondary | ICD-10-CM | POA: Diagnosis not present

## 2023-02-13 DIAGNOSIS — E785 Hyperlipidemia, unspecified: Secondary | ICD-10-CM | POA: Diagnosis not present

## 2023-02-13 DIAGNOSIS — I2510 Atherosclerotic heart disease of native coronary artery without angina pectoris: Secondary | ICD-10-CM | POA: Diagnosis not present

## 2023-02-13 DIAGNOSIS — J45909 Unspecified asthma, uncomplicated: Secondary | ICD-10-CM | POA: Diagnosis not present

## 2023-02-13 DIAGNOSIS — K219 Gastro-esophageal reflux disease without esophagitis: Secondary | ICD-10-CM | POA: Diagnosis not present

## 2023-02-13 DIAGNOSIS — I10 Essential (primary) hypertension: Secondary | ICD-10-CM | POA: Diagnosis not present

## 2023-02-13 DIAGNOSIS — M1990 Unspecified osteoarthritis, unspecified site: Secondary | ICD-10-CM | POA: Diagnosis not present

## 2023-02-19 DIAGNOSIS — U071 COVID-19: Secondary | ICD-10-CM | POA: Diagnosis not present

## 2023-02-24 ENCOUNTER — Other Ambulatory Visit: Payer: Self-pay | Admitting: Cardiovascular Disease

## 2023-03-12 DIAGNOSIS — I2510 Atherosclerotic heart disease of native coronary artery without angina pectoris: Secondary | ICD-10-CM | POA: Diagnosis not present

## 2023-03-12 DIAGNOSIS — M1990 Unspecified osteoarthritis, unspecified site: Secondary | ICD-10-CM | POA: Diagnosis not present

## 2023-03-12 DIAGNOSIS — K5900 Constipation, unspecified: Secondary | ICD-10-CM | POA: Diagnosis not present

## 2023-03-12 DIAGNOSIS — Z6837 Body mass index (BMI) 37.0-37.9, adult: Secondary | ICD-10-CM | POA: Diagnosis not present

## 2023-03-26 DIAGNOSIS — T887XXA Unspecified adverse effect of drug or medicament, initial encounter: Secondary | ICD-10-CM | POA: Diagnosis not present

## 2023-03-26 DIAGNOSIS — K5909 Other constipation: Secondary | ICD-10-CM | POA: Diagnosis not present

## 2023-03-26 DIAGNOSIS — K648 Other hemorrhoids: Secondary | ICD-10-CM | POA: Diagnosis not present

## 2023-04-08 DIAGNOSIS — H43813 Vitreous degeneration, bilateral: Secondary | ICD-10-CM | POA: Diagnosis not present

## 2023-04-08 DIAGNOSIS — H31091 Other chorioretinal scars, right eye: Secondary | ICD-10-CM | POA: Diagnosis not present

## 2023-04-08 DIAGNOSIS — H5213 Myopia, bilateral: Secondary | ICD-10-CM | POA: Diagnosis not present

## 2023-04-08 DIAGNOSIS — H2513 Age-related nuclear cataract, bilateral: Secondary | ICD-10-CM | POA: Diagnosis not present

## 2023-04-08 DIAGNOSIS — H50012 Monocular esotropia, left eye: Secondary | ICD-10-CM | POA: Diagnosis not present

## 2023-04-08 DIAGNOSIS — H52223 Regular astigmatism, bilateral: Secondary | ICD-10-CM | POA: Diagnosis not present

## 2023-04-08 DIAGNOSIS — H524 Presbyopia: Secondary | ICD-10-CM | POA: Diagnosis not present

## 2023-04-16 DIAGNOSIS — M1990 Unspecified osteoarthritis, unspecified site: Secondary | ICD-10-CM | POA: Diagnosis not present

## 2023-04-16 DIAGNOSIS — K5900 Constipation, unspecified: Secondary | ICD-10-CM | POA: Diagnosis not present

## 2023-04-16 DIAGNOSIS — Z6837 Body mass index (BMI) 37.0-37.9, adult: Secondary | ICD-10-CM | POA: Diagnosis not present

## 2023-04-16 DIAGNOSIS — I2510 Atherosclerotic heart disease of native coronary artery without angina pectoris: Secondary | ICD-10-CM | POA: Diagnosis not present

## 2023-05-21 DIAGNOSIS — M1990 Unspecified osteoarthritis, unspecified site: Secondary | ICD-10-CM | POA: Diagnosis not present

## 2023-05-21 DIAGNOSIS — K5900 Constipation, unspecified: Secondary | ICD-10-CM | POA: Diagnosis not present

## 2023-05-21 DIAGNOSIS — Z6837 Body mass index (BMI) 37.0-37.9, adult: Secondary | ICD-10-CM | POA: Diagnosis not present

## 2023-05-21 DIAGNOSIS — I2510 Atherosclerotic heart disease of native coronary artery without angina pectoris: Secondary | ICD-10-CM | POA: Diagnosis not present

## 2023-05-25 ENCOUNTER — Other Ambulatory Visit: Payer: Self-pay | Admitting: Cardiovascular Disease

## 2023-05-30 ENCOUNTER — Other Ambulatory Visit: Payer: Self-pay | Admitting: Cardiovascular Disease

## 2023-06-19 DIAGNOSIS — I2510 Atherosclerotic heart disease of native coronary artery without angina pectoris: Secondary | ICD-10-CM | POA: Diagnosis not present

## 2023-06-19 DIAGNOSIS — M1990 Unspecified osteoarthritis, unspecified site: Secondary | ICD-10-CM | POA: Diagnosis not present

## 2023-06-19 DIAGNOSIS — Z6836 Body mass index (BMI) 36.0-36.9, adult: Secondary | ICD-10-CM | POA: Diagnosis not present

## 2023-06-19 DIAGNOSIS — K5900 Constipation, unspecified: Secondary | ICD-10-CM | POA: Diagnosis not present

## 2023-07-09 ENCOUNTER — Encounter: Payer: Self-pay | Admitting: Cardiovascular Disease

## 2023-07-09 ENCOUNTER — Other Ambulatory Visit: Payer: Self-pay | Admitting: Cardiovascular Disease

## 2023-07-09 ENCOUNTER — Ambulatory Visit: Payer: PPO | Attending: Cardiovascular Disease | Admitting: Cardiovascular Disease

## 2023-07-09 VITALS — BP 126/64 | HR 80 | Ht 74.0 in | Wt 273.2 lb

## 2023-07-09 DIAGNOSIS — E782 Mixed hyperlipidemia: Secondary | ICD-10-CM | POA: Diagnosis not present

## 2023-07-09 DIAGNOSIS — I2510 Atherosclerotic heart disease of native coronary artery without angina pectoris: Secondary | ICD-10-CM

## 2023-07-09 MED ORDER — ISOSORBIDE MONONITRATE ER 60 MG PO TB24: 60.0000 mg | ORAL_TABLET | Freq: Every day | ORAL | 3 refills | Status: DC

## 2023-07-09 MED ORDER — NITROGLYCERIN 0.4 MG SL SUBL: SUBLINGUAL_TABLET | SUBLINGUAL | 3 refills | Status: AC

## 2023-07-09 NOTE — Patient Instructions (Signed)
Medication Instructions:  INCREASE Isosorbide Mononitrate (Imdur) to 60mg  daily REFILLED Nitroglycerin *If you need a refill on your cardiac medications before your next appointment, please call your pharmacy*   Lab Work: Lipids, ALT, BMET today If you have labs (blood work) drawn today and your tests are completely normal, you will receive your results only by: MyChart Message (if you have MyChart) OR A paper copy in the mail If you have any lab test that is abnormal or we need to change your treatment, we will call you to review the results.  Testing/Procedures: NONE  Follow-Up: At [facility], you and your health needs are our priority.  As part of our continuing mission to provide you with exceptional heart care, we have created designated Provider Care Teams.  These Care Teams include your primary Cardiologist (physician) and Advanced Practice Providers (APPs -  Physician Assistants and Nurse Practitioners) who all work together to provide you with the care you need, when you need it.  Your next appointment:   1 year(s)  Provider:   Kristeen Miss, MD

## 2023-07-09 NOTE — Progress Notes (Signed)
Cardiology Office Note   Date:  07/09/2023   ID:  Brad Woods, DOB Aug 28, 1946, MRN [id number]  PCP:  Joycelyn Rua, MD  Cardiologist:   Kristeen Miss, MD   Problem list 1.  Coronary artery disease-status post stenting 11/17 2.  Chronic diastolic congestive heart failure  Chief Complaint  Patient presents with   Coronary Artery Disease        Hypertension          Brad Woods is a 77 y.o. male who presents for follow up of his CAD.   Seen with wife , [name]  Has been having CP since May. Went to the [facility] , was sent to the hospital May 18, 2016 Was admitted,    At 2 AM , his CP worsened acutely .   Had a stent to his LCx.   ( 90% - 0%)   He wanted to change cardiologist and presents for further management .   Has been having CP similar to his presenting symptom in May. Seem to be worse with exertion, better with rest.   Had another episode of CP last night. Mid sternal CP, radiation down his left arm .  Took a SL NTG  - CP was relieved with NTG .   Has had some shortness of breath - possibly shortly after taking the Brilinta   Also has some belching relieves the CP    does not get any regular exercise. Before the MI, was going several times a week   [name]. 8, 2018:  Brad Woods had his heart catheterization since her last visit. He was found have moderate disease. His stents are patent. He did not require any additional stenting. He was changed from Brilinta to Plavix because of shortness of breath. He seems to be feeling a lot better after that medicine change. Wants to get back into cardiac rehab   May 13, 2017:  Brad Woods is seen for follow up of his CAD. We changed the Brilinta to plavix which helped his dyspnea Rare episodes of CP Has indigestion frequently ,  Belches seem to relieve his CP on occasion .  Exercises regularly  Wt Readings from Last 3 Encounters:  07/09/23 273 lb 3.2 oz (123.9 kg)  06/18/22 299 lb (135.6 kg)  01/17/22 (!) 305 lb 9.6 oz  (138.6 kg)   Wt is down 7 lbs since January .   November 11, 2017:  Brad Woods is seen today for follow-up visit. Hx of CAD, hyperlipidemia,  Stopped atorvastatin 2-3 weeks ago due due to diffuse muscle aches and pains.  He is feeling much better since stopping the atorvastatin. BP looks good today  Had some atypical CP , related to indigestion / gas pains  Getting some exercise.  Lots of ortho issues.   January 08, 2019:  Brad Woods seen today for further evaluation of his hypertension. Wt today is 306 lbs  He measures his blood pressure at home.  He has been getting lots of high readings.  His blood pressure is normal here in the office today.  Does not exercise as much as he would like . Has knee issues  Sees Dr. Magnus Ivan  Needs to lose weight   No CP or angina   July 27, 2020:  Brad Woods is seen today for follow-up of his coronary artery disease.  He status post stenting in the past.  He also has a history of hyperlipidemia, obesity.  Has a history of hypertension.  His blood pressure has been fairly  well controlled. Weight today is 306 pounds.  Is having swimmy headedness.   Was having vertigo Is lightheaded.  Feels like he is on a boat.   Is worse going from seated to standing  Went to the ER with dizziness on June 30.   Head MRI in the ER was unremarkable Was not orthostatic   Heart catheterization on May 31, 2020 reveals moderate diffuse disease.  He has previously placed stent in the proximal circumflex artery was widely patent.  There were no critical lesions that needed any other intervention.  He ran out of Losartan  -    Aug. 5, 2022:  Brad Woods is seen today for follow up of his CAD , obesity, HTN, HLD  No CP ,   has DOe , chronic dyspnea. Wt = 293 Advised continued weight loss. Needs to exercise more  Lab from April 8 look good.  LDL is 6   June 26 , 2023   Brad Woods is seen today for follow up of his CAD, obesity, HTN, HLD  Wt =  299 lbs  Wants to lose weight  -   Needs to lose weight before he can have knee surgery  Gets out of breath waking to the mailbix Likely has become deconditioned Used to walk the neighborhood I recommended stationary bike   July 09, 2023: Brad Woods is seen today for follow-up of his coronary artery disease, obesity, hypertension, hyperlipidemia.  His weight is 273 which is down 26 pounds from last year.  Heart catheterization from June, 2021 reveals moderate diffuse disease in all 3 coronary arteries.  Medical therapy was recommended.  Continues to have DOE No CP , no chest pressure  Might have some left shoulder pain if he walks too far  Will increase Imdur to 60 mg a day   Is n Ozympic,  doing well   Past Medical History:  Diagnosis Date   Allergy    Arthritis    Asthma    when younger   GERD (gastroesophageal reflux disease)    Headache    Hyperlipidemia    Hypertension    Kidney stones    Myocardial infarction [facility])    Sleep apnea    cpap    Past Surgical History:  Procedure Laterality Date   CARDIAC CATHETERIZATION N/A 05/20/2016   Procedure: Left Heart Cath and Coronary Angiography;  Surgeon: Runell Gess, MD;  Location: [facility] INVASIVE CV LAB;  Service: Cardiovascular;  Laterality: N/A;   CARDIAC CATHETERIZATION N/A 11/22/2016   Procedure: Left Heart Cath and Coronary Angiography;  Surgeon: Yvonne Kendall, MD;  Location: [facility] INVASIVE CV LAB;  Service: Cardiovascular;  Laterality: N/A;   COLONOSCOPY     EYE SURGERY     LEFT HEART CATH AND CORONARY ANGIOGRAPHY N/A 05/31/2020   Procedure: LEFT HEART CATH AND CORONARY ANGIOGRAPHY;  Surgeon: Lennette Bihari, MD;  Location: [facility] INVASIVE CV LAB;  Service: Cardiovascular;  Laterality: N/A;   RECTAL SURGERY     REPLACEMENT TOTAL KNEE Bilateral      Current Outpatient Medications  Medication Sig Dispense Refill   ADVAIR HFA 115-21 MCG/ACT inhaler Inhale 2 puffs into the lungs 2 (two) times daily as needed.     albuterol (PROVENTIL HFA;VENTOLIN HFA) 108 (90  Base) MCG/ACT inhaler Inhale 2 puffs into the lungs every 6 (six) hours as needed for wheezing or shortness of breath. 1 Inhaler 2   aspirin EC 81 MG EC tablet Take 1 tablet (81 mg total) by mouth daily.  carvedilol (COREG) 12.5 MG tablet Take 1 tablet (12.5 mg total) by mouth 2 (two) times daily. 180 tablet 0   clopidogrel (PLAVIX) 75 MG tablet Take 1 tablet (75 mg total) by mouth daily. 90 tablet 0   finasteride (PROSCAR) 5 MG tablet Take 5 mg by mouth daily.     fluticasone (FLONASE) 50 MCG/ACT nasal spray Place 2 sprays into the nose daily as needed for allergies.      isosorbide mononitrate (IMDUR) 60 MG 24 hr tablet Take 1 tablet (60 mg total) by mouth daily. 90 tablet 3   Multiple Vitamins-Minerals (CENTRUM SILVER 50+MEN PO) Take 1 tablet by mouth every other day.      Semaglutide, 1 MG/DOSE, (OZEMPIC, 1 MG/DOSE,) 2 MG/1.5ML SOPN Inject 1.5 mg into the skin once a week.     tamsulosin (FLOMAX) 0.4 MG CAPS capsule Take 0.4 mg by mouth at bedtime.      nitroGLYCERIN (NITROSTAT) 0.4 MG SL tablet Dissolve 1 tablet under the tongue every 5 minutes as needed for chest pain. Max of 3 doses, then 911. 25 tablet 3   REPATHA SURECLICK 140 MG/ML SOAJ INJECT ONE pen into THE SKIN every 14 DAYS 6 mL 3   No current facility-administered medications for this visit.    Allergies:   Hydromorphone hcl, Hydromorphone, and Aleve [naproxen sodium]    Social History:  The patient  reports that he has never smoked. He has never used smokeless tobacco. He reports current alcohol use. He reports that he does not use drugs.   Family History:  The patient's family history includes Colon cancer (age of onset: 37) in his mother.    ROS:  Please see the history of present illness.   Noted in the current history.  All other systems are negative.Marland Kitchen    Physical Exam: Blood pressure 126/64, pulse 80, height 6\' 2"  (1.88 m), weight 273 lb 3.2 oz (123.9 kg), SpO2 98%.       GEN: moderately obese male,  NAD    HEENT: Normal NECK: No JVD; No carotid bruits LYMPHATICS: No lymphadenopathy CARDIAC: RRR , no murmurs, rubs, gallops RESPIRATORY:  Clear to auscultation without rales, wheezing or rhonchi  ABDOMEN: Soft, non-tender, non-distended MUSCULOSKELETAL:  No edema; No deformity  SKIN: Warm and dry NEUROLOGIC:  Alert and oriented x 3     EKG:  EKG Interpretation Date/Time:  Tuesday July 09 2023 09:29:23 EDT Ventricular Rate:  78 PR Interval:  184 QRS Duration:  146 QT Interval:  424 QTC Calculation: 483 R Axis:   -41  Text Interpretation: Normal sinus rhythm Left axis deviation Right bundle branch block Inferior infarct (cited on or before 22-Jun-2020) When compared with ECG of 22-Jun-2020 06:24, No significant change was found Confirmed by Kristeen Miss ([id number]) on 07/09/2023 10:00:00 AM EKG Interpretation Date/Time:  Tuesday July 09 2023 09:29:23 EDT Ventricular Rate:  78 PR Interval:  184 QRS Duration:  146 QT Interval:  424 QTC Calculation: 483 R Axis:   -41  Text Interpretation: Normal sinus rhythm Left axis deviation Right bundle branch block Inferior infarct (cited on or before 22-Jun-2020) When compared with ECG of 22-Jun-2020 06:24, No significant change was found Confirmed by Kristeen Miss ([id number]) on 07/09/2023 10:00:00 AM      Recent Labs: No results found for requested labs within last 365 days.    Lipid Panel    Component Value Date/Time   CHOL 114 01/07/2019 0806   TRIG 159 (H) 01/07/2019 0806   HDL 34 (L) 01/07/2019  0806   CHOLHDL 3.4 01/07/2019 0806   CHOLHDL 4.0 11/20/2016 1643   VLDL 31 (H) 11/20/2016 1643   LDLCALC 48 01/07/2019 0806      Wt Readings from Last 3 Encounters:  07/09/23 273 lb 3.2 oz (123.9 kg)  06/18/22 299 lb (135.6 kg)  01/17/22 (!) 305 lb 9.6 oz (138.6 kg)      Other studies Reviewed: Additional studies/ records that were reviewed today include: . Review of the above records demonstrates:    ASSESSMENT AND PLAN:  1.  CAD  :      increase imdur to 60 mg a day .  Advised continued weight loss Cont lipid management    2. Hyperlipidemia:     cont repatha .  Check lipids today   3. Morbid obesity :     cont zoympic,  is down 26 lbs from last year   4.  Hypertension:  BP is well controlled.    Current medicines are reviewed at length with the patient today.  The patient does not have concerns regarding medicines.  Labs/ tests ordered today include:   Orders Placed This Encounter  Procedures   Lipid panel   ALT   Basic metabolic panel   EKG 12-Lead      Disposition:   FU with  APP or  Me in  1 year     Kristeen Miss, MD  07/09/2023 6:25 PM    [facility] [address] [place], [place], [place]  [address] Phone: [phone]; Fax: [phone]

## 2023-07-10 LAB — BASIC METABOLIC PANEL
BUN/Creatinine Ratio: 18 (ref 10–24)
BUN: 17 mg/dL (ref 8–27)
CO2: 24 mmol/L (ref 20–29)
Calcium: 9.4 mg/dL (ref 8.6–10.2)
Chloride: 102 mmol/L (ref 96–106)
Creatinine, Ser: 0.95 mg/dL (ref 0.76–1.27)
Glucose: 90 mg/dL (ref 70–99)
Potassium: 4.1 mmol/L (ref 3.5–5.2)
Sodium: 142 mmol/L (ref 134–144)
eGFR: 83 mL/min/{1.73_m2} (ref >59)

## 2023-07-10 LAB — LIPID PANEL
Chol/HDL Ratio: 3.7 ratio (ref 0.0–5.0)
Cholesterol, Total: 119 mg/dL (ref 100–199)
HDL: 32 mg/dL — ABNORMAL LOW (ref >39)
LDL Chol Calc (NIH): 60 mg/dL (ref 0–99)
Triglycerides: 157 mg/dL — ABNORMAL HIGH (ref 0–149)
VLDL Cholesterol Cal: 27 mg/dL (ref 5–40)

## 2023-07-10 LAB — ALT: ALT: 23 IU/L (ref 0–44)

## 2023-07-17 DIAGNOSIS — Z6836 Body mass index (BMI) 36.0-36.9, adult: Secondary | ICD-10-CM | POA: Diagnosis not present

## 2023-07-17 DIAGNOSIS — K5900 Constipation, unspecified: Secondary | ICD-10-CM | POA: Diagnosis not present

## 2023-07-17 DIAGNOSIS — I2510 Atherosclerotic heart disease of native coronary artery without angina pectoris: Secondary | ICD-10-CM | POA: Diagnosis not present

## 2023-07-17 DIAGNOSIS — M1990 Unspecified osteoarthritis, unspecified site: Secondary | ICD-10-CM | POA: Diagnosis not present

## 2023-08-01 ENCOUNTER — Other Ambulatory Visit: Payer: Self-pay

## 2023-08-01 ENCOUNTER — Encounter: Payer: Self-pay | Admitting: Physician Assistant

## 2023-08-01 ENCOUNTER — Ambulatory Visit: Payer: PPO | Admitting: Physician Assistant

## 2023-08-01 DIAGNOSIS — G8929 Other chronic pain: Secondary | ICD-10-CM | POA: Diagnosis not present

## 2023-08-01 DIAGNOSIS — M25511 Pain in right shoulder: Secondary | ICD-10-CM | POA: Diagnosis not present

## 2023-08-01 DIAGNOSIS — M25562 Pain in left knee: Secondary | ICD-10-CM | POA: Diagnosis not present

## 2023-08-01 MED ORDER — LIDOCAINE HCL 1 % IJ SOLN
5.0000 mL | INTRAMUSCULAR | Status: AC | PRN
  Administered 2023-08-01: 5 mL

## 2023-08-01 MED ORDER — METHYLPREDNISOLONE ACETATE 40 MG/ML IJ SUSP
40.0000 mg | INTRAMUSCULAR | Status: AC | PRN
  Administered 2023-08-01: 40 mg via INTRA_ARTICULAR

## 2023-08-01 NOTE — Progress Notes (Signed)
Office Visit Note   Patient: Brad Woods           Date of Birth: July 29, 1946           MRN: [id number] Visit Date: 08/01/2023              Requested by: Joycelyn Rua, MD [address] [place],  [place] [address] PCP: Joycelyn Rua, MD   Assessment & Plan: Visit Diagnoses:  1. Chronic pain of left knee   2. Chronic right shoulder pain     Plan: Will refer him to Dr. August Saucer for further workup evaluation for possible right shoulder arthroplasty.  He will need to get clearance from his cardiologist who he is recently seen.  He will have to come off of Plavix.  He will begin working on this.  In regards to his knees he would benefit from bilateral knee open arthrotomy knees with lysis of adhesions and upsizing poly.  He can follow-up with [place] in the future as needed.  He tolerated the aspiration and cortisone injection in the knee well today.  Follow-Up Instructions: Return for Dr. August Saucer.   Orders:  Orders Placed This Encounter  Procedures   XR Knee 1-2 Views Left   XR Shoulder Right   No orders of the defined types were placed in this encounter.     Procedures: Large Joint Inj: L knee on 08/01/2023 4:27 PM Indications: pain Details: 22 G 1.5 in needle, anterolateral approach  Arthrogram: No  Medications: 5 mL lidocaine 1 %; 40 mg methylPREDNISolone acetate 40 MG/ML Aspirate: 40 mL yellow and blood-tinged Outcome: tolerated well, no immediate complications Procedure, treatment alternatives, risks and benefits explained, specific risks discussed. Consent was given by the patient. Immediately prior to procedure a time out was called to verify the correct patient, procedure, equipment, support staff and site/side marked as required. Patient was prepped and draped in the usual sterile fashion.       Clinical Data: No additional findings.   Subjective: Chief Complaint  Patient presents with   Right Shoulder - Pain   Left Knee - Pain    HPI Patient  77 year old male well-known to Dr. Juluis Woods comes in today for for 2 complaints 1 right shoulder pain and to left knee pain.  He has had right shoulder pain for years.  Has had injections in the past with Dr. Jorge Ny with no real relief.  He is having severe pain in the shoulder is keeping him awake at night.  He lives with constant pain and decreased range of motion of the right shoulder. Left knee pain for some time.  Had history of bilateral knee replacements done in 2014 in [facility].  He states he has had pain with both knees since undergoing knee replacements.  He is unable to take NSAIDs because he is on chronic Plavix.  He does have some pain in the right knee but currently left knee is more bothersome.  Previous bone scan did not not show any gross findings of for loosening or prosthesis failure.  Dr. Magnus Ivan in the past and recommend open arthrotomy with lysis of adhesions and upsizing the polyliner of his right knee.  Review of Systems See HPI otherwise negative noncontributory  Objective: Vital Signs: There were no vitals taken for this visit.  Physical Exam Pulmonary:     Effort: Pulmonary effort is normal.  Neurological:     Mental Status: He is alert and oriented to person, place, and time.  Psychiatric:        Mood and Affect: Mood normal.     Ortho Exam Bilateral shoulders: 5 out of 5 strength external and internal rotation with resistance.  Liftoff test on the right is negative.  He has limited active and passive forward flexion with active range of motion he is able to ratchet the arm up to approximately 90 degrees.  Passively we can come up to about 110 degrees. Bilateral knees: No abnormal warmth or erythema.  Left knee positive effusion.  Incisions are well-healed.  No instability with valgus varus stressing of either knee.  There is laxity with anterior drawer both knees.  Left knee considerable crepitus with passive range of motion. Specialty Comments:   No specialty comments available.  Imaging: XR Knee 1-2 Views Left  Result Date: 08/01/2023 Left knee 2 views: Knee is well located.  Status post left total knee arthroplasty with well-seated components.  Positive effusion.  Lateral gutter ossification versus foreign body seen on prior films.  XR Shoulder Right  Result Date: 08/01/2023 Right shoulder 3 views: Shoulder is well located.  Loss of subacromial space.  Severe end-stage osteoarthritis.  No acute fractures.    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   Morbid obesity (HCC) 07/27/2020   Dizziness 06/22/2020   Exertional dyspnea    Chronic pain of left knee 09/02/2018   Chronic pain of right knee 09/02/2018   History of bilateral knee replacement 09/02/2018   Allergic rhinitis 08/15/2018   Erectile dysfunction 08/15/2018   History of renal stone 08/15/2018   Migraine 08/15/2018   Osteoarthritis 08/15/2018   Hyperlipidemia 08/15/2018   PVD (peripheral vascular disease) (HCC) 08/15/2018   Left shoulder pain 07/30/2018   Chest pain 07/30/2018   Trochanteric bursitis, left hip 05/02/2017   Carpal tunnel syndrome, left upper limb 05/02/2017   Trochanteric bursitis, right hip 03/18/2017   Mixed hyperlipidemia 12/31/2016   Coronary artery disease involving native coronary artery of native heart with angina pectoris (HCC) 11/25/2016   Coronary artery disease involving native coronary artery of native heart without angina pectoris 11/20/2016   Elevated troponin    Unstable angina (HCC) 05/18/2016   Acute coronary syndrome (HCC) 05/18/2016   Benign non-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 10/12/2014   Essential hypertension 10/12/2014   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 10/12/2014   High risk medication use 10/12/2014   IFG (impaired fasting glucose) 10/12/2014   OSA (obstructive sleep apnea) 02/04/2014   Hypothyroidism 03/02/2013   GERD  (gastroesophageal reflux disease) 09/04/2012   Past Medical History:  Diagnosis Date   Allergy    Arthritis    Asthma    when younger   GERD (gastroesophageal reflux disease)    Headache    Hyperlipidemia    Hypertension    Kidney stones    Myocardial infarction [facility])    Sleep apnea    cpap    Family History  Problem Relation Age of Onset   Colon cancer Mother 80    Past Surgical History:  Procedure Laterality Date   CARDIAC CATHETERIZATION N/A 05/20/2016   Procedure: Left Heart Cath and Coronary Angiography;  Surgeon: Runell Gess, MD;  Location: [facility] INVASIVE CV LAB;  Service: Cardiovascular;  Laterality: N/A;   CARDIAC CATHETERIZATION N/A 11/22/2016   Procedure: Left Heart Cath and Coronary Angiography;  Surgeon: Yvonne Kendall, MD;  Location: [facility] INVASIVE CV LAB;  Service: Cardiovascular;  Laterality: N/A;   COLONOSCOPY  EYE SURGERY     LEFT HEART CATH AND CORONARY ANGIOGRAPHY N/A 05/31/2020   Procedure: LEFT HEART CATH AND CORONARY ANGIOGRAPHY;  Surgeon: Lennette Bihari, MD;  Location: [facility] INVASIVE CV LAB;  Service: Cardiovascular;  Laterality: N/A;   RECTAL SURGERY     REPLACEMENT TOTAL KNEE Bilateral    Social History   Occupational History   Not on file  Tobacco Use   Smoking status: Never   Smokeless tobacco: Never  Vaping Use   Vaping status: Never Used  Substance and Sexual Activity   Alcohol use: Yes    Alcohol/week: 0.0 standard drinks of alcohol    Comment: occasionally   Drug use: No   Sexual activity: Yes    Birth control/protection: None

## 2023-08-01 NOTE — Progress Notes (Signed)
Brad Woods saw patient today for right shoulder arthritis. He would like for him to see you if you are agreeable. I have ordered thin cut CT scan so that we can have this prior to his f/u appt with you. Please advise if you think this is ok? We will need to call patient and let them know we are referring for CT scan if you agree with plan?

## 2023-08-02 NOTE — Progress Notes (Signed)
I called patient and advised, CT has been ordered and will need to be completed prior to appt with Dr. August Saucer.

## 2023-08-02 NOTE — Progress Notes (Signed)
Agree with thin cut ct for rsa thx

## 2023-08-09 ENCOUNTER — Telehealth: Payer: Self-pay | Admitting: Cardiovascular Disease

## 2023-08-09 DIAGNOSIS — R0609 Other forms of dyspnea: Secondary | ICD-10-CM

## 2023-08-09 NOTE — Telephone Encounter (Signed)
Dr Elease Hashimoto okay to place pulmonology referral for his DOE. Called to let pt know referral was placed.

## 2023-08-09 NOTE — Telephone Encounter (Signed)
Patient states Dr. Elease Hashimoto discussed following up with a pulmonologist and he would like to be referred.

## 2023-08-14 ENCOUNTER — Ambulatory Visit: Payer: PPO | Admitting: Orthopedic Surgery

## 2023-08-20 ENCOUNTER — Ambulatory Visit
Admission: RE | Admit: 2023-08-20 | Discharge: 2023-08-20 | Disposition: A | Discharge status: Home / Self Care | Payer: PPO | Source: Ambulatory Visit | Attending: Physician Assistant | Admitting: Physician Assistant

## 2023-08-20 DIAGNOSIS — I2510 Atherosclerotic heart disease of native coronary artery without angina pectoris: Secondary | ICD-10-CM | POA: Diagnosis not present

## 2023-08-20 DIAGNOSIS — M19011 Primary osteoarthritis, right shoulder: Secondary | ICD-10-CM | POA: Diagnosis not present

## 2023-08-20 DIAGNOSIS — K5900 Constipation, unspecified: Secondary | ICD-10-CM | POA: Diagnosis not present

## 2023-08-20 DIAGNOSIS — Z6836 Body mass index (BMI) 36.0-36.9, adult: Secondary | ICD-10-CM | POA: Diagnosis not present

## 2023-08-20 DIAGNOSIS — M1990 Unspecified osteoarthritis, unspecified site: Secondary | ICD-10-CM | POA: Diagnosis not present

## 2023-08-20 DIAGNOSIS — G8929 Other chronic pain: Secondary | ICD-10-CM

## 2023-08-29 ENCOUNTER — Encounter: Payer: Self-pay | Admitting: Orthopedic Surgery

## 2023-08-29 ENCOUNTER — Ambulatory Visit: Payer: PPO | Admitting: Orthopedic Surgery

## 2023-08-29 DIAGNOSIS — M19011 Primary osteoarthritis, right shoulder: Secondary | ICD-10-CM

## 2023-08-29 NOTE — Progress Notes (Signed)
Office Visit Note   Patient: Brad Woods           Date of Birth: 03-16-1946           MRN: [id number] Visit Date: 08/29/2023 Requested by: Joycelyn Rua, MD [address] [place],  [place] [address] PCP: Joycelyn Rua, MD  Subjective: Chief Complaint  Patient presents with   Right Shoulder - Follow-up, Pain    HPI: Brad Woods is a 77 y.o. male who presents to the office reporting right shoulder pain.  Since he was last seen has had a thin cut CT scan of his right shoulder which does show severe arthritis with B2 glenoid and some posterior erosion.  Patient denies any history of injury and there is no prior surgery to the right shoulder.  Does have a history of injection which gave him some short-lived relief.  The pain does wake him from sleep on some nights.  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  This is part-time.  He is really getting tired of dealing with the pain.  Does take medication for the problem but with limited relief.  Denies much in the way of radicular symptoms..                ROS: All systems reviewed are negative as they relate to the chief complaint within the history of present illness.  Patient denies fevers or chills.  Assessment & Plan: Visit Diagnoses:  1. Arthritis of right shoulder region     Plan: Impression is right shoulder arthritis with pain as well as some loss of strength and external rotation as well as diminished range of motion.  Patient has pain with most of his ADLs.  The risk and benefits of reverse shoulder replacement are discussed with the patient including not limited to infection nerve vessel damage incomplete pain relief incomplete restoration of function as well as instability.  With the use of diagrams and models the rationale of reverse shoulder replacement is discussed.  The benefits of patient specific instrumentation are also demonstrated.  Patient does have a history of  cardiac issues and takes Plavix.  He would need cardiac risk stratification prior to surgery.  Patient understands risk and benefits and is going to discuss this with his wife.  Debbie's card is provided.  He should call if he wants to get this scheduled.  All questions answered.  Follow-Up Instructions: No follow-ups on file.   Orders:  No orders of the defined types were placed in this encounter.  No orders of the defined types were placed in this encounter.     Procedures: No procedures performed   Clinical Data: No additional findings.  Objective: Vital Signs: There were no vitals taken for this visit.  Physical Exam:  Constitutional: Patient appears well-developed HEENT:  Head: Normocephalic Eyes:EOM are normal Neck: Normal range of motion Cardiovascular: Normal rate Pulmonary/chest: Effort normal Neurologic: Patient is alert Skin: Skin is warm Psychiatric: Patient has normal mood and affect  Ortho Exam: Ortho exam demonstrates range of motion on the right to 40/60/90.  Range of motion on the left 60/90/140.  He has 4+ out of 5 weakness to external rotation on the right compared to the left.  Subscap strength 5+ out of 5 bilaterally.  Some bicipital groove tenderness is present.  Deltoid is functional.  Crepitus is present with passive range of motion of that shoulder  Specialty Comments:  No specialty comments available.  Imaging: No results found.   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   Morbid obesity (HCC) 07/27/2020   Dizziness 06/22/2020   Exertional dyspnea    Chronic pain of left knee 09/02/2018   Chronic pain of right knee 09/02/2018   History of bilateral knee replacement 09/02/2018   Allergic rhinitis 08/15/2018   Erectile dysfunction 08/15/2018   History of renal stone 08/15/2018   Migraine 08/15/2018   Osteoarthritis 08/15/2018   Hyperlipidemia 08/15/2018   PVD (peripheral  vascular disease) (HCC) 08/15/2018   Left shoulder pain 07/30/2018   Chest pain 07/30/2018   Trochanteric bursitis, left hip 05/02/2017   Carpal tunnel syndrome, left upper limb 05/02/2017   Trochanteric bursitis, right hip 03/18/2017   Mixed hyperlipidemia 12/31/2016   Coronary artery disease involving native coronary artery of native heart with angina pectoris (HCC) 11/25/2016   Coronary artery disease involving native coronary artery of native heart without angina pectoris 11/20/2016   Elevated troponin    Unstable angina (HCC) 05/18/2016   Acute coronary syndrome (HCC) 05/18/2016   Benign non-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 10/12/2014   Essential hypertension 10/12/2014   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 10/12/2014   High risk medication use 10/12/2014   IFG (impaired fasting glucose) 10/12/2014   OSA (obstructive sleep apnea) 02/04/2014   Hypothyroidism 03/02/2013   GERD (gastroesophageal reflux disease) 09/04/2012   Past Medical History:  Diagnosis Date   Allergy    Arthritis    Asthma    when younger   GERD (gastroesophageal reflux disease)    Headache    Hyperlipidemia    Hypertension    Kidney stones    Myocardial infarction [facility])    Sleep apnea    cpap    Family History  Problem Relation Age of Onset   Colon cancer Mother 54    Past Surgical History:  Procedure Laterality Date   CARDIAC CATHETERIZATION N/A 05/20/2016   Procedure: Left Heart Cath and Coronary Angiography;  Surgeon: Runell Gess, MD;  Location: [facility] INVASIVE CV LAB;  Service: Cardiovascular;  Laterality: N/A;   CARDIAC CATHETERIZATION N/A 11/22/2016   Procedure: Left Heart Cath and Coronary Angiography;  Surgeon: Yvonne Kendall, MD;  Location: [facility] INVASIVE CV LAB;  Service: Cardiovascular;  Laterality: N/A;   COLONOSCOPY     EYE SURGERY     LEFT HEART CATH AND CORONARY ANGIOGRAPHY N/A 05/31/2020   Procedure: LEFT HEART CATH AND CORONARY ANGIOGRAPHY;  Surgeon: Lennette Bihari, MD;  Location: [facility] INVASIVE CV LAB;  Service: Cardiovascular;  Laterality: N/A;   RECTAL SURGERY     REPLACEMENT TOTAL KNEE Bilateral    Social History   Occupational History   Not on file  Tobacco Use   Smoking status: Never   Smokeless tobacco: Never  Vaping Use   Vaping status: Never Used  Substance and Sexual Activity   Alcohol use: Yes    Alcohol/week: 0.0 standard drinks of alcohol    Comment: occasionally   Drug use: No   Sexual activity: Yes    Birth control/protection: None

## 2023-09-05 DIAGNOSIS — I5032 Chronic diastolic (congestive) heart failure: Secondary | ICD-10-CM | POA: Diagnosis not present

## 2023-09-05 DIAGNOSIS — I10 Essential (primary) hypertension: Secondary | ICD-10-CM | POA: Diagnosis not present

## 2023-09-05 DIAGNOSIS — I2510 Atherosclerotic heart disease of native coronary artery without angina pectoris: Secondary | ICD-10-CM | POA: Diagnosis not present

## 2023-09-05 DIAGNOSIS — Z0000 Encounter for general adult medical examination without abnormal findings: Secondary | ICD-10-CM | POA: Diagnosis not present

## 2023-09-05 DIAGNOSIS — N400 Enlarged prostate without lower urinary tract symptoms: Secondary | ICD-10-CM | POA: Diagnosis not present

## 2023-09-05 DIAGNOSIS — I110 Hypertensive heart disease with heart failure: Secondary | ICD-10-CM | POA: Diagnosis not present

## 2023-09-05 DIAGNOSIS — E7800 Pure hypercholesterolemia, unspecified: Secondary | ICD-10-CM | POA: Diagnosis not present

## 2023-09-10 ENCOUNTER — Telehealth: Payer: Self-pay | Admitting: Orthopedic Surgery

## 2023-09-10 NOTE — Telephone Encounter (Signed)
Patient would like to move forward on right shoulder replacement.  A cardiac clearance request has been sent to Dr. Elease Hashimoto along with request for perioperative instruction on Plavix.  Please provide surgery order.  Patient has asked for [place] to hold 10-22-23 for a surgery date.

## 2023-09-11 ENCOUNTER — Telehealth: Payer: Self-pay

## 2023-09-11 NOTE — Telephone Encounter (Signed)
Done thx

## 2023-09-11 NOTE — Telephone Encounter (Signed)
Pre-operative Risk Assessment    Patient Name: Brad Woods  DOB: 11/13/1946 MRN: [id number]=     Request for Surgical Clearance    Procedure:  Right shoulder replacement   Date of Surgery:  Clearance TBD                                 Surgeon:  Dr. Dorene Grebe Surgeon's Group or Practice Name:  [facility]  Phone number:  [phone] Fax number:  [phone]   Type of Clearance Requested:   - Pharmacy:  Hold Clopidogrel (Plavix)     Type of Anesthesia:  General    Additional requests/questions:    [name]   09/11/2023, 4:50 PM

## 2023-09-12 NOTE — Telephone Encounter (Signed)
Name: Brad Woods  DOB: January 10, 1946  MRN: [id number]  Primary Cardiologist: Kristeen Miss, MD  Chart reviewed as part of pre-operative protocol coverage. Because of Holly A Sedlack's past medical history and time since last visit, he will require a follow-up in-office visit in order to better assess preoperative cardiovascular risk.  Pre-op covering staff: - Please schedule appointment and call patient to inform them. If patient already had an upcoming appointment within acceptable timeframe, please add "pre-op clearance" to the appointment notes so provider is aware. - Please contact requesting surgeon's office via preferred method (i.e, phone, fax) to inform them of need for appointment prior to surgery.  Patient may hold plavix for 5 days prior to procedure, please resume when medically safe to do so. Ideally aspirin would be continued through the perioperative period, if bleeding risk is felt to be too high aspirin can be held for 5-7 days prior to procedure, please resume when safe to do so from a bleeding perspective.   Rip Harbour, NP  09/12/2023, 10:10 AM

## 2023-09-13 NOTE — Telephone Encounter (Signed)
Spoke with patient who is agreeable to do an in-office visit with Tereso Newcomer, PA-C on 10/8 at 8:50 am. I will fax updates to requesting provider's office.

## 2023-09-24 DIAGNOSIS — Z6836 Body mass index (BMI) 36.0-36.9, adult: Secondary | ICD-10-CM | POA: Diagnosis not present

## 2023-09-24 DIAGNOSIS — I2510 Atherosclerotic heart disease of native coronary artery without angina pectoris: Secondary | ICD-10-CM | POA: Diagnosis not present

## 2023-09-24 DIAGNOSIS — K5900 Constipation, unspecified: Secondary | ICD-10-CM | POA: Diagnosis not present

## 2023-09-24 DIAGNOSIS — M1990 Unspecified osteoarthritis, unspecified site: Secondary | ICD-10-CM | POA: Diagnosis not present

## 2023-10-01 ENCOUNTER — Encounter: Payer: Self-pay | Admitting: Physician Assistant

## 2023-10-01 ENCOUNTER — Ambulatory Visit: Payer: PPO | Attending: Physician Assistant | Admitting: Emergency Medicine

## 2023-10-01 VITALS — BP 121/62 | HR 80 | Ht 74.0 in | Wt 273.6 lb

## 2023-10-01 DIAGNOSIS — I2510 Atherosclerotic heart disease of native coronary artery without angina pectoris: Secondary | ICD-10-CM

## 2023-10-01 DIAGNOSIS — Z01810 Encounter for preprocedural cardiovascular examination: Secondary | ICD-10-CM | POA: Diagnosis not present

## 2023-10-01 DIAGNOSIS — I10 Essential (primary) hypertension: Secondary | ICD-10-CM | POA: Diagnosis not present

## 2023-10-01 DIAGNOSIS — E782 Mixed hyperlipidemia: Secondary | ICD-10-CM | POA: Diagnosis not present

## 2023-10-01 MED ORDER — CLOPIDOGREL BISULFATE 75 MG PO TABS: 75.0000 mg | ORAL_TABLET | Freq: Every day | ORAL | 3 refills | Status: DC

## 2023-10-01 MED ORDER — ISOSORBIDE MONONITRATE ER 60 MG PO TB24: 60.0000 mg | ORAL_TABLET | Freq: Every day | ORAL | 3 refills | Status: DC

## 2023-10-01 MED ORDER — CARVEDILOL 12.5 MG PO TABS: 12.5000 mg | ORAL_TABLET | Freq: Two times a day (BID) | ORAL | 3 refills | Status: AC

## 2023-10-01 NOTE — Progress Notes (Signed)
Cardiology Office Note:    Date:  10/01/2023  ID:  Brad Woods, DOB November 17, 1946, MRN [id number] PCP: Joycelyn Rua, MD  [facility] Providers Cardiologist:  Kristeen Miss, MD       Patient Profile:      Coronary Artery Disease LHC 05/31/2020: Multivessel CAD, LAD 40% ostial stenosis, 50% stenosis mid LAD, 75% apical stenosis. RCA significant aneurysmal dilation proximally. 75% ostial stenosis of PDA with 50% mid stenosis, Recommend medical therapy  LHC 11/22/2016: Diffuse coronary artery ectasia, diffusely diseased PDA, with 70% ostial stenosis and diffuse midvessel disease up to 50% Aggressive medical therapy LHC 05/20/2016: Mid Cx lesion, 90% stenosed. AV groove circumflex PCI and DES  DAPT started ECHO: 08/11/2020, LVEF 55-60% with normal function with borderline LVH. Trivial aortic and pulmonic valve regurgitation  Hyperlipidemia Hypertension Peripheral Vascular Disease OSA Hypothyroidism       History of Present Illness:   Brad Woods is a 77 y.o. male who returns for surgical clearance. He needs a right reverse shoulder arthroplasty.    He was last seen by Dr. Elease Hashimoto on 07/09/2023.  During that time he had lost a total of 26 pounds over the past year, he is on Ozempic.  His last heart catheterization was from June 2021 which showed diffuse disease in all 3 of his coronary arteries and medical therapy was recommended at the time.  He did have a successful AV groove circumflex PCI and DES for non-STEMI on 04/2016. He did note that he continues to have chronic dyspnea on exertion which has been noted to be related to overall deconditioning.   Today he is doing well overall.  He has no current acute complaints.  We reviewed his recommendations for holding Plavix 5 days prior to surgery.  He expressed understanding.  He notes that he is ready to have his shoulder replaced.  We reviewed his EKG, blood pressure, and weight.  His weight has been stable as he has not lost any  or gained any weight since his last visit in July.  His goal is to get to 250 pounds, he has having no side effects with his Ozempic.  He does note that he has chronic shortness of breath, and has been short of breath for many years.  He does note that during moderate activities can make him short of breath such as walking to the mailbox.  He has an appointment to see pulmonology this Thursday as he is eager to get some answers.  He should increase his physical activity as tolerated and continue to lose weight.   He denies chest pain, lower extremity edema, fatigue, palpitations, hemoptysis, diaphoresis, weakness, presyncope, syncope, orthopnea, and PND.    Review of Systems  Constitutional: Negative for fever, weight gain and weight loss.  Cardiovascular:  Positive for dyspnea on exertion. Negative for chest pain, claudication, irregular heartbeat, leg swelling, near-syncope, orthopnea, palpitations, paroxysmal nocturnal dyspnea and syncope.  Respiratory:  Positive for shortness of breath. Negative for cough.   Musculoskeletal:  Negative for falls.  Neurological:  Negative for light-headedness.     See HPI     Studies Reviewed:   EKG Interpretation Date/Time:  Tuesday October 01 2023 08:46:19 EDT Ventricular Rate:  80 PR Interval:  206 QRS Duration:  138 QT Interval:  416 QTC Calculation: 479 R Axis:   -33  Text Interpretation: Normal sinus rhythm Left axis deviation Right bundle branch block Inferior Q waves noted No significant change was found Confirmed by Rise Paganini [phone])  on 10/01/2023 9:20:09 AM    Risk Assessment/Calculations:             Physical Exam:   VS:  BP 121/62   Pulse 80   Ht 6\' 2"  (1.88 m)   Wt 273 lb 9.6 oz (124.1 kg)   SpO2 96%   BMI 35.13 kg/m    Wt Readings from Last 3 Encounters:  10/01/23 273 lb 9.6 oz (124.1 kg)  07/09/23 273 lb 3.2 oz (123.9 kg)  06/18/22 299 lb (135.6 kg)    Constitutional:      Appearance: Healthy appearance.  Neck:      Vascular: JVD normal.  Pulmonary:     Effort: Pulmonary effort is normal.     Breath sounds: Normal breath sounds.  Cardiovascular:     PMI at left midclavicular line. Normal rate. Regular rhythm. Normal S1. Normal S2.      Murmurs: There is no murmur.     No gallop.  No click. No rub.  Pulses:    Intact distal pulses.  Edema:    Peripheral edema absent.        Assessment and Plan:  Preoperative cardiovascular examination [name] perioperative risk of a major cardiovascular event is  0.9% according to the revised cardiac risk index (RCRI).  Therefore he is at low risk for complications.  His functional capacity is good at 7.25 METs according to the Duke Activity Status Index (DASI). Recommendations: According to ACC/AHA guidelines, no further cardiovascular testing needed.  The patient may proceed to surgery at acceptable risk.   Antiplatelet and/or Anticoagulation Recommendations: Clopidogrel (Plavix) can be held for 5 days prior to his surgery and resumed as soon as possible post op. Ideally, Aspirin 81mg  should be continued without interruption unless bleeding risk is too high   2. Coronary Artery Disease -s/p stenting 10/2016.  -Denies chest pain, palpitations, syncope. -No need for ischemic evaluation -Heart healthy diet, increase physical activity as tolerated  -Continue Plavix 75 mg, to hold 5 days prior to surgery -Continue carvedilol 12.5 mg, Imdur 60 mg, nitroglycerin  3. Hypertension -Blood pressure today 121/62 -Currently controlled, continue carvedilol 12.5 mg, Imdur 60 mg -Encouraged to take blood pressures at home -Will refill his HTN meds today  4.  Hyperlipidemia -LDL on 07/09/2023 is 60 -Controlled, continue Repatha 140 mg/mL  5.  Obesity -Weight is stable since last visit, continue to try to lose weight -Continue Ozempic               Dispo:  Return in about 1 year (around 09/30/2024).  Signed, Denyce Robert, AGNP-C

## 2023-10-01 NOTE — Patient Instructions (Signed)
Medication Instructions:  Your physician recommends that you continue on your current medications as directed. Please refer to the Current Medication list given to you today.  *If you need a refill on your cardiac medications before your next appointment, please call your pharmacy*   Lab Work: None ordered  If you have labs (blood work) drawn today and your tests are completely normal, you will receive your results only by: MyChart Message (if you have MyChart) OR A paper copy in the mail If you have any lab test that is abnormal or we need to change your treatment, we will call you to review the results.   Testing/Procedures: None ordered   Follow-Up: At [facility], you and your health needs are our priority.  As part of our continuing mission to provide you with exceptional heart care, we have created designated Provider Care Teams.  These Care Teams include your primary Cardiologist (physician) and Advanced Practice Providers (APPs -  Physician Assistants and Nurse Practitioners) who all work together to provide you with the care you need, when you need it.  We recommend signing up for the patient portal called "MyChart".  Sign up information is provided on this After Visit Summary.  MyChart is used to connect with patients for Virtual Visits (Telemedicine).  Patients are able to view lab/test results, encounter notes, upcoming appointments, etc.  Non-urgent messages can be sent to your provider as well.   To learn more about what you can do with MyChart, go to [email or web address].    Your next appointment:   12 month(s)  Provider:   Kristeen Miss, MD     Other Instructions You can hold Plavix 5 days prior to surgery.  Your surgeon's office should give you directions pertaining to this.

## 2023-10-03 ENCOUNTER — Ambulatory Visit: Payer: PPO | Admitting: Pulmonary Disease

## 2023-10-03 ENCOUNTER — Ambulatory Visit: Payer: PPO

## 2023-10-03 ENCOUNTER — Encounter: Payer: Self-pay | Admitting: Pulmonary Disease

## 2023-10-03 VITALS — BP 140/72 | HR 73 | Temp 97.9°F | Ht 74.0 in | Wt 276.6 lb

## 2023-10-03 DIAGNOSIS — Z77090 Contact with and (suspected) exposure to asbestos: Secondary | ICD-10-CM | POA: Diagnosis not present

## 2023-10-03 DIAGNOSIS — R0609 Other forms of dyspnea: Secondary | ICD-10-CM

## 2023-10-03 DIAGNOSIS — G4733 Obstructive sleep apnea (adult) (pediatric): Secondary | ICD-10-CM | POA: Diagnosis not present

## 2023-10-03 DIAGNOSIS — S2232XA Fracture of one rib, left side, initial encounter for closed fracture: Secondary | ICD-10-CM | POA: Diagnosis not present

## 2023-10-03 MED ORDER — ALBUTEROL SULFATE HFA 108 (90 BASE) MCG/ACT IN AERS: 2.0000 | INHALATION_SPRAY | Freq: Four times a day (QID) | RESPIRATORY_TRACT | 6 refills | Status: AC | PRN

## 2023-10-03 MED ORDER — TRELEGY ELLIPTA 200-62.5-25 MCG/ACT IN AEPB: 1.0000 | INHALATION_SPRAY | Freq: Every day | RESPIRATORY_TRACT | 11 refills | Status: DC

## 2023-10-03 NOTE — Patient Instructions (Signed)
Nice to meet you  I recommend using Trelegy 1 puff once a day.  This is to treat asthma.  Rinse her mouth out with water after every use.  I refilled albuterol 2 puffs every 6 hours as needed for shortness of breath or wheeze.  I recommend using it 10 to 15 minutes before you plan to exercise or walk.  See if this gives you more stamina or more breath while exercising.  I ordered pulmonary function test for further evaluation of your breathing, see if this can be scheduled at the front desk  Ordered chest x-ray to be done today  Return to clinic in 3 months or sooner as needed with Dr. Judeth Horn

## 2023-10-03 NOTE — Progress Notes (Signed)
@Patient  ID: Brad Woods, male    DOB: 12-Aug-1946, 77 y.o.   MRN: [id number]  Chief Complaint  Patient presents with   Consult    SOB with exertion sx since high school. Sx worst x 4-5 years.    Referring provider: Nahser, Deloris Ping, MD  HPI:   77 y.o. man whom are seen for evaluation of dyspnea exertion.  Multiple cardiology notes reviewed.  Pulmonary note 2015 via [facility] system reviewed.  He reports issues with dyspnea exertion due to back to childhood.  Noticing a double ammonia at age 18.  In high school he was unable to read try due to dyspnea.  States he could run quickly but only for short distances but then would become became dyspneic chest tightness etc.  Has had dyspnea issues really all throughout his life.  More noticeable over the last 10 years or so he thinks.  No times a day when things are better or worse.  No position to make sense better or worse.  No seasonal environmental factors he can evaluate things better or worse.  He thinks albuterol when used as needed helps a little bit but for short period of time.  He has been on various ICS/LABA inhalers in the past but does not think it really helped much.  He reports need to get asbestosis exposure with earlier career.  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  He has history of coronary disease.  Most recent echocardiogram 07/2020 with AI, left heart cath 05/2020 with elevated LVEDP at 18 and several noted lesions in patent proximal circumflex to mid circumflex stent noted.  His most recent chest x-ray that I can see results are from 2016 and in the [facility] system, clear lungs without significant pleural disease.  Questionaires / Pulmonary Flowsheets:   ACT:      No data to display          MMRC:     No data to display          Epworth:      No data to display          Tests:   FENO:  No results found for: "NITRICOXIDE"  PFT:     No  data to display          WALK:      No data to display          Imaging: Personally reviewed and as per EMR and discussion in this note No results found.  Lab Results: Personally reviewed CBC    Component Value Date/Time   WBC 6.0 04/02/2021 1743   RBC 5.35 04/02/2021 1743   HGB 16.2 04/02/2021 1743   HGB 15.5 05/26/2020 1046   HCT 48.1 04/02/2021 1743   HCT 45.5 05/26/2020 1046   PLT 158 04/02/2021 1743   PLT 164 05/26/2020 1046   MCV 89.9 04/02/2021 1743   MCV 88 05/26/2020 1046   MCH 30.3 04/02/2021 1743   MCHC 33.7 04/02/2021 1743   RDW 13.3 04/02/2021 1743   RDW 13.7 05/26/2020 1046    BMET    Component Value Date/Time   NA 142 07/09/2023 1006   K 4.1 07/09/2023 1006   CL 102 07/09/2023 1006   CO2 24 07/09/2023 1006   GLUCOSE 90 07/09/2023 1006   GLUCOSE 99 04/02/2021 1743   BUN 17 07/09/2023 1006   CREATININE 0.95 07/09/2023 1006   CREATININE 0.96 11/20/2016 1643  CALCIUM 9.4 07/09/2023 1006   GFRNONAA >60 04/02/2021 1743   GFRAA 85 07/27/2020 0935    BNP No results found for: "BNP"  ProBNP No results found for: "PROBNP"  Specialty Problems       Pulmonary Problems   OSA (obstructive sleep apnea)    Overview:  Severe.  On bipap.  Sees Dr. Jerre Simon.      Allergic rhinitis   Exertional dyspnea    Allergies  Allergen Reactions   Hydromorphone Hcl Other (See Comments)    "flatlined" - Patient states he was overdosed. He states he isn't allergic.   Hydromorphone     Cardiac arrest   Aleve [Naproxen Sodium] Dermatitis    States he was given aleve in the hospital and it caused welts.     Immunization History  Administered Date(s) Administered   DT (Pediatric) 03/01/2008   Influenza, High Dose Seasonal PF 09/14/2010, 11/01/2015   Influenza, Seasonal, Injecte, Preservative Fre 10/12/2014   Influenza,trivalent, recombinat, inj, PF 09/18/2013   Influenza-Unspecified 09/24/2012   Pneumococcal Conjugate-13 11/23/2014   Pneumococcal  Polysaccharide-23 03/03/2012   Zoster, Live 09/18/2013    Past Medical History:  Diagnosis Date   Allergy    Arthritis    Asthma    when younger   GERD (gastroesophageal reflux disease)    Headache    Hyperlipidemia    Hypertension    Kidney stones    Myocardial infarction (HCC)    Sleep apnea    cpap    Tobacco History: Social History   Tobacco Use  Smoking Status Never  Smokeless Tobacco Never   Counseling given: Not Answered   Continue to not smoke  Outpatient Encounter Medications as of 10/03/2023  Medication Sig   aspirin EC 81 MG EC tablet Take 1 tablet (81 mg total) by mouth daily.   carvedilol (COREG) 12.5 MG tablet Take 1 tablet (12.5 mg total) by mouth 2 (two) times daily.   clopidogrel (PLAVIX) 75 MG tablet Take 1 tablet (75 mg total) by mouth daily.   finasteride (PROSCAR) 5 MG tablet Take 5 mg by mouth daily.   fluticasone (FLONASE) 50 MCG/ACT nasal spray Place 2 sprays into the nose daily as needed for allergies.    Fluticasone-Umeclidin-Vilant (TRELEGY ELLIPTA) 200-62.5-25 MCG/ACT AEPB Inhale 1 puff into the lungs daily.   isosorbide mononitrate (IMDUR) 60 MG 24 hr tablet Take 1 tablet (60 mg total) by mouth daily.   Multiple Vitamins-Minerals (CENTRUM SILVER 50+MEN PO) Take 1 tablet by mouth daily.   nitroGLYCERIN (NITROSTAT) 0.4 MG SL tablet Dissolve 1 tablet under the tongue every 5 minutes as needed for chest pain. Max of 3 doses, then 911.   REPATHA SURECLICK 140 MG/ML SOAJ INJECT ONE pen into THE SKIN every 14 DAYS   Semaglutide, 1 MG/DOSE, (OZEMPIC, 1 MG/DOSE,) 2 MG/1.5ML SOPN Inject 2 mg into the skin once a week.   tamsulosin (FLOMAX) 0.4 MG CAPS capsule Take 0.4 mg by mouth at bedtime.    [DISCONTINUED] albuterol (PROVENTIL HFA;VENTOLIN HFA) 108 (90 Base) MCG/ACT inhaler Inhale 2 puffs into the lungs every 6 (six) hours as needed for wheezing or shortness of breath.   albuterol (VENTOLIN HFA) 108 (90 Base) MCG/ACT inhaler Inhale 2 puffs into the  lungs every 6 (six) hours as needed for wheezing or shortness of breath.   [DISCONTINUED] ADVAIR HFA 115-21 MCG/ACT inhaler Inhale 2 puffs into the lungs 2 (two) times daily as needed (shortness of breath). (Patient not taking: Reported on 10/03/2023)   No facility-administered  encounter medications on file as of 10/03/2023.     Review of Systems  Review of Systems  No chest pain exertion.  No orthopnea or PND.  Comprehensive review of systems otherwise negative. Physical Exam  BP (!) 140/72 (BP Location: Left Arm, Patient Position: Sitting, Cuff Size: Large)   Pulse 73   Temp 97.9 F (36.6 C) (Oral)   Ht 6\' 2"  (1.88 m)   Wt 276 lb 9.6 oz (125.5 kg)   SpO2 97%   BMI 35.51 kg/m   Wt Readings from Last 5 Encounters:  10/03/23 276 lb 9.6 oz (125.5 kg)  10/01/23 273 lb 9.6 oz (124.1 kg)  07/09/23 273 lb 3.2 oz (123.9 kg)  06/18/22 299 lb (135.6 kg)  01/17/22 (!) 305 lb 9.6 oz (138.6 kg)    BMI Readings from Last 5 Encounters:  10/03/23 35.51 kg/m  10/01/23 35.13 kg/m  07/09/23 35.08 kg/m  06/18/22 38.39 kg/m  01/17/22 39.24 kg/m     Physical Exam General: Sitting in chair, no Eyes: EOMI, no icterus Neck: Supple, no JVP Cardiovascular: Regular and rhythm, no murmur Pulmonary: Clear, distant, normal work of breathing on room air Abdomen: Nondistended bowel sounds present MSK: No synovitis, no joint effusion Neuro: Normal gait, weakness Psych: Normal mood, full affect   Assessment & Plan:   Dyspnea on exertion: Chronic issue.  Even started in high school.  Present for several years as an adult now.  Prior cardiac workup indicated elevated LVEDP of 17 on left heart catheterization 05/2020.  He has CAD and has stenting.  TTE 07/2020 revealed aortic insufficiency.  Quite possible cardiac contributors.  In addition, given his shortness of breath described in high school, high suspicion for asthma.  Has been diagnosed with this in the past and treated in the past.  Does  not feel like inhalers help much.  Significant asbestosis exposure possible development of restrictive lung disease.  Chest x-ray today.  PFTs for further evaluation.  Asthma: Diagnosed in the past.  ICS/LABA inhaler is not very helpful.  Start high-dose Trelegy 1 puff daily.  Recommend albuterol use prior to exertion.  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  Chest x-ray today to establish baseline in our system and further surveillance.  Consider CT scan in the future.  OSA: Diagnosed on polysomnography.  Cannot tolerate CPAP.  Ongoing weight loss.  Home sleep test ordered today for further evaluation.  Prior test was many many years ago and he no longer has access to CPAP.  Return in about 3 months (around 01/03/2024) for f/u Dr. Judeth Horn.   Karren Burly, MD 10/03/2023   This appointment required 62 minutes of patient care (this includes precharting, chart review, review of results, face-to-face care, etc.).

## 2023-10-07 NOTE — Pre-Procedure Instructions (Signed)
Surgical Instructions   Your procedure is scheduled on Tuesday, October 29th. Report to [facility] Main Entrance "A" at 09:25 A.M., then check in with the Admitting office. Any questions or running late day of surgery: call [phone]  Questions prior to your surgery date: call [phone], Monday-Friday, 8am-4pm. If you experience any cold or flu symptoms such as cough, fever, chills, shortness of breath, etc. between now and your scheduled surgery, please notify us at the above number.     Remember:  Do not eat after midnight the night before your surgery  You may drink clear liquids until 08:25 AM the morning of your surgery.   Clear liquids allowed are: Water, Non-Citrus Juices (without pulp), Carbonated Beverages, Clear Tea, Black Coffee Only (NO MILK, CREAM OR POWDERED CREAMER of any kind), and Gatorade.  Patient Instructions  The night before surgery:  No food after midnight. ONLY clear liquids after midnight  The day of surgery (if you do NOT have diabetes):  Drink ONE (1) Pre-Surgery Clear Ensure by 08:25 AM the morning of surgery. Drink in one sitting. Do not sip.  This drink was given to you during your hospital  pre-op appointment visit.  Nothing else to drink after completing the  Pre-Surgery Clear Ensure.         If you have questions, please contact your surgeon's office.    Take these medicines the morning of surgery with A SIP OF WATER  carvedilol (COREG)  finasteride (PROSCAR)  Fluticasone-Umeclidin-Vilant (TRELEGY ELLIPTA)  isosorbide mononitrate (IMDUR)    May take these medicines IF NEEDED: albuterol (VENTOLIN HFA)- bring inhaler with you on day of surgery fluticasone (FLONASE)  nitroGLYCERIN (NITROSTAT)- if you have to take this medication prior to surgery, please call one of the above phone numbers and report this to a Nurse.    Hold Semaglutide for 7 days prior to surgery. Last dose on or before 10/21.  Hold clopidogrel (PLAVIX) 5 days prior  to surgery. Last dose will be 10/23.  Follow your surgeon's instructions on when to stop Aspirin.  If no instructions were given by your surgeon then you will need to call the office to get those instructions.    One week prior to surgery, STOP taking any Aspirin (unless otherwise instructed by your surgeon) Aleve, Naproxen, Ibuprofen, Motrin, Advil, Goody's, BC's, all herbal medications, fish oil, and non-prescription vitamins.                     Do NOT Smoke (Tobacco/Vaping) for 24 hours prior to your procedure.  If you use a CPAP at night, you may bring your mask/headgear for your overnight stay.   You will be asked to remove any contacts, glasses, piercing's, hearing aid's, dentures/partials prior to surgery. Please bring cases for these items if needed.    Patients discharged the day of surgery will not be allowed to drive home, and someone needs to stay with them for 24 hours.  SURGICAL WAITING ROOM VISITATION Patients may have no more than 2 support people in the waiting area - these visitors may rotate.   Pre-op nurse will coordinate an appropriate time for 1 ADULT support person, who may not rotate, to accompany patient in pre-op.  Children under the age of 81 must have an adult with them who is not the patient and must remain in the main waiting area with an adult.  If the patient needs to stay at the hospital during part of their recovery, the visitor guidelines for  inpatient rooms apply.  Please refer to the [facility] website for the visitor guidelines for any additional information.   If you received a COVID test during your pre-op visit  it is requested that you wear a mask when out in public, stay away from anyone that may not be feeling well and notify your surgeon if you develop symptoms. If you have been in contact with anyone that has tested positive in the last 10 days please notify you surgeon.      Pre-operative 5 CHG Bathing Instructions   You can play a key  role in reducing the risk of infection after surgery. Your skin needs to be as free of germs as possible. You can reduce the number of germs on your skin by washing with CHG (chlorhexidine gluconate) soap before surgery. CHG is an antiseptic soap that kills germs and continues to kill germs even after washing.   DO NOT use if you have an allergy to chlorhexidine/CHG or antibacterial soaps. If your skin becomes reddened or irritated, stop using the CHG and notify one of our RNs at [phone].   Please shower with the CHG soap starting 4 days before surgery using the following schedule:     Please keep in mind the following:  DO NOT shave, including legs and underarms, starting the day of your first shower.   You may shave your face at any point before/day of surgery.  Place clean sheets on your bed the day you start using CHG soap. Use a clean washcloth (not used since being washed) for each shower. DO NOT sleep with pets once you start using the CHG.   CHG Shower Instructions:  Wash your face and private area with normal soap. If you choose to wash your hair, wash first with your normal shampoo.  After you use shampoo/soap, rinse your hair and body thoroughly to remove shampoo/soap residue.  Turn the water OFF and apply about 3 tablespoons (45 ml) of CHG soap to a CLEAN washcloth.  Apply CHG soap ONLY FROM YOUR NECK DOWN TO YOUR TOES (washing for 3-5 minutes)  DO NOT use CHG soap on face, private areas, open wounds, or sores.  Pay special attention to the area where your surgery is being performed.  If you are having back surgery, having someone wash your back for you may be helpful. Wait 2 minutes after CHG soap is applied, then you may rinse off the CHG soap.  Pat dry with a clean towel  Put on clean clothes/pajamas   If you choose to wear lotion, please use ONLY the CHG-compatible lotions on the back of this paper.   Additional instructions for the day of surgery: DO NOT APPLY any  lotions, deodorants, cologne, or perfumes.   Do not bring valuables to the hospital. [facility] is not responsible for any belongings/valuables. Do not wear nail polish, gel polish, artificial nails, or any other type of covering on natural nails (fingers and toes) Do not wear jewelry or makeup Put on clean/comfortable clothes.  Please brush your teeth.  Ask your nurse before applying any prescription medications to the skin.     CHG Compatible Lotions   Aveeno Moisturizing lotion  Cetaphil Moisturizing Cream  Cetaphil Moisturizing Lotion  Clairol Herbal Essence Moisturizing Lotion, Dry Skin  Clairol Herbal Essence Moisturizing Lotion, Extra Dry Skin  Clairol Herbal Essence Moisturizing Lotion, Normal Skin  Curel Age Defying Therapeutic Moisturizing Lotion with Alpha Hydroxy  Curel Extreme Care Body Lotion  Curel Soothing  Hands Moisturizing Hand Lotion  Curel Therapeutic Moisturizing Cream, Fragrance-Free  Curel Therapeutic Moisturizing Lotion, Fragrance-Free  Curel Therapeutic Moisturizing Lotion, Original Formula  Eucerin Daily Replenishing Lotion  Eucerin Dry Skin Therapy Plus Alpha Hydroxy Crme  Eucerin Dry Skin Therapy Plus Alpha Hydroxy Lotion  Eucerin Original Crme  Eucerin Original Lotion  Eucerin Plus Crme Eucerin Plus Lotion  Eucerin TriLipid Replenishing Lotion  Keri Anti-Bacterial Hand Lotion  Keri Deep Conditioning Original Lotion Dry Skin Formula Softly Scented  Keri Deep Conditioning Original Lotion, Fragrance Free Sensitive Skin Formula  Keri Lotion Fast Absorbing Fragrance Free Sensitive Skin Formula  Keri Lotion Fast Absorbing Softly Scented Dry Skin Formula  Keri Original Lotion  Keri Skin Renewal Lotion Keri Silky Smooth Lotion  Keri Silky Smooth Sensitive Skin Lotion  Nivea Body Creamy Conditioning Oil  Nivea Body Extra Enriched Lotion  Nivea Body Original Lotion  Nivea Body Sheer Moisturizing Lotion Nivea Crme  Nivea Skin Firming Lotion   NutraDerm 30 Skin Lotion  NutraDerm Skin Lotion  NutraDerm Therapeutic Skin Cream  NutraDerm Therapeutic Skin Lotion  ProShield Protective Hand Cream  Provon moisturizing lotion  Please read over the following fact sheets that you were given.

## 2023-10-08 ENCOUNTER — Encounter (HOSPITAL_COMMUNITY): Payer: Self-pay

## 2023-10-08 ENCOUNTER — Other Ambulatory Visit: Payer: Self-pay

## 2023-10-08 ENCOUNTER — Encounter (HOSPITAL_COMMUNITY)
Admission: RE | Admit: 2023-10-08 | Discharge: 2023-10-08 | Disposition: A | Discharge status: Home / Self Care | Payer: PPO | Source: Ambulatory Visit | Attending: Orthopedic Surgery | Admitting: Orthopedic Surgery

## 2023-10-08 VITALS — BP 115/66 | HR 81 | Temp 98.0°F | Resp 18 | Ht 74.0 in | Wt 274.2 lb

## 2023-10-08 DIAGNOSIS — E669 Obesity, unspecified: Secondary | ICD-10-CM | POA: Insufficient documentation

## 2023-10-08 DIAGNOSIS — M1990 Unspecified osteoarthritis, unspecified site: Secondary | ICD-10-CM | POA: Diagnosis not present

## 2023-10-08 DIAGNOSIS — G4733 Obstructive sleep apnea (adult) (pediatric): Secondary | ICD-10-CM | POA: Insufficient documentation

## 2023-10-08 DIAGNOSIS — I252 Old myocardial infarction: Secondary | ICD-10-CM | POA: Diagnosis not present

## 2023-10-08 DIAGNOSIS — R0600 Dyspnea, unspecified: Secondary | ICD-10-CM | POA: Diagnosis not present

## 2023-10-08 DIAGNOSIS — M19011 Primary osteoarthritis, right shoulder: Secondary | ICD-10-CM | POA: Insufficient documentation

## 2023-10-08 DIAGNOSIS — I10 Essential (primary) hypertension: Secondary | ICD-10-CM | POA: Insufficient documentation

## 2023-10-08 DIAGNOSIS — J45909 Unspecified asthma, uncomplicated: Secondary | ICD-10-CM | POA: Insufficient documentation

## 2023-10-08 DIAGNOSIS — Z6835 Body mass index (BMI) 35.0-35.9, adult: Secondary | ICD-10-CM | POA: Insufficient documentation

## 2023-10-08 DIAGNOSIS — Z01812 Encounter for preprocedural laboratory examination: Secondary | ICD-10-CM | POA: Insufficient documentation

## 2023-10-08 DIAGNOSIS — Z01818 Encounter for other preprocedural examination: Secondary | ICD-10-CM

## 2023-10-08 DIAGNOSIS — Z96653 Presence of artificial knee joint, bilateral: Secondary | ICD-10-CM | POA: Insufficient documentation

## 2023-10-08 DIAGNOSIS — E785 Hyperlipidemia, unspecified: Secondary | ICD-10-CM | POA: Insufficient documentation

## 2023-10-08 DIAGNOSIS — I2510 Atherosclerotic heart disease of native coronary artery without angina pectoris: Secondary | ICD-10-CM | POA: Insufficient documentation

## 2023-10-08 HISTORY — DX: Pneumonia, unspecified organism: J18.9

## 2023-10-08 HISTORY — DX: Dyspnea, unspecified: R06.00

## 2023-10-08 HISTORY — DX: Atherosclerotic heart disease of native coronary artery without angina pectoris: I25.10

## 2023-10-08 LAB — BASIC METABOLIC PANEL
Anion gap: 9 (ref 5–15)
BUN: 11 mg/dL (ref 8–23)
CO2: 25 mmol/L (ref 22–32)
Calcium: 9.7 mg/dL (ref 8.9–10.3)
Chloride: 103 mmol/L (ref 98–111)
Creatinine, Ser: 0.93 mg/dL (ref 0.61–1.24)
GFR, Estimated: >60 mL/min (ref >60)
Glucose, Bld: 104 mg/dL — ABNORMAL HIGH (ref 70–99)
Potassium: 4.2 mmol/L (ref 3.5–5.1)
Sodium: 137 mmol/L (ref 135–145)

## 2023-10-08 LAB — URINALYSIS, W/ REFLEX TO CULTURE (INFECTION SUSPECTED)
Bacteria, UA: NONE SEEN
Bilirubin Urine: NEGATIVE
Glucose, UA: NEGATIVE mg/dL
Hgb urine dipstick: NEGATIVE
Ketones, ur: NEGATIVE mg/dL
Leukocytes,Ua: NEGATIVE
Nitrite: NEGATIVE
Protein, ur: NEGATIVE mg/dL
Specific Gravity, Urine: 1.016 (ref 1.005–1.030)
pH: 5 (ref 5.0–8.0)

## 2023-10-08 LAB — CBC
HCT: 46.2 % (ref 39.0–52.0)
Hemoglobin: 15.7 g/dL (ref 13.0–17.0)
MCH: 30.7 pg (ref 26.0–34.0)
MCHC: 34 g/dL (ref 30.0–36.0)
MCV: 90.2 fL (ref 80.0–100.0)
Platelets: 167 10*3/uL (ref 150–400)
RBC: 5.12 MIL/uL (ref 4.22–5.81)
RDW: 13.8 % (ref 11.5–15.5)
WBC: 7 10*3/uL (ref 4.0–10.5)
nRBC: 0 % (ref 0.0–0.2)

## 2023-10-08 LAB — SURGICAL PCR SCREEN
MRSA, PCR: NEGATIVE
Staphylococcus aureus: NEGATIVE

## 2023-10-08 NOTE — Progress Notes (Signed)
PCP - Dr. Joycelyn Rua Cardiologist - Dr. Kristeen Miss Pulmonologist- Dr. Vilma Meckel  PPM/ICD - denies   Chest x-ray - 10/03/23- pt states Dr. Judeth Horn ordered this r/t his shortness of breath. Results are still pending EKG - 10/01/23 Stress Test - 04/01/14 ECHO - 08/11/20 Cardiac Cath - 05/31/20  Sleep Study - OSA+, pt states he has lost weight since being diagnosed and no longer has issues.  CPAP - denies  DM- denies  Blood Thinner Instructions: Hold Plavix 5 days. Last dose 10/23 Aspirin Instructions: f/u with surgeon  ERAS Protcol -yes PRE-SURGERY G2- given at PAT  COVID TEST- n/a   Anesthesia review: yes, cardiac hx, CXR pending  Patient denies shortness of breath, fever, cough and chest pain at PAT appointment   All instructions explained to the patient, with a verbal understanding of the material. Patient agrees to go over the instructions while at home for a better understanding.  The opportunity to ask questions was provided.

## 2023-10-08 NOTE — Pre-Procedure Instructions (Signed)
Benzoyl Peroxide Gel  o Reduces the number of germs present on the skin  o Applied twice a day to shoulder area starting two days before surgery  ==================================================================  Please follow these instructions carefully:  BENZOYL PEROXIDE 5% GEL  Please do not use if you have an allergy to benzoyl peroxide. If your skin becomes reddened/irritated stop using the benzoyl peroxide.  Starting two days before surgery, apply as follows:  1. Apply benzoyl peroxide in the morning and at night. Apply after taking a shower. If you are not taking a shower clean entire shoulder front, back, and side along with the armpit with a clean wet washcloth.  2. Place a quarter-sized dollop on your SHOULDER and rub in thoroughly, making sure to cover the front, back, and side of your shoulder, along with the armpit.   2 Days prior to Surgery First Dose on _____________ Morning Second Dose on ______________ Night  Day Before Surgery First Dose on ______________ Morning Night before surgery wash (entire body except face and private areas) with CHG Soap THEN Second Dose on ____________ Night   Morning of Surgery  wash BODY AGAIN with CHG Soap   4. Do NOT apply benzoyl peroxide gel on the day of surgery

## 2023-10-09 NOTE — Progress Notes (Addendum)
Anesthesia Chart Review:  Case: [id number] Date/Time: 10/22/23 1111   Procedures:      RIGHT REVERSE SHOULDER ARTHROPLASTY (Right: Shoulder)     BICEPS TENODESIS (Right: Shoulder)   Anesthesia type: General   Pre-op diagnosis: right shoulder osteoarthritis, biceps tendinitis   [facility]: [facility] ROOM 06 / [facility]   Surgeons: Cammy Copa, MD       DISCUSSION: Patient is a 77 year old male scheduled for the above procedure.  History includes never smoker, HTN, HLD, CAD (NSTEMI, s/p DES AV groove CX 05/20/16), asthma, chronic dyspnea, OSA (intolerance to CPAP), osteoarthritis (bilateral TKA). BMI is consistent with obesity.  Preoperative cardiology evaluation on 10/01/23 by Rise Paganini, NP, "Preoperative cardiovascular examination [name] perioperative risk of a major cardiovascular event is  0.9% according to the revised cardiac risk index (RCRI).  Therefore he is at low risk for complications.  His functional capacity is good at 7.25 METs according to the Duke Activity Status Index (DASI). Recommendations: According to ACC/AHA guidelines, no further cardiovascular testing needed.  The patient may proceed to surgery at acceptable risk.   Antiplatelet and/or Anticoagulation Recommendations: Clopidogrel (Plavix) can be held for 5 days prior to his surgery and resumed as soon as possible post op. Ideally, Aspirin 81mg  should be continued without interruption unless bleeding risk is too high".  1 year follow-up planned.   He recently was evaluated by pulmonologist Vilma Meckel, MD on 10/03/23 for long term DOE, present since high school but worse in the past 4-5 years. Previously did not think inhalers helped much. 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 Prior cardiac workup indicated elevated LVEDP of 17 on left heart catheterization 05/2020. He has CAD and has stenting. TTE 07/2020 revealed LVEF 55-60%, normal RV systolic function, trivial AI. Lungs sounds clear, distant by his exam with  normal work of breathing. CXR ordered to establish baseline and for further surveillance. Consider CT scan in the future. Recommended start high-dose Trelegy 1 puff daily and albuterol use prior to exercise. 3 month follow-up with PFT recommended. Updated sleep study ordered.   Patient will likely be considered for general anesthesia with interscalene block. 10/03/23 report is pending, but does not appear to have any significant diaphragm elevation on the right. Discussed with anesthesiologist Lewie Loron, MD. Would recommend preoperative pulmonology input. I will communicate to Dr. August Saucer or his staff.   ADDENDUM 10/17/24 5:09 PM: Preoperative pulmonology risk assessment by Dr. Judeth Horn:  "Preoperative evaluation: Pulmonary medicine does not provide a preoperative clearance rather preoperative risk assessment. Based on the ARISCAT, patient is low or 1.6% risk of postoperative pulmonary complication assume duration of surgery is less than 3 hours. If duration of surgery is greater than 3 hours patient is intermediate or 13.3% risk of postoperative pulmonary complication. There are no modifiable risk factors to address prior to surgery."   VS: BP 115/66   Pulse 81   Temp 36.7 C (Oral)   Resp 18   Ht 6\' 2"  (1.88 m)   Wt 124.4 kg   SpO2 98%   BMI 35.21 kg/m    PROVIDERS: Joycelyn Rua, MD PCP Kristeen Miss, MD is cardiologist Vilma Meckel, MD is pulmonologist   LABS: Labs reviewed: Acceptable for surgery. (all labs ordered are listed, but only abnormal results are displayed)  Labs Reviewed  BASIC METABOLIC PANEL - Abnormal; Notable for the following components:      Result Value   Glucose, Bld 104 (*)    All other components within normal limits  URINALYSIS,  W/ REFLEX TO CULTURE (INFECTION SUSPECTED) - Abnormal; Notable for the following components:   APPearance HAZY (*)    All other components within normal limits  SURGICAL PCR SCREEN  CBC     IMAGES: CXR  10/03/23: Ordered by Dr. Judeth Horn. Images available. No report to review.     CT Right shoulder 08/20/23: IMPRESSION: 1. Advanced right glenohumeral degenerative changes with joint effusion and a small intra-articular loose body. 2. No acute osseous findings. 3. Mild subscapularis muscular atrophy. No other significant rotator cuff muscular atrophy identified. 4. Mild acromioclavicular degenerative changes. 5.  Aortic Atherosclerosis (ICD10-I70.0).     EKG: 10/01/2023: Normal sinus rhythm Left axis deviation Right bundle branch block Inferior Q waves noted No significant change was found Confirmed by Rise Paganini [id number]) on 10/01/2023 9:20:09 AM   CV: Echo 08/11/2020: IMPRESSIONS   1. Left ventricular ejection fraction, by estimation, is 55 to 60%. The  left ventricle has normal function. The left ventricle has no regional  wall motion abnormalities. Left ventricular diastolic parameters are  indeterminate.   2. Right ventricular systolic function is normal. The right ventricular  size is normal. Tricuspid regurgitation signal is inadequate for assessing  PA pressure.   3. The mitral valve is normal in structure. No evidence of mitral valve  regurgitation. No evidence of mitral stenosis.   4. The aortic valve is tricuspid. Aortic valve regurgitation is trivial.  No aortic stenosis is present.   5. The inferior vena cava is normal in size with greater than 50%  respiratory variability, suggesting right atrial pressure of 3 mmHg.  - Comparison(s): No significant change from prior study. 05/19/16 EF 60-65%.    Cardiac cath 05/31/2020: RPDA-1 lesion is 75% stenosed. RPDA-2 lesion is 50% stenosed. Ost LAD to Prox LAD lesion is 40% stenosed. Prox RCA-1 lesion is 20% stenosed. Non-stenotic Prox RCA-2 lesion. 1st Diag lesion is 40% stenosed. Prox LAD to Mid LAD lesion is 30% stenosed. Non-stenotic Mid LAD-1 lesion. Mid LAD-2 lesion is 50% stenosed. 2nd Diag lesion is 50%  stenosed. Dist LAD-1 lesion is 50% stenosed. Dist LAD-2 lesion is 75% stenosed. 1st Mrg-1 lesion is 20% stenosed. 1st Mrg-2 lesion is 20% stenosed. Previously placed Prox Cx to Mid Cx stent (unknown type) is widely patent.   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  There is diffuse 50% stenoses in the mid LAD second diagonal vessel and focal 75% apical LAD stenosis and small caliber apical segment.  The left circumflex stent is widely patent.  There is 20% narrowing in the OM1 vessel.  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  There is 7075% ostial stenosis of the PDA vessel with diffuse 50% mid stenosis not significantly changed from the 2017 angiographic studies.   Low normal LV function with EF estimated 50 to 55%.  LVEDP 17 mm.   RECOMMENDATION: Patient has been on DAPT therapy with aspirin/Plavix.  Recommend medical therapy for his CAD.  Recommend 2D echo Doppler study for reassessment of systolic as well as diastolic function and valvular architecture.  Aggressive lipid-lowering therapy with target LDL less than 70 and optimal blood pressure control target less than 130/80.    Past Medical History:  Diagnosis Date   Allergy    Arthritis    Asthma    when younger  Coronary artery disease    s/p stenting 10/2016   Dyspnea    GERD (gastroesophageal reflux disease)    Headache    Hyperlipidemia    Hypertension    Kidney stones    Myocardial infarction (HCC)    Pneumonia    hx of   Sleep apnea    cpap    Past Surgical History:  Procedure Laterality Date   CARDIAC CATHETERIZATION N/A 05/20/2016   Procedure: Left Heart Cath and Coronary Angiography;  Surgeon: Runell Gess, MD;  Location: [facility] INVASIVE CV LAB;  Service: Cardiovascular;  Laterality: N/A;   CARDIAC  CATHETERIZATION N/A 11/22/2016   Procedure: Left Heart Cath and Coronary Angiography;  Surgeon: Yvonne Kendall, MD;  Location: [facility] INVASIVE CV LAB;  Service: Cardiovascular;  Laterality: N/A;   CARPAL TUNNEL RELEASE Right    COLONOSCOPY     EYE SURGERY Left    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, 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   INGUINAL HERNIA REPAIR  1955   LEFT HEART CATH AND CORONARY ANGIOGRAPHY N/A 05/31/2020   Procedure: LEFT HEART CATH AND CORONARY ANGIOGRAPHY;  Surgeon: Lennette Bihari, MD;  Location: [facility] INVASIVE CV LAB;  Service: Cardiovascular;  Laterality: N/A;   RECTAL SURGERY     REPLACEMENT TOTAL KNEE Bilateral    TONSILLECTOMY      MEDICATIONS:  albuterol (VENTOLIN HFA) 108 (90 Base) MCG/ACT inhaler   aspirin EC 81 MG EC tablet   carvedilol (COREG) 12.5 MG tablet   clopidogrel (PLAVIX) 75 MG tablet   finasteride (PROSCAR) 5 MG tablet   fluticasone (FLONASE) 50 MCG/ACT nasal spray   Fluticasone-Umeclidin-Vilant (TRELEGY ELLIPTA) 200-62.5-25 MCG/ACT AEPB   isosorbide mononitrate (IMDUR) 60 MG 24 hr tablet   Multiple Vitamins-Minerals (CENTRUM SILVER 50+MEN PO)   nitroGLYCERIN (NITROSTAT) 0.4 MG SL tablet   REPATHA SURECLICK 140 MG/ML SOAJ   Semaglutide, 1 MG/DOSE, (OZEMPIC, 1 MG/DOSE,) 2 MG/1.5ML SOPN   tamsulosin (FLOMAX) 0.4 MG CAPS capsule   No current facility-administered medications for this encounter.    Shonna Chock, PA-C Surgical Short Stay/Anesthesiology [facility] Phone [phone] [facility] Phone [phone] 10/09/2023 6:45 PM

## 2023-10-17 ENCOUNTER — Telehealth: Payer: Self-pay | Admitting: Pulmonary Disease

## 2023-10-17 NOTE — Telephone Encounter (Signed)
Fax received from Violet Baldy with Cyndia Skeeters at [facility] to perform a right reserve shoulder arthroplasty under general anesthesia with interscalene clock on patient.  Patient needs surgery clearance. Surgery is 10/22/23. Patient was seen on 10/03/23. Office protocol is a risk assessment can be sent to surgeon if patient has been seen in 60 days or less.   Sending to Dr Judeth Horn for risk assessment or recommendations if patient needs to be seen in office prior to surgical procedure.

## 2023-10-18 DIAGNOSIS — G4730 Sleep apnea, unspecified: Secondary | ICD-10-CM | POA: Diagnosis not present

## 2023-10-18 DIAGNOSIS — G4733 Obstructive sleep apnea (adult) (pediatric): Secondary | ICD-10-CM

## 2023-10-18 NOTE — Telephone Encounter (Signed)
Preoperative evaluation: Pulmonary medicine does not provide a preoperative clearance rather preoperative risk assessment.  Based on the ARISCAT, patient is low or 1.6% risk of postoperative pulmonary complication assume duration of surgery is less than 3 hours.  If duration of surgery is greater than 3 hours patient is intermediate or 13.3% risk of postoperative pulmonary complication.  There are no modifiable risk factors to address prior to surgery.

## 2023-10-18 NOTE — Anesthesia Preprocedure Evaluation (Signed)
Anesthesia Evaluation    Airway        Dental   Pulmonary           Cardiovascular hypertension,      Neuro/Psych    GI/Hepatic   Endo/Other    Renal/GU      Musculoskeletal   Abdominal   Peds  Hematology   Anesthesia Other Findings   Reproductive/Obstetrics                             Anesthesia Physical Anesthesia Plan  ASA:   Anesthesia Plan:    Post-op Pain Management:    Induction:   PONV Risk Score and Plan:   Airway Management Planned:   Additional Equipment:   Intra-op Plan:   Post-operative Plan:   Informed Consent:   Plan Discussed with:   Anesthesia Plan Comments: (PAT note written by Tambria Pfannenstiel, PA-C.  )       Anesthesia Quick Evaluation  

## 2023-10-21 NOTE — Progress Notes (Signed)
Patient was called to be informed that the surgery time for tomorrow was changed to 07:30 o'clock. Patient was instructed to be at the hospital at 05:30 o'clock and stop drinking clear liquids at 04:30 o'clock. Patient verbalized understanding.

## 2023-10-21 NOTE — Telephone Encounter (Signed)
Copy of this note faxed to [facility]

## 2023-10-22 ENCOUNTER — Observation Stay (HOSPITAL_COMMUNITY): Payer: Self-pay | Admitting: Anesthesiology

## 2023-10-22 ENCOUNTER — Encounter (HOSPITAL_COMMUNITY)
Admission: RE | Disposition: A | Discharge status: Home / Self Care | Payer: Self-pay | Source: Home / Self Care | Attending: Orthopedic Surgery

## 2023-10-22 ENCOUNTER — Observation Stay (HOSPITAL_COMMUNITY): Payer: PPO | Admitting: Vascular Surgery

## 2023-10-22 ENCOUNTER — Observation Stay (HOSPITAL_COMMUNITY): Payer: PPO

## 2023-10-22 ENCOUNTER — Encounter (HOSPITAL_COMMUNITY): Payer: Self-pay | Admitting: Orthopedic Surgery

## 2023-10-22 ENCOUNTER — Other Ambulatory Visit: Payer: Self-pay

## 2023-10-22 ENCOUNTER — Observation Stay (HOSPITAL_COMMUNITY)
Admission: RE | Admit: 2023-10-22 | Discharge: 2023-10-23 | Disposition: A | Discharge status: Home / Self Care | Payer: PPO | Attending: Orthopedic Surgery | Admitting: Orthopedic Surgery

## 2023-10-22 DIAGNOSIS — G8918 Other acute postprocedural pain: Secondary | ICD-10-CM | POA: Diagnosis not present

## 2023-10-22 DIAGNOSIS — Z79899 Other long term (current) drug therapy: Secondary | ICD-10-CM | POA: Insufficient documentation

## 2023-10-22 DIAGNOSIS — Z7902 Long term (current) use of antithrombotics/antiplatelets: Secondary | ICD-10-CM | POA: Insufficient documentation

## 2023-10-22 DIAGNOSIS — Z7982 Long term (current) use of aspirin: Secondary | ICD-10-CM | POA: Diagnosis not present

## 2023-10-22 DIAGNOSIS — M19011 Primary osteoarthritis, right shoulder: Secondary | ICD-10-CM

## 2023-10-22 DIAGNOSIS — I2510 Atherosclerotic heart disease of native coronary artery without angina pectoris: Secondary | ICD-10-CM | POA: Insufficient documentation

## 2023-10-22 DIAGNOSIS — Z471 Aftercare following joint replacement surgery: Secondary | ICD-10-CM | POA: Diagnosis not present

## 2023-10-22 DIAGNOSIS — Z01818 Encounter for other preprocedural examination: Principal | ICD-10-CM

## 2023-10-22 DIAGNOSIS — I10 Essential (primary) hypertension: Secondary | ICD-10-CM | POA: Insufficient documentation

## 2023-10-22 DIAGNOSIS — Z96611 Presence of right artificial shoulder joint: Secondary | ICD-10-CM | POA: Diagnosis not present

## 2023-10-22 DIAGNOSIS — Z96653 Presence of artificial knee joint, bilateral: Secondary | ICD-10-CM | POA: Diagnosis not present

## 2023-10-22 DIAGNOSIS — M7521 Bicipital tendinitis, right shoulder: Secondary | ICD-10-CM

## 2023-10-22 DIAGNOSIS — J45909 Unspecified asthma, uncomplicated: Secondary | ICD-10-CM | POA: Diagnosis not present

## 2023-10-22 DIAGNOSIS — M19019 Primary osteoarthritis, unspecified shoulder: Secondary | ICD-10-CM | POA: Diagnosis present

## 2023-10-22 HISTORY — PX: BICEPT TENODESIS: SHX5116

## 2023-10-22 HISTORY — PX: REVERSE SHOULDER ARTHROPLASTY: SHX5054

## 2023-10-22 SURGERY — ARTHROPLASTY, SHOULDER, TOTAL, REVERSE
Anesthesia: General | Site: Shoulder | Laterality: Right

## 2023-10-22 MED ORDER — ONDANSETRON HCL 4 MG/2ML IJ SOLN
INTRAMUSCULAR | Status: AC
  Filled 2023-10-22: qty 2

## 2023-10-22 MED ORDER — IRRISEPT - 450ML BOTTLE WITH 0.05% CHG IN STERILE WATER, USP 99.95% OPTIME
TOPICAL | Status: DC | PRN
  Administered 2023-10-22: 450 mL

## 2023-10-22 MED ORDER — ONDANSETRON HCL 4 MG/2ML IJ SOLN
INTRAMUSCULAR | Status: DC | PRN
  Administered 2023-10-22: 4 mg via INTRAVENOUS

## 2023-10-22 MED ORDER — PHENYLEPHRINE 80 MCG/ML (10ML) SYRINGE FOR IV PUSH (FOR BLOOD PRESSURE SUPPORT)
PREFILLED_SYRINGE | INTRAVENOUS | Status: AC
  Filled 2023-10-22: qty 10

## 2023-10-22 MED ORDER — FENTANYL CITRATE (PF) 250 MCG/5ML IJ SOLN
INTRAMUSCULAR | Status: AC
  Filled 2023-10-22: qty 5

## 2023-10-22 MED ORDER — EPHEDRINE 5 MG/ML INJ
INTRAVENOUS | Status: AC
  Filled 2023-10-22: qty 5

## 2023-10-22 MED ORDER — ACETAMINOPHEN 500 MG PO TABS
1000.0000 mg | ORAL_TABLET | Freq: Four times a day (QID) | ORAL | Status: AC
  Administered 2023-10-22 – 2023-10-23 (×4): 1000 mg via ORAL
  Filled 2023-10-22 (×4): qty 2

## 2023-10-22 MED ORDER — NITROGLYCERIN 0.4 MG SL SUBL: 0.4000 mg | SUBLINGUAL_TABLET | SUBLINGUAL | Status: DC | PRN

## 2023-10-22 MED ORDER — ACETAMINOPHEN 325 MG PO TABS: 325.0000 mg | ORAL_TABLET | ORAL | Status: DC | PRN

## 2023-10-22 MED ORDER — ACETAMINOPHEN 500 MG PO TABS
ORAL_TABLET | ORAL | Status: AC
  Administered 2023-10-22: 1000 mg via ORAL
  Filled 2023-10-22: qty 2

## 2023-10-22 MED ORDER — ALBUTEROL SULFATE HFA 108 (90 BASE) MCG/ACT IN AERS: 2.0000 | INHALATION_SPRAY | Freq: Four times a day (QID) | RESPIRATORY_TRACT | Status: DC | PRN

## 2023-10-22 MED ORDER — ACETAMINOPHEN 500 MG PO TABS: 1000.0000 mg | ORAL_TABLET | Freq: Once | ORAL | Status: AC

## 2023-10-22 MED ORDER — OXYCODONE HCL 5 MG PO TABS: 5.0000 mg | ORAL_TABLET | Freq: Once | ORAL | Status: DC | PRN

## 2023-10-22 MED ORDER — METHOCARBAMOL 500 MG PO TABS
500.0000 mg | ORAL_TABLET | Freq: Four times a day (QID) | ORAL | Status: DC | PRN
  Administered 2023-10-22: 500 mg via ORAL
  Filled 2023-10-22: qty 1

## 2023-10-22 MED ORDER — OXYCODONE HCL 5 MG PO TABS
5.0000 mg | ORAL_TABLET | ORAL | Status: DC | PRN
  Administered 2023-10-22 – 2023-10-23 (×3): 5 mg via ORAL
  Filled 2023-10-22 (×3): qty 1

## 2023-10-22 MED ORDER — TRANEXAMIC ACID-NACL 1000-0.7 MG/100ML-% IV SOLN: 1000.0000 mg | INTRAVENOUS | Status: DC

## 2023-10-22 MED ORDER — MENTHOL 3 MG MT LOZG: 1.0000 | LOZENGE | OROMUCOSAL | Status: DC | PRN

## 2023-10-22 MED ORDER — POVIDONE-IODINE 7.5 % EX SOLN
Freq: Once | CUTANEOUS | Status: DC
  Filled 2023-10-22: qty 118

## 2023-10-22 MED ORDER — ROCURONIUM BROMIDE 10 MG/ML (PF) SYRINGE
PREFILLED_SYRINGE | INTRAVENOUS | Status: AC
  Filled 2023-10-22: qty 10

## 2023-10-22 MED ORDER — CARVEDILOL 12.5 MG PO TABS
12.5000 mg | ORAL_TABLET | Freq: Two times a day (BID) | ORAL | Status: DC
  Administered 2023-10-22 – 2023-10-23 (×2): 12.5 mg via ORAL
  Filled 2023-10-22 (×2): qty 1

## 2023-10-22 MED ORDER — MEPERIDINE HCL 25 MG/ML IJ SOLN: 6.2500 mg | INTRAMUSCULAR | Status: DC | PRN

## 2023-10-22 MED ORDER — OXYCODONE HCL 5 MG/5ML PO SOLN: 5.0000 mg | Freq: Once | ORAL | Status: DC | PRN

## 2023-10-22 MED ORDER — CEFAZOLIN IN SODIUM CHLORIDE 3-0.9 GM/100ML-% IV SOLN
INTRAVENOUS | Status: AC
  Filled 2023-10-22: qty 100

## 2023-10-22 MED ORDER — DEXAMETHASONE SODIUM PHOSPHATE 10 MG/ML IJ SOLN
INTRAMUSCULAR | Status: AC
  Filled 2023-10-22: qty 1

## 2023-10-22 MED ORDER — CHLORHEXIDINE GLUCONATE 0.12 % MT SOLN: 15.0000 mL | Freq: Once | OROMUCOSAL | Status: AC

## 2023-10-22 MED ORDER — SUGAMMADEX SODIUM 200 MG/2ML IV SOLN
INTRAVENOUS | Status: DC | PRN
  Administered 2023-10-22: 100 mg via INTRAVENOUS

## 2023-10-22 MED ORDER — PHENYLEPHRINE HCL-NACL 20-0.9 MG/250ML-% IV SOLN
INTRAVENOUS | Status: DC | PRN
  Administered 2023-10-22: 50 ug/min via INTRAVENOUS

## 2023-10-22 MED ORDER — LACTATED RINGERS IV SOLN: INTRAVENOUS | Status: DC | PRN

## 2023-10-22 MED ORDER — POVIDONE-IODINE 10 % EX SWAB
2.0000 | Freq: Once | CUTANEOUS | Status: AC
  Administered 2023-10-22: 2 via TOPICAL

## 2023-10-22 MED ORDER — LIDOCAINE 2% (20 MG/ML) 5 ML SYRINGE
INTRAMUSCULAR | Status: DC | PRN
  Administered 2023-10-22: 100 mg via INTRAVENOUS

## 2023-10-22 MED ORDER — ONDANSETRON HCL 4 MG PO TABS: 4.0000 mg | ORAL_TABLET | Freq: Four times a day (QID) | ORAL | Status: DC | PRN

## 2023-10-22 MED ORDER — DEXAMETHASONE SODIUM PHOSPHATE 10 MG/ML IJ SOLN
INTRAMUSCULAR | Status: DC | PRN
  Administered 2023-10-22: 10 mg via INTRAVENOUS

## 2023-10-22 MED ORDER — MIDAZOLAM HCL 2 MG/2ML IJ SOLN
INTRAMUSCULAR | Status: DC | PRN
  Administered 2023-10-22: 2 mg via INTRAVENOUS

## 2023-10-22 MED ORDER — VANCOMYCIN HCL 1000 MG IV SOLR
INTRAVENOUS | Status: DC | PRN
  Administered 2023-10-22: 1000 mg via TOPICAL

## 2023-10-22 MED ORDER — 0.9 % SODIUM CHLORIDE (POUR BTL) OPTIME
TOPICAL | Status: DC | PRN
  Administered 2023-10-22: 1000 mL

## 2023-10-22 MED ORDER — ALBUTEROL SULFATE (2.5 MG/3ML) 0.083% IN NEBU: 2.5000 mg | INHALATION_SOLUTION | Freq: Four times a day (QID) | RESPIRATORY_TRACT | Status: DC | PRN

## 2023-10-22 MED ORDER — VANCOMYCIN HCL 1000 MG IV SOLR
INTRAVENOUS | Status: AC
  Filled 2023-10-22: qty 20

## 2023-10-22 MED ORDER — METOCLOPRAMIDE HCL 5 MG PO TABS: 5.0000 mg | ORAL_TABLET | Freq: Three times a day (TID) | ORAL | Status: DC | PRN

## 2023-10-22 MED ORDER — METHOCARBAMOL 1000 MG/10ML IJ SOLN: 500.0000 mg | Freq: Four times a day (QID) | INTRAMUSCULAR | Status: DC | PRN

## 2023-10-22 MED ORDER — TRANEXAMIC ACID-NACL 1000-0.7 MG/100ML-% IV SOLN
INTRAVENOUS | Status: AC
  Filled 2023-10-22: qty 100

## 2023-10-22 MED ORDER — MIDAZOLAM HCL 2 MG/2ML IJ SOLN
INTRAMUSCULAR | Status: AC
  Filled 2023-10-22: qty 2

## 2023-10-22 MED ORDER — DOCUSATE SODIUM 100 MG PO CAPS
100.0000 mg | ORAL_CAPSULE | Freq: Two times a day (BID) | ORAL | Status: DC
  Administered 2023-10-22 – 2023-10-23 (×3): 100 mg via ORAL
  Filled 2023-10-22 (×3): qty 1

## 2023-10-22 MED ORDER — BUPIVACAINE HCL (PF) 0.5 % IJ SOLN
INTRAMUSCULAR | Status: DC | PRN
  Administered 2023-10-22: 10 mL via PERINEURAL

## 2023-10-22 MED ORDER — PROPOFOL 10 MG/ML IV BOLUS
INTRAVENOUS | Status: DC | PRN
  Administered 2023-10-22: 170 mg via INTRAVENOUS
  Administered 2023-10-22: 30 mg via INTRAVENOUS

## 2023-10-22 MED ORDER — ONDANSETRON HCL 4 MG/2ML IJ SOLN
4.0000 mg | Freq: Four times a day (QID) | INTRAMUSCULAR | Status: DC | PRN
Start: 2023-10-22 — End: 2023-10-23

## 2023-10-22 MED ORDER — GLYCOPYRROLATE 0.2 MG/ML IJ SOLN
INTRAMUSCULAR | Status: DC | PRN
  Administered 2023-10-22: .2 mg via INTRAVENOUS

## 2023-10-22 MED ORDER — LIDOCAINE 2% (20 MG/ML) 5 ML SYRINGE
INTRAMUSCULAR | Status: AC
  Filled 2023-10-22: qty 5

## 2023-10-22 MED ORDER — EPHEDRINE SULFATE-NACL 50-0.9 MG/10ML-% IV SOSY
PREFILLED_SYRINGE | INTRAVENOUS | Status: DC | PRN
  Administered 2023-10-22: 15 mg via INTRAVENOUS

## 2023-10-22 MED ORDER — ROCURONIUM BROMIDE 10 MG/ML (PF) SYRINGE
PREFILLED_SYRINGE | INTRAVENOUS | Status: DC | PRN
  Administered 2023-10-22: 50 mg via INTRAVENOUS

## 2023-10-22 MED ORDER — CLOPIDOGREL BISULFATE 75 MG PO TABS
75.0000 mg | ORAL_TABLET | Freq: Every day | ORAL | Status: DC
  Administered 2023-10-22 – 2023-10-23 (×2): 75 mg via ORAL
  Filled 2023-10-22 (×3): qty 1

## 2023-10-22 MED ORDER — CHLORHEXIDINE GLUCONATE 0.12 % MT SOLN
OROMUCOSAL | Status: AC
  Administered 2023-10-22: 15 mL via OROMUCOSAL
  Filled 2023-10-22: qty 15

## 2023-10-22 MED ORDER — FENTANYL CITRATE (PF) 250 MCG/5ML IJ SOLN
INTRAMUSCULAR | Status: DC | PRN
  Administered 2023-10-22 (×3): 50 ug via INTRAVENOUS
  Administered 2023-10-22: 100 ug via INTRAVENOUS

## 2023-10-22 MED ORDER — PHENOL 1.4 % MT LIQD: 1.0000 | OROMUCOSAL | Status: DC | PRN

## 2023-10-22 MED ORDER — PROPOFOL 10 MG/ML IV BOLUS
INTRAVENOUS | Status: AC
  Filled 2023-10-22: qty 20

## 2023-10-22 MED ORDER — ACETAMINOPHEN 160 MG/5ML PO SOLN: 325.0000 mg | ORAL | Status: DC | PRN

## 2023-10-22 MED ORDER — ORAL CARE MOUTH RINSE: 15.0000 mL | Freq: Once | OROMUCOSAL | Status: AC

## 2023-10-22 MED ORDER — FENTANYL CITRATE (PF) 100 MCG/2ML IJ SOLN
INTRAMUSCULAR | Status: AC
  Filled 2023-10-22: qty 2

## 2023-10-22 MED ORDER — ACETAMINOPHEN 325 MG PO TABS: 325.0000 mg | ORAL_TABLET | Freq: Four times a day (QID) | ORAL | Status: DC | PRN

## 2023-10-22 MED ORDER — PHENYLEPHRINE 80 MCG/ML (10ML) SYRINGE FOR IV PUSH (FOR BLOOD PRESSURE SUPPORT)
PREFILLED_SYRINGE | INTRAVENOUS | Status: DC | PRN
  Administered 2023-10-22 (×2): 240 ug via INTRAVENOUS
  Administered 2023-10-22: 160 ug via INTRAVENOUS
  Administered 2023-10-22 (×2): 240 ug via INTRAVENOUS
  Administered 2023-10-22 (×2): 160 ug via INTRAVENOUS
  Administered 2023-10-22: 240 ug via INTRAVENOUS

## 2023-10-22 MED ORDER — TAMSULOSIN HCL 0.4 MG PO CAPS
0.4000 mg | ORAL_CAPSULE | Freq: Every day | ORAL | Status: DC
  Administered 2023-10-22: 0.4 mg via ORAL
  Filled 2023-10-22: qty 1

## 2023-10-22 MED ORDER — CEFAZOLIN IN SODIUM CHLORIDE 3-0.9 GM/100ML-% IV SOLN
3.0000 g | INTRAVENOUS | Status: AC
  Administered 2023-10-22: 3 g via INTRAVENOUS

## 2023-10-22 MED ORDER — ONDANSETRON HCL 4 MG/2ML IJ SOLN: 4.0000 mg | Freq: Once | INTRAMUSCULAR | Status: DC | PRN

## 2023-10-22 MED ORDER — BUPIVACAINE LIPOSOME 1.3 % IJ SUSP
INTRAMUSCULAR | Status: DC | PRN
  Administered 2023-10-22: 10 mL via PERINEURAL

## 2023-10-22 MED ORDER — CEFAZOLIN SODIUM-DEXTROSE 2-4 GM/100ML-% IV SOLN
2.0000 g | Freq: Three times a day (TID) | INTRAVENOUS | Status: AC
Start: 2023-10-22 — End: 2023-10-23
  Administered 2023-10-22 – 2023-10-23 (×3): 2 g via INTRAVENOUS
  Filled 2023-10-22 (×3): qty 100

## 2023-10-22 MED ORDER — FENTANYL CITRATE (PF) 100 MCG/2ML IJ SOLN
25.0000 ug | INTRAMUSCULAR | Status: DC | PRN
  Administered 2023-10-22 (×2): 50 ug via INTRAVENOUS

## 2023-10-22 MED ORDER — ISOSORBIDE MONONITRATE ER 60 MG PO TB24
60.0000 mg | ORAL_TABLET | Freq: Every day | ORAL | Status: DC
  Administered 2023-10-23: 60 mg via ORAL
  Filled 2023-10-22: qty 1

## 2023-10-22 MED ORDER — FINASTERIDE 5 MG PO TABS
5.0000 mg | ORAL_TABLET | Freq: Every day | ORAL | Status: DC
  Administered 2023-10-23: 5 mg via ORAL
  Filled 2023-10-22: qty 1

## 2023-10-22 MED ORDER — SODIUM CHLORIDE 0.9% FLUSH: 10.0000 mL | Freq: Two times a day (BID) | INTRAVENOUS | Status: DC

## 2023-10-22 MED ORDER — GLYCOPYRROLATE PF 0.2 MG/ML IJ SOSY
PREFILLED_SYRINGE | INTRAMUSCULAR | Status: AC
  Filled 2023-10-22: qty 1

## 2023-10-22 MED ORDER — METOCLOPRAMIDE HCL 5 MG/ML IJ SOLN
5.0000 mg | Freq: Three times a day (TID) | INTRAMUSCULAR | Status: DC | PRN
Start: 2023-10-22 — End: 2023-10-23

## 2023-10-22 MED ORDER — ASPIRIN 81 MG PO TBEC
81.0000 mg | DELAYED_RELEASE_TABLET | Freq: Every day | ORAL | Status: DC
  Administered 2023-10-22 – 2023-10-23 (×2): 81 mg via ORAL
  Filled 2023-10-22 (×2): qty 1

## 2023-10-22 SURGICAL SUPPLY — 76 items
AID PSTN UNV HD RSTRNT DISP (MISCELLANEOUS) ×1
ALCOHOL 70% 16 OZ (MISCELLANEOUS) ×1 IMPLANT
APL PRP STRL LF DISP 70% ISPRP (MISCELLANEOUS) ×1
BAG COUNTER SPONGE SURGICOUNT (BAG) ×1 IMPLANT
BAG SPNG CNTER NS LX DISP (BAG) ×1
BASEPLATE AUG MED W-TAPER (Plate) IMPLANT
BEARING HUMERAL +3 40D (Joint) IMPLANT
BIT DRILL 2.7 W/STOP DISP (BIT) IMPLANT
BIT DRILL QUICK REL 1/8 2PK SL (BIT) IMPLANT
BIT DRILL TWIST 2.7 (BIT) IMPLANT
BLADE SAW SGTL 13X75X1.27 (BLADE) ×1 IMPLANT
BRNG HUM +3 40 RVRS SHLDR (Joint) ×1 IMPLANT
BSPLAT GLND MED AUG TPR ADPR (Plate) ×1 IMPLANT
CHLORAPREP W/TINT 26 (MISCELLANEOUS) ×1 IMPLANT
COOLER ICEMAN CLASSIC (MISCELLANEOUS) ×1 IMPLANT
COVER SURGICAL LIGHT HANDLE (MISCELLANEOUS) ×1 IMPLANT
DRAPE INCISE IOBAN 66X45 STRL (DRAPES) ×1 IMPLANT
DRAPE U-SHAPE 47X51 STRL (DRAPES) ×2 IMPLANT
DRSG AQUACEL AG ADV 3.5X10 (GAUZE/BANDAGES/DRESSINGS) ×1 IMPLANT
ELECT BLADE 4.0 EZ CLEAN MEGAD (MISCELLANEOUS) ×1
ELECT REM PT RETURN 9FT ADLT (ELECTROSURGICAL) ×1
ELECTRODE BLDE 4.0 EZ CLN MEGD (MISCELLANEOUS) ×1 IMPLANT
ELECTRODE REM PT RTRN 9FT ADLT (ELECTROSURGICAL) ×1 IMPLANT
GAUZE SPONGE 4X4 12PLY STRL LF (GAUZE/BANDAGES/DRESSINGS) ×1 IMPLANT
GLENOSPHERE VERSADIAL 40 +6 (Joint) IMPLANT
GLOVE BIOGEL PI IND STRL 6.5 (GLOVE) ×1 IMPLANT
GLOVE BIOGEL PI IND STRL 8 (GLOVE) ×1 IMPLANT
GLOVE ECLIPSE 6.5 STRL STRAW (GLOVE) ×1 IMPLANT
GLOVE ECLIPSE 8.0 STRL XLNG CF (GLOVE) ×1 IMPLANT
GOWN STRL REUS W/ TWL LRG LVL3 (GOWN DISPOSABLE) ×1 IMPLANT
GOWN STRL REUS W/ TWL XL LVL3 (GOWN DISPOSABLE) ×1 IMPLANT
GOWN STRL REUS W/TWL LRG LVL3 (GOWN DISPOSABLE) ×1
GOWN STRL REUS W/TWL XL LVL3 (GOWN DISPOSABLE) ×1
GUIDE BONE RSA SHLD ROT RT (ORTHOPEDIC DISPOSABLE SUPPLIES) IMPLANT
HANDLE YANKAUER SUCT OPEN TIP (MISCELLANEOUS) IMPLANT
HYDROGEN PEROXIDE 16OZ (MISCELLANEOUS) ×1 IMPLANT
JET LAVAGE IRRISEPT WOUND (IRRIGATION / IRRIGATOR) ×1
KIT BASIN OR (CUSTOM PROCEDURE TRAY) ×1 IMPLANT
KIT TURNOVER KIT B (KITS) ×1 IMPLANT
LAVAGE JET IRRISEPT WOUND (IRRIGATION / IRRIGATOR) ×1 IMPLANT
MANIFOLD NEPTUNE II (INSTRUMENTS) ×1 IMPLANT
NDL SUT 6 .5 CRC .975X.05 MAYO (NEEDLE) IMPLANT
NEEDLE MAYO TAPER (NEEDLE) ×1
NS IRRIG 1000ML POUR BTL (IV SOLUTION) ×1 IMPLANT
PACK SHOULDER (CUSTOM PROCEDURE TRAY) ×1 IMPLANT
PAD COLD SHLDR WRAP-ON (PAD) ×1 IMPLANT
PIN THREADED REVERSE (PIN) IMPLANT
RESTRAINT HEAD UNIVERSAL NS (MISCELLANEOUS) ×1 IMPLANT
RETRIEVER SUT HEWSON (MISCELLANEOUS) ×1 IMPLANT
SCREW BONE LOCKING 4.75X30X3.5 (Screw) IMPLANT
SCREW BONE STRL 6.5MMX25MM (Screw) IMPLANT
SCREW LOCKING 4.75MMX15MM (Screw) IMPLANT
SCREW LOCKING NS 4.75MMX20MM (Screw) IMPLANT
SLING ARM IMMOBILIZER LRG (SOFTGOODS) ×1 IMPLANT
SLING ARM IMMOBILIZER XL (CAST SUPPLIES) IMPLANT
SOL PREP POV-IOD 4OZ 10% (MISCELLANEOUS) ×1 IMPLANT
SPONGE T-LAP 18X18 ~~LOC~~+RFID (SPONGE) ×1 IMPLANT
STEM MINI LONG 15MM 83MM (Stem) IMPLANT
STRIP CLOSURE SKIN 1/2X4 (GAUZE/BANDAGES/DRESSINGS) ×1 IMPLANT
SUCTION TUBE FRAZIER 10FR DISP (SUCTIONS) ×1 IMPLANT
SUT BROADBAND TAPE 2PK 1.5 (SUTURE) IMPLANT
SUT MNCRL AB 3-0 PS2 18 (SUTURE) ×1 IMPLANT
SUT MON AB 3-0 SH 27 (SUTURE) ×1
SUT MON AB 3-0 SH27 (SUTURE) IMPLANT
SUT SILK 2 0 TIES 10X30 (SUTURE) ×1 IMPLANT
SUT VIC AB 0 CT1 27 (SUTURE) ×3
SUT VIC AB 0 CT1 27XBRD ANBCTR (SUTURE) ×4 IMPLANT
SUT VIC AB 1 CT1 27 (SUTURE) ×4
SUT VIC AB 1 CT1 27XBRD ANBCTR (SUTURE) ×2 IMPLANT
SUT VIC AB 2-0 CT1 27 (SUTURE)
SUT VIC AB 2-0 CT1 TAPERPNT 27 (SUTURE) ×3 IMPLANT
SUT VIC AB 2-0 CT2 27 (SUTURE) IMPLANT
SUT VICRYL 0 UR6 27IN ABS (SUTURE) ×2 IMPLANT
TOWEL GREEN STERILE (TOWEL DISPOSABLE) ×1 IMPLANT
TRAY HUM REV SHOULDER STD +6 (Shoulder) IMPLANT
WATER STERILE IRR 1000ML POUR (IV SOLUTION) ×1 IMPLANT

## 2023-10-22 NOTE — Anesthesia Procedure Notes (Signed)
Procedure Name: Intubation Date/Time: 10/22/2023 7:40 AM  Performed by: Georgianne Fick D, CRNAPre-anesthesia Checklist: Patient identified, Emergency Drugs available, Suction available and Patient being monitored Patient Re-evaluated:Patient Re-evaluated prior to induction Oxygen Delivery Method: Circle System Utilized Preoxygenation: Pre-oxygenation with 100% oxygen Induction Type: IV induction Ventilation: Mask ventilation without difficulty Laryngoscope Size: Mac and 4 Grade View: Grade II Tube type: Oral Tube size: 7.5 mm Number of attempts: 1 Airway Equipment and Method: Stylet and Oral airway Placement Confirmation: ETT inserted through vocal cords under direct vision, positive ETCO2 and breath sounds checked- equal and bilateral Secured at: 23 cm Tube secured with: Tape Dental Injury: Teeth and Oropharynx as per pre-operative assessment

## 2023-10-22 NOTE — Brief Op Note (Signed)
   10/22/2023  10:51 AM  PATIENT:  Mayra Neer Limbaugh  77 y.o. male  PRE-OPERATIVE DIAGNOSIS:  right shoulder osteoarthritis, biceps tendinitis  POST-OPERATIVE DIAGNOSIS:  right shoulder osteoarthritis, biceps tendinitis  PROCEDURE:  Procedure(s): RIGHT REVERSE SHOULDER ARTHROPLASTY BICEPS TENODESIS  SURGEON:  Surgeon(s): August Saucer, Corrie Mckusick, MD  ASSISTANT: magnant pa  ANESTHESIA:   general  EBL: 75 ml    Total I/O In: 650 [I.V.:650] Out: -   BLOOD ADMINISTERED: none  DRAINS: none   LOCAL MEDICATIONS USED:  vanco  SPECIMEN:  No Specimen  COUNTS:  YES  TOURNIQUET:  * No tourniquets in log *  DICTATION: .Other Dictation: Dictation Number [id number]  PLAN OF CARE: Admit for overnight observation  PATIENT DISPOSITION:  PACU - hemodynamically stable

## 2023-10-22 NOTE — Anesthesia Procedure Notes (Signed)
Anesthesia Regional Block: Interscalene brachial plexus block   Pre-Anesthetic Checklist: , timeout performed,  Correct Patient, Correct Site, Correct Laterality,  Correct Procedure, Correct Position, site marked,  Risks and benefits discussed,  Surgical consent,  Pre-op evaluation,  At surgeon's request and post-op pain management  Laterality: Left  Prep: chloraprep       Needles:  Injection technique: Single-shot  Needle Type: Echogenic Stimulator Needle     Needle Length: 5cm  Needle Gauge: 22     Additional Needles:   Procedures:, nerve stimulator,,, ultrasound used (permanent image in chart),,     Nerve Stimulator or Paresthesia:  Response: hand, 0.45 mA  Additional Responses:   Narrative:  Start time: 10/22/2023 7:15 AM End time: 10/22/2023 7:20 AM Injection made incrementally with aspirations every 5 mL.  Performed by: Personally  Anesthesiologist: Bethena Midget, MD  Additional Notes: Functioning IV was confirmed and monitors were applied.  A 50mm 22ga Arrow echogenic stimulator needle was used. Sterile prep and drape,hand hygiene and sterile gloves were used. Ultrasound guidance: relevant anatomy identified, needle position confirmed, local anesthetic spread visualized around nerve(s)., vascular puncture avoided.  Image printed for medical record. Negative aspiration and negative test dose prior to incremental administration of local anesthetic. The patient tolerated the procedure well.   No pik

## 2023-10-22 NOTE — Op Note (Signed)
NAME: Brad Woods, Brad Woods MEDICAL RECORD NO: [id number] ACCOUNT NO: [id number] DATE OF BIRTH: 09-Apr-1946 FACILITY: [facility] LOCATION: [facility] PHYSICIAN: Graylin Shiver. August Saucer, MD  Operative Report   DATE OF PROCEDURE: 10/22/2023  PREOPERATIVE DIAGNOSIS:  Right shoulder arthritis and biceps tendinitis.  POSTOPERATIVE DIAGNOSIS:  Right shoulder arthritis and biceps tendinitis.  PROCEDURE:  Right shoulder biceps tenodesis and reverse shoulder replacement using Biomet comprehensive reverse shoulder system medium augmented baseplate with 1 central compression screw and four peripheral locking screws with 40 mm +6 glenosphere and  mini humeral tray +6 taper offset with +3 thickness bearing and size 15 mini humeral stem.  SURGEON:  Graylin Shiver. August Saucer, MD  ASSISTANT:  Karenann Cai, PA.  INDICATIONS:  This is a 77 year old patient with right shoulder pain and end-stage arthritis, who presents for operative management after explanation of risks and benefits.  DESCRIPTION OF PROCEDURE:  The patient was brought to the operating room where general anesthetic was induced.  Preoperative antibiotics administered.  Timeout was called.  The patient's right shoulder, arm and hand prescrubbed with hydrogen peroxide  followed by alcohol and Betadine, which was allowed to air dry, then prepped with ChloraPrep solution and draped in sterile manner.  Collier Flowers was used to cover and seal the operative field.  The patient's preoperative range of motion passively as described  in the history and physical.  Essentially, 40/80/110.  After calling timeout, the deltopectoral approach was made.  IrriSept solution utilized, Cephalic vein mobilized laterally.  Interval between the deltoid and pectoralis was developed.  Anterior  attachment of the deltoid then elevated manually to decrease tension on the deltoid fibers anteriorly.  Upper 1.5 cm of the pec was released.  Biceps tendon was tenodesed to the pec tendon and surrounding soft tissue  using 5-0 Vicryl sutures.  Biceps  tendon itself was flattened and significantly enlarged to about 4-5 times the normal cross-sectional volume.  It had also essentially worn away the anterior attachment of the subscapularis.  Subscapularis however, remained intact.  Remainder of the  rotator cuff also appeared intact, but with some tendinosis.  Circumflex vessels were ligated.  Axillary nerve palpated and protected at all times during the case.  The subscap was then detached using a 15 blade from the lesser tuberosity and down about  2 cm on the humeral neck.  This dissection was carried around to the 5 o'clock position on the humerus with progressive external rotation.  This was done with a Cobb elevator. Humeral head was dislocated.  Severe arthritis was present.  The head was  dislocated and a Browne retractor placed along with reverse retractors.  The superior aspect of the humeral head was then debrided and proximal reaming was performed up to size 15.  Head was then cut in 30 degrees of retroversion just medial to the  lesser tuberosity.  Broaching then performed up to size 15, which gave a very nice fit.  Cap placed and then the posterior retractor was placed.  Anterior retractor also placed.  With the patient under muscle relaxation labrum was circumferentially  excised from the glenoid.  Care was taken to avoid injury to the axillary nerve at this time. Patient-specific guide was then placed and 2 guide pins were placed.  Reaming then performed in accordance with preoperative templating. Posterior reaming then  performed as well.  Had a very nice medium augment baseplate trial fit very nicely onto the glenoid.  The true baseplate was then placed after irrigating with IrriSept.  One central compression screw  with good fixation achieved was placed and then 4  peripheral locking screws were placed.  Next, trial reduction was performed with multiple combinations.  The most stable combination was the 40  mm glenosphere with +6 offset along with a 40 mm +6 offset tray with +3 liner.  This gave very nice stability  to extension, adduction and forward force along with internal and external rotation, 90 degrees of abduction.  The construct was "2 fingers tight."  Trial components were removed.  Thorough irrigation performed with IrriSept solution, 40 glenosphere was  placed onto the dried Morse taper.  The stem was then placed after irrigating the canal with IrriSept and then placing vancomycin powder after sucking the IrriSept out. We did place 6 suture tapes prior to placement of the implant on the humeral side.   Excellent stability parameters were maintained and the true humeral tray and spacer were then placed.  At this time, pouring irrigation was utilized x3 liters.  The subscap was then repaired to the lesser tuberosity using the SutureTapes and Nice knots.   This was done with the arm in 30 degrees of external rotation.  The rotator interval was then irrigated with IrriSept solution.  Vancomycin powder placed on the implant.  Rotator interval was then closed using #1 Vicryl suture with the arm in 30 degrees  of external rotation.  Axillary nerve again palpated and found to be intact by palpation.  Next, the deltopectoral interval was then closed with preservation of the cephalic vein using #1 Vicryl suture.  Skin closed using interrupted inverted 0 Vicryl  suture, 2-0 Vicryl suture, and 3-0 Monocryl with Steri-Strips and Aquacel dressing applied.  The patient tolerated the procedure well without immediate complications.  Shoulder immobilizer placed.  Luke's assistance was required at all times for  retraction, opening, closing, mobilization of tissue.  His assistance was a medical necessity.   PUS D: 10/22/2023 10:58:03 am T: 10/22/2023 11:15:00 am  JOB: [id number]/ [id number]

## 2023-10-22 NOTE — Anesthesia Postprocedure Evaluation (Signed)
Anesthesia Post Note  Patient: Brad Woods surgeon) Performed: RIGHT REVERSE SHOULDER ARTHROPLASTY (Right: Shoulder) BICEPS TENODESIS (Right: Shoulder)     Patient location during evaluation: PACU Anesthesia Type: General Level of consciousness: awake and alert Pain management: pain level controlled Vital Signs Assessment: post-procedure vital signs reviewed and stable Respiratory status: spontaneous breathing, nonlabored ventilation, respiratory function stable and patient connected to nasal cannula oxygen Cardiovascular status: blood pressure returned to baseline and stable Postop Assessment: no apparent nausea or vomiting Anesthetic complications: no  No notable events documented.  Last Vitals:  Vitals:   10/22/23 1130 10/22/23 1158  BP: (!) 96/57 104/72  Pulse: 81 88  Resp: 10 18  Temp: (!) 36.2 C 36.7 C  SpO2: 97% 93%    Last Pain:  Vitals:   10/22/23 1102  TempSrc:   PainSc: 8                  Kennieth Rad

## 2023-10-22 NOTE — Transfer of Care (Signed)
Immediate Anesthesia Transfer of Care Note  Patient: Brad Woods  Procedure(s) Performed: RIGHT REVERSE SHOULDER ARTHROPLASTY (Right: Shoulder) BICEPS TENODESIS (Right: Shoulder)  Patient Location: PACU  Anesthesia Type:General  Level of Consciousness: awake, alert , and oriented  Airway & Oxygen Therapy: Patient Spontanous Breathing and Patient connected to nasal cannula oxygen  Post-op Assessment: Report given to RN and Post -op Vital signs reviewed and stable  Post vital signs: Reviewed and stable  Last Vitals:  Vitals Value Taken Time  BP 101/50 10/22/23 1041  Temp    Pulse 87 10/22/23 1043  Resp 16 10/22/23 1043  SpO2 91 % 10/22/23 1043  Vitals shown include unfiled device data.  Last Pain:  Vitals:   10/22/23 0640  TempSrc:   PainSc: 2          Complications: No notable events documented.

## 2023-10-22 NOTE — H&P (Signed)
Brad Woods is an 77 y.o. male.   Chief Complaint: right shoulder pain HPI: Brad Woods is a 77 y.o. male who presents to the office reporting right shoulder pain.  Since he was last seen has had a thin cut CT scan of his right shoulder which does show severe arthritis with B2 glenoid and some posterior erosion.  Patient denies any history of injury and there is no prior surgery to the right shoulder.  Does have a history of injection which gave him some short-lived relief.  The pain does wake him from sleep on some nights.  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  This is part-time.  He is really getting tired of dealing with the pain.  Does take medication for the problem but with limited relief.  Denies much in the way of radicular symptoms..   Past Medical History:  Diagnosis Date   Allergy    Arthritis    Asthma    when younger   Coronary artery disease    s/p stenting 10/2016   Dyspnea    GERD (gastroesophageal reflux disease)    Headache    Hyperlipidemia    Hypertension    Kidney stones    Myocardial infarction (HCC)    Pneumonia    hx of   Sleep apnea    cpap    Past Surgical History:  Procedure Laterality Date   CARDIAC CATHETERIZATION N/A 05/20/2016   Procedure: Left Heart Cath and Coronary Angiography;  Surgeon: Runell Gess, MD;  Location: [facility] INVASIVE CV LAB;  Service: Cardiovascular;  Laterality: N/A;   CARDIAC CATHETERIZATION N/A 11/22/2016   Procedure: Left Heart Cath and Coronary Angiography;  Surgeon: Yvonne Kendall, MD;  Location: [facility] INVASIVE CV LAB;  Service: Cardiovascular;  Laterality: N/A;   CARPAL TUNNEL RELEASE Right    COLONOSCOPY     EYE SURGERY Left    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 He has never used smokeless tobacco. He reports current alcohol use. He reports that he does not use drugs.  Allergies:  Allergies  Allergen Reactions   Hydromorphone Hcl Other (See Comments)    "flatlined" - Patient states he was overdosed. He states he isn't allergic.   Hydromorphone     Cardiac arrest   Aleve [Naproxen Sodium] Dermatitis    States he was given aleve in the hospital and it caused welts.     Medications Prior to Admission  Medication Sig Dispense Refill   aspirin EC 81 MG EC tablet Take 1 tablet (81 mg total) by mouth daily.     carvedilol (COREG) 12.5 MG tablet Take 1 tablet (12.5 mg total) by mouth 2 (two) times daily. 180 tablet 3   clopidogrel (PLAVIX) 75 MG tablet Take 1 tablet (75 mg total) by mouth daily. 90 tablet 3   finasteride (PROSCAR) 5 MG tablet Take 5 mg  by mouth daily.     fluticasone (FLONASE) 50 MCG/ACT nasal spray Place 2 sprays into the nose daily as needed for allergies.      isosorbide mononitrate (IMDUR) 60 MG 24 hr tablet Take 1 tablet (60 mg total) by mouth daily. 90 tablet 3   Multiple Vitamins-Minerals (CENTRUM SILVER 50+MEN PO) Take 1 tablet by mouth daily.     nitroGLYCERIN (NITROSTAT) 0.4 MG SL tablet Dissolve 1 tablet under the tongue every 5 minutes as needed for chest pain. Max of 3 doses, then 911. 25 tablet 3   REPATHA SURECLICK 140 MG/ML SOAJ INJECT ONE pen into THE SKIN every 14 DAYS 6 mL 3   tamsulosin (FLOMAX) 0.4 MG CAPS capsule Take 0.4 mg by mouth at bedtime.      albuterol (VENTOLIN HFA) 108 (90 Base) MCG/ACT inhaler Inhale 2 puffs into the lungs every 6 (six) hours as needed for wheezing or shortness of breath. 1 each 6    Fluticasone-Umeclidin-Vilant (TRELEGY ELLIPTA) 200-62.5-25 MCG/ACT AEPB Inhale 1 puff into the lungs daily. 60 each 11   Semaglutide, 1 MG/DOSE, (OZEMPIC, 1 MG/DOSE,) 2 MG/1.5ML SOPN Inject 2 mg into the skin once a week.      No results found for this or any previous visit (from the past 48 hour(s)). No results found.  Review of Systems  Musculoskeletal:  Positive for arthralgias.  All other systems reviewed and are negative.   Blood pressure (!) 145/89, pulse 82, temperature 98.7 F (37.1 C), temperature source Oral, resp. rate 18, height 6\' 1"  (1.854 m), weight 124.7 kg, SpO2 95%. Physical Exam Vitals reviewed.  HENT:     Head: Normocephalic.     Nose: Nose normal.     Mouth/Throat:     Mouth: Mucous membranes are moist.  Eyes:     Pupils: Pupils are equal, round, and reactive to light.  Cardiovascular:     Rate and Rhythm: Normal rate.     Pulses: Normal pulses.  Pulmonary:     Effort: Pulmonary effort is normal.  Abdominal:     General: Abdomen is flat.  Musculoskeletal:     Cervical back: Normal range of motion.  Skin:    General: Skin is warm.     Capillary Refill: Capillary refill takes less than 2 seconds.  Neurological:     General: No focal deficit present.     Mental Status: He is alert.  Psychiatric:        Mood and Affect: Mood normal.    Ortho exam demonstrates range of motion on the right to 40/60/90. Range of motion on the left 60/90/140. He has 4+ out of 5 weakness to external rotation on the right compared to the left. Subscap strength 5+ out of 5 bilaterally. Some bicipital groove tenderness is present. Deltoid is functional. Crepitus is present with passive range of motion of that shoulder . deltoid functional  Assessment/Plan Impression is right shoulder arthritis with pain as well as some loss of strength in external rotation as well as diminished range of motion. Patient has pain with most of his ADLs. The risk and benefits of reverse shoulder  replacement and biceps tenodesis are discussed with the patient including not limited to infection nerve vessel damage incomplete pain relief incomplete restoration of function as well as instability. With the use of diagrams and models the rationale of reverse shoulder replacement is discussed. The benefits of patient specific instrumentation are also demonstrated. Patient does have a history of cardiac issues and takes Plavix.  He would need cardiac risk stratification prior to surgery. Patient understands risk and benefits and wishes to proceed.    Burnard Bunting, MD 10/22/2023, 6:31 AM

## 2023-10-23 ENCOUNTER — Encounter (HOSPITAL_COMMUNITY): Payer: Self-pay | Admitting: Orthopedic Surgery

## 2023-10-23 DIAGNOSIS — M19011 Primary osteoarthritis, right shoulder: Secondary | ICD-10-CM | POA: Diagnosis not present

## 2023-10-23 MED ORDER — METHOCARBAMOL 500 MG PO TABS: 500.0000 mg | ORAL_TABLET | Freq: Three times a day (TID) | ORAL | 0 refills | Status: DC | PRN

## 2023-10-23 MED ORDER — OXYCODONE HCL 5 MG PO TABS: 5.0000 mg | ORAL_TABLET | ORAL | 0 refills | Status: DC | PRN

## 2023-10-23 MED ORDER — DOCUSATE SODIUM 100 MG PO CAPS: 100.0000 mg | ORAL_CAPSULE | Freq: Two times a day (BID) | ORAL | 0 refills | Status: DC

## 2023-10-23 MED ORDER — ACETAMINOPHEN 325 MG PO TABS: 650.0000 mg | ORAL_TABLET | Freq: Four times a day (QID) | ORAL | 0 refills | Status: DC | PRN

## 2023-10-23 NOTE — Progress Notes (Signed)
  Subjective: Brad Woods is a 77 y.o. male s/p right RSA.  They are POD1.  Pt's pain is controlled.  Patient denies any complaints of chest pain, shortness of breath, abdominal pain. Block still somewhat in effect.  No difference from his baseline according to him aside from some "hallucination" which really sounds like double vision according to his description.  Has ambulated without difficulty.  Worked well with OT.    Objective: Vital signs in last 24 hours: Temp:  [97.6 F (36.4 C)-98.7 F (37.1 C)] 97.8 F (36.6 C) (10/30 0817) Pulse Rate:  [77-90] 84 (10/30 0817) Resp:  [16-19] 19 (10/30 0817) BP: (113-131)/(62-72) 127/72 (10/30 0817) SpO2:  [94 %] 94 % (10/30 0817)  Intake/Output from previous day: 10/29 0701 - 10/30 0700 In: 650 [I.V.:650] Out: -  Intake/Output this shift: No intake/output data recorded.  Exam:  No gross blood or drainage overlying the dressing 2+ radial pulse of the operative extremity Postoperative physical exam somewhat limited by interscalene block but intact EPL, FPL, finger abduction, grip strength.     Labs: No results for input(s): "HGB" in the last 72 hours. No results for input(s): "WBC", "RBC", "HCT", "PLT" in the last 72 hours. No results for input(s): "NA", "K", "CL", "CO2", "BUN", "CREATININE", "GLUCOSE", "CALCIUM" in the last 72 hours. No results for input(s): "LABPT", "INR" in the last 72 hours.  Assessment/Plan: Pt is POD1 s/p right RSA    -Plan to discharge to home today  -No lifting with the operative arm  -Stay in sling except for showering/sleeping and using CPM machine at home.  No lifting with the operative arm more than 1 to 2 pounds  -Follow-up with Dr. August Saucer in clinic 2 weeks postoperatively     [facility] [place] 10/23/2023, 12:02 PM

## 2023-10-23 NOTE — Evaluation (Signed)
Occupational Therapy Evaluation Patient Details Name: Brad Woods MRN: [id number] DOB: [birth year] Today's Date: 10/23/2023   History of Present Illness Pt is a 77 y/o male presenting on 10/29 for R reverse shoulder arthroplasty. PMH includes: asthma, CAD, HTN, MI, pneumonia, sleep apnea.   Clinical Impression   PTA patient independent and working part time.  Admitted for above and presents with problem list below.  He was educated on shoulder precautions, sling mgmt, exercises, ADL compensatory techniques, DME and safety. He requires setup to mod assist for ADLs, min guard fading to supervision for transfers and mobility in room.  He will have good support from spouse and daughter at dc, who can assist with ADLs as needed.  Pt completed exercises as below to hand/wrist/elbow- with AAROm as needed due to nerve block still intact.  Pt/family with no further questions or concerns. Will follow acutely to optimize independence and safety, but follow up per surgeons recommendation.       If plan is discharge home, recommend the following: A lot of help with bathing/dressing/bathroom;Assist for transportation;Assistance with cooking/housework    Functional Status Assessment  Patient has had a recent decline in their functional status and demonstrates the ability to make significant improvements in function in a reasonable and predictable amount of time.  Equipment Recommendations  Tub/shower seat (family plans to get on their own)    Recommendations for Other Services       Precautions / Restrictions Precautions Precautions: Fall;Shoulder Type of Shoulder Precautions: no shoulder ROM, OK for hand/wrist/elbow.  Pendulums okay. Shoulder Interventions: Shoulder sling/immobilizer;At all times;Off for dressing/bathing/exercises Precaution Booklet Issued: Yes (comment) Precaution Comments: reviewed with pt and family Required Braces or Orthoses: Sling Restrictions Weight Bearing  Restrictions: Yes RUE Weight Bearing: Non weight bearing      Mobility Bed Mobility Overal bed mobility: Modified Independent             General bed mobility comments: HOB elevated but no assist required    Transfers Overall transfer level: Needs assistance Equipment used: None Transfers: Sit to/from Stand Sit to Stand: Contact guard assist, Supervision                  Balance Overall balance assessment: Needs assistance Sitting-balance support: No upper extremity supported, Feet supported Sitting balance-Leahy Scale: Fair     Standing balance support: Single extremity supported, No upper extremity supported, During functional activity Standing balance-Leahy Scale: Fair                             ADL either performed or assessed with clinical judgement   ADL Overall ADL's : Needs assistance/impaired     Grooming: Supervision/safety;Wash/dry hands;Standing   Upper Body Bathing: Minimal assistance;Sitting   Lower Body Bathing: Moderate assistance;Sitting/lateral leans;Sit to/from stand   Upper Body Dressing : Minimal assistance;Sitting   Lower Body Dressing: Moderate assistance;Sit to/from stand Lower Body Dressing Details (indicate cue type and reason): requires assist to don socks but able to doff, assist to pull pants/underwear over R hip Toilet Transfer: Contact guard assist;Supervision/safety;Ambulation Toilet Transfer Details (indicate cue type and reason): min guard fading to supervision for safety Toileting- Clothing Manipulation and Hygiene: Minimal assistance;Sit to/from Nurse, children's Details (indicate cue type and reason): discussed L hand support during transfer, pt verbalizes understanding Functional mobility during ADLs: Contact guard assist;Supervision/safety       Vision Baseline Vision/History: 1 Wears glasses Vision Assessment?: No apparent  visual deficits     Perception         Praxis          Pertinent Vitals/Pain Pain Assessment Pain Assessment: Faces Faces Pain Scale: Hurts a little bit Pain Location: R shoulder Pain Descriptors / Indicators: Discomfort Pain Intervention(s): Limited activity within patient's tolerance, Monitored during session, Repositioned, Ice applied     Extremity/Trunk Assessment Upper Extremity Assessment Upper Extremity Assessment: RUE deficits/detail;Right hand dominant RUE Deficits / Details: s/p shoulder surgery in sling, WFL hand but limited supination/eblow extension due to nerve block RUE Sensation:  (nerve block intact) RUE Coordination: decreased fine motor;decreased gross motor   Lower Extremity Assessment Lower Extremity Assessment: Overall WFL for tasks assessed       Communication Communication Communication: No apparent difficulties   Cognition Arousal: Alert Behavior During Therapy: WFL for tasks assessed/performed Overall Cognitive Status: Within Functional Limits for tasks assessed                                       General Comments  daughter and spouse at side and supportive    Exercises Exercises: Shoulder, Other exercises Shoulder Exercises Elbow Flexion: AAROM, Right, 10 reps, Supine Elbow Extension: AROM, Right, 10 reps, Supine Wrist Flexion: AROM, Right, 10 reps, Supine Wrist Extension: AROM, Right, 10 reps, Supine Digit Composite Flexion: AROM, Right, 10 reps, Supine Composite Extension: AROM, Left, 10 reps, Supine Other Exercises Other Exercises: AROM R 10 reps pronation; AAROM R 10 reps supination   Shoulder Instructions Shoulder Instructions Donning/doffing shirt without moving shoulder: Minimal assistance Method for sponge bathing under operated UE: Maximal assistance Donning/doffing sling/immobilizer: Moderate assistance Correct positioning of sling/immobilizer: Minimal assistance Pendulum exercises (written home exercise program):  (provided HEP, not completed today) ROM for  elbow, wrist and digits of operated UE: Moderate assistance Sling wearing schedule (on at all times/off for ADL's): Supervision/safety Proper positioning of operated UE when showering: Supervision/safety Positioning of UE while sleeping: Supervision/safety    Home Living Family/patient expects to be discharged to:: Private residence Living Arrangements: Spouse/significant other;Children (daughter) Available Help at Discharge: Family;Available 24 hours/day Type of Home: House Home Access: Stairs to enter [employer] of Steps: 1 Entrance Stairs-Rails: None Home Layout: One level     Bathroom Shower/Tub: Producer, television/film/video: Handicapped height     Home Equipment: Agricultural consultant (2 wheels);Crutches          Prior Functioning/Environment Prior Level of Function : Independent/Modified Independent             Mobility Comments: no DME ADLs Comments: driving, 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        OT Problem List: Decreased activity tolerance;Decreased knowledge of use of DME or AE;Decreased knowledge of precautions;Pain;Impaired UE functional use      OT Treatment/Interventions: Self-care/ADL training;DME and/or AE instruction;Therapeutic activities;Balance training;Patient/family education    OT Goals(Current goals can be found in the care plan section) Acute Rehab OT Goals Patient Stated Goal: home OT Goal Formulation: With patient Time For Goal Achievement: 11/06/23 Potential to Achieve Goals: Good  OT Frequency: Min 1X/week    Co-evaluation              AM-PAC OT "6 Clicks" Daily Activity     Outcome Measure Help from another person eating meals?: None Help from another person taking care of personal grooming?: A Little Help from another person toileting, which  includes using toliet, bedpan, or urinal?: A Little Help from another person bathing (including washing, rinsing, drying)?: A Lot Help from another person  to put on and taking off regular upper body clothing?: A Little Help from another person to put on and taking off regular lower body clothing?: A Lot 6 Click Score: 17   End of Session Equipment Utilized During Treatment: Gait belt Nurse Communication: Mobility status  Activity Tolerance: Patient tolerated treatment well Patient left: with call bell/phone within reach;with family/visitor present;Other (comment) (sitting EOB)  OT Visit Diagnosis: Pain Pain - Right/Left: Right Pain - part of body: Shoulder                Time: 2130-8657 OT Time Calculation (min): 33 min Charges:  OT General Charges $OT Visit: 1 Visit OT Evaluation $OT Eval Moderate Complexity: 1 Mod OT Treatments $Self Care/Home Management : 8-22 mins  Barry Brunner, OT Acute Rehabilitation Services Office [phone]   Chancy Milroy 10/23/2023, 10:45 AM

## 2023-10-23 NOTE — Plan of Care (Signed)
  Problem: Pain Management: Goal: General experience of comfort will improve Outcome: Progressing   Problem: Safety: Goal: Ability to remain free from injury will improve Outcome: Progressing

## 2023-10-24 ENCOUNTER — Telehealth: Payer: Self-pay

## 2023-10-24 NOTE — Telephone Encounter (Signed)
Got it - thanks.

## 2023-10-24 NOTE — Telephone Encounter (Signed)
-----   Message from Lynn Ito sent at 10/23/2023  3:47 PM EDT ----- Regarding: CPM denial Received a denial letter for shoulder CPM. Medicare does not cover shoulder CPM's.

## 2023-10-24 NOTE — Telephone Encounter (Signed)
FYI

## 2023-10-27 NOTE — Discharge Summary (Signed)
Physician Discharge Summary      Patient ID: Brad Woods MRN: [id number] DOB/AGE: [id number] y.o.  Admit date: 10/22/2023 Discharge date: 10/23/2023  Admission Diagnoses:  Principal Problem:   OA (osteoarthritis) of shoulder Active Problems:   S/P reverse total shoulder arthroplasty, right   Arthritis of right shoulder region   Biceps tendonitis, right   Discharge Diagnoses:  Same  Surgeries: Procedure(s): RIGHT REVERSE SHOULDER ARTHROPLASTY BICEPS TENODESIS on 10/22/2023   Consultants:   Discharged Condition: Stable  Hospital Course: Bacilio Abascal Skousen is an 77 y.o. male who was admitted 10/22/2023 with a chief complaint of right shoulder pain, and found to have a diagnosis of right shoulder osteoarthritis.  They were brought to the operating room on 10/22/2023 and underwent the above named procedures.  Pt awoke from anesthesia without complication and was transferred to the floor. On POD1, pain was controlled.  No red flag signs or symptoms.  He is ambulatory around the unit.  Worked well with occupational therapy.  Discharged home on POD 1..  Pt will f/u with Dr. August Saucer in clinic in ~2 weeks.   Antibiotics given:  Anti-infectives (From admission, onward)    Start     Dose/Rate Route Frequency Ordered Stop   10/22/23 1600  ceFAZolin (ANCEF) IVPB 2g/100 mL premix        2 g 200 mL/hr over 30 Minutes Intravenous Every 8 hours 10/22/23 1200 10/23/23 0919   10/22/23 0829  vancomycin (VANCOCIN) powder  Status:  Discontinued          As needed 10/22/23 0830 10/22/23 1123   10/22/23 0608  ceFAZolin (ANCEF) 3-0.9 GM/100ML-% IVPB       Note to Pharmacy: Kathrene Bongo D: cabinet override      10/22/23 0608 10/22/23 0750   10/22/23 0600  ceFAZolin (ANCEF) IVPB 3g/100 mL premix        3 g 200 mL/hr over 30 Minutes Intravenous On call to O.R. 10/22/23 4098 10/22/23 0750     .  Recent vital signs:  Vitals:   10/23/23 0516 10/23/23 0817  BP: 131/67 127/72  Pulse: 86  84  Resp:  19  Temp: 98.7 F (37.1 C) 97.8 F (36.6 C)  SpO2:  94%    Recent laboratory studies:  Results for orders placed or performed during the hospital encounter of 10/08/23  Surgical pcr screen   Specimen: Nasal Mucosa; Nasal Swab  Result Value Ref Range   MRSA, PCR NEGATIVE NEGATIVE   Staphylococcus aureus NEGATIVE NEGATIVE  CBC  Result Value Ref Range   WBC 7.0 4.0 - 10.5 K/uL   RBC 5.12 4.22 - 5.81 MIL/uL   Hemoglobin 15.7 13.0 - 17.0 g/dL   HCT 11.9 14.7 - 82.9 %   MCV 90.2 80.0 - 100.0 fL   MCH 30.7 26.0 - 34.0 pg   MCHC 34.0 30.0 - 36.0 g/dL   RDW 56.2 13.0 - 86.5 %   Platelets 167 150 - 400 K/uL   nRBC 0.0 0.0 - 0.2 %  Basic metabolic panel  Result Value Ref Range   Sodium 137 135 - 145 mmol/L   Potassium 4.2 3.5 - 5.1 mmol/L   Chloride 103 98 - 111 mmol/L   CO2 25 22 - 32 mmol/L   Glucose, Bld 104 (H) 70 - 99 mg/dL   BUN 11 8 - 23 mg/dL   Creatinine, Ser 7.84 0.61 - 1.24 mg/dL   Calcium 9.7 8.9 - 69.6 mg/dL   GFR, Estimated >29 >52 mL/min  Anion gap 9 5 - 15  Urinalysis, w/ Reflex to Culture (Infection Suspected) -Urine, Clean Catch  Result Value Ref Range   Specimen Source URINE, CLEAN CATCH    Color, Urine YELLOW YELLOW   APPearance HAZY (A) CLEAR   Specific Gravity, Urine 1.016 1.005 - 1.030   pH 5.0 5.0 - 8.0   Glucose, UA NEGATIVE NEGATIVE mg/dL   Hgb urine dipstick NEGATIVE NEGATIVE   Bilirubin Urine NEGATIVE NEGATIVE   Ketones, ur NEGATIVE NEGATIVE mg/dL   Protein, ur NEGATIVE NEGATIVE mg/dL   Nitrite NEGATIVE NEGATIVE   Leukocytes,Ua NEGATIVE NEGATIVE   RBC / HPF 0-5 0 - 5 RBC/hpf   WBC, UA 0-5 0 - 5 WBC/hpf   Bacteria, UA NONE SEEN NONE SEEN   Squamous Epithelial / HPF 0-5 0 - 5 /HPF   Mucus PRESENT    Hyaline Casts, UA PRESENT     Discharge Medications:   Allergies as of 10/23/2023       Reactions   Hydromorphone Hcl Other (See Comments)   "flatlined" - Patient states he was overdosed. He states he isn't allergic.    Hydromorphone    Cardiac arrest   Aleve [naproxen Sodium] Dermatitis   States he was given aleve in the hospital and it caused welts.         Medication List     TAKE these medications    acetaminophen 325 MG tablet Commonly known as: TYLENOL Take 2 tablets (650 mg total) by mouth every 6 (six) hours as needed for mild pain (pain score 1-3) (pain score 1-3 or temp > 100.5).   albuterol 108 (90 Base) MCG/ACT inhaler Commonly known as: VENTOLIN HFA Inhale 2 puffs into the lungs every 6 (six) hours as needed for wheezing or shortness of breath.   aspirin EC 81 MG tablet Take 1 tablet (81 mg total) by mouth daily.   carvedilol 12.5 MG tablet Commonly known as: COREG Take 1 tablet (12.5 mg total) by mouth 2 (two) times daily.   CENTRUM SILVER 50+MEN PO Take 1 tablet by mouth daily.   clopidogrel 75 MG tablet Commonly known as: PLAVIX Take 1 tablet (75 mg total) by mouth daily.   docusate sodium 100 MG capsule Commonly known as: COLACE Take 1 capsule (100 mg total) by mouth 2 (two) times daily.   finasteride 5 MG tablet Commonly known as: PROSCAR Take 5 mg by mouth daily.   fluticasone 50 MCG/ACT nasal spray Commonly known as: FLONASE Place 2 sprays into the nose daily as needed for allergies.   isosorbide mononitrate 60 MG 24 hr tablet Commonly known as: IMDUR Take 1 tablet (60 mg total) by mouth daily.   methocarbamol 500 MG tablet Commonly known as: ROBAXIN Take 1 tablet (500 mg total) by mouth every 8 (eight) hours as needed for muscle spasms.   nitroGLYCERIN 0.4 MG SL tablet Commonly known as: NITROSTAT Dissolve 1 tablet under the tongue every 5 minutes as needed for chest pain. Max of 3 doses, then 911.   oxyCODONE 5 MG immediate release tablet Commonly known as: Oxy IR/ROXICODONE Take 1 tablet (5 mg total) by mouth every 4 (four) hours as needed for moderate pain (pain score 4-6) (pain score 4-6).   Ozempic (1 MG/DOSE) 2 MG/1.5ML Sopn Generic drug:  Semaglutide (1 MG/DOSE) Inject 2 mg into the skin once a week.   Repatha SureClick 140 MG/ML Soaj Generic drug: Evolocumab INJECT ONE pen into THE SKIN every 14 DAYS   tamsulosin 0.4 MG Caps  capsule Commonly known as: FLOMAX Take 0.4 mg by mouth at bedtime.   Trelegy Ellipta 200-62.5-25 MCG/ACT Aepb Generic drug: Fluticasone-Umeclidin-Vilant Inhale 1 puff into the lungs daily.        Diagnostic Studies: DG Chest 2 View  Result Date: 10/22/2023 CLINICAL DATA:  Dyspnea on exertion.  Asbestos exposure EXAM: CHEST - 2 VIEW COMPARISON:  Radiograph 06/22/2020.  CT 05/02/2006 FINDINGS: The cardiomediastinal contours are normal. No pleural plaques demonstrated by radiograph. Pulmonary vasculature is normal. No consolidation, pleural effusion, or pneumothorax. Remote left rib fracture. Thoracic spondylosis. No acute osseous abnormalities are seen. IMPRESSION: No acute chest findings. No pleural plaques demonstrated by radiograph. Electronically Signed   By: Narda Rutherford M.D.   On: 10/22/2023 14:40   DG Shoulder Right Port  Result Date: 10/22/2023 CLINICAL DATA:  Postop in PACU. EXAM: RIGHT SHOULDER - 1 VIEW COMPARISON:  None Available. FINDINGS: Reverse right shoulder arthroplasty in expected alignment. No periprosthetic lucency or fracture. Recent postsurgical change includes air and edema in the joint space and soft tissues. IMPRESSION: Reverse right shoulder arthroplasty without immediate postoperative complication. Electronically Signed   By: Narda Rutherford M.D.   On: 10/22/2023 14:39    Disposition: Discharge disposition: 01-Home or Self Care       Discharge Instructions     Call MD / Call 911   Complete by: As directed    If you experience chest pain or shortness of breath, CALL 911 and be transported to the hospital emergency room.  If you develope a fever above 101 F, pus (white drainage) or increased drainage or redness at the wound, or calf pain, call your surgeon's  office.   Constipation Prevention   Complete by: As directed    Drink plenty of fluids.  Prune juice may be helpful.  You may use a stool softener, such as Colace (over the counter) 100 mg twice a day.  Use MiraLax (over the counter) for constipation as needed.   Diet - low sodium heart healthy   Complete by: As directed    Discharge instructions   Complete by: As directed    You may shower, dressing is waterproof.  Do not bathe or soak the operative shoulder in a tub, pool.  Use the CPM machine 3 times a day for one hour each time.  No lifting with the operative shoulder. Continue use of the sling.  Follow-up with Dr. August Saucer in ~2 weeks on your given appointment date.  We will remove your adhesive bandage at that time.    Dental Antibiotics:  In most cases prophylactic antibiotics for Dental procdeures after total joint surgery are not necessary.  Exceptions are as follows:  1. History of prior total joint infection  2. Severely immunocompromised (Organ Transplant, cancer chemotherapy, Rheumatoid biologic meds such as Humera)  3. Poorly controlled diabetes (A1C &gt; 8.0, blood glucose over 200)  If you have one of these conditions, contact your surgeon for an antibiotic prescription, prior to your dental procedure.   Increase activity slowly as tolerated   Complete by: As directed    Post-operative opioid taper instructions:   Complete by: As directed    POST-OPERATIVE OPIOID TAPER INSTRUCTIONS: It is important to wean off of your opioid medication as soon as possible. If you do not need pain medication after your surgery it is ok to stop day one. Opioids include: Codeine, Hydrocodone(Norco, Vicodin), Oxycodone(Percocet, oxycontin) and hydromorphone amongst others.  Long term and even short term use of opiods can cause: Increased  pain response Dependence Constipation Depression Respiratory depression And more.  Withdrawal symptoms can include Flu like symptoms Nausea,  vomiting And more Techniques to manage these symptoms Hydrate well Eat regular healthy meals Stay active Use relaxation techniques(deep breathing, meditating, yoga) Do Not substitute Alcohol to help with tapering If you have been on opioids for less than two weeks and do not have pain than it is ok to stop all together.  Plan to wean off of opioids This plan should start within one week post op of your joint replacement. Maintain the same interval or time between taking each dose and first decrease the dose.  Cut the total daily intake of opioids by one tablet each day Next start to increase the time between doses. The last dose that should be eliminated is the evening dose.             SignedKarenann Cai 10/27/2023, 10:06 AM

## 2023-11-06 ENCOUNTER — Encounter: Payer: Self-pay | Admitting: Surgical

## 2023-11-06 ENCOUNTER — Other Ambulatory Visit (INDEPENDENT_AMBULATORY_CARE_PROVIDER_SITE_OTHER): Payer: PPO

## 2023-11-06 ENCOUNTER — Ambulatory Visit (INDEPENDENT_AMBULATORY_CARE_PROVIDER_SITE_OTHER): Payer: PPO | Admitting: Surgical

## 2023-11-06 DIAGNOSIS — Z96611 Presence of right artificial shoulder joint: Secondary | ICD-10-CM | POA: Diagnosis not present

## 2023-11-06 NOTE — Progress Notes (Signed)
Post-Op Visit Note   Patient: Brad Woods           Date of Birth: [birth year]           MRN: [id number] Visit Date: 11/06/2023 PCP: Joycelyn Rua, MD   Assessment & Plan:  Chief Complaint: No chief complaint on file.  Visit Diagnoses:  1. Status post reverse arthroplasty of right shoulder     Plan: Brad Woods is a 76 y.o. male who presents s/p right reverse shoulder arthroplasty on 10/22/23.  Patient is doing well and pain is overall controlled.  Was using CPM machine as instructed but this was too painful so he is just been doing passive and active range of motion exercises by himself..  Denies any chest pain, SOB, fevers, chills.  No complaint of any instability symptoms.  Not taking oxycodone any longer since Monday.  Has been able to wean off of this.  Has been wearing the sling but this is causing numbness in his small finger of the right hand.  Does have a trigger finger in this finger as well..    On exam, patient has range of motion 15 degrees X rotation, 70 degrees abduction, 95 degrees forward elevation passively.  Intact EPL, FPL, finger abduction, finger adduction, pronation/supination, bicep, tricep, deltoid of operative extremity.  Axillary nerve intact with deltoid firing.  Incision is healing well without evidence of infection or dehiscence.  Incision was made sure to be covered with Steri-Strips from the proximal to distal aspect of the length of the incision.  2+ radial pulse of the operative extremity.  Subscap strength intact rated 5 -/5.  Plan is discontinue sling.  Okay to very lightly lift with the operative extremity but no lifting anything heavier than a coffee cup or cell phone.  Start physical therapy to focus on passive range of motion and active range of motion with deltoid isometrics.  Do not want to externally rotate past 30 degrees to protect subscapularis repair.  Follow-up in 4 weeks for clinical recheck with Dr. August Saucer.  Also may consider trigger  finger injection at that time if he is having continued triggering of the right small finger.  Has had prior trigger finger release of the right middle and right ring finger based on the incisions present on the right hand.  Follow-Up Instructions: No follow-ups on file.   Orders:  No orders of the defined types were placed in this encounter.  No orders of the defined types were placed in this encounter.   Imaging: No results found.  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   Morbid obesity (HCC) 07/27/2020   Dizziness 06/22/2020   Exertional dyspnea    Chronic pain of left knee 09/02/2018   Chronic pain of right knee 09/02/2018   History of bilateral knee replacement 09/02/2018   Allergic rhinitis 08/15/2018   Erectile dysfunction 08/15/2018   History of renal stone 08/15/2018   Migraine 08/15/2018   Osteoarthritis 08/15/2018   Hyperlipidemia 08/15/2018   PVD (peripheral vascular disease) (HCC) 08/15/2018   Left shoulder pain 07/30/2018   Chest pain 07/30/2018   Trochanteric bursitis, left hip 05/02/2017   Carpal tunnel syndrome, left upper limb 05/02/2017   Trochanteric bursitis, right hip 03/18/2017   Mixed hyperlipidemia 12/31/2016  Coronary artery disease involving native coronary artery of native heart with angina pectoris (HCC) 11/25/2016   Coronary artery disease involving native coronary artery of native heart without angina pectoris 11/20/2016   Elevated troponin    Unstable angina (HCC) 05/18/2016   Acute coronary syndrome (HCC) 05/18/2016   Benign non-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 10/12/2014   Essential hypertension 10/12/2014   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 10/12/2014   High risk  medication use 10/12/2014   IFG (impaired fasting glucose) 10/12/2014   OSA (obstructive sleep apnea) 02/04/2014   Hypothyroidism 03/02/2013   GERD (gastroesophageal reflux disease) 09/04/2012   Past Medical History:  Diagnosis Date   Allergy    Arthritis    Asthma    when younger   Coronary artery disease    s/p stenting 10/2016   Dyspnea    GERD (gastroesophageal reflux disease)    Headache    Hyperlipidemia    Hypertension    Kidney stones    Myocardial infarction (HCC)    Pneumonia    hx of   Sleep apnea    cpap    Family History  Problem Relation Age of Onset   Colon cancer Mother 30    Past Surgical History:  Procedure Laterality Date   BICEPT TENODESIS Right 10/22/2023   Procedure: BICEPS TENODESIS;  Surgeon: Cammy Copa, MD;  Location: [facility] OR;  Service: Orthopedics;  Laterality: Right;   CARDIAC CATHETERIZATION N/A 05/20/2016   Procedure: Left Heart Cath and Coronary Angiography;  Surgeon: Runell Gess, MD;  Location: [facility] INVASIVE CV LAB;  Service: Cardiovascular;  Laterality: N/A;   CARDIAC CATHETERIZATION N/A 11/22/2016   Procedure: Left Heart Cath and Coronary Angiography;  Surgeon: Yvonne Kendall, MD;  Location: [facility] INVASIVE CV LAB;  Service: Cardiovascular;  Laterality: N/A;   CARPAL TUNNEL RELEASE Right    COLONOSCOPY     EYE SURGERY Left    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, 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   INGUINAL HERNIA REPAIR  1955   LEFT HEART CATH AND CORONARY ANGIOGRAPHY N/A 05/31/2020   Procedure: LEFT HEART CATH AND CORONARY ANGIOGRAPHY;  Surgeon: Lennette Bihari, MD;  Location: [facility] INVASIVE CV LAB;  Service: Cardiovascular;  Laterality: N/A;   RECTAL SURGERY     REPLACEMENT TOTAL KNEE Bilateral    REVERSE SHOULDER ARTHROPLASTY Right 10/22/2023   Procedure: RIGHT REVERSE SHOULDER ARTHROPLASTY;  Surgeon: Cammy Copa, MD;  Location: [facility] OR;  Service: Orthopedics;  Laterality: Right;   TONSILLECTOMY     Social History    Occupational History   Not on file  Tobacco Use   Smoking status: Never   Smokeless tobacco: Never  Vaping Use   Vaping status: Never Used  Substance and Sexual Activity   Alcohol use: Yes    Comment: maybe 1 beer twice a month   Drug use: No   Sexual activity: Yes    Birth control/protection: None

## 2023-11-07 DIAGNOSIS — M19011 Primary osteoarthritis, right shoulder: Secondary | ICD-10-CM | POA: Diagnosis not present

## 2023-11-12 DIAGNOSIS — M19011 Primary osteoarthritis, right shoulder: Secondary | ICD-10-CM | POA: Diagnosis not present

## 2023-11-14 DIAGNOSIS — M19011 Primary osteoarthritis, right shoulder: Secondary | ICD-10-CM | POA: Diagnosis not present

## 2023-11-18 ENCOUNTER — Ambulatory Visit (INDEPENDENT_AMBULATORY_CARE_PROVIDER_SITE_OTHER): Payer: PPO | Admitting: Surgical

## 2023-11-18 ENCOUNTER — Telehealth: Payer: Self-pay | Admitting: *Deleted

## 2023-11-18 ENCOUNTER — Encounter: Payer: Self-pay | Admitting: Surgical

## 2023-11-18 DIAGNOSIS — M79601 Pain in right arm: Secondary | ICD-10-CM

## 2023-11-18 DIAGNOSIS — M19011 Primary osteoarthritis, right shoulder: Secondary | ICD-10-CM | POA: Diagnosis not present

## 2023-11-18 DIAGNOSIS — Z96611 Presence of right artificial shoulder joint: Secondary | ICD-10-CM

## 2023-11-18 MED ORDER — GABAPENTIN 300 MG PO CAPS: 300.0000 mg | ORAL_CAPSULE | Freq: Three times a day (TID) | ORAL | 0 refills | Status: DC

## 2023-11-18 MED ORDER — OXYCODONE HCL 5 MG PO TABS: 5.0000 mg | ORAL_TABLET | Freq: Three times a day (TID) | ORAL | 0 refills | Status: DC | PRN

## 2023-11-18 NOTE — Progress Notes (Signed)
Post-Op Visit Note   Patient: Brad Woods           Date of Birth: April 23, 1946           MRN: [id number] Visit Date: 11/18/2023 PCP: Joycelyn Rua, MD   Assessment & Plan:  Chief Complaint: No chief complaint on file.  Visit Diagnoses:  1. Status post reverse arthroplasty of right shoulder     Plan: Patient is a 77 year old male who presents s/p right reverse shoulder arthroplasty on 10/22/2023.  He is doing well in regards to his shoulder.  He is doing physical therapy twice a week for his shoulder and feels that he has good range of motion.  Taking Advil and Tylenol as well as oxycodone.  Mostly taking oxycodone for his elbow and forearm pain.  He has radiating pain from the ulnar aspect of the elbow that radiates down the ulnar aspect of the forearm with hypersensitivity and numbness/tingling that affects the ring and small fingers on the palmar aspect.  He does not really have any significant neck pain.  Does have occasional scapular and trapezius pain.  He is right-hand dominant.  He does have history of prior carpal tunnel release by Dr. Simonne Come that was done years ago.  He states that this pain is actually worse than his shoulder is currently and he is taking oxycodone for this and not his shoulder any longer.  On exam, patient has intact EPL, FPL, finger abduction with strength rated 5/5.  Does have positive Tinel sign over the cubital tunnel with reproduction of radiating pain down the arm.  Positive elbow flexion test.  Positive Froment's sign.  No significant pain with cervical spine range of motion.  Subluxation of ulnar nerve is present on the right side but not the left elbow.  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  Does have subluxing ulnar nerve on exam.  We discussed options availed the  patient and settled on trying gabapentin and topical Voltaren over the cubital tunnel.  Will get nerve conduction study to evaluate for ulnar neuropathy of the right arm.  Follow-up after nerve conduction study to review results with Dr. August Saucer at his next scheduled appointment in about 2 weeks.  Follow-Up Instructions: Return Follow-up at next scheduled appointment.   Orders:  No orders of the defined types were placed in this encounter.  No orders of the defined types were placed in this encounter.   Imaging: No results found.  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   Morbid obesity (HCC) 07/27/2020   Dizziness 06/22/2020   Exertional dyspnea    Chronic pain of left knee 09/02/2018   Chronic pain of right knee 09/02/2018   History of bilateral knee replacement 09/02/2018   Allergic rhinitis 08/15/2018   Erectile dysfunction 08/15/2018   History of renal stone 08/15/2018   Migraine 08/15/2018   Osteoarthritis 08/15/2018   Hyperlipidemia 08/15/2018   PVD (peripheral vascular disease) (HCC) 08/15/2018   Left shoulder pain 07/30/2018   Chest pain 07/30/2018   Trochanteric bursitis, left hip 05/02/2017   Carpal tunnel syndrome, left upper limb  05/02/2017   Trochanteric bursitis, right hip 03/18/2017   Mixed hyperlipidemia 12/31/2016   Coronary artery disease involving native coronary artery of native heart with angina pectoris (HCC) 11/25/2016   Coronary artery disease involving native coronary artery of native heart without angina pectoris 11/20/2016   Elevated troponin    Unstable angina (HCC) 05/18/2016   Acute coronary syndrome (HCC) 05/18/2016   Benign non-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  10/12/2014   Essential hypertension 10/12/2014   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 10/12/2014   High risk medication use 10/12/2014   IFG (impaired fasting glucose) 10/12/2014   OSA (obstructive sleep apnea) 02/04/2014   Hypothyroidism 03/02/2013   GERD (gastroesophageal reflux disease) 09/04/2012   Past Medical History:  Diagnosis Date   Allergy    Arthritis    Asthma    when younger   Coronary artery disease    s/p stenting 10/2016   Dyspnea    GERD (gastroesophageal reflux disease)    Headache    Hyperlipidemia    Hypertension    Kidney stones    Myocardial infarction (HCC)    Pneumonia    hx of   Sleep apnea    cpap    Family History  Problem Relation Age of Onset   Colon cancer Mother 15    Past Surgical History:  Procedure Laterality Date   BICEPT TENODESIS Right 10/22/2023   Procedure: BICEPS TENODESIS;  Surgeon: Cammy Copa, MD;  Location[facility] OR;  Service: Orthopedics;  Laterality: Right;   CARDIAC CATHETERIZATION N/A 05/20/2016   Procedure: Left Heart Cath and Coronary Angiography;  Surgeon: Runell Gess, MD;  Location: [facility] INVASIVE CV LAB;  Service: Cardiovascular;  Laterality: N/A;   CARDIAC CATHETERIZATION N/A 11/22/2016   Procedure: Left Heart Cath and Coronary Angiography;  Surgeon: Yvonne Kendall, MD;  Location: [facility] INVASIVE CV LAB;  Service: Cardiovascular;  Laterality: N/A;   CARPAL TUNNEL RELEASE Right    COLONOSCOPY     EYE SURGERY Left    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, 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   INGUINAL HERNIA REPAIR  1955   LEFT HEART CATH AND CORONARY ANGIOGRAPHY N/A 05/31/2020   Procedure: LEFT HEART CATH AND CORONARY ANGIOGRAPHY;  Surgeon: Lennette Bihari, MD;  Location: [facility] INVASIVE CV LAB;  Service: Cardiovascular;  Laterality: N/A;   RECTAL SURGERY     REPLACEMENT TOTAL KNEE Bilateral    REVERSE SHOULDER ARTHROPLASTY Right 10/22/2023   Procedure: RIGHT REVERSE SHOULDER ARTHROPLASTY;  Surgeon: Cammy Copa, MD;   Location: [facility] OR;  Service: Orthopedics;  Laterality: Right;   TONSILLECTOMY     Social History   Occupational History   Not on file  Tobacco Use   Smoking status: Never   Smokeless tobacco: Never  Vaping Use   Vaping status: Never Used  Substance and Sexual Activity   Alcohol use: Yes    Comment: maybe 1 beer twice a month   Drug use: No   Sexual activity: Yes    Birth control/protection: None

## 2023-11-18 NOTE — Telephone Encounter (Signed)
Okay to see me today at 1 PM if he wants

## 2023-11-18 NOTE — Telephone Encounter (Signed)
Patient called this morning stating his therapy iat [facility] is going well for his Right shoulder (S/P Right Reverse arthroplasty on 10/22/23), but his right arm/wrist/hand are causing him much pain. He c/o of it falling asleep frequently and can't stand even putting it on the arm rest in the car. He reports radiating pain/aching up inside arm toward back of shoulder through his "funny bone". No sleeping and wanted to know what he can do. Do you want me to see if we can get him scheduled to be seen this week on either of your schedules?

## 2023-11-19 DIAGNOSIS — K5900 Constipation, unspecified: Secondary | ICD-10-CM | POA: Diagnosis not present

## 2023-11-19 DIAGNOSIS — I2510 Atherosclerotic heart disease of native coronary artery without angina pectoris: Secondary | ICD-10-CM | POA: Diagnosis not present

## 2023-11-19 DIAGNOSIS — Z6836 Body mass index (BMI) 36.0-36.9, adult: Secondary | ICD-10-CM | POA: Diagnosis not present

## 2023-11-19 DIAGNOSIS — M1990 Unspecified osteoarthritis, unspecified site: Secondary | ICD-10-CM | POA: Diagnosis not present

## 2023-11-26 DIAGNOSIS — M19011 Primary osteoarthritis, right shoulder: Secondary | ICD-10-CM | POA: Diagnosis not present

## 2023-11-28 DIAGNOSIS — M19011 Primary osteoarthritis, right shoulder: Secondary | ICD-10-CM | POA: Diagnosis not present

## 2023-12-03 ENCOUNTER — Ambulatory Visit: Payer: PPO | Admitting: Physical Medicine and Rehabilitation

## 2023-12-03 DIAGNOSIS — R202 Paresthesia of skin: Secondary | ICD-10-CM | POA: Diagnosis not present

## 2023-12-03 DIAGNOSIS — M79641 Pain in right hand: Secondary | ICD-10-CM

## 2023-12-03 DIAGNOSIS — M19011 Primary osteoarthritis, right shoulder: Secondary | ICD-10-CM | POA: Diagnosis not present

## 2023-12-03 DIAGNOSIS — R531 Weakness: Secondary | ICD-10-CM

## 2023-12-03 NOTE — Progress Notes (Unsigned)
Functional Pain Scale - descriptive words and definitions  {CHL AMB ORTHO FUNCTIONAL PAIN SCALE:28673}  Average Pain {NUMBERS; 0-10:5044}   RUE NCS, Numbness & burning feeling in right hand and picky. He has some difficulty grasping. Started after shoulder surgery (it was on 12/29

## 2023-12-04 ENCOUNTER — Encounter: Payer: Self-pay | Admitting: Orthopedic Surgery

## 2023-12-04 ENCOUNTER — Ambulatory Visit (INDEPENDENT_AMBULATORY_CARE_PROVIDER_SITE_OTHER): Payer: PPO | Admitting: Orthopedic Surgery

## 2023-12-04 ENCOUNTER — Encounter: Payer: Self-pay | Admitting: Physical Medicine and Rehabilitation

## 2023-12-04 DIAGNOSIS — M79641 Pain in right hand: Secondary | ICD-10-CM

## 2023-12-04 NOTE — Progress Notes (Signed)
Brad Woods - 77 y.o. male MRN [id number]  Date of birth: 06-08-1946  Office Visit Note: Visit Date: 12/03/2023 PCP: Joycelyn Rua, MD Referred by: Julieanne Cotton, PA-C  Subjective: Chief Complaint  Patient presents with   Right Hand - Numbness   HPI: Brad Woods is a 77 y.o. male who comes in today at the request of Dr. Burnard Bunting for evaluation and management of chronic, worsening and severe pain, numbness and tingling in the Right upper extremities.  Patient is Right hand dominant.  He is status postright reverse shoulder arthroplasty on 10/22/2023.  Since that time he has complained of pretty significant left forearm pain radiating up into the elbow down into the hand with ulnar digit sensitivity and tingling numbness.  He has felt some weakness.  He had no symptoms prior to the surgery.  He was noted by Dr. August Saucer and Franky Macho to have subluxation of the ulnar nerve and what they felt was a positive Froment's sign.  Their feeling was possible cubital tunnel syndrome.  He has a remote history of right carpal tunnel release as well as trigger finger release at the same time many years ago.  He does not really have symptoms in the median nerve distribution.  He has been using gabapentin as well as Voltaren gel without much noted improvement.  He does endorse some neck pain but no frank radicular pain.  His case is complicated by peripheral vascular disease and coronary artery disease but no known diabetes.  He has had a history of bilateral knee replacements as well with significant osteoarthritis.  No prior electrodiagnostic studies to review.  He feels that he may have had a test done prior to the carpal tunnel release.   I spent more than 30 minutes speaking face-to-face with the patient with 50% of the time in counseling and discussing coordination of care.      Review of Systems  Musculoskeletal:  Positive for joint pain.  Neurological:  Positive for tingling and weakness.   All other systems reviewed and are negative.  Otherwise per HPI.  Assessment & Plan: Visit Diagnoses:    ICD-10-CM   1. Paresthesia of skin  R20.2 NCV with EMG (electromyography)    2. Pain in right hand  M79.641     3. Weakness  R53.1        Plan: Impression: Clinically does seem to be an ulnar neuritis or neuropathy with clinical signs that this would be at the elbow and most likely at the elbow with cubital tunnel syndrome.  This is in the setting of prior carpal tunnel release many years ago and a more recent total shoulder replacement.  Electrodiagnostic study performed.  The above electrodiagnostic study is ABNORMAL but somewhat hard to interpret.  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  This would not fit with his current symptoms however.  **There is however reduced amplitude of the ulnar sensory nerve at the wrist as well as mild needle EMG denervation findings of the FDI musculature.  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    There is no significant electrodiagnostic evidence of any other focal nerve entrapment, brachial plexopathy or cervical radiculopathy.   Recommendations: 1.  Follow-up with referring physician. 2.  Continue current management of symptoms.  Consider Imaging evaluation at the wrist and clinical  correlation.  Could monitor to see if this improves with repeat electrodiagnostic study in the next 2 months.  Meds & Orders: No orders of the defined types were placed in this encounter.   Orders Placed This Encounter  Procedures   NCV with EMG (electromyography)    Follow-up: Return for [employer], PA-C.   Procedures: No procedures performed  EMG & NCV Findings: Evaluation of the right median (across palm) sensory nerve showed prolonged distal peak latency (Wrist, 4.0 ms).  The right ulnar  sensory nerve showed reduced amplitude (2.9 V).  All remaining nerves (as indicated in the following tables) were within normal limits.    Needle evaluation of the right first dorsal interosseous muscle showed increased insertional activity, moderately increased spontaneous activity, and diminished recruitment.  All remaining muscles (as indicated in the following table) showed no evidence of electrical instability.    Impression: The above electrodiagnostic study is ABNORMAL but somewhat hard to interpret.  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  This would not fit with his current symptoms however.  **There is however reduced amplitude of the ulnar sensory nerve at the wrist as well as mild needle EMG denervation findings of the FDI musculature.  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    There is no significant electrodiagnostic evidence of any other focal nerve entrapment, brachial plexopathy or cervical radiculopathy.   Recommendations: 1.  Follow-up with referring physician. 2.  Continue current management of symptoms.  Consider Imaging evaluation at the wrist and clinical correlation.  Could monitor to see if this improves with repeat electrodiagnostic study in the next 2 months.  ___________________________ Naaman Plummer [facility] Board Certified, American Board of Physical Medicine and Rehabilitation    Nerve Conduction Studies Anti Sensory Summary Table   Stim Site NR Peak (ms) Norm Peak (ms) P-T Amp (V) Norm P-T Amp Site1 Site2 Delta-P (ms) Dist (cm) Vel (m/s) Norm Vel (m/s)  Right Median Acr Palm Anti Sensory (2nd Digit)  30.3C  Wrist    *4.0 <3.6 18.5 >10 Wrist Palm 2.0 0.0    Palm    2.0 <2.0 20.3         Right Radial Anti Sensory (Base 1st Digit)  29.9C  Wrist    2.6 <3.1 17.7  Wrist Base 1st Digit 2.6 0.0     Right Ulnar Anti Sensory (5th Digit)  30.9C  Wrist    3.5 <3.7 *2.9 >15.0 Wrist 5th Digit 3.5 14.0 40 >38   Motor Summary Table   Stim Site NR Onset (ms) Norm Onset (ms) O-P Amp (mV) Norm O-P Amp Site1 Site2 Delta-0 (ms) Dist (cm) Vel (m/s) Norm Vel (m/s)  Right Median Motor (Abd Poll Brev)  30.9C  Wrist    3.7 <4.2 7.2 >5 Elbow Wrist 4.5 23.0 51 >50  Elbow    8.2  6.0         Right Ulnar Motor (Abd Dig Min)  31.3C  Wrist    3.0 <4.2 5.9 >3 B Elbow Wrist 4.7 25.0 53 >53  B Elbow    7.7  4.4  A Elbow B Elbow 1.2 10.0 83 >53  A Elbow    8.9  3.7          EMG   Side Muscle Nerve Root Ins Act Fibs Psw Amp Dur Poly Recrt Int Dennie Bible  Comment  Right Abd Poll Brev Median C8-T1 Nml Nml Nml Nml Nml 0 Nml Nml   Right 1stDorInt Ulnar C8-T1 *CRD *2+ *2+ Nml Nml 0 *Reduced Nml   Right PronatorTeres Median C6-7 Nml Nml Nml Nml Nml 0 Nml Nml   Right Biceps Musculocut C5-6 Nml Nml Nml Nml Nml 0 Nml Nml   Right Deltoid Axillary C5-6 Nml Nml Nml Nml Nml 0 Nml Nml     Nerve Conduction Studies Anti Sensory Left/Right Comparison   Stim Site L Lat (ms) R Lat (ms) L-R Lat (ms) L Amp (V) R Amp (V) L-R Amp (%) Site1 Site2 L Vel (m/s) R Vel (m/s) L-R Vel (m/s)  Median Acr Palm Anti Sensory (2nd Digit)  30.3C  Wrist  *4.0   18.5  Wrist Palm     Palm  2.0   20.3        Radial Anti Sensory (Base 1st Digit)  29.9C  Wrist  2.6   17.7  Wrist Base 1st Digit     Ulnar Anti Sensory (5th Digit)  30.9C  Wrist  3.5   *2.9  Wrist 5th Digit  40    Motor Left/Right Comparison   Stim Site L Lat (ms) R Lat (ms) L-R Lat (ms) L Amp (mV) R Amp (mV) L-R Amp (%) Site1 Site2 L Vel (m/s) R Vel (m/s) L-R Vel (m/s)  Median Motor (Abd Poll Brev)  30.9C  Wrist  3.7   7.2  Elbow Wrist  51   Elbow  8.2   6.0        Ulnar Motor (Abd Dig Min)  31.3C  Wrist  3.0   5.9  B Elbow Wrist  53   B Elbow  7.7   4.4  A Elbow B Elbow  83   A Elbow  8.9   3.7           Waveforms:             Clinical History: No specialty  comments available.   He reports that he has never smoked. He has never used smokeless tobacco. No results for input(s): "HGBA1C", "LABURIC" in the last 8760 hours.  Objective:  VS:  HT:    WT:   BMI:     BP:   HR: bpm  TEMP: ( )  RESP:  Physical Exam Vitals and nursing note reviewed.  Constitutional:      General: He is not in acute distress.    Appearance: Normal appearance. He is well-developed. He is obese.  HENT:     Head: Normocephalic and atraumatic.  Eyes:     Conjunctiva/sclera: Conjunctivae normal.     Pupils: Pupils are equal, round, and reactive to light.  Cardiovascular:     Rate and Rhythm: Normal rate.     Pulses: Normal pulses.     Heart sounds: Normal heart sounds.  Pulmonary:     Effort: Pulmonary effort is normal. No respiratory distress.  Musculoskeletal:        General: No tenderness.     Cervical back: Normal range of motion and neck supple. No rigidity.     Right lower leg: No edema.     Left lower leg: No edema.     Comments: Inspection reveals no atrophy of the bilateral APB or FDI or hand intrinsics. There is no swelling, color changes, allodynia or dystrophic changes. There is 5 out of 5 strength in the bilateral wrist extension, finger abduction and long finger flexion. There is  dysesthesia in the right ulnar nerve distribution.  Seemingly equivocal Froment's test on the right compared to left.  There is a positive Tinel's at the right elbow.  There is a negative Phalen's test bilaterally. There is a negative Hoffmann's test bilaterally.  Skin:    General: Skin is warm and dry.     Findings: No erythema or rash.  Neurological:     General: No focal deficit present.     Mental Status: He is alert and oriented to person, place, and time.     Sensory: No sensory deficit.     Motor: No weakness or abnormal muscle tone.     Coordination: Coordination normal.     Gait: Gait normal.  Psychiatric:        Mood and Affect: Mood normal.        Behavior:  Behavior normal.        Thought Content: Thought content normal.     Ortho Exam  Imaging: No results found.  Past Medical/Family/Surgical/Social History: Medications & Allergies reviewed per EMR, new medications updated. 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   Morbid obesity (HCC) 07/27/2020   Dizziness 06/22/2020   Exertional dyspnea    Chronic pain of left knee 09/02/2018   Chronic pain of right knee 09/02/2018   History of bilateral knee replacement 09/02/2018   Allergic rhinitis 08/15/2018   Erectile dysfunction 08/15/2018   History of renal stone 08/15/2018   Migraine 08/15/2018   Osteoarthritis 08/15/2018   Hyperlipidemia 08/15/2018   PVD (peripheral vascular disease) (HCC) 08/15/2018   Left shoulder pain 07/30/2018   Chest pain 07/30/2018   Trochanteric bursitis, left hip 05/02/2017   Carpal tunnel syndrome, left upper limb 05/02/2017   Trochanteric bursitis, right hip 03/18/2017   Mixed hyperlipidemia 12/31/2016   Coronary artery disease involving native coronary artery of native heart with angina pectoris (HCC) 11/25/2016   Coronary artery disease involving native coronary artery of native heart without angina pectoris 11/20/2016   Elevated troponin    Unstable angina (HCC) 05/18/2016   Acute coronary syndrome (HCC) 05/18/2016   Benign non-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 10/12/2014   Essential hypertension 10/12/2014   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 10/12/2014   High risk medication use 10/12/2014   IFG (impaired fasting glucose) 10/12/2014   OSA (obstructive sleep apnea) 02/04/2014   Hypothyroidism 03/02/2013   GERD (gastroesophageal reflux disease) 09/04/2012   Past Medical History:  Diagnosis  Date   Allergy    Arthritis    Asthma    when younger   Coronary artery disease    s/p stenting 10/2016   Dyspnea    GERD (gastroesophageal reflux disease)    Headache    Hyperlipidemia    Hypertension    Kidney stones    Myocardial infarction (HCC)    Pneumonia    hx of   Sleep apnea    cpap   Family History  Problem Relation Age of Onset   Colon cancer Mother 22   Past Surgical History:  Procedure Laterality Date   BICEPT TENODESIS Right 10/22/2023   Procedure: BICEPS TENODESIS;  Surgeon: Cammy Copa, MD;  Location: [facility] OR;  Service: Orthopedics;  Laterality: Right;   CARDIAC CATHETERIZATION N/A 05/20/2016   Procedure: Left Heart Cath and  Coronary Angiography;  Surgeon: Runell Gess, MD;  Location: [facility] INVASIVE CV LAB;  Service: Cardiovascular;  Laterality: N/A;   CARDIAC CATHETERIZATION N/A 11/22/2016   Procedure: Left Heart Cath and Coronary Angiography;  Surgeon: Yvonne Kendall, MD;  Location: [facility] INVASIVE CV LAB;  Service: Cardiovascular;  Laterality: N/A;   CARPAL TUNNEL RELEASE Right    COLONOSCOPY     EYE SURGERY Left    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, 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   INGUINAL HERNIA REPAIR  1955   LEFT HEART CATH AND CORONARY ANGIOGRAPHY N/A 05/31/2020   Procedure: LEFT HEART CATH AND CORONARY ANGIOGRAPHY;  Surgeon: Lennette Bihari, MD;  Location: [facility] INVASIVE CV LAB;  Service: Cardiovascular;  Laterality: N/A;   RECTAL SURGERY     REPLACEMENT TOTAL KNEE Bilateral    REVERSE SHOULDER ARTHROPLASTY Right 10/22/2023   Procedure: RIGHT REVERSE SHOULDER ARTHROPLASTY;  Surgeon: Cammy Copa, MD;  Location: [facility] OR;  Service: Orthopedics;  Laterality: Right;   TONSILLECTOMY     Social History   Occupational History   Not on file  Tobacco Use   Smoking status: Never   Smokeless tobacco: Never  Vaping Use   Vaping status: Never Used  Substance and Sexual Activity   Alcohol use: Yes    Comment: maybe 1 beer twice a month    Drug use: No   Sexual activity: Yes    Birth control/protection: None

## 2023-12-04 NOTE — Procedures (Signed)
EMG & NCV Findings: Evaluation of the right median (across palm) sensory nerve showed prolonged distal peak latency (Wrist, 4.0 ms).  The right ulnar sensory nerve showed reduced amplitude (2.9 V).  All remaining nerves (as indicated in the following tables) were within normal limits.    Needle evaluation of the right first dorsal interosseous muscle showed increased insertional activity, moderately increased spontaneous activity, and diminished recruitment.  All remaining muscles (as indicated in the following table) showed no evidence of electrical instability.    Impression: The above electrodiagnostic study is ABNORMAL but somewhat hard to interpret.  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  This would not fit with his current symptoms however.  **There is however reduced amplitude of the ulnar sensory nerve at the wrist as well as mild needle EMG denervation findings of the FDI musculature.  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    There is no significant electrodiagnostic evidence of any other focal nerve entrapment, brachial plexopathy or cervical radiculopathy.   Recommendations: 1.  Follow-up with referring physician. 2.  Continue current management of symptoms.  Consider Imaging evaluation at the wrist and clinical correlation.  Could monitor to see if this improves with repeat electrodiagnostic study in the next 2 months.  ___________________________ Naaman Plummer [facility] Board Certified, American Board of Physical Medicine and Rehabilitation    Nerve Conduction Studies Anti Sensory Summary Table   Stim Site NR Peak (ms) Norm Peak (ms) P-T Amp (V) Norm P-T Amp Site1 Site2 Delta-P (ms) Dist (cm) Vel (m/s) Norm Vel (m/s)  Right Median Acr Palm Anti Sensory (2nd Digit)  30.3C  Wrist    *4.0 <3.6 18.5 >10 Wrist Palm  2.0 0.0    Palm    2.0 <2.0 20.3         Right Radial Anti Sensory (Base 1st Digit)  29.9C  Wrist    2.6 <3.1 17.7  Wrist Base 1st Digit 2.6 0.0    Right Ulnar Anti Sensory (5th Digit)  30.9C  Wrist    3.5 <3.7 *2.9 >15.0 Wrist 5th Digit 3.5 14.0 40 >38   Motor Summary Table   Stim Site NR Onset (ms) Norm Onset (ms) O-P Amp (mV) Norm O-P Amp Site1 Site2 Delta-0 (ms) Dist (cm) Vel (m/s) Norm Vel (m/s)  Right Median Motor (Abd Poll Brev)  30.9C  Wrist    3.7 <4.2 7.2 >5 Elbow Wrist 4.5 23.0 51 >50  Elbow    8.2  6.0         Right Ulnar Motor (Abd Dig Min)  31.3C  Wrist    3.0 <4.2 5.9 >3 B Elbow Wrist 4.7 25.0 53 >53  B Elbow    7.7  4.4  A Elbow B Elbow 1.2 10.0 83 >53  A Elbow    8.9  3.7          EMG   Side Muscle Nerve Root Ins Act Fibs Psw Amp Dur Poly Recrt Int Dennie Bible Comment  Right Abd Poll Brev Median C8-T1 Nml Nml Nml Nml Nml 0 Nml Nml   Right 1stDorInt Ulnar C8-T1 *CRD *2+ *2+ Nml Nml 0 *Reduced Nml   Right PronatorTeres Median C6-7 Nml Nml Nml Nml Nml 0 Nml Nml   Right Biceps Musculocut C5-6 Nml Nml Nml Nml Nml 0 Nml Nml   Right Deltoid Axillary C5-6  Nml Nml Nml Nml Nml 0 Nml Nml     Nerve Conduction Studies Anti Sensory Left/Right Comparison   Stim Site L Lat (ms) R Lat (ms) L-R Lat (ms) L Amp (V) R Amp (V) L-R Amp (%) Site1 Site2 L Vel (m/s) R Vel (m/s) L-R Vel (m/s)  Median Acr Palm Anti Sensory (2nd Digit)  30.3C  Wrist  *4.0   18.5  Wrist Palm     Palm  2.0   20.3        Radial Anti Sensory (Base 1st Digit)  29.9C  Wrist  2.6   17.7  Wrist Base 1st Digit     Ulnar Anti Sensory (5th Digit)  30.9C  Wrist  3.5   *2.9  Wrist 5th Digit  40    Motor Left/Right Comparison   Stim Site L Lat (ms) R Lat (ms) L-R Lat (ms) L Amp (mV) R Amp (mV) L-R Amp (%) Site1 Site2 L Vel (m/s) R Vel (m/s) L-R Vel (m/s)  Median Motor (Abd Poll Brev)  30.9C  Wrist  3.7   7.2  Elbow Wrist  51   Elbow  8.2   6.0        Ulnar Motor (Abd Dig Min)  31.3C  Wrist  3.0   5.9  B Elbow  Wrist  53   B Elbow  7.7   4.4  A Elbow B Elbow  83   A Elbow  8.9   3.7           Waveforms:

## 2023-12-04 NOTE — Progress Notes (Unsigned)
Post-Op Visit Note   Patient: Brad Woods           Date of Birth: September 23, 1946           MRN: [id number] Visit Date: 12/04/2023 PCP: Joycelyn Rua, MD   Assessment & Plan:  Chief Complaint:  Chief Complaint  Patient presents with   Right Shoulder - Routine Post Op    POST OP/RIGHT RSA, BT (surgery date 10-22-23)   Visit Diagnoses:  1. Pain in right hand     Plan: Brad Woods is a patient who underwent right RSA 10/22/2023.  Doing well from that but he is having some numbness and tingling in the wrist.  Really only palmar numbness in digits 4 and 5.  Since he was last seen has had an EMG nerve study which shows ulnar nerve compression likely across the wrist.  Elbow looked okay.  He is doing well from physical therapy standpoint on the shoulder.  Therapy note reviewed with 140 of flexion 100 of abduction and external rotation to 70.  Forearm symptoms are better but the hand and finger numbness is worse.  He also reports trigger finger on that right fifth finger.  Impression his possible ulnar nerve compression at the wrist.  Differential diagnosis would be ganglion cyst versus ulnar artery thrombosis versus hook of hamate problem.  MRI right wrist indicated to evaluate Guyon's canal for nerve compression and he will follow-up after that.  Does have a history of prior carpal tunnel release.  Follow-Up Instructions: No follow-ups on file.   Orders:  Orders Placed This Encounter  Procedures   MR Wrist Right w/o contrast   No orders of the defined types were placed in this encounter.   Imaging: No results found.  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   Morbid obesity (HCC) 07/27/2020   Dizziness 06/22/2020   Exertional  dyspnea    Chronic pain of left knee 09/02/2018   Chronic pain of right knee 09/02/2018   History of bilateral knee replacement 09/02/2018   Allergic rhinitis 08/15/2018   Erectile dysfunction 08/15/2018   History of renal stone 08/15/2018   Migraine 08/15/2018   Osteoarthritis 08/15/2018   Hyperlipidemia 08/15/2018   PVD (peripheral vascular disease) (HCC) 08/15/2018   Left shoulder pain 07/30/2018   Chest pain 07/30/2018   Trochanteric bursitis, left hip 05/02/2017   Carpal tunnel syndrome, left upper limb 05/02/2017   Trochanteric bursitis, right hip 03/18/2017   Mixed hyperlipidemia 12/31/2016   Coronary artery disease involving native coronary artery of native heart with angina pectoris (HCC) 11/25/2016   Coronary artery disease involving native coronary artery of native heart without angina pectoris 11/20/2016   Elevated troponin    Unstable angina (HCC) 05/18/2016   Acute coronary syndrome (HCC) 05/18/2016   Benign non-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 10/12/2014   Essential hypertension 10/12/2014   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 10/12/2014   High risk medication use 10/12/2014   IFG (impaired fasting glucose) 10/12/2014   OSA (obstructive sleep apnea) 02/04/2014   Hypothyroidism 03/02/2013   GERD (gastroesophageal reflux disease) 09/04/2012   Past Medical History:  Diagnosis Date   Allergy    Arthritis    Asthma    when younger   Coronary artery disease  s/p stenting 10/2016   Dyspnea    GERD (gastroesophageal reflux disease)    Headache    Hyperlipidemia    Hypertension    Kidney stones    Myocardial infarction (HCC)    Pneumonia    hx of   Sleep apnea    cpap    Family History  Problem Relation Age of Onset   Colon cancer Mother 28    Past Surgical History:  Procedure Laterality Date   BICEPT TENODESIS Right 10/22/2023   Procedure: BICEPS TENODESIS;  Surgeon: Cammy Copa, MD;  Location: [facility] OR;  Service: Orthopedics;   Laterality: Right;   CARDIAC CATHETERIZATION N/A 05/20/2016   Procedure: Left Heart Cath and Coronary Angiography;  Surgeon: Runell Gess, MD;  Location: [facility] INVASIVE CV LAB;  Service: Cardiovascular;  Laterality: N/A;   CARDIAC CATHETERIZATION N/A 11/22/2016   Procedure: Left Heart Cath and Coronary Angiography;  Surgeon: Yvonne Kendall, MD;  Location: [facility] INVASIVE CV LAB;  Service: Cardiovascular;  Laterality: N/A;   CARPAL TUNNEL RELEASE Right    COLONOSCOPY     EYE SURGERY Left    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, 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   INGUINAL HERNIA REPAIR  1955   LEFT HEART CATH AND CORONARY ANGIOGRAPHY N/A 05/31/2020   Procedure: LEFT HEART CATH AND CORONARY ANGIOGRAPHY;  Surgeon: Lennette Bihari, MD;  Location: [facility] INVASIVE CV LAB;  Service: Cardiovascular;  Laterality: N/A;   RECTAL SURGERY     REPLACEMENT TOTAL KNEE Bilateral    REVERSE SHOULDER ARTHROPLASTY Right 10/22/2023   Procedure: RIGHT REVERSE SHOULDER ARTHROPLASTY;  Surgeon: Cammy Copa, MD;  Location: [facility] OR;  Service: Orthopedics;  Laterality: Right;   TONSILLECTOMY     Social History   Occupational History   Not on file  Tobacco Use   Smoking status: Never   Smokeless tobacco: Never  Vaping Use   Vaping status: Never Used  Substance and Sexual Activity   Alcohol use: Yes    Comment: maybe 1 beer twice a month   Drug use: No   Sexual activity: Yes    Birth control/protection: None

## 2023-12-05 DIAGNOSIS — M19011 Primary osteoarthritis, right shoulder: Secondary | ICD-10-CM | POA: Diagnosis not present

## 2023-12-10 DIAGNOSIS — M19011 Primary osteoarthritis, right shoulder: Secondary | ICD-10-CM | POA: Diagnosis not present

## 2023-12-12 DIAGNOSIS — M19011 Primary osteoarthritis, right shoulder: Secondary | ICD-10-CM | POA: Diagnosis not present

## 2023-12-16 ENCOUNTER — Other Ambulatory Visit: Payer: Self-pay | Admitting: Surgical

## 2023-12-16 ENCOUNTER — Other Ambulatory Visit: Payer: Self-pay | Admitting: Orthopedic Surgery

## 2023-12-16 MED ORDER — GABAPENTIN 300 MG PO CAPS: 300.0000 mg | ORAL_CAPSULE | Freq: Three times a day (TID) | ORAL | 0 refills | Status: DC

## 2023-12-16 NOTE — Telephone Encounter (Signed)
Patient called requesting refill of his Gabapentin. Pharmacy [facility] in [place]. I think a refill request has come through to [place] as well from pharmacy. Sorry if duplicate. Thank you.

## 2023-12-16 NOTE — Telephone Encounter (Signed)
Sent thx

## 2023-12-21 ENCOUNTER — Ambulatory Visit
Admission: RE | Admit: 2023-12-21 | Discharge: 2023-12-21 | Disposition: A | Discharge status: Home / Self Care | Payer: PPO | Source: Ambulatory Visit | Attending: Orthopedic Surgery | Admitting: Orthopedic Surgery

## 2023-12-21 DIAGNOSIS — M25531 Pain in right wrist: Secondary | ICD-10-CM | POA: Diagnosis not present

## 2023-12-21 DIAGNOSIS — M79641 Pain in right hand: Secondary | ICD-10-CM

## 2023-12-30 ENCOUNTER — Ambulatory Visit (INDEPENDENT_AMBULATORY_CARE_PROVIDER_SITE_OTHER): Payer: PPO | Admitting: Surgical

## 2023-12-30 ENCOUNTER — Encounter: Payer: Self-pay | Admitting: Surgical

## 2023-12-30 DIAGNOSIS — M79641 Pain in right hand: Secondary | ICD-10-CM

## 2023-12-30 NOTE — Progress Notes (Signed)
 Post-Op Visit Note   Patient: Brad Woods           Date of Birth: 1946/11/15           MRN: [id number] Visit Date: 12/30/2023 PCP: Nanci Senior, MD   Assessment & Plan:  Chief Complaint:  Chief Complaint  Patient presents with   Right Shoulder - Follow-up    Reverse shoulder arthroplasty 10/22/2023   Visit Diagnoses:  1. Pain in right hand     Plan: Patient is a 78 year old male who presents s/p right reverse shoulder arthroplasty on 10/22/2023.  States that pain is getting better.  Pleased with how his shoulder is improving.  He is in with physical therapy.  He is mostly concerned about his right hand.  He had nerve study that was done with Dr. Eldonna in early December demonstrating ulnar nerve entrapment at the wrist and returns following right hand/wrist MRI demonstrating some flattening of the median nerve but no abnormality of the ulnar nerve.  All of his symptoms continue to be localized to the small finger and the ulnar border of the palmar aspect of the ring finger.  On exam today, patient has right shoulder with 30 degrees X rotation, 80 degrees abduction, 120 degrees forward elevation passively and actively.  Incision is healing well.  Axillary nerve intact with deltoid firing.  Intact subscap strength.  Right hand with well-healed incision from prior carpal tunnel release.  2+ radial pulse of the right upper extremity.  No hypothenar wasting noted.  There is no significant difference in finger abduction strength.  Intact EPL and FPL.  Does have weakly positive Froment's sign of the right hand but not the left.  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  Plan is further evaluation with Dr. Erwin.  6-week return for final check of his right shoulder with Dr. Addie.  Follow-Up Instructions: No follow-ups on file.   Orders:  Orders Placed This  Encounter  Procedures   Ambulatory referral to Orthopedic Surgery   No orders of the defined types were placed in this encounter.   Imaging: No results found.  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   Morbid obesity (HCC) 07/27/2020   Dizziness 06/22/2020   Exertional dyspnea    Chronic pain of left knee 09/02/2018   Chronic pain of right knee 09/02/2018   History of bilateral knee replacement 09/02/2018   Allergic rhinitis 08/15/2018   Erectile dysfunction 08/15/2018   History of renal stone 08/15/2018   Migraine 08/15/2018   Osteoarthritis 08/15/2018   Hyperlipidemia 08/15/2018   PVD (peripheral vascular disease) (HCC) 08/15/2018   Left shoulder pain 07/30/2018   Chest pain 07/30/2018   Trochanteric bursitis, left hip 05/02/2017   Carpal tunnel syndrome, left upper limb 05/02/2017   Trochanteric bursitis, right hip 03/18/2017   Mixed hyperlipidemia 12/31/2016   Coronary artery disease involving native coronary artery of native heart with angina pectoris (HCC) 11/25/2016   Coronary artery disease involving native coronary artery of native heart without angina pectoris 11/20/2016   Elevated troponin    Unstable angina (HCC) 05/18/2016   Acute coronary syndrome (HCC) 05/18/2016   Benign non-nodular  prostatic hyperplasia with lower urinary tract symptoms 10/12/2014   Essential hypertension 10/12/2014   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 10/12/2014   High risk medication use 10/12/2014   IFG (impaired fasting glucose) 10/12/2014   OSA (obstructive sleep apnea) 02/04/2014   Hypothyroidism 03/02/2013   GERD (gastroesophageal reflux disease) 09/04/2012   Past Medical History:  Diagnosis Date   Allergy    Arthritis    Asthma    when  younger   Coronary artery disease    s/p stenting 10/2016   Dyspnea    GERD (gastroesophageal reflux disease)    Headache    Hyperlipidemia    Hypertension    Kidney stones    Myocardial infarction (HCC)    Pneumonia    hx of   Sleep apnea    cpap    Family History  Problem Relation Age of Onset   Colon cancer Mother 27    Past Surgical History:  Procedure Laterality Date   BICEPT TENODESIS Right 10/22/2023   Procedure: BICEPS TENODESIS;  Surgeon: Addie Cordella Hamilton, MD;  Location: [facility] OR;  Service: Orthopedics;  Laterality: Right;   CARDIAC CATHETERIZATION N/A 05/20/2016   Procedure: Left Heart Cath and Coronary Angiography;  Surgeon: Dorn JINNY Lesches, MD;  Location: [facility] INVASIVE CV LAB;  Service: Cardiovascular;  Laterality: N/A;   CARDIAC CATHETERIZATION N/A 11/22/2016   Procedure: Left Heart Cath and Coronary Angiography;  Surgeon: Lonni Hanson, MD;  Location: [facility] INVASIVE CV LAB;  Service: Cardiovascular;  Laterality: N/A;   CARPAL TUNNEL RELEASE Right    COLONOSCOPY     EYE SURGERY Left    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, 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   INGUINAL HERNIA REPAIR  1955   LEFT HEART CATH AND CORONARY ANGIOGRAPHY N/A 05/31/2020   Procedure: LEFT HEART CATH AND CORONARY ANGIOGRAPHY;  Surgeon: Burnard Debby LABOR, MD;  Location: [facility] INVASIVE CV LAB;  Service: Cardiovascular;  Laterality: N/A;   RECTAL SURGERY     REPLACEMENT TOTAL KNEE Bilateral    REVERSE SHOULDER ARTHROPLASTY Right 10/22/2023   Procedure: RIGHT REVERSE SHOULDER ARTHROPLASTY;  Surgeon: Addie Cordella Hamilton, MD;  Location: [facility];  Service: Orthopedics;  Laterality: Right;   TONSILLECTOMY     Social History   Occupational History   Not on file  Tobacco Use   Smoking status: Never   Smokeless tobacco: Never  Vaping Use   Vaping status: Never Used  Substance and Sexual Activity   Alcohol use: Yes    Comment: maybe 1 beer twice a month   Drug use: No   Sexual activity: Yes     Birth control/protection: None

## 2023-12-31 ENCOUNTER — Telehealth: Payer: Self-pay

## 2023-12-31 ENCOUNTER — Other Ambulatory Visit: Payer: Self-pay

## 2023-12-31 NOTE — Telephone Encounter (Signed)
-----   Message from Burnard Bunting sent at 12/30/2023  5:54 PM EST ----- Symptoms are more ulnar nerve at the wrist by nerve conduction study but no masses present there.  Carpal tunnel by MRI.  This would be a good one for ash to try to sort out.thx

## 2023-12-31 NOTE — Telephone Encounter (Signed)
 I have placed referral.  Did you call patient with results?

## 2024-01-01 ENCOUNTER — Ambulatory Visit: Payer: PPO | Admitting: Pulmonary Disease

## 2024-01-01 ENCOUNTER — Encounter: Payer: Self-pay | Admitting: Surgical

## 2024-01-01 ENCOUNTER — Encounter: Payer: Self-pay | Admitting: Pulmonary Disease

## 2024-01-01 ENCOUNTER — Telehealth: Payer: Self-pay | Admitting: Pulmonary Disease

## 2024-01-01 DIAGNOSIS — Z77090 Contact with and (suspected) exposure to asbestos: Secondary | ICD-10-CM

## 2024-01-01 DIAGNOSIS — J45909 Unspecified asthma, uncomplicated: Secondary | ICD-10-CM

## 2024-01-01 DIAGNOSIS — R0600 Dyspnea, unspecified: Secondary | ICD-10-CM

## 2024-01-01 DIAGNOSIS — G4733 Obstructive sleep apnea (adult) (pediatric): Secondary | ICD-10-CM | POA: Diagnosis not present

## 2024-01-01 NOTE — Patient Instructions (Signed)
 Nice to see you again  I ordered a BiPAP titration in anticipation of the need for a machine with a backup rate given you have mixed central and obstructive sleep apnea  I am going to send a message to Dr. Jude who read your sleep study see if he can help coordinate ongoing sleep apnea care in the future given the complexity of your case  I provided samples of Trelegy, start using this when you are more active.  See if it helps with your shortness of breath with activity etc.  Return to clinic in 3 months or sooner as needed with Dr. Annella

## 2024-01-01 NOTE — Telephone Encounter (Signed)
 Dr. Jude, would you be willing to help coordinating ongoing sleep apnea care for this patient?  You read his sleep study 11/2023.  Severe apnea, mixed central and obstructive.  I have ordered a BiPAP titration study with comments mentioning need for backup rate.  I appreciate your assistance with ongoing sleep apnea needs if willing.  Thank you.  Front desk copied on message to facilitate follow-up visits.

## 2024-01-01 NOTE — Progress Notes (Signed)
 @Patient  ID: Brad Woods, male    DOB: 1946-06-20, 78 y.o.   MRN: [id number]  Chief Complaint  Patient presents with   Follow-up    HST results    Referring provider: Nanci Senior, MD  HPI:   78 y.o. man whom are seen for evaluation of dyspnea exertion.  H&P from recent admission for orthopedic surgery reviewed.  Outpatient orthopedic surgery notes reviewed.  Home sleep study reviewed.  Obtained in interim.  Severe apnea make central and obstructive.  Discussed complexity of case.  Need for likely sleep specialist for ongoing care in the future.  Recommended titration study, I have ordered a BiPAP titration given need for likely backup rate in the setting of central sleep apnea.  He expressed understanding.  Dyspnea stable.  He is not very active.  Not using Trelegy.  Less expensive.  He is not sure while off of it if it is really any worse because he is less active.  We discussed trialing Trelegy once again once he gets to being more active.  Activity limited by recent shoulder surgery orthopedic procedure etc.  HPI at initial visit: He reports issues with dyspnea exertion due to back to childhood.  Noticing a double ammonia at age 66.  In high school he was unable to read try due to dyspnea.  States he could run quickly but only for short distances but then would become became dyspneic chest tightness etc.  Has had dyspnea issues really all throughout his life.  More noticeable over the last 10 years or so he thinks.  No times a day when things are better or worse.  No position to make sense better or worse.  No seasonal environmental factors he can evaluate things better or worse.  He thinks albuterol  when used as needed helps a little bit but for short period of time.  He has been on various ICS/LABA inhalers in the past but does not think it really helped much.  He reports need to get asbestosis exposure with earlier career.  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  He has history of coronary disease.  Most recent echocardiogram 07/2020 with AI, left heart cath 05/2020 with elevated LVEDP at 17 and several noted lesions in patent proximal circumflex to mid circumflex stent noted.  His most recent chest x-ray that I can see results are from 2016 and in the [facility] system, clear lungs without significant pleural disease.  Questionaires / Pulmonary Flowsheets:   ACT:      No data to display          MMRC:     No data to display          Epworth:      No data to display          Tests:   FENO:  No results found for: NITRICOXIDE  PFT:     No data to display          WALK:      No data to display          Imaging: Personally reviewed and as per EMR and discussion in this note MR Wrist Right w/o contrast Result Date: 12/28/2023 CLINICAL DATA:  Right wrist pain for 2 months EXAM: MR OF THE RIGHT WRIST WITHOUT CONTRAST TECHNIQUE: Multiplanar, multisequence MR imaging of the right wrist was performed. No intravenous contrast was administered. COMPARISON:  None Available. FINDINGS: Patient motion degrades image  quality limiting evaluation. Ligaments: Extrinsic ligaments are intact. Scapholunate and lunotriquetral ligaments are intact. Triangular fibrocartilage: Intact Tendons: Mild tendinosis of the extensor carpi ulnaris at the distal ulna. Remainder of the flexor and extensor compartment tendons are intact. Carpal tunnel/median nerve: Mild thickening of the flexor retinaculum. Flattening of the median nerve at the level of the hamate as can be seen with carpal tunnel syndrome. Guyon's canal: Normal Guyon's canal. Normal ulnar nerve. Joint/cartilage: Mild osteoarthritis of the scaphotrapeziotrapezoid joint and first CMC joint. Bones/carpal alignment: No fracture, avascular necrosis, or osseous lesion. Normal alignment. Other: Muscles are normal. No fluid collection, hematoma, or soft  tissue mass. IMPRESSION: 1. Mild tendinosis of the extensor carpi ulnaris at the distal ulna. 2. Mild thickening of the flexor retinaculum and flattening of the median nerve at the level of the hamate as can be seen with carpal tunnel syndrome. Correlate with clinical exam. 3. Mild osteoarthritis of the scaphotrapeziotrapezoid joint and first CMC joint. Electronically Signed   By: Julaine Blanch M.D.   On: 12/28/2023 08:39   NCV with EMG (electromyography) Result Date: 12/03/2023 Eldonna Novel, MD     12/04/2023  8:03 AM EMG & NCV Findings: Evaluation of the right median (across palm) sensory nerve showed prolonged distal peak latency (Wrist, 4.0 ms).  The right ulnar sensory nerve showed reduced amplitude (2.9 V).  All remaining nerves (as indicated in the following tables) were within normal limits.  Needle evaluation of the right first dorsal interosseous muscle showed increased insertional activity, moderately increased spontaneous activity, and diminished recruitment.  All remaining muscles (as indicated in the following table) showed no evidence of electrical instability.  Impression: The above electrodiagnostic study is ABNORMAL but somewhat hard to interpret.  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  This would not fit with his current symptoms however.  **There is however reduced amplitude of the ulnar sensory nerve at the wrist as well as mild needle EMG denervation findings of the FDI musculature.  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  There is no significant electrodiagnostic evidence of any other focal nerve entrapment, brachial plexopathy or cervical radiculopathy. Recommendations: 1.  Follow-up with referring physician. 2.  Continue current management of symptoms.  Consider Imaging evaluation at the wrist and clinical  correlation.  Could monitor to see if this improves with repeat electrodiagnostic study in the next 2 months. ___________________________ Prentice Eldonna [facility] Board Certified, American Board of Physical Medicine and Rehabilitation Nerve Conduction Studies Anti Sensory Summary Table  Stim Site NR Peak (ms) Norm Peak (ms) P-T Amp (V) Norm P-T Amp Site1 Site2 Delta-P (ms) Dist (cm) Vel (m/s) Norm Vel (m/s) Right Median Acr Palm Anti Sensory (2nd Digit)  30.3C Wrist    *4.0 <3.6 18.5 >10 Wrist Palm 2.0 0.0   Palm    2.0 <2.0 20.3        Right Radial Anti Sensory (Base 1st Digit)  29.9C Wrist    2.6 <3.1 17.7  Wrist Base 1st Digit 2.6 0.0   Right Ulnar Anti Sensory (5th Digit)  30.9C Wrist    3.5 <3.7 *2.9 >15.0 Wrist 5th Digit 3.5 14.0 40 >38 Motor Summary Table  Stim Site NR Onset (ms) Norm Onset (ms) O-P Amp (mV) Norm O-P Amp Site1 Site2 Delta-0 (ms) Dist (cm) Vel (m/s) Norm Vel (m/s) Right Median Motor (Abd [employer]  Brev)  30.9C Wrist    3.7 <4.2 7.2 >5 Elbow Wrist 4.5 23.0 51 >50 Elbow    8.2  6.0        Right Ulnar Motor (Abd Dig Min)  31.3C Wrist    3.0 <4.2 5.9 >3 B Elbow Wrist 4.7 25.0 53 >53 B Elbow    7.7  4.4  A Elbow B Elbow 1.2 10.0 83 >53 A Elbow    8.9  3.7        EMG  Side Muscle Nerve Root Ins Act Fibs Psw Amp Dur Poly Recrt Int Bruna Comment Right Abd Poll Brev Median C8-T1 Nml Nml Nml Nml Nml 0 Nml Nml  Right 1stDorInt Ulnar C8-T1 *CRD *2+ *2+ Nml Nml 0 *Reduced Nml  Right PronatorTeres Median C6-7 Nml Nml Nml Nml Nml 0 Nml Nml  Right Biceps Musculocut C5-6 Nml Nml Nml Nml Nml 0 Nml Nml  Right Deltoid Axillary C5-6 Nml Nml Nml Nml Nml 0 Nml Nml  Nerve Conduction Studies Anti Sensory Left/Right Comparison  Stim Site L Lat (ms) R Lat (ms) L-R Lat (ms) L Amp (V) R Amp (V) L-R Amp (%) Site1 Site2 L Vel (m/s) R Vel (m/s) L-R Vel (m/s) Median Acr Palm Anti Sensory (2nd Digit)  30.3C Wrist  *4.0   18.5  Wrist Palm    Palm  2.0   20.3       Radial Anti Sensory (Base 1st Digit)  29.9C Wrist  2.6   17.7   Wrist Base 1st Digit    Ulnar Anti Sensory (5th Digit)  30.9C Wrist  3.5   *2.9  Wrist 5th Digit  40  Motor Left/Right Comparison  Stim Site L Lat (ms) R Lat (ms) L-R Lat (ms) L Amp (mV) R Amp (mV) L-R Amp (%) Site1 Site2 L Vel (m/s) R Vel (m/s) L-R Vel (m/s) Median Motor (Abd Poll Brev)  30.9C Wrist  3.7   7.2  Elbow Wrist  51  Elbow  8.2   6.0       Ulnar Motor (Abd Dig Min)  31.3C Wrist  3.0   5.9  B Elbow Wrist  53  B Elbow  7.7   4.4  A Elbow B Elbow  83  A Elbow  8.9   3.7       Waveforms:         Lab Results: Personally reviewed CBC    Component Value Date/Time   WBC 7.0 10/08/2023 1204   RBC 5.12 10/08/2023 1204   HGB 15.7 10/08/2023 1204   HGB 15.5 05/26/2020 1046   HCT 46.2 10/08/2023 1204   HCT 45.5 05/26/2020 1046   PLT 167 10/08/2023 1204   PLT 164 05/26/2020 1046   MCV 90.2 10/08/2023 1204   MCV 88 05/26/2020 1046   MCH 30.7 10/08/2023 1204   MCHC 34.0 10/08/2023 1204   RDW 13.8 10/08/2023 1204   RDW 13.7 05/26/2020 1046    BMET    Component Value Date/Time   NA 137 10/08/2023 1204   NA 142 07/09/2023 1006   K 4.2 10/08/2023 1204   CL 103 10/08/2023 1204   CO2 25 10/08/2023 1204   GLUCOSE 104 (H) 10/08/2023 1204   BUN 11 10/08/2023 1204   BUN 17 07/09/2023 1006   CREATININE 0.93 10/08/2023 1204   CREATININE 0.96 11/20/2016 1643   CALCIUM  9.7 10/08/2023 1204   GFRNONAA >60 10/08/2023 1204   GFRAA 85 07/27/2020 0935    BNP No results found for:  BNP  ProBNP No results found for: PROBNP  Specialty Problems       Pulmonary Problems   OSA (obstructive sleep apnea)   Overview:  Severe.  On bipap.  Sees Dr. Javaid.      Allergic rhinitis   Exertional dyspnea    Allergies  Allergen Reactions   Hydromorphone Hcl Other (See Comments)    flatlined - Patient states he was overdosed. He states he isn't allergic.   Hydromorphone     Cardiac arrest   Aleve [Naproxen Sodium] Dermatitis    States he was given aleve in the hospital and it caused  welts.     Immunization History  Administered Date(s) Administered   DT (Pediatric) 03/01/2008   Influenza, High Dose Seasonal PF 09/14/2010, 11/01/2015   Influenza, Seasonal, Injecte, Preservative Fre 10/12/2014   Influenza,trivalent, recombinat, inj, PF 09/18/2013   Influenza-Unspecified 09/24/2012   Pfizer(Comirnaty)Fall Seasonal Vaccine 12 years and older 10/02/2023   Pneumococcal Conjugate-13 11/23/2014   Pneumococcal Polysaccharide-23 03/03/2012   Zoster, Live 09/18/2013    Past Medical History:  Diagnosis Date   Allergy    Arthritis    Asthma    when younger   Coronary artery disease    s/p stenting 10/2016   Dyspnea    GERD (gastroesophageal reflux disease)    Headache    Hyperlipidemia    Hypertension    Kidney stones    Myocardial infarction (HCC)    Pneumonia    hx of   Sleep apnea    cpap    Tobacco History: Social History   Tobacco Use  Smoking Status Never  Smokeless Tobacco Never   Counseling given: Not Answered   Continue to not smoke  Outpatient Encounter Medications as of 01/01/2024  Medication Sig   albuterol  (VENTOLIN  HFA) 108 (90 Base) MCG/ACT inhaler Inhale 2 puffs into the lungs every 6 (six) hours as needed for wheezing or shortness of breath.   aspirin  EC 81 MG EC tablet Take 1 tablet (81 mg total) by mouth daily.   carvedilol  (COREG ) 12.5 MG tablet Take 1 tablet (12.5 mg total) by mouth 2 (two) times daily.   clopidogrel  (PLAVIX ) 75 MG tablet Take 1 tablet (75 mg total) by mouth daily.   docusate sodium  (COLACE) 100 MG capsule Take 1 capsule (100 mg total) by mouth 2 (two) times daily.   finasteride  (PROSCAR ) 5 MG tablet Take 5 mg by mouth daily.   fluticasone (FLONASE) 50 MCG/ACT nasal spray Place 2 sprays into the nose daily as needed for allergies.    Fluticasone-Umeclidin-Vilant (TRELEGY ELLIPTA ) 200-62.5-25 MCG/ACT AEPB Inhale 1 puff into the lungs daily.   gabapentin  (NEURONTIN ) 300 MG capsule Take 1 capsule (300 mg total) by  mouth 3 (three) times daily.   isosorbide  mononitrate (IMDUR ) 60 MG 24 hr tablet Take 1 tablet (60 mg total) by mouth daily.   Multiple Vitamins-Minerals (CENTRUM SILVER 50+MEN PO) Take 1 tablet by mouth daily.   nitroGLYCERIN  (NITROSTAT ) 0.4 MG SL tablet Dissolve 1 tablet under the tongue every 5 minutes as needed for chest pain. Max of 3 doses, then 911.   oxyCODONE  (OXY IR/ROXICODONE ) 5 MG immediate release tablet Take 1 tablet (5 mg total) by mouth every 8 (eight) hours as needed for moderate pain (pain score 4-6) (pain score 4-6).   REPATHA  SURECLICK 140 MG/ML SOAJ INJECT ONE pen into THE SKIN every 14 DAYS   Semaglutide, 1 MG/DOSE, (OZEMPIC, 1 MG/DOSE,) 2 MG/1.5ML SOPN Inject 2 mg into the skin once a  week.   tamsulosin  (FLOMAX ) 0.4 MG CAPS capsule Take 0.4 mg by mouth at bedtime.    No facility-administered encounter medications on file as of 01/01/2024.     Review of Systems  Review of Systems  N/a Physical Exam  BP 114/80 (BP Location: Left Arm, Patient Position: Sitting, Cuff Size: Large)   Pulse 75   Ht 6' 1 (1.854 m)   Wt 282 lb 12.8 oz (128.3 kg)   SpO2 97%   BMI 37.31 kg/m   Wt Readings from Last 5 Encounters:  01/01/24 282 lb 12.8 oz (128.3 kg)  10/22/23 275 lb (124.7 kg)  10/08/23 274 lb 3.2 oz (124.4 kg)  10/03/23 276 lb 9.6 oz (125.5 kg)  10/01/23 273 lb 9.6 oz (124.1 kg)    BMI Readings from Last 5 Encounters:  01/01/24 37.31 kg/m  10/22/23 36.28 kg/m  10/08/23 35.21 kg/m  10/03/23 35.51 kg/m  10/01/23 35.13 kg/m     Physical Exam General: Sitting in chair, no Eyes: EOMI, no icterus Neck: Supple, no JVP Cardiovascular: Regular and rhythm, no murmur Pulmonary: Clear, distant, normal work of breathing on room air Abdomen: Nondistended bowel sounds present MSK: No synovitis, no joint effusion Neuro: Normal gait, weakness Psych: Normal mood, full affect   Assessment & Plan:   Dyspnea on exertion: Chronic issue.  Even started in high school.   Present for several years as an adult now.  Prior cardiac workup indicated elevated LVEDP of 17 on left heart catheterization 05/2020.  He has CAD and has stenting.  TTE 07/2020 revealed aortic insufficiency.  Quite possible cardiac contributors.  In addition, given his shortness of breath described in high school, high suspicion for asthma.  Has been diagnosed with this in the past and treated in the past.  Does not feel like inhalers help much.  Significant asbestosis exposure possible development of restrictive lung disease.  Chest x-ray 09/2023 clear.  PFTs for further evaluation ordered but yet to be obtained.  Dyspnea not much of an issue but he admits to being less active.  Trialing inhalers as below.  Asthma: Diagnosed in the past.  ICS/LABA inhaler is not very helpful.  Start high-dose Trelegy 1 puff daily once he increases activity after recent orthopedic surgeries.  Recommend albuterol  use prior to exertion.  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  Chest x-ray 09/2023 clear.  Consider CT scans in the future.  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  BiPAP titration with request for backup rate placed today.  Will message sleep doctor to see if they can help coordinate ongoing sleep apnea care  Return in about 3 months (around 03/31/2024) for f/u Dr. Annella.   Donnice JONELLE Annella, MD 01/01/2024

## 2024-01-01 NOTE — Telephone Encounter (Signed)
 Brad Woods saw thanks

## 2024-01-07 ENCOUNTER — Other Ambulatory Visit (INDEPENDENT_AMBULATORY_CARE_PROVIDER_SITE_OTHER): Payer: PPO

## 2024-01-07 ENCOUNTER — Ambulatory Visit: Payer: PPO | Admitting: Orthopedic Surgery

## 2024-01-07 DIAGNOSIS — G5601 Carpal tunnel syndrome, right upper limb: Secondary | ICD-10-CM

## 2024-01-07 DIAGNOSIS — G5621 Lesion of ulnar nerve, right upper limb: Secondary | ICD-10-CM

## 2024-01-07 NOTE — Progress Notes (Signed)
 Jayven Naill Clinkenbeard - 78 y.o. male MRN [id number]  Date of birth: July 19, 1946  Office Visit Note: Visit Date: 01/07/2024 PCP: Nanci Senior, MD Referred by: Shirly Carlin CROME, PA-C  Subjective: No chief complaint on file.  HPI: SHAD LEDVINA is a pleasant 78 y.o. male who presents today for evaluation of ongoing numbness and tingling in the ulnar-sided digits of the right hand with an associated small finger trigger digit.  Trigger digit has been present now for greater than 1 year without any significant intervention or treatment.  The numbness in the hand has been present now since approximately October, when he underwent a shoulder surgery with Dr. Addie.  He has undergone electrodiagnostic study which showed some reduced amplitude of the ulnar sensory nerve at the wrist as well as mild needle EMG denervation findings of the FDI musculature.  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  Pertinent ROS were reviewed with the patient and found to be negative unless otherwise specified above in HPI.   Visit Reason: right small finger trigger, numbness in the ulnar nerve distribution Duration of symptoms: October 2024 Hand dominance: right Occupation: Retired Diabetic: No Smoking: No Heart/Lung History: hx of heart attack Blood Thinners: plavix    Prior Testing/EMG: EMG 12/03/23, MRI   Injections (Date): none Treatments: gabapentin  Prior Surgery: none  Assessment & Plan: Visit Diagnoses:  1. Carpal tunnel syndrome, right upper limb   2. Cubital tunnel syndrome on right     Plan: Extensive discussion was had with the patient today regarding his multiple right upper extremity complaints.  As for the right small finger trigger digit, we discussed the etiology and pathophysiology of stenosing tenosynovitis.  We discussed conservative versus surgical treatment modalities.  From a conservative standpoint, we discussed activity modification, splinting, therapy and injections.   From a surgical standpoint, we discussed the possibility for trigger digit release as well as all risk and benefits associated.  Given that the has not trialed conservative treatments, patient is appropriate candidate for cortisone injection to the right small finger A1 pulley for symptom relief.  Risks and benefit of the cortisone injection were discussed in detail, patient agreed to proceed.  Injection was provided today without issue.  As for the numbness in the ulnar nerve distribution of the hand, I reviewed in detail his electrodiagnostic study which does seem to suggest potential ulnar tunnel syndrome at the level of the wrist.  His velocity across the elbow is appropriate, he does not have any clinical signs and symptoms of cubital tunnel syndrome currently.  Clinically, there is weakness in the FDI musculature which is concerning.  For diagnostic and therapeutic purposes, I recommend we do a cortisone injection today to the right ulnar tunnel in order to better delineate specific pathology in this region.  I explained that this might help us  determine if a Guyon's canal release could be helpful in the future for him.  He does have a history significant for prior extended open carpal tunnel release done years prior, meaning that there would be a significant amount of scar tissue in this region.  Patient expressed understanding of this plan.  Injections were provided today to the right small finger A1 pulley as well as the right wrist ulnar tunnel.  He will return in approximately 6 weeks time for recheck.   Follow-up: No follow-ups on file.   Meds & Orders: No orders of the defined types were placed in this encounter.   Orders Placed This  Encounter  Procedures   XR Wrist Complete Right   XR Elbow 2 Views Right     Procedures: No procedures performed      Clinical History: No specialty comments available.  He reports that he has never smoked. He has never used smokeless tobacco. No  results for input(s): HGBA1C, LABURIC in the last 8760 hours.  Objective:   Vital Signs: There were no vitals taken for this visit.  Physical Exam  Gen: Well-appearing, in no acute distress; non-toxic CV: Regular Rate. Well-perfused. Warm.  Resp: Breathing unlabored on room air; no wheezing. Psych: Fluid speech in conversation; appropriate affect; normal thought process  Ortho Exam PHYSICAL EXAM:  General: Patient is well appearing and in no distress. Cervical spine mobility is full in all directions:  Skin and Muscle: Prior right wrist carpal tunnel incision extends from the palmar aspect into the forearm, well-healed.  Muscle bulk and contour there is evidence of mild FDI atrophy of the right hand.  Range of Motion and Palpation Tests: Mobility is full about the elbows with flexion and extension.  Forearm supination and pronation are 85/85 bilaterally.  Wrist flexion/extension is 75/65 bilaterally.  Digital flexion and extension are full.  Thumb opposition is full to the base of the small fingers bilaterally.    Palpable nodule at the right small finger A1 pulley with associated tenderness.  Notable clicking of the small finger with active flexion on examination today.   Neurologic, Vascular, Motor: Sensation is diminished to light touch in the volar ulnar distribution of the hand right side.   Tinel's testing positive at the right Guyon's canal Phalen's negative bilaterally, Derkan's compression negative bilaterally  Fingers pink and well perfused.  Capillary refill is brisk.      No results found for: HGBA1C   Imaging: XR Elbow 2 Views Right Result Date: 01/07/2024 X-rays of the right elbow, multiple views were obtained today X-rays demonstrate stable appearance of the ulnohumeral and radiocapitellar articulations without significant abnormalities.  Bone realization is appropriate.  XR Wrist Complete Right Result Date: 01/07/2024 X-rays of the right wrist,  multiple views were conducted today X-rays demonstrate stable appearance of the radiocarpal and midcarpal articulations without significant abnormalities.  Bone mineralization is appropriate.   Past Medical/Family/Surgical/Social History: Medications & Allergies reviewed per EMR, new medications updated. 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   Morbid obesity (HCC) 07/27/2020   Dizziness 06/22/2020   Exertional dyspnea    Chronic pain of left knee 09/02/2018   Chronic pain of right knee 09/02/2018   History of bilateral knee replacement 09/02/2018   Allergic rhinitis 08/15/2018   Erectile dysfunction 08/15/2018   History of renal stone 08/15/2018   Migraine 08/15/2018   Osteoarthritis 08/15/2018   Hyperlipidemia 08/15/2018   PVD (peripheral vascular disease) (HCC) 08/15/2018   Left shoulder pain 07/30/2018   Chest pain 07/30/2018   Trochanteric bursitis, left hip 05/02/2017   Carpal tunnel syndrome, left upper limb 05/02/2017   Trochanteric bursitis, right hip 03/18/2017   Mixed hyperlipidemia 12/31/2016   Coronary artery disease involving native coronary artery of native heart with angina pectoris (HCC) 11/25/2016   Coronary artery disease involving native coronary artery of native heart without angina pectoris 11/20/2016   Elevated troponin    Unstable angina (HCC) 05/18/2016  Acute coronary syndrome (HCC) 05/18/2016   Benign non-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 10/12/2014   Essential hypertension 10/12/2014   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 10/12/2014   High risk medication use 10/12/2014   IFG (impaired fasting glucose) 10/12/2014   OSA (obstructive sleep apnea) 02/04/2014   Hypothyroidism 03/02/2013   GERD  (gastroesophageal reflux disease) 09/04/2012   Past Medical History:  Diagnosis Date   Allergy    Arthritis    Asthma    when younger   Coronary artery disease    s/p stenting 10/2016   Dyspnea    GERD (gastroesophageal reflux disease)    Headache    Hyperlipidemia    Hypertension    Kidney stones    Myocardial infarction (HCC)    Pneumonia    hx of   Sleep apnea    cpap   Family History  Problem Relation Age of Onset   Colon cancer Mother 82   Past Surgical History:  Procedure Laterality Date   BICEPT TENODESIS Right 10/22/2023   Procedure: BICEPS TENODESIS;  Surgeon: Addie Cordella Hamilton, MD;  Location: [facility] OR;  Service: Orthopedics;  Laterality: Right;   CARDIAC CATHETERIZATION N/A 05/20/2016   Procedure: Left Heart Cath and Coronary Angiography;  Surgeon: Dorn JINNY Lesches, MD;  Location: [facility] INVASIVE CV LAB;  Service: Cardiovascular;  Laterality: N/A;   CARDIAC CATHETERIZATION N/A 11/22/2016   Procedure: Left Heart Cath and Coronary Angiography;  Surgeon: Lonni Hanson, MD;  Location: [facility] INVASIVE CV LAB;  Service: Cardiovascular;  Laterality: N/A;   CARPAL TUNNEL RELEASE Right    COLONOSCOPY     EYE SURGERY Left    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, 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   INGUINAL HERNIA REPAIR  1955   LEFT HEART CATH AND CORONARY ANGIOGRAPHY N/A 05/31/2020   Procedure: LEFT HEART CATH AND CORONARY ANGIOGRAPHY;  Surgeon: Burnard Debby LABOR, MD;  Location: [facility] INVASIVE CV LAB;  Service: Cardiovascular;  Laterality: N/A;   RECTAL SURGERY     REPLACEMENT TOTAL KNEE Bilateral    REVERSE SHOULDER ARTHROPLASTY Right 10/22/2023   Procedure: RIGHT REVERSE SHOULDER ARTHROPLASTY;  Surgeon: Addie Cordella Hamilton, MD;  Location: [facility] OR;  Service: Orthopedics;  Laterality: Right;   TONSILLECTOMY     Social History   Occupational History   Not on file  Tobacco Use   Smoking status: Never   Smokeless tobacco: Never  Vaping Use   Vaping status: Never Used   Substance and Sexual Activity   Alcohol use: Yes    Comment: maybe 1 beer twice a month   Drug use: No   Sexual activity: Yes    Birth control/protection: None    Danely Bayliss Afton Alderton, M.D. [facility] 1:47 PM

## 2024-02-04 ENCOUNTER — Telehealth: Payer: Self-pay | Admitting: Pulmonary Disease

## 2024-02-04 NOTE — Telephone Encounter (Signed)
PT called and cancelled appt   States he does not feel ready to go through with it   Did advise him to give me a call back if he wants to resch

## 2024-02-10 ENCOUNTER — Encounter: Payer: Self-pay | Admitting: Orthopedic Surgery

## 2024-02-10 ENCOUNTER — Ambulatory Visit (INDEPENDENT_AMBULATORY_CARE_PROVIDER_SITE_OTHER): Payer: PPO | Admitting: Orthopedic Surgery

## 2024-02-10 DIAGNOSIS — Z96611 Presence of right artificial shoulder joint: Secondary | ICD-10-CM

## 2024-02-10 NOTE — Progress Notes (Signed)
 Post-Op Visit Note   Patient: Brad Woods           Date of Birth: April 12, 1946           MRN: [id number] Visit Date: 02/10/2024 PCP: Joycelyn Rua, MD   Assessment & Plan:  Chief Complaint:  Chief Complaint  Patient presents with   Right Shoulder - Routine Post Op   Visit Diagnoses:  1. Status post reverse arthroplasty of right shoulder     Plan: Brad Woods is now about 3 and half months out right reverse shoulder replacement.  He has been doing well.  Still has a little bit of numbness in his ring and small finger.  Had an injection which did not help.  On exam is got range of motion of the shoulder of 50/100/160.  Good subscap strength as well as external rotation strength.  Does have 5 out of 5 interosseous strength bilaterally as well.  Plan at this time is he is doing well from a shoulder perspective.  Unclear etiology of his ulnar symptoms which appear to be coming from the wrist based on the EMG nerve study.  We will defer to Dr. Fara Boros for further management of that problem.  He will follow-up with [place] as needed.  Follow-Up Instructions: No follow-ups on file.   Orders:  No orders of the defined types were placed in this encounter.  No orders of the defined types were placed in this encounter.   Imaging: No results found.  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   Morbid obesity (HCC) 07/27/2020   Dizziness 06/22/2020   Exertional dyspnea    Chronic pain of left knee 09/02/2018   Chronic pain of right knee 09/02/2018   History of bilateral knee replacement 09/02/2018   Allergic rhinitis 08/15/2018   Erectile dysfunction 08/15/2018   History of renal stone 08/15/2018   Migraine 08/15/2018   Osteoarthritis 08/15/2018    Hyperlipidemia 08/15/2018   PVD (peripheral vascular disease) (HCC) 08/15/2018   Left shoulder pain 07/30/2018   Chest pain 07/30/2018   Trochanteric bursitis, left hip 05/02/2017   Carpal tunnel syndrome, left upper limb 05/02/2017   Trochanteric bursitis, right hip 03/18/2017   Mixed hyperlipidemia 12/31/2016   Coronary artery disease involving native coronary artery of native heart with angina pectoris (HCC) 11/25/2016   Coronary artery disease involving native coronary artery of native heart without angina pectoris 11/20/2016   Elevated troponin    Unstable angina (HCC) 05/18/2016   Acute coronary syndrome (HCC) 05/18/2016   Benign non-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 10/12/2014   Essential hypertension 10/12/2014   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 10/12/2014   High risk medication use 10/12/2014   IFG (impaired fasting glucose) 10/12/2014   OSA (obstructive sleep apnea) 02/04/2014   Hypothyroidism 03/02/2013   GERD (gastroesophageal reflux disease) 09/04/2012   Past Medical History:  Diagnosis Date   Allergy    Arthritis    Asthma    when younger   Coronary artery disease    s/p stenting 10/2016   Dyspnea    GERD (gastroesophageal reflux disease)    Headache    Hyperlipidemia    Hypertension    Kidney stones    Myocardial infarction (HCC)    Pneumonia  hx of   Sleep apnea    cpap    Family History  Problem Relation Age of Onset   Colon cancer Mother 80    Past Surgical History:  Procedure Laterality Date   BICEPT TENODESIS Right 10/22/2023   Procedure: BICEPS TENODESIS;  Surgeon: Cammy Copa, MD;  Location: [facility] OR;  Service: Orthopedics;  Laterality: Right;   CARDIAC CATHETERIZATION N/A 05/20/2016   Procedure: Left Heart Cath and Coronary Angiography;  Surgeon: Runell Gess, MD;  Location: [facility] INVASIVE CV LAB;  Service: Cardiovascular;  Laterality: N/A;   CARDIAC CATHETERIZATION N/A 11/22/2016   Procedure: Left Heart Cath  and Coronary Angiography;  Surgeon: Yvonne Kendall, MD;  Location: [facility] INVASIVE CV LAB;  Service: Cardiovascular;  Laterality: N/A;   CARPAL TUNNEL RELEASE Right    COLONOSCOPY     EYE SURGERY Left    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, 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   INGUINAL HERNIA REPAIR  1955   LEFT HEART CATH AND CORONARY ANGIOGRAPHY N/A 05/31/2020   Procedure: LEFT HEART CATH AND CORONARY ANGIOGRAPHY;  Surgeon: Lennette Bihari, MD;  Location: [facility] INVASIVE CV LAB;  Service: Cardiovascular;  Laterality: N/A;   RECTAL SURGERY     REPLACEMENT TOTAL KNEE Bilateral    REVERSE SHOULDER ARTHROPLASTY Right 10/22/2023   Procedure: RIGHT REVERSE SHOULDER ARTHROPLASTY;  Surgeon: Cammy Copa, MD;  Location: [facility] OR;  Service: Orthopedics;  Laterality: Right;   TONSILLECTOMY     Social History   Occupational History   Not on file  Tobacco Use   Smoking status: Never   Smokeless tobacco: Never  Vaping Use   Vaping status: Never Used  Substance and Sexual Activity   Alcohol use: Yes    Comment: maybe 1 beer twice a month   Drug use: No   Sexual activity: Yes    Birth control/protection: None

## 2024-02-12 ENCOUNTER — Ambulatory Visit: Payer: PPO | Admitting: Orthopedic Surgery

## 2024-02-12 DIAGNOSIS — M65351 Trigger finger, right little finger: Secondary | ICD-10-CM

## 2024-02-12 DIAGNOSIS — G5621 Lesion of ulnar nerve, right upper limb: Secondary | ICD-10-CM | POA: Diagnosis not present

## 2024-02-12 NOTE — Progress Notes (Signed)
 Brad Woods - 78 y.o. male MRN [id number]  Date of birth: 12-31-1945  Office Visit Note: Visit Date: 02/12/2024 PCP: Joycelyn Rua, MD Referred by: Joycelyn Rua, MD  Subjective: No chief complaint on file.  HPI: Brad Woods is a pleasant 78 y.o. male who presents today for evaluation of ongoing numbness and tingling in the ulnar-sided digits of the right hand with an associated small finger trigger digit.  Trigger digit has been present now for greater than 1 year without any significant intervention or treatment.  The numbness in the hand has been present now since approximately October, when he underwent a shoulder surgery with Dr. August Saucer.  He has undergone electrodiagnostic study which showed some reduced amplitude of the ulnar sensory nerve at the wrist as well as mild needle EMG denervation findings of the FDI musculature.  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  At his most recent visit with me in mid January, we performed diagnostic and therapeutic cortisone injection to the right wrist ulnar tunnel as well as the small finger A1 pulley.  He states that his symptoms have persisted despite recent injection.  He did experience some temporary relief from both injections with recurrence of symptoms.  Pertinent ROS were reviewed with the patient and found to be negative unless otherwise specified above in HPI.    Assessment & Plan: Visit Diagnoses:  1. Ulnar tunnel syndrome of right wrist   2. Trigger little finger of right hand      Plan: Extensive discussion was had with the patient today regarding his multiple right upper extremity complaints.  As for the right small finger trigger digit, we discussed the etiology and pathophysiology of stenosing tenosynovitis.  We discussed conservative versus surgical treatment modalities.  From a conservative standpoint, we discussed activity modification, splinting, therapy and injections.  From a surgical standpoint,  we discussed the possibility for trigger digit release as well as all risk and benefits associated.  Given that the has not trialed conservative treatments, patient is appropriate candidate for cortisone injection to the right small finger A1 pulley for symptom relief.  Risks and benefit of the cortisone injection were discussed in detail, patient agreed to proceed.  Injection was provided today without issue.  As for the numbness in the ulnar nerve distribution of the hand, I reviewed in detail his electrodiagnostic study which does seem to suggest potential ulnar tunnel syndrome at the level of the wrist.  His velocity across the elbow is appropriate, he does not have any clinical signs and symptoms of cubital tunnel syndrome currently.  Clinically, there is weakness in the FDI musculature which is concerning.    He does have a history significant for prior extended open carpal tunnel release done years prior, meaning that there would be a significant amount of scar tissue in this region.  Patient expressed understanding of this plan.  Given his symptoms ongoing despite conservative care and with his clinical workup, he is indicated for right wrist ulnar tunnel release Guyon's canal with associated small finger trigger digit release.  Risks and benefits of the procedure were discussed, risks including but not limited to infection, bleeding, scarring, stiffness, nerve injury, tendon injury, vascular injury, persistence of symptoms, recurrence of symptoms and need for subsequent operation.  Patient expressed understanding.  We will move forward with surgical scheduling at the patient's preference, have recommended that we wait until early April given his recent injection to both the ulnar tunnel and small  finger A1 pulley from January.    Follow-up: No follow-ups on file.   Meds & Orders: No orders of the defined types were placed in this encounter.   No orders of the defined types were placed in this  encounter.    Procedures: No procedures performed      Clinical History: No specialty comments available.  He reports that he has never smoked. He has never used smokeless tobacco. No results for input(s): "HGBA1C", "LABURIC" in the last 8760 hours.  Objective:   Vital Signs: There were no vitals taken for this visit.  Physical Exam  Gen: Well-appearing, in no acute distress; non-toxic CV: Regular Rate. Well-perfused. Warm.  Resp: Breathing unlabored on room air; no wheezing. Psych: Fluid speech in conversation; appropriate affect; normal thought process  Ortho Exam PHYSICAL EXAM:  General: Patient is well appearing and in no distress. Cervical spine mobility is full in all directions:  Skin and Muscle: Prior right wrist carpal tunnel incision extends from the palmar aspect into the forearm, well-healed.  Muscle bulk and contour there is evidence of mild FDI atrophy of the right hand.  Range of Motion and Palpation Tests: Mobility is full about the elbows with flexion and extension.  Forearm supination and pronation are 85/85 bilaterally.  Wrist flexion/extension is 75/65 bilaterally.  Digital flexion and extension are full.  Thumb opposition is full to the base of the small fingers bilaterally.    Palpable nodule at the right small finger A1 pulley with associated tenderness.  Notable clicking of the small finger with active flexion on examination today.   Neurologic, Vascular, Motor: Sensation is diminished to light touch in the volar ulnar distribution of the hand right side.   Tinel's testing positive at the right Guyon's canal Phalen's negative bilaterally, Derkan's compression negative bilaterally Mild FDI wasting, positive Froment's sign  Fingers pink and well perfused.  Capillary refill is brisk.      No results found for: "HGBA1C"   Imaging: No results found.   Past Medical/Family/Surgical/Social History: Medications & Allergies reviewed per EMR, new  medications updated. 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   Morbid obesity (HCC) 07/27/2020   Dizziness 06/22/2020   Exertional dyspnea    Chronic pain of left knee 09/02/2018   Chronic pain of right knee 09/02/2018   History of bilateral knee replacement 09/02/2018   Allergic rhinitis 08/15/2018   Erectile dysfunction 08/15/2018   History of renal stone 08/15/2018   Migraine 08/15/2018   Osteoarthritis 08/15/2018   Hyperlipidemia 08/15/2018   PVD (peripheral vascular disease) (HCC) 08/15/2018   Left shoulder pain 07/30/2018   Chest pain 07/30/2018   Trochanteric bursitis, left hip 05/02/2017   Carpal tunnel syndrome, left upper limb 05/02/2017   Trochanteric bursitis, right hip 03/18/2017   Mixed hyperlipidemia 12/31/2016   Coronary artery disease involving native coronary artery of native heart with angina pectoris (HCC) 11/25/2016   Coronary artery disease involving native coronary artery of native heart without angina pectoris 11/20/2016   Elevated troponin    Unstable angina (HCC) 05/18/2016   Acute coronary syndrome (HCC) 05/18/2016   Benign non-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 10/12/2014   Essential hypertension 10/12/2014   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 10/12/2014   High risk medication use 10/12/2014  IFG (impaired fasting glucose) 10/12/2014   OSA (obstructive sleep apnea) 02/04/2014   Hypothyroidism 03/02/2013   GERD (gastroesophageal reflux disease) 09/04/2012   Past Medical History:  Diagnosis Date   Allergy    Arthritis    Asthma    when younger   Coronary artery disease    s/p stenting 10/2016   Dyspnea    GERD (gastroesophageal reflux disease)    Headache    Hyperlipidemia    Hypertension     Kidney stones    Myocardial infarction (HCC)    Pneumonia    hx of   Sleep apnea    cpap   Family History  Problem Relation Age of Onset   Colon cancer Mother 30   Past Surgical History:  Procedure Laterality Date   BICEPT TENODESIS Right 10/22/2023   Procedure: BICEPS TENODESIS;  Surgeon: Cammy Copa, MD;  Location[facility];  Service: Orthopedics;  Laterality: Right;   CARDIAC CATHETERIZATION N/A 05/20/2016   Procedure: Left Heart Cath and Coronary Angiography;  Surgeon: Runell Gess, MD;  Location: [facility] INVASIVE CV LAB;  Service: Cardiovascular;  Laterality: N/A;   CARDIAC CATHETERIZATION N/A 11/22/2016   Procedure: Left Heart Cath and Coronary Angiography;  Surgeon: Yvonne Kendall, MD;  Location: [facility] INVASIVE CV LAB;  Service: Cardiovascular;  Laterality: N/A;   CARPAL TUNNEL RELEASE Right    COLONOSCOPY     EYE SURGERY Left    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, 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   INGUINAL HERNIA REPAIR  1955   LEFT HEART CATH AND CORONARY ANGIOGRAPHY N/A 05/31/2020   Procedure: LEFT HEART CATH AND CORONARY ANGIOGRAPHY;  Surgeon: Lennette Bihari, MD;  Location: [facility] INVASIVE CV LAB;  Service: Cardiovascular;  Laterality: N/A;   RECTAL SURGERY     REPLACEMENT TOTAL KNEE Bilateral    REVERSE SHOULDER ARTHROPLASTY Right 10/22/2023   Procedure: RIGHT REVERSE SHOULDER ARTHROPLASTY;  Surgeon: Cammy Copa, MD;  Location: [facility] OR;  Service: Orthopedics;  Laterality: Right;   TONSILLECTOMY     Social History   Occupational History   Not on file  Tobacco Use   Smoking status: Never   Smokeless tobacco: Never  Vaping Use   Vaping status: Never Used  Substance and Sexual Activity   Alcohol use: Yes    Comment: maybe 1 beer twice a month   Drug use: No   Sexual activity: Yes    Birth control/protection: None    Audelia Knape Trevor Mace, M.D. [facility]

## 2024-02-14 ENCOUNTER — Encounter (HOSPITAL_COMMUNITY): Payer: Self-pay

## 2024-02-14 ENCOUNTER — Emergency Department (HOSPITAL_COMMUNITY)
Admission: EM | Admit: 2024-02-14 | Discharge: 2024-02-15 | Disposition: A | Discharge status: Home / Self Care | Payer: PPO | Attending: Emergency Medicine | Admitting: Emergency Medicine

## 2024-02-14 ENCOUNTER — Other Ambulatory Visit: Payer: Self-pay

## 2024-02-14 ENCOUNTER — Emergency Department (HOSPITAL_COMMUNITY): Payer: PPO

## 2024-02-14 DIAGNOSIS — R1011 Right upper quadrant pain: Secondary | ICD-10-CM | POA: Diagnosis present

## 2024-02-14 DIAGNOSIS — K8020 Calculus of gallbladder without cholecystitis without obstruction: Secondary | ICD-10-CM | POA: Insufficient documentation

## 2024-02-14 LAB — CBC WITH DIFFERENTIAL/PLATELET
Abs Immature Granulocytes: 0.03 10*3/uL (ref 0.00–0.07)
Basophils Absolute: 0 10*3/uL (ref 0.0–0.1)
Basophils Relative: 0 %
Eosinophils Absolute: 0.1 10*3/uL (ref 0.0–0.5)
Eosinophils Relative: 1 %
HCT: 47.5 % (ref 39.0–52.0)
Hemoglobin: 16.4 g/dL (ref 13.0–17.0)
Immature Granulocytes: 0 %
Lymphocytes Relative: 14 %
Lymphs Abs: 1.4 10*3/uL (ref 0.7–4.0)
MCH: 30.1 pg (ref 26.0–34.0)
MCHC: 34.5 g/dL (ref 30.0–36.0)
MCV: 87.2 fL (ref 80.0–100.0)
Monocytes Absolute: 0.6 10*3/uL (ref 0.1–1.0)
Monocytes Relative: 6 %
Neutro Abs: 7.6 10*3/uL (ref 1.7–7.7)
Neutrophils Relative %: 79 %
Platelets: 193 10*3/uL (ref 150–400)
RBC: 5.45 MIL/uL (ref 4.22–5.81)
RDW: 14.2 % (ref 11.5–15.5)
WBC: 9.7 10*3/uL (ref 4.0–10.5)
nRBC: 0 % (ref 0.0–0.2)

## 2024-02-14 LAB — COMPREHENSIVE METABOLIC PANEL
ALT: 48 U/L — ABNORMAL HIGH (ref 0–44)
AST: 56 U/L — ABNORMAL HIGH (ref 15–41)
Albumin: 4 g/dL (ref 3.5–5.0)
Alkaline Phosphatase: 73 U/L (ref 38–126)
Anion gap: 9 (ref 5–15)
BUN: 18 mg/dL (ref 8–23)
CO2: 27 mmol/L (ref 22–32)
Calcium: 9.8 mg/dL (ref 8.9–10.3)
Chloride: 101 mmol/L (ref 98–111)
Creatinine, Ser: 0.99 mg/dL (ref 0.61–1.24)
GFR, Estimated: >60 mL/min (ref >60)
Glucose, Bld: 102 mg/dL — ABNORMAL HIGH (ref 70–99)
Potassium: 4.5 mmol/L (ref 3.5–5.1)
Sodium: 137 mmol/L (ref 135–145)
Total Bilirubin: 1.2 mg/dL (ref 0.0–1.2)
Total Protein: 7.2 g/dL (ref 6.5–8.1)

## 2024-02-14 LAB — TROPONIN I (HIGH SENSITIVITY): Troponin I (High Sensitivity): 5 ng/L (ref <18)

## 2024-02-14 LAB — LIPASE, BLOOD: Lipase: 41 U/L (ref 11–51)

## 2024-02-14 NOTE — ED Triage Notes (Addendum)
 Patient reports back pain, a little chest pain in the front directly in front of where the back hurts and it hurts when he pushes on it. Patient did not try any meds at home and UC sent him here d/t "abnormal" EKG. Patient reports it feels more like kidney stones that he's had in the past.

## 2024-02-14 NOTE — ED Provider Triage Note (Signed)
 Emergency Medicine Provider Triage Evaluation Note  Brad Woods , a 78 y.o. male  was evaluated in triage.  Pt complains of lower chest pain with right upper quadrant pain that began around 5:00 today.  Patient does not take any medications.  Patient has gallbladder.  Patient dates that the rubber quadrant pain radiates around to his back.  Patient does endorse some nausea.  Patient states that this pain does not radiate.  But states he feels that he is having chest pain however this feels different than his previous heart attacks.  Patient denies any shortness of breath or leg swelling.  Review of Systems  Positive:  Negative:   Physical Exam  BP 138/85 (BP Location: Right Arm)   Pulse 80   Temp 98.2 F (36.8 C) (Oral)   Resp 18   SpO2 100%  Gen:   Awake, no distress   Resp:  Normal effort  MSK:   Moves extremities without difficulty  Other:  Right upper quadrant tenderness without peritoneal signs  Medical Decision Making  Medically screening exam initiated at 9:07 PM.  Appropriate orders placed.  Brad Woods was informed that the remainder of the evaluation will be completed by another provider, this initial triage assessment does not replace that evaluation, and the importance of remaining in the ED until their evaluation is complete.  Workup initiated, favor patient's gallbladder causing his abdominal pain and chest pain and so we will get ultrasound and obtain labs.  Patient stable.   Netta Corrigan, [place] 02/14/24 2108

## 2024-02-15 LAB — URINALYSIS, ROUTINE W REFLEX MICROSCOPIC
Bilirubin Urine: NEGATIVE
Glucose, UA: NEGATIVE mg/dL
Hgb urine dipstick: NEGATIVE
Ketones, ur: NEGATIVE mg/dL
Leukocytes,Ua: NEGATIVE
Nitrite: NEGATIVE
Protein, ur: NEGATIVE mg/dL
Specific Gravity, Urine: 1.017 (ref 1.005–1.030)
pH: 5 (ref 5.0–8.0)

## 2024-02-15 LAB — TROPONIN I (HIGH SENSITIVITY): Troponin I (High Sensitivity): 5 ng/L (ref <18)

## 2024-02-15 MED ORDER — ONDANSETRON 4 MG PO TBDP: 4.0000 mg | ORAL_TABLET | Freq: Three times a day (TID) | ORAL | 0 refills | Status: DC | PRN

## 2024-02-15 MED ORDER — OXYCODONE-ACETAMINOPHEN 5-325 MG PO TABS: 1.0000 | ORAL_TABLET | Freq: Four times a day (QID) | ORAL | 0 refills | Status: DC | PRN

## 2024-02-15 NOTE — ED Provider Notes (Cosign Needed Addendum)
 [facility]PT [facility] Emergency Department Provider Note MRN:  [id number]  Arrival date & time: 02/15/24     Chief Complaint   Chest Pain and Back Pain   History of Present Illness   Brad Woods is a 78 y.o. year-old male presents to the ED with chief complaint of right upper quadrant abdominal pain that radiates into the back.  Symptoms started this afternoon.  He states that he did feel nauseated, but did not have any vomiting.  States that his symptoms have now completely resolved.  He denies having had fevers or chills.  Denies any treatments prior to arrival.  He states that he has had history of prior MI as well as prior kidney stones and was initially concerned about these, but states that his symptoms felt a bit different.  History provided by patient.   Review of Systems  Pertinent positive and negative review of systems noted in HPI.    Physical Exam   Vitals:   02/14/24 2047 02/15/24 0337  BP: 138/85 (!) 145/83  Pulse: 80 70  Resp: 18 18  Temp: 98.2 F (36.8 C) 97.8 F (36.6 C)  SpO2: 100% 98%    CONSTITUTIONAL:  non toxic-appearing, NAD NEURO:  Alert and oriented x 3, CN 3-12 grossly intact EYES:  eyes equal and reactive ENT/NECK:  Supple, no stridor  CARDIO:  normal rate, regular rhythm, appears well-perfused  PULM:  No respiratory distress, CTAB GI/GU:  non-distended, no focal tenderness, no Murphy's sign MSK/SPINE:  No gross deformities, no edema, moves all extremities  SKIN:  no rash, atraumatic   *Additional and/or pertinent findings included in MDM below  Diagnostic and Interventional Summary    EKG Interpretation Date/Time:  Friday February 14 2024 20:45:03 EST Ventricular Rate:  74 PR Interval:  178 QRS Duration:  146 QT Interval:  436 QTC Calculation: 483 R Axis:   -48  Text Interpretation: Normal sinus rhythm 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 block Possible Lateral infarct , age  undetermined Inferior infarct , age undetermined Abnormal ECG When compared with ECG of 01-Oct-2023 08:46, PREVIOUS ECG IS PRESENT No significant change was found Confirmed by Glynn Octave [phone]) on 02/15/2024 4:01:08 AM       Labs Reviewed  COMPREHENSIVE METABOLIC PANEL - Abnormal; Notable for the following components:      Result Value   Glucose, Bld 102 (*)    AST 56 (*)    ALT 48 (*)    All other components within normal limits  CBC WITH DIFFERENTIAL/PLATELET  LIPASE, BLOOD  URINALYSIS, ROUTINE W REFLEX MICROSCOPIC  TROPONIN I (HIGH SENSITIVITY)  TROPONIN I (HIGH SENSITIVITY)    US Abdomen Limited RUQ (LIVER/GB)  Final Result    DG Chest 1 View  Final Result      Medications - No data to display   Procedures  /  Critical Care Procedures  ED Course and Medical Decision Making  I have reviewed the triage vital signs, the nursing notes, and pertinent available records from the EMR.  Social Determinants Affecting Complexity of Care: Patient has no clinically significant social determinants affecting this chief complaint..   ED Course:    Medical Decision Making Patient here with right upper abdominal pain that radiates into his back.  Ultrasound ordered in triage is notable for cholelithiasis and gallbladder sludge, but without evidence of cholecystitis.  On my exam, the patient is pain-free.  He does not have any tenderness.  He does have history of  prior MI.  Troponins today are flat at 5.  No acute ischemic EKG changes.    No ripping or tearing sensation, doubt aortic dissection.  No pneumonia seen on chest x-ray.     No prolonged QT.  Will give Zofran for any potential recurrent nausea.  Will prescribe Percocet for breakthrough pain.  I have advised patient to follow-up with general surgery.  Return precautions are discussed.  Problems Addressed: Calculus of gallbladder without cholecystitis without obstruction: acute illness or injury    Details: Recommend  outpatient follow-up with general surgery.  No evidence of acute cholecystitis seen.  Amount and/or Complexity of Data Reviewed Labs: ordered.    Details: Minimally elevated LFTs, but no elevated T. bili.  Normal white blood cell count.  Risk Prescription drug management.         Consultants: No consultations were needed in caring for this patient.   Treatment and Plan: I considered admission due to patient's initial presentation, but after considering the examination and diagnostic results, patient will not require admission and can be discharged with outpatient follow-up.    Final Clinical Impressions(s) / ED Diagnoses     ICD-10-CM   1. Calculus of gallbladder without cholecystitis without obstruction  K80.20       ED Discharge Orders          Ordered    oxyCODONE-acetaminophen (PERCOCET) 5-325 MG tablet  Every 6 hours PRN        02/15/24 0410    ondansetron (ZOFRAN-ODT) 4 MG disintegrating tablet  Every 8 hours PRN        02/15/24 0410              Discharge Instructions Discussed with and Provided to Patient:     Discharge Instructions      Return for new or worsening symptoms, fever, persistent vomiting, or uncontrolled pain.       Roxy Horseman, PA-C 02/15/24 0414    Roxy Horseman, PA-C 02/15/24 [id number]    Glynn Octave, MD 02/16/24 [phone]

## 2024-02-15 NOTE — Discharge Instructions (Addendum)
 Return for new or worsening symptoms, fever, persistent vomiting, or uncontrolled pain.

## 2024-02-17 ENCOUNTER — Telehealth: Payer: Self-pay | Admitting: *Deleted

## 2024-02-17 NOTE — Telephone Encounter (Signed)
   Pre-operative Risk Assessment    Patient Name: Brad Woods  DOB: [birth year] MRN: [id number]   Date of last office visit: 10/01/2023 Date of next office visit: None  Request for Surgical Clearance    Procedure:   Right Lunar Tunnel Release Guyon's Canal, Right  small digit trigger release.  Date of Surgery:  Clearance TBD                                 Surgeon:  Dr. Samuella Cota Surgeon's Group or Practice Name:  [facility] [place]  Phone number:  [phone] Fax number:  [phone]   Type of Clearance Requested:   - Medical  - Pharmacy:  Hold Aspirin 7 days nad Plavix 5 days.   Type of Anesthesia:  General    Additional requests/questions:    Signed, Emmit Pomfret   02/17/2024, 2:48 PM

## 2024-02-17 NOTE — Telephone Encounter (Signed)
 Pt has been scheduled tele preop appt 02/28/24. Med rec and consent are done.    Patient Consent for Virtual Visit        Brad Woods has provided verbal consent on 02/17/2024 for a virtual visit (video or telephone).   CONSENT FOR VIRTUAL VISIT FOR:  Brad Woods  By participating in this virtual visit I agree to the following:  I hereby voluntarily request, consent and authorize [facility] and its employed or contracted physicians, physician assistants, nurse practitioners or other licensed health care professionals (the Practitioner), to provide me with telemedicine health care services (the "Services") as deemed necessary by the treating Practitioner. I acknowledge and consent to receive the Services by the Practitioner via telemedicine. I understand that the telemedicine visit will involve communicating with the Practitioner through live audiovisual communication technology and the disclosure of certain medical information by electronic transmission. I acknowledge that I have been given the opportunity to request an in-person assessment or other available alternative prior to the telemedicine visit and am voluntarily participating in the telemedicine visit.  I understand that I have the right to withhold or withdraw my consent to the use of telemedicine in the course of my care at any time, without affecting my right to future care or treatment, and that the Practitioner or I may terminate the telemedicine visit at any time. I understand that I have the right to inspect all information obtained and/or recorded in the course of the telemedicine visit and may receive copies of available information for a reasonable fee.  I understand that some of the potential risks of receiving the Services via telemedicine include:  Delay or interruption in medical evaluation due to technological equipment failure or disruption; Information transmitted may not be sufficient (e.g. poor  resolution of images) to allow for appropriate medical decision making by the Practitioner; and/or  In rare instances, security protocols could fail, causing a breach of personal health information.  Furthermore, I acknowledge that it is my responsibility to provide information about my medical history, conditions and care that is complete and accurate to the best of my ability. I acknowledge that Practitioner's advice, recommendations, and/or decision may be based on factors not within their control, such as incomplete or inaccurate data provided by me or distortions of diagnostic images or specimens that may result from electronic transmissions. I understand that the practice of medicine is not an exact science and that Practitioner makes no warranties or guarantees regarding treatment outcomes. I acknowledge that a copy of this consent can be made available to me via my patient portal [facility] MyChart), or I can request a printed copy by calling the office of [facility].    I understand that my insurance will be billed for this visit.   I have read or had this consent read to me. I understand the contents of this consent, which adequately explains the benefits and risks of the Services being provided via telemedicine.  I have been provided ample opportunity to ask questions regarding this consent and the Services and have had my questions answered to my satisfaction. I give my informed consent for the services to be provided through the use of telemedicine in my medical care

## 2024-02-17 NOTE — Telephone Encounter (Signed)
   Name: Brad Woods  DOB: [birth year]  MRN: [id number]  Primary Cardiologist: Kristeen Miss, MD   Preoperative team, please contact this patient and set up a phone call appointment for further preoperative risk assessment. Please obtain consent and complete medication review. Thank you for your help.  I confirm that guidance regarding antiplatelet and oral anticoagulation therapy has been completed and, if necessary, noted below.  Patient may hold Plavix for 5 days prior to procedure, please resume when medically safe to do so. Ideally aspirin would be continued through the perioperative period, if bleeding risk is felt to be too high aspirin can be held for 5-7 days prior to procedure, please resume when safe to do so from a bleeding perspective.   I also confirmed the patient resides in the state of [place]. As per [facility] Medical Board telemedicine laws, the patient must reside in the state in which the provider is licensed.   Napoleon Form, Leodis Rains, NP 02/17/2024, 3:02 PM [facility]

## 2024-02-17 NOTE — Telephone Encounter (Signed)
 Pt has been scheduled tele preop appt 02/28/24. Med rec and consent are done.

## 2024-02-18 ENCOUNTER — Telehealth: Payer: Self-pay | Admitting: Pharmacy Technician

## 2024-02-18 ENCOUNTER — Other Ambulatory Visit (HOSPITAL_COMMUNITY): Payer: Self-pay

## 2024-02-18 NOTE — Telephone Encounter (Signed)
 Pharmacy Patient Advocate Encounter   Received notification from Physician's Office that prior authorization for Repatha is required/requested.   Insurance verification completed.   The patient is insured through [facility] ADVANTAGE/RX ADVANCE .   Per test claim: The current 02/18/24 day co-pay is, $117.50- 3 months.  No PA needed at this time. This test claim was processed through [facility]- copay amounts may vary at other pharmacies due to pharmacy/plan contracts, or as the patient moves through the different stages of their insurance plan.

## 2024-02-19 MED ORDER — REPATHA SURECLICK 140 MG/ML ~~LOC~~ SOAJ: 140.0000 mg | SUBCUTANEOUS | 1 refills | Status: AC

## 2024-02-19 NOTE — Addendum Note (Signed)
 Addended by: Cheree Ditto on: 02/19/2024 08:03 AM   Modules accepted: Orders

## 2024-02-20 ENCOUNTER — Telehealth: Payer: Self-pay | Admitting: Cardiovascular Disease

## 2024-02-20 NOTE — Telephone Encounter (Signed)
   Pre-operative Risk Assessment    Patient Name: Brad Woods  DOB: 09/18/1946 MRN: [id number]   Date of last office visit: 10/01/2023 Date of next office visit: none   Request for Surgical Clearance    Procedure:   gallbladder surgery  Date of Surgery:  Clearance TBD                                Surgeon:  Dr. Feliciana Rossetti Surgeon's Group or Practice Name:  [facility] Phone number:  [phone] Fax number:  [phone]   Type of Clearance Requested:   - Medical  - Pharmacy:  Hold Aspirin and Clopidogrel (Plavix) need instruction   Type of Anesthesia:  General    Additional requests/questions:    Signed, Royann Shivers   02/20/2024, 3:10 PM

## 2024-02-20 NOTE — Telephone Encounter (Signed)
   Name: Brad Woods  DOB: [birth year]  MRN: [id number]  Primary Cardiologist: Kristeen Miss, MD   Preoperative team, please contact this patient and set up a phone call appointment for further preoperative risk assessment. Please obtain consent and complete medication review. Thank you for your help. Patient has a telephone visit scheduled for 02/28/2019 for preop cardiac valuation for a different procedure.  Would it be possible combine these 2 visits?  Per office protocol, if patient is without any new symptoms or concerns at the time of their virtual visit, he may hold Plavix for 5 days prior to procedure. Please resume Plavix as soon as possible postprocedure, at the discretion of the surgeon.   Regarding ASA therapy, we recommend continuation of ASA throughout the perioperative period.   I also confirmed the patient resides in the state of [place]. As per [facility] Medical Board telemedicine laws, the patient must reside in the state in which the provider is licensed.   Joylene Grapes, NP 02/20/2024, 3:45 PM [facility]

## 2024-02-20 NOTE — Telephone Encounter (Signed)
 Pt is ok with doing the tele visit 02/28/24 for both surgeries.

## 2024-02-24 ENCOUNTER — Ambulatory Visit: Payer: PPO | Admitting: Orthopedic Surgery

## 2024-02-25 ENCOUNTER — Ambulatory Visit (HOSPITAL_BASED_OUTPATIENT_CLINIC_OR_DEPARTMENT_OTHER): Payer: PPO | Admitting: Pulmonary Disease

## 2024-02-28 ENCOUNTER — Ambulatory Visit: Payer: PPO | Attending: Nurse Practitioner

## 2024-02-28 DIAGNOSIS — Z01810 Encounter for preprocedural cardiovascular examination: Secondary | ICD-10-CM

## 2024-02-28 NOTE — Progress Notes (Signed)
 Virtual Visit via Telephone Note   Because of Json A Doby co-morbid illnesses, he is at least at moderate risk for complications without adequate follow up.  This format is felt to be most appropriate for this patient at this time.  Due to technical limitations with video connection (technology), today's appointment will be conducted as an audio only telehealth visit, and Brad Woods verbally agreed to proceed in this manner.   All issues noted in this document were discussed and addressed.  No physical exam could be performed with this format.  Evaluation Performed:  Preoperative cardiovascular risk assessment _____________   Date:  02/28/2024   Patient ID:  Brad Woods, DOB [birth year], MRN [id number] Patient Location:  Home Provider location:   Office  Primary Care Provider:  Joycelyn Rua, MD Primary Cardiologist:  Kristeen Miss, MD  Chief Complaint / Patient Profile   78 y.o. y/o male with a h/o CAD s/p NSTEMI 2017 with PCI and DES to AV groove, HLD, HTN, obesity, OSA (on CPAP) who is pending gallbladder surgery and presents today for telephonic preoperative cardiovascular risk assessment.  History of Present Illness    Brad Woods is a 78 y.o. male who presents via Web designer for a telehealth visit today.  Pt was last seen in cardiology clinic on 10/01/2023 by Rise Paganini, NP.  At that time Brad Woods was doing well but noted some shortness of breath with activity such as walking to the mailbox but denied any chest discomfort. He was seen in the ED on 02/15/2024 with complaint of chest and back pain.  He completed a right upper abdominal ultrasound that revealed gallstones without obstruction as the primary cause.  The patient is now pending procedure as outlined above. Since his last visit, he has been doing well with no new cardiac complaints.  He is able to complete greater than 4 METS of activity without any difficulty.  He reports no  recurrence of chest and shoulder pain since his ED visit.  He denies chest pain, shortness of breath, lower extremity edema, fatigue, palpitations, melena, hematuria, hemoptysis, diaphoresis, weakness, presyncope, syncope, orthopnea, and PND.   Patient can discontinue Plavix 5 days prior to procedure and should continue aspirin through the perioperative period  Past Medical History    Past Medical History:  Diagnosis Date   Allergy    Arthritis    Asthma    when younger   Coronary artery disease    s/p stenting 10/2016   Dyspnea    GERD (gastroesophageal reflux disease)    Headache    Hyperlipidemia    Hypertension    Kidney stones    Myocardial infarction (HCC)    Pneumonia    hx of   Sleep apnea    cpap   Past Surgical History:  Procedure Laterality Date   BICEPT TENODESIS Right 10/22/2023   Procedure: BICEPS TENODESIS;  Surgeon: Cammy Copa, MD;  Location: [facility] OR;  Service: Orthopedics;  Laterality: Right;   CARDIAC CATHETERIZATION N/A 05/20/2016   Procedure: Left Heart Cath and Coronary Angiography;  Surgeon: Runell Gess, MD;  Location: [facility] INVASIVE CV LAB;  Service: Cardiovascular;  Laterality: N/A;   CARDIAC CATHETERIZATION N/A 11/22/2016   Procedure: Left Heart Cath and Coronary Angiography;  Surgeon: Yvonne Kendall, MD;  Location: [facility] INVASIVE CV LAB;  Service: Cardiovascular;  Laterality: N/A;   CARPAL TUNNEL RELEASE Right    COLONOSCOPY     EYE SURGERY Left    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, 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   INGUINAL HERNIA REPAIR  1955   LEFT HEART CATH AND CORONARY ANGIOGRAPHY N/A 05/31/2020   Procedure: LEFT HEART CATH AND CORONARY ANGIOGRAPHY;  Surgeon: Lennette Bihari, MD;  Location: [facility] INVASIVE CV LAB;  Service: Cardiovascular;  Laterality: N/A;   RECTAL SURGERY     REPLACEMENT TOTAL KNEE Bilateral    REVERSE SHOULDER ARTHROPLASTY Right 10/22/2023   Procedure: RIGHT REVERSE SHOULDER ARTHROPLASTY;  Surgeon: Cammy Copa, MD;  Location[facility] OR;  Service: Orthopedics;  Laterality: Right;   TONSILLECTOMY      Allergies  Allergies  Allergen Reactions   Hydromorphone Hcl Other (See Comments)    "flatlined" - Patient states he was overdosed. He states he isn't allergic.   Hydromorphone     Cardiac arrest   Aleve [Naproxen Sodium] Dermatitis    States he was given aleve in the hospital and it caused welts.     Home Medications    Prior to Admission medications   Medication Sig Start Date End Date Taking? Authorizing Provider  albuterol (VENTOLIN HFA) 108 (90 Base) MCG/ACT inhaler Inhale 2 puffs into the lungs every 6 (six) hours as needed for wheezing or shortness of breath. 10/03/23   Hunsucker, Lesia Sago, MD  aspirin EC 81 MG EC tablet Take 1 tablet (81 mg total) by mouth daily. 05/22/16   Orpah Cobb, MD  carvedilol (COREG) 12.5 MG tablet Take 1 tablet (12.5 mg total) by mouth 2 (two) times daily. 10/01/23   Denyce Robert, NP  clopidogrel (PLAVIX) 75 MG tablet Take 1 tablet (75 mg total) by mouth daily. 10/01/23   Denyce Robert, NP  docusate sodium (COLACE) 100 MG capsule Take 1 capsule (100 mg total) by mouth 2 (two) times daily. 10/23/23   Magnant, Charles L, PA-C  Evolocumab (REPATHA SURECLICK) 140 MG/ML SOAJ Inject 140 mg into the skin every 14 (fourteen) days. 02/19/24   Nahser, Deloris Ping, MD  finasteride (PROSCAR) 5 MG tablet Take 5 mg by mouth daily.    [provider]  fluticasone (FLONASE) 50 MCG/ACT nasal spray Place 2 sprays into the nose daily as needed for allergies.  12/07/13   [provider]  Fluticasone-Umeclidin-Vilant (TRELEGY ELLIPTA) 200-62.5-25 MCG/ACT AEPB Inhale 1 puff into the lungs daily. 10/03/23   Hunsucker, Lesia Sago, MD  gabapentin (NEURONTIN) 300 MG capsule Take 1 capsule (300 mg total) by mouth 3 (three) times daily. 12/16/23   Cammy Copa, MD  isosorbide mononitrate (IMDUR) 60 MG 24 hr tablet Take 1 tablet (60 mg total) by  mouth daily. 10/01/23 12/30/23  Denyce Robert, NP  Multiple Vitamins-Minerals (CENTRUM SILVER 50+MEN PO) Take 1 tablet by mouth daily.    [provider]  nitroGLYCERIN (NITROSTAT) 0.4 MG SL tablet Dissolve 1 tablet under the tongue every 5 minutes as needed for chest pain. Max of 3 doses, then 911. 07/09/23   Nahser, Deloris Ping, MD  ondansetron (ZOFRAN-ODT) 4 MG disintegrating tablet Take 1 tablet (4 mg total) by mouth every 8 (eight) hours as needed for nausea or vomiting. 02/15/24   Roxy Horseman, PA-C  oxyCODONE-acetaminophen (PERCOCET) 5-325 MG tablet Take 1-2 tablets by mouth every 6 (six) hours as needed. 02/15/24   Roxy Horseman, PA-C  Semaglutide, 1 MG/DOSE, (OZEMPIC, 1 MG/DOSE,) 2 MG/1.5ML SOPN Inject 2 mg into the skin once a week. Patient not taking: Reported on 02/17/2024    [provider]  tamsulosin (FLOMAX) 0.4 MG CAPS  capsule Take 0.4 mg by mouth at bedtime.  08/14/16   [provider]    Physical Exam    Vital Signs:  Brad Woods does not have vital signs available for review today.  Given telephonic nature of communication, physical exam is limited. AAOx3. NAD. Normal affect.  Speech and respirations are unlabored.  Accessory Clinical Findings    None  Assessment & Plan    1.  Preoperative Cardiovascular Risk Assessment: -Patient's RCRI score 6.6%  The patient affirms he has been doing well without any new cardiac symptoms. They are able to achieve 7 METS without cardiac limitations. Therefore, based on ACC/AHA guidelines, the patient would be at acceptable risk for the planned procedure without further cardiovascular testing. The patient was advised that if he develops new symptoms prior to surgery to contact our office to arrange for a follow-up visit, and he verbalized understanding.   The patient was advised that if he develops new symptoms prior to surgery to contact our office to arrange for a follow-up visit, and he verbalized  understanding.  Patient can discontinue Plavix 5 days prior to procedure and should continue aspirin through the perioperative period  A copy of this note will be routed to requesting surgeon.  Time:   Today, I have spent 8 minutes with the patient with telehealth technology discussing medical history, symptoms, and management plan.     Napoleon Form, Leodis Rains, NP  02/28/2024, 7:12 AM

## 2024-03-02 ENCOUNTER — Telehealth: Payer: Self-pay | Admitting: Orthopedic Surgery

## 2024-03-02 NOTE — Telephone Encounter (Signed)
 I spoke with the patient on 3/7 and scheduled him for surgery on 4/11. Patient is also going to have to have surgery to remove gallstones. He is to find out surgery date for that possibly this week. Patient would like to know how long between surgeries should he wait? Please advise.

## 2024-03-03 NOTE — Pre-Procedure Instructions (Addendum)
 Surgical Instructions   Your procedure is scheduled on March 10, 2024. Report to [facility] Main Entrance "A" at 5:30 A.M., then check in with the Admitting office. Any questions or running late day of surgery: call [phone]  Questions prior to your surgery date: call [phone], Monday-Friday, 8am-4pm. If you experience any cold or flu symptoms such as cough, fever, chills, shortness of breath, etc. between now and your scheduled surgery, please notify us at the above number.     Remember:  Do not eat after midnight the night before your surgery   You may drink clear liquids until 4:30 AM the morning of your surgery.   Clear liquids allowed are: Water, Non-Citrus Juices (without pulp), Carbonated Beverages, Clear Tea (no milk, honey, etc.), Black Coffee Only (NO MILK, CREAM OR POWDERED CREAMER of any kind), and Gatorade.    Take these medicines the morning of surgery with A SIP OF WATER: carvedilol (COREG)  finasteride (PROSCAR)  isosorbide mononitrate (IMDUR)    May take these medicines IF NEEDED: albuterol (VENTOLIN HFA) inhaler - please bring inhaler with you morning of surgery fluticasone (FLONASE) nasal spray  Fluticasone-Umeclidin-Vilant (TRELEGY ELLIPTA) inhaler nitroGLYCERIN (NITROSTAT) - if dose taken prior to surgery, please call either of the above phone numbers ondansetron (ZOFRAN-ODT)    STOP taking your clopidogrel (PLAVIX) five days prior to surgery. Your last dose will be March 12th.   Follow your surgeon's instructions on when to stop Aspirin.  If no instructions were given by your surgeon then you will need to call the office to get those instructions.     One week prior to surgery, STOP taking any Aleve, Naproxen, Ibuprofen, Motrin, Advil, Goody's, BC's, all herbal medications, fish oil, and non-prescription vitamins.                     Do NOT Smoke (Tobacco/Vaping) for 24 hours prior to your procedure.  If you use a CPAP at night, you may bring  your mask/headgear for your overnight stay.   You will be asked to remove any contacts, glasses, piercing's, hearing aid's, dentures/partials prior to surgery. Please bring cases for these items if needed.    Patients discharged the day of surgery will not be allowed to drive home, and someone needs to stay with them for 24 hours.  SURGICAL WAITING ROOM VISITATION Patients may have no more than 2 support people in the waiting area - these visitors may rotate.   Pre-op nurse will coordinate an appropriate time for 1 ADULT support person, who may not rotate, to accompany patient in pre-op.  Children under the age of 45 must have an adult with them who is not the patient and must remain in the main waiting area with an adult.  If the patient needs to stay at the hospital during part of their recovery, the visitor guidelines for inpatient rooms apply.  Please refer to the [facility] website for the visitor guidelines for any additional information.   If you received a COVID test during your pre-op visit  it is requested that you wear a mask when out in public, stay away from anyone that may not be feeling well and notify your surgeon if you develop symptoms. If you have been in contact with anyone that has tested positive in the last 10 days please notify you surgeon.      Pre-operative CHG Bathing Instructions   You can play a key role in reducing the risk of infection after surgery.  Your skin needs to be as free of germs as possible. You can reduce the number of germs on your skin by washing with CHG (chlorhexidine gluconate) soap before surgery. CHG is an antiseptic soap that kills germs and continues to kill germs even after washing.   DO NOT use if you have an allergy to chlorhexidine/CHG or antibacterial soaps. If your skin becomes reddened or irritated, stop using the CHG and notify one of our RNs at [phone].              TAKE A SHOWER THE NIGHT BEFORE SURGERY AND THE DAY OF  SURGERY    Please keep in mind the following:  DO NOT shave, including legs and underarms, 48 hours prior to surgery.   You may shave your face before/day of surgery.  Place clean sheets on your bed the night before surgery Use a clean washcloth (not used since being washed) for each shower. DO NOT sleep with pet's night before surgery.  CHG Shower Instructions:  Wash your face and private area with normal soap. If you choose to wash your hair, wash first with your normal shampoo.  After you use shampoo/soap, rinse your hair and body thoroughly to remove shampoo/soap residue.  Turn the water OFF and apply half the bottle of CHG soap to a CLEAN washcloth.  Apply CHG soap ONLY FROM YOUR NECK DOWN TO YOUR TOES (washing for 3-5 minutes)  DO NOT use CHG soap on face, private areas, open wounds, or sores.  Pay special attention to the area where your surgery is being performed.  If you are having back surgery, having someone wash your back for you may be helpful. Wait 2 minutes after CHG soap is applied, then you may rinse off the CHG soap.  Pat dry with a clean towel  Put on clean pajamas    Additional instructions for the day of surgery: DO NOT APPLY any lotions, deodorants, cologne, or perfumes.   Do not wear jewelry or makeup Do not wear nail polish, gel polish, artificial nails, or any other type of covering on natural nails (fingers and toes) Do not bring valuables to the hospital. [facility] is not responsible for valuables/personal belongings. Put on clean/comfortable clothes.  Please brush your teeth.  Ask your nurse before applying any prescription medications to the skin.

## 2024-03-04 ENCOUNTER — Encounter (HOSPITAL_COMMUNITY): Payer: Self-pay

## 2024-03-04 ENCOUNTER — Encounter (HOSPITAL_COMMUNITY)
Admission: RE | Admit: 2024-03-04 | Discharge: 2024-03-04 | Disposition: A | Discharge status: Home / Self Care | Source: Ambulatory Visit | Attending: General Surgery | Admitting: General Surgery

## 2024-03-04 ENCOUNTER — Other Ambulatory Visit: Payer: Self-pay

## 2024-03-04 DIAGNOSIS — Z6837 Body mass index (BMI) 37.0-37.9, adult: Secondary | ICD-10-CM | POA: Diagnosis not present

## 2024-03-04 DIAGNOSIS — E785 Hyperlipidemia, unspecified: Secondary | ICD-10-CM | POA: Diagnosis not present

## 2024-03-04 DIAGNOSIS — K8010 Calculus of gallbladder with chronic cholecystitis without obstruction: Secondary | ICD-10-CM | POA: Insufficient documentation

## 2024-03-04 DIAGNOSIS — E669 Obesity, unspecified: Secondary | ICD-10-CM | POA: Insufficient documentation

## 2024-03-04 DIAGNOSIS — I10 Essential (primary) hypertension: Secondary | ICD-10-CM | POA: Diagnosis not present

## 2024-03-04 DIAGNOSIS — G4733 Obstructive sleep apnea (adult) (pediatric): Secondary | ICD-10-CM | POA: Diagnosis not present

## 2024-03-04 DIAGNOSIS — J45909 Unspecified asthma, uncomplicated: Secondary | ICD-10-CM | POA: Insufficient documentation

## 2024-03-04 DIAGNOSIS — Z01812 Encounter for preprocedural laboratory examination: Secondary | ICD-10-CM | POA: Diagnosis not present

## 2024-03-04 DIAGNOSIS — I2510 Atherosclerotic heart disease of native coronary artery without angina pectoris: Secondary | ICD-10-CM | POA: Insufficient documentation

## 2024-03-04 DIAGNOSIS — I252 Old myocardial infarction: Secondary | ICD-10-CM | POA: Insufficient documentation

## 2024-03-04 HISTORY — DX: Personal history of urinary calculi: Z87.442

## 2024-03-04 HISTORY — DX: Chronic obstructive pulmonary disease, unspecified: J44.9

## 2024-03-04 HISTORY — DX: Myoneural disorder, unspecified: G70.9

## 2024-03-04 NOTE — Progress Notes (Addendum)
 PCP - Dr. Joycelyn Rua Cardiologist - Dr. Kristeen Miss - Last office visit 10/01/2023 Pulmonologist - Dr. Vilma Meckel - Last office visit 01/01/2024  PPM/ICD - Denies Device Orders - n/a Rep Notified - n/a  Chest x-ray - 02/14/2024 EKG - 02/14/2024 Stress Test - 04/01/2014 (CE) ECHO - 08/11/2020 Cardiac Cath - 2017 with DES placed. Most recent cath 05/31/2020  Sleep Study - +OSA. Pt unable to tolerate CPAP  No DM  Last dose of GLP1 agonist- n/a GLP1 instructions: n/a  Blood Thinner Instructions: Pt instructed to stop Plavix 5 days prior to surgery. Last dose will be March 12th. Aspirin Instructions: Pt instructed to call surgeon's office to receive ASA instructions  ERAS Protcol - Clear liquids until 0430 morning of surgery PRE-SURGERY Ensure or G2- n/a  COVID TEST- n/a   Anesthesia review: Yes. Cardiac clearance and hx of COPD, Asthma and OSA  Patient denies shortness of breath, fever, cough and chest pain at PAT appointment. Pt denies any respiratory illness/infection   All instructions explained to the patient, with a verbal understanding of the material. Patient agrees to go over the instructions while at home for a better understanding. Patient also instructed to self quarantine after being tested for COVID-19. The opportunity to ask questions was provided.

## 2024-03-05 ENCOUNTER — Encounter (HOSPITAL_COMMUNITY): Payer: Self-pay

## 2024-03-05 ENCOUNTER — Encounter (HOSPITAL_BASED_OUTPATIENT_CLINIC_OR_DEPARTMENT_OTHER): Payer: PPO | Admitting: Pulmonary Disease

## 2024-03-05 NOTE — Progress Notes (Signed)
 Anesthesia Chart Review:  Case: [id number] Date/Time: 03/10/24 0715   Procedure: LAPAROSCOPIC CHOLECYSTECTOMY   Anesthesia type: General   Diagnosis: Calculus of gallbladder with chronic cholecystitis without obstruction [K80.10]   Pre-op diagnosis: CHRONIC CALCULUS CHOLECYSTITIS   [facility] / [facility]   Surgeons: Kinsinger, De Blanch, MD       DISCUSSION: Patient is a 78 year old male scheduled for the above procedure. ED visit 02/15/24 for RUQ abdominal pain. US revealed gallstones and sludge. LFTS mildly elevated. Referred to general surgery.    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 BMI is consistent with obesity.   Preoperative cardiology evaluation was done on 02/28/24 by Neila Gear, NP: "Patient's RCRI score 6.6%   The patient affirms he has been doing well without any new cardiac symptoms. They are able to achieve 7 METS without cardiac limitations. Therefore, based on ACC/AHA guidelines, the patient would be at acceptable risk for the planned procedure without further cardiovascular testing. The patient was advised that if he develops new symptoms prior to surgery to contact our office to arrange for a follow-up visit, and he verbalized understanding...  Patient can discontinue Plavix 5 days prior to procedure and should continue aspirin through the perioperative period."   He got established with pulmonologist Judeth Horn, Molli Hazard, MD on 10/03/23 for evaluation of long term DOE, present since high school but worse in the past 4-5 years. Previously did not think inhalers helped much. 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 Prior cardiac workup indicated elevated LVEDP of 17 on left heart catheterization 05/2020. He has CAD and has stenting. TTE 07/2020 revealed LVEF 55-60%, normal RV systolic function,  trivial AI. CXR ordered for baseline. Consider CT scan in the future. Recommended starting high-dose Trelegy 1 puff daily and albuterol use prior to exercise. (He is currently only using Trelegy daily as needed.) An updated sleep study ordered and done on 10/18/23 and showed severe apnea with mixed central and obstructive apnea. A BiPAP titration study has been ordered but is pending. Depending on titration study, he may have Mr. Henricksen follow-up with one of their sleep specialists. Patient has not scheduled the titration study yet.  Anesthesia team to evaluate on the day of surgery. Last Plavix planned for 03/04/24.     VS: BP 120/77   Pulse 87   Temp 36.6 C   Resp 18   Ht 6\' 1"  (1.854 m)   Wt 128.1 kg   SpO2 97%   BMI 37.24 kg/m   PROVIDERS: Joycelyn Rua, MD is PCP Kristeen Miss, MD is cardiologist Vilma Meckel, MD is pulmonologist   LABS: Last lab results in [facility] include: Lab Results  Component Value Date   WBC 9.7 02/14/2024   HGB 16.4 02/14/2024   HCT 47.5 02/14/2024   PLT 193 02/14/2024   GLUCOSE 102 (H) 02/14/2024   ALT 48 (H) 02/14/2024   AST 56 (H) 02/14/2024   NA 137 02/14/2024   K 4.5 02/14/2024   CL 101 02/14/2024   CREATININE 0.99 02/14/2024   BUN 18 02/14/2024   CO2 27 02/14/2024    Home Sleep Study 10/18/23: Respiratory Analysis Summary: A total of 156 apneas (63 of which were Central/Mixed based on respiratory effort) and 115 hypopneas were identified. The total number of obstructive events (apneas and hypopneas) was 208. The Total Apnea Index (central/mixed and  obstructive events) was 26.6 per hour. The Central/Mixed Apnea Index was 10.7 per hour based on respiratory effort. The Central/Mixed % Ratio (was H4%) was 28%. With H3% was 23%.   During the recording period, there was a total of 213 desaturations.  The baseline oxygen level was 99% and the lowest oxygen level with 4% desaturation was 76%.  The time spent at or below an oxygen saturation  of 88% was 56 min 16%.  The AHI 3% (RDI) was 46.2 per hour.  During periods of the recording the Maximum Density of the AHI 3% (RDI) was elevated up to 80.0.  The AHI 4% was 38.0 per hour; this index includes hypopneas that exhibit oxygen desaturations of 4% or greater, and all apneas.   IMAGES: [place] Abd 02/14/24: IMPRESSION: Cholelithiasis and sludge within the gallbladder which is mildly distended. No wall thickening, pericholecystic fluid or sonographic Murphy sign to suggest acute cholecystitis.  1V CXR 02/14/24: FINDINGS: The heart size and mediastinal contours are within normal limits. Both lungs are clear. Right shoulder arthroplasty is present. There is a healed left fifth rib fracture. IMPRESSION: No active disease.       EKG: 02/14/24: Normal sinus rhythm 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 block Possible Lateral infarct , age undetermined Inferior infarct , age undetermined Abnormal ECG When compared with ECG of 01-Oct-2023 08:46, PREVIOUS ECG IS PRESENT No significant change was found.     CV: Echo 08/11/2020: IMPRESSIONS   1. Left ventricular ejection fraction, by estimation, is 55 to 60%. The  left ventricle has normal function. The left ventricle has no regional  wall motion abnormalities. Left ventricular diastolic parameters are  indeterminate.   2. Right ventricular systolic function is normal. The right ventricular  size is normal. Tricuspid regurgitation signal is inadequate for assessing  PA pressure.   3. The mitral valve is normal in structure. No evidence of mitral valve  regurgitation. No evidence of mitral stenosis.   4. The aortic valve is tricuspid. Aortic valve regurgitation is trivial.  No aortic stenosis is present.   5. The inferior vena cava is normal in size with greater than 50%  respiratory variability, suggesting right atrial pressure of 3 mmHg.  - Comparison(s): No significant change from prior study.  05/19/16 EF 60-65%.      Cardiac cath 05/31/2020: RPDA-1 lesion is 75% stenosed. RPDA-2 lesion is 50% stenosed. Ost LAD to Prox LAD lesion is 40% stenosed. Prox RCA-1 lesion is 20% stenosed. Non-stenotic Prox RCA-2 lesion. 1st Diag lesion is 40% stenosed. Prox LAD to Mid LAD lesion is 30% stenosed. Non-stenotic Mid LAD-1 lesion. Mid LAD-2 lesion is 50% stenosed. 2nd Diag lesion is 50% stenosed. Dist LAD-1 lesion is 50% stenosed. Dist LAD-2 lesion is 75% stenosed. 1st Mrg-1 lesion is 20% stenosed. 1st Mrg-2 lesion is 20% stenosed. Previously placed Prox Cx to Mid Cx stent (unknown type) is widely patent.   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  There is diffuse 50% stenoses in the mid LAD second diagonal vessel and focal 75% apical LAD stenosis and small caliber apical segment.  The left circumflex stent is widely patent.  There is 20% narrowing in the OM1 vessel.  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  There is 7075% ostial stenosis of the PDA  vessel with diffuse 50% mid stenosis not significantly changed from the 2017 angiographic studies.   Low normal LV function with EF estimated 50 to 55%.  LVEDP 17 mm.   RECOMMENDATION: Patient has been on DAPT therapy with aspirin/Plavix.  Recommend medical therapy for his CAD.  Recommend 2D echo Doppler study for reassessment of systolic as well as diastolic function and valvular architecture.  Aggressive lipid-lowering therapy with target LDL less than 70 and optimal blood pressure control target less than 130/80.    Past Medical History:  Diagnosis Date   Allergy    Arthritis    Asthma    when younger   COPD (chronic obstructive pulmonary disease) (HCC)    Coronary artery disease    s/p stenting 10/2016   Dyspnea     History of kidney stones    15x   Hyperlipidemia    Hypertension    Myocardial infarction [facility])    Neuromuscular disorder (HCC)    neuropathy right hand   Pneumonia    As a child   Sleep apnea    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    Past Surgical History:  Procedure Laterality Date   BICEPT TENODESIS Right 10/22/2023   Procedure: BICEPS TENODESIS;  Surgeon: Cammy Copa, MD;  Location: [facility] OR;  Service: Orthopedics;  Laterality: Right;   CARDIAC CATHETERIZATION N/A 05/20/2016   Procedure: Left Heart Cath and Coronary Angiography;  Surgeon: Runell Gess, MD;  Location: [facility] INVASIVE CV LAB;  Service: Cardiovascular;  Laterality: N/A;   CARDIAC CATHETERIZATION N/A 11/22/2016   Procedure: Left Heart Cath and Coronary Angiography;  Surgeon: Yvonne Kendall, MD;  Location: [facility] INVASIVE CV LAB;  Service: Cardiovascular;  Laterality: N/A;   CARPAL TUNNEL RELEASE Right    COLONOSCOPY     EYE SURGERY Left    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, 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   INGUINAL HERNIA REPAIR  1955   LEFT HEART CATH AND CORONARY ANGIOGRAPHY N/A 05/31/2020   Procedure: LEFT HEART CATH AND CORONARY ANGIOGRAPHY;  Surgeon: Lennette Bihari, MD;  Location: [facility] INVASIVE CV LAB;  Service: Cardiovascular;  Laterality: N/A;   RECTAL SURGERY     polyps and fissures removed   REPLACEMENT TOTAL KNEE Bilateral    REVERSE SHOULDER ARTHROPLASTY Right 10/22/2023   Procedure: RIGHT REVERSE SHOULDER ARTHROPLASTY;  Surgeon: Cammy Copa, MD;  Location: [facility] OR;  Service: Orthopedics;  Laterality: Right;   TONSILLECTOMY      MEDICATIONS:  albuterol (VENTOLIN HFA) 108 (90 Base) MCG/ACT inhaler   aspirin EC 81 MG EC tablet   carvedilol (COREG) 12.5 MG tablet   clopidogrel (PLAVIX) 75 MG tablet   docusate sodium (COLACE) 100 MG capsule   Evolocumab (REPATHA SURECLICK) 140 MG/ML SOAJ   finasteride (PROSCAR) 5 MG tablet   fluticasone (FLONASE) 50 MCG/ACT nasal spray    Fluticasone-Umeclidin-Vilant (TRELEGY ELLIPTA) 200-62.5-25 MCG/ACT AEPB   gabapentin (NEURONTIN) 300 MG capsule   isosorbide mononitrate (IMDUR) 60 MG 24 hr tablet   Multiple Vitamins-Minerals (CENTRUM SILVER 50+MEN PO)   nitroGLYCERIN (NITROSTAT) 0.4 MG SL tablet   ondansetron (ZOFRAN-ODT) 4 MG disintegrating tablet   oxyCODONE-acetaminophen (PERCOCET) 5-325 MG tablet   tamsulosin (FLOMAX) 0.4 MG CAPS capsule   No current facility-administered medications for this encounter.    Shonna Chock, PA-C Surgical Short Stay/Anesthesiology [facility] Phone [phone] [facility] Phone [phone] 03/05/2024 2:57 PM

## 2024-03-05 NOTE — Anesthesia Preprocedure Evaluation (Addendum)
 Anesthesia Evaluation  Patient identified by MRN, date of birth, ID band Patient awake    Reviewed: Allergy & Precautions, H&P , NPO status , Patient's Chart, lab work & pertinent test results  Airway Mallampati: II  TM Distance: >3 FB Neck ROM: Full    Dental no notable dental hx. (+) Teeth Intact, Dental Advisory Given, Partial Upper   Pulmonary neg pulmonary ROS, shortness of breath, asthma , sleep apnea , pneumonia, COPD   Pulmonary exam normal breath sounds clear to auscultation       Cardiovascular hypertension, Pt. on medications + angina  + CAD, + Past MI, + Cardiac Stents and + Peripheral Vascular Disease  negative cardio ROS Normal cardiovascular exam Rhythm:Regular Rate:Normal     Neuro/Psych  Headaches  Neuromuscular disease negative neurological ROS  negative psych ROS   GI/Hepatic negative GI ROS, Neg liver ROS,GERD  ,,  Endo/Other  negative endocrine ROSHypothyroidism    Renal/GU negative Renal ROS  negative genitourinary   Musculoskeletal negative musculoskeletal ROS (+) Arthritis ,    Abdominal   Peds negative pediatric ROS (+)  Hematology negative hematology ROS (+)   Anesthesia Other Findings   Reproductive/Obstetrics negative OB ROS                             Anesthesia Physical Anesthesia Plan  ASA: 3  Anesthesia Plan: General   Post-op Pain Management: Tylenol PO (pre-op)*, Celebrex PO (pre-op)* and Ketamine IV*   Induction: Intravenous  PONV Risk Score and Plan: 2 and Ondansetron, Dexamethasone and Treatment may vary due to age or medical condition  Airway Management Planned: Oral ETT  Additional Equipment: None  Intra-op Plan:   Post-operative Plan: Extubation in OR  Informed Consent:   Plan Discussed with: Anesthesiologist and CRNA  Anesthesia Plan Comments: (PAT note written 03/05/2024 by Shonna Chock, PA-C. Has combined central and  obstructive OSA, intolerant to CPAP.  DISCUSSION: Patient is a 78 year old male scheduled for the above procedure. ED visit 02/15/24 for RUQ abdominal pain. US revealed gallstones and sludge. LFTS mildly elevated. Referred to general surgery.    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 BMI is consistent with obesity.   Preoperative cardiology evaluation was done on 02/28/24 by Neila Gear, NP: "Patient's RCRI score 6.6%   The patient affirms he has been doing well without any new cardiac symptoms. They are able to achieve 7 METS without cardiac limitations. Therefore, based on ACC/AHA guidelines, the patient would be at acceptable risk for the planned procedure without further cardiovascular testing. The patient was advised that if he develops new symptoms prior to surgery to contact our office to arrange for a follow-up visit, and he verbalized understanding...  Patient can discontinue Plavix 5 days prior to procedure and should continue aspirin through the perioperative period."   He got established with pulmonologist Judeth Horn, Molli Hazard, MD on 10/03/23 for evaluation of long term DOE, present since high school but worse in the past 4-5 years. Previously did not think inhalers helped much. 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 Prior cardiac workup indicated elevated LVEDP of 17 on left heart catheterization 05/2020. He has CAD and has stenting. TTE 07/2020 revealed LVEF 55-60%, normal RV systolic function, trivial AI. CXR ordered for baseline. Consider CT scan in the future. Recommended starting high-dose Trelegy  1 puff daily and albuterol use prior to exercise. (He is currently only using Trelegy daily as needed.) An updated sleep study ordered and done on 10/18/23 and showed severe apnea with mixed central and obstructive apnea. A  BiPAP titration study has been ordered but is pending. Depending on titration study, he may have Mr. Arata follow-up with one of their sleep specialists. Patient has not scheduled the titration study yet. )       Anesthesia Quick Evaluation

## 2024-03-09 ENCOUNTER — Ambulatory Visit: Payer: PPO | Admitting: Orthopedic Surgery

## 2024-03-10 ENCOUNTER — Other Ambulatory Visit: Payer: Self-pay

## 2024-03-10 ENCOUNTER — Encounter (HOSPITAL_COMMUNITY): Payer: Self-pay | Admitting: General Surgery

## 2024-03-10 ENCOUNTER — Encounter (HOSPITAL_COMMUNITY)
Admission: RE | Disposition: A | Discharge status: Home / Self Care | Payer: Self-pay | Source: Ambulatory Visit | Attending: General Surgery

## 2024-03-10 ENCOUNTER — Ambulatory Visit (HOSPITAL_BASED_OUTPATIENT_CLINIC_OR_DEPARTMENT_OTHER): Payer: Self-pay | Admitting: Certified Registered Nurse Anesthetist

## 2024-03-10 ENCOUNTER — Ambulatory Visit (HOSPITAL_COMMUNITY): Payer: Self-pay | Admitting: Vascular Surgery

## 2024-03-10 ENCOUNTER — Ambulatory Visit (HOSPITAL_COMMUNITY)
Admission: RE | Admit: 2024-03-10 | Discharge: 2024-03-10 | Disposition: A | Discharge status: Home / Self Care | Source: Ambulatory Visit | Attending: General Surgery | Admitting: General Surgery

## 2024-03-10 DIAGNOSIS — Z7902 Long term (current) use of antithrombotics/antiplatelets: Secondary | ICD-10-CM | POA: Diagnosis not present

## 2024-03-10 DIAGNOSIS — I252 Old myocardial infarction: Secondary | ICD-10-CM | POA: Diagnosis not present

## 2024-03-10 DIAGNOSIS — G4731 Primary central sleep apnea: Secondary | ICD-10-CM | POA: Diagnosis not present

## 2024-03-10 DIAGNOSIS — K8010 Calculus of gallbladder with chronic cholecystitis without obstruction: Secondary | ICD-10-CM | POA: Diagnosis not present

## 2024-03-10 DIAGNOSIS — I25119 Atherosclerotic heart disease of native coronary artery with unspecified angina pectoris: Secondary | ICD-10-CM | POA: Diagnosis not present

## 2024-03-10 DIAGNOSIS — I10 Essential (primary) hypertension: Secondary | ICD-10-CM | POA: Diagnosis not present

## 2024-03-10 DIAGNOSIS — Z7985 Long-term (current) use of injectable non-insulin antidiabetic drugs: Secondary | ICD-10-CM | POA: Diagnosis not present

## 2024-03-10 DIAGNOSIS — J4489 Other specified chronic obstructive pulmonary disease: Secondary | ICD-10-CM | POA: Diagnosis not present

## 2024-03-10 DIAGNOSIS — Z955 Presence of coronary angioplasty implant and graft: Secondary | ICD-10-CM | POA: Diagnosis not present

## 2024-03-10 DIAGNOSIS — Z77090 Contact with and (suspected) exposure to asbestos: Secondary | ICD-10-CM | POA: Insufficient documentation

## 2024-03-10 DIAGNOSIS — Z79899 Other long term (current) drug therapy: Secondary | ICD-10-CM | POA: Insufficient documentation

## 2024-03-10 DIAGNOSIS — I739 Peripheral vascular disease, unspecified: Secondary | ICD-10-CM | POA: Insufficient documentation

## 2024-03-10 DIAGNOSIS — Z96653 Presence of artificial knee joint, bilateral: Secondary | ICD-10-CM | POA: Insufficient documentation

## 2024-03-10 DIAGNOSIS — G4733 Obstructive sleep apnea (adult) (pediatric): Secondary | ICD-10-CM | POA: Insufficient documentation

## 2024-03-10 DIAGNOSIS — G5600 Carpal tunnel syndrome, unspecified upper limb: Secondary | ICD-10-CM | POA: Diagnosis not present

## 2024-03-10 DIAGNOSIS — I2510 Atherosclerotic heart disease of native coronary artery without angina pectoris: Secondary | ICD-10-CM | POA: Diagnosis not present

## 2024-03-10 DIAGNOSIS — J449 Chronic obstructive pulmonary disease, unspecified: Secondary | ICD-10-CM

## 2024-03-10 HISTORY — PX: CHOLECYSTECTOMY: SHX55

## 2024-03-10 SURGERY — LAPAROSCOPIC CHOLECYSTECTOMY
Anesthesia: General

## 2024-03-10 MED ORDER — MEPERIDINE HCL 25 MG/ML IJ SOLN: 6.2500 mg | INTRAMUSCULAR | Status: DC | PRN

## 2024-03-10 MED ORDER — DEXAMETHASONE SODIUM PHOSPHATE 10 MG/ML IJ SOLN
INTRAMUSCULAR | Status: DC | PRN
  Administered 2024-03-10: 10 mg via INTRAVENOUS

## 2024-03-10 MED ORDER — SUCCINYLCHOLINE CHLORIDE 200 MG/10ML IV SOSY
PREFILLED_SYRINGE | INTRAVENOUS | Status: AC
  Filled 2024-03-10: qty 10

## 2024-03-10 MED ORDER — CEFAZOLIN SODIUM-DEXTROSE 3-4 GM/150ML-% IV SOLN
INTRAVENOUS | Status: AC
  Filled 2024-03-10: qty 150

## 2024-03-10 MED ORDER — PROPOFOL 10 MG/ML IV BOLUS
INTRAVENOUS | Status: AC
  Filled 2024-03-10: qty 20

## 2024-03-10 MED ORDER — KETAMINE HCL 50 MG/5ML IJ SOSY
PREFILLED_SYRINGE | INTRAMUSCULAR | Status: AC
  Filled 2024-03-10: qty 5

## 2024-03-10 MED ORDER — LACTATED RINGERS IV SOLN: INTRAVENOUS | Status: DC

## 2024-03-10 MED ORDER — CHLORHEXIDINE GLUCONATE 0.12 % MT SOLN
15.0000 mL | Freq: Once | OROMUCOSAL | Status: AC
  Administered 2024-03-10: 15 mL via OROMUCOSAL
  Filled 2024-03-10: qty 15

## 2024-03-10 MED ORDER — ORAL CARE MOUTH RINSE: 15.0000 mL | Freq: Once | OROMUCOSAL | Status: AC

## 2024-03-10 MED ORDER — CELECOXIB 200 MG PO CAPS
200.0000 mg | ORAL_CAPSULE | Freq: Once | ORAL | Status: AC
  Administered 2024-03-10: 200 mg via ORAL
  Filled 2024-03-10: qty 1

## 2024-03-10 MED ORDER — ACETAMINOPHEN 160 MG/5ML PO SOLN: 325.0000 mg | ORAL | Status: DC | PRN

## 2024-03-10 MED ORDER — ONDANSETRON HCL 4 MG/2ML IJ SOLN
INTRAMUSCULAR | Status: DC | PRN
  Administered 2024-03-10: 4 mg via INTRAVENOUS

## 2024-03-10 MED ORDER — CHLORHEXIDINE GLUCONATE CLOTH 2 % EX PADS: 6.0000 | MEDICATED_PAD | Freq: Once | CUTANEOUS | Status: DC

## 2024-03-10 MED ORDER — KETAMINE HCL 50 MG/5ML IJ SOSY
PREFILLED_SYRINGE | INTRAMUSCULAR | Status: DC | PRN
  Administered 2024-03-10: 40 mg via INTRAVENOUS

## 2024-03-10 MED ORDER — ACETAMINOPHEN 500 MG PO TABS
1000.0000 mg | ORAL_TABLET | Freq: Once | ORAL | Status: AC
  Administered 2024-03-10: 1000 mg via ORAL
  Filled 2024-03-10: qty 2

## 2024-03-10 MED ORDER — LIDOCAINE 2% (20 MG/ML) 5 ML SYRINGE
INTRAMUSCULAR | Status: DC | PRN
  Administered 2024-03-10: 100 mg via INTRAVENOUS

## 2024-03-10 MED ORDER — CEFAZOLIN SODIUM-DEXTROSE 3-4 GM/150ML-% IV SOLN
3.0000 g | INTRAVENOUS | Status: AC
  Administered 2024-03-10: 3 g via INTRAVENOUS

## 2024-03-10 MED ORDER — HEMOSTATIC AGENTS (NO CHARGE) OPTIME
TOPICAL | Status: DC | PRN
  Administered 2024-03-10: 1 via TOPICAL

## 2024-03-10 MED ORDER — BUPIVACAINE-EPINEPHRINE (PF) 0.25% -1:200000 IJ SOLN
INTRAMUSCULAR | Status: AC
  Filled 2024-03-10: qty 30

## 2024-03-10 MED ORDER — ROCURONIUM BROMIDE 100 MG/10ML IV SOLN
INTRAVENOUS | Status: DC | PRN
  Administered 2024-03-10: 90 mg via INTRAVENOUS
  Administered 2024-03-10: 10 mg via INTRAVENOUS

## 2024-03-10 MED ORDER — OXYCODONE HCL 5 MG PO TABS
5.0000 mg | ORAL_TABLET | Freq: Four times a day (QID) | ORAL | 0 refills | Status: DC | PRN
Start: 2024-03-10 — End: 2024-04-24

## 2024-03-10 MED ORDER — 0.9 % SODIUM CHLORIDE (POUR BTL) OPTIME
TOPICAL | Status: DC | PRN
  Administered 2024-03-10: 1000 mL

## 2024-03-10 MED ORDER — ACETAMINOPHEN 325 MG PO TABS: 325.0000 mg | ORAL_TABLET | ORAL | Status: DC | PRN

## 2024-03-10 MED ORDER — PROPOFOL 10 MG/ML IV BOLUS
INTRAVENOUS | Status: DC | PRN
  Administered 2024-03-10: 100 mg via INTRAVENOUS
  Administered 2024-03-10: 20 mg via INTRAVENOUS

## 2024-03-10 MED ORDER — OXYCODONE HCL 5 MG PO TABS
5.0000 mg | ORAL_TABLET | Freq: Once | ORAL | Status: AC | PRN
  Administered 2024-03-10: 5 mg via ORAL

## 2024-03-10 MED ORDER — OXYCODONE HCL 5 MG/5ML PO SOLN: 5.0000 mg | Freq: Once | ORAL | Status: AC | PRN

## 2024-03-10 MED ORDER — ONDANSETRON HCL 4 MG/2ML IJ SOLN: 4.0000 mg | Freq: Once | INTRAMUSCULAR | Status: DC | PRN

## 2024-03-10 MED ORDER — OXYCODONE HCL 5 MG PO TABS
ORAL_TABLET | ORAL | Status: AC
  Filled 2024-03-10: qty 1

## 2024-03-10 MED ORDER — BUPIVACAINE-EPINEPHRINE 0.25% -1:200000 IJ SOLN
INTRAMUSCULAR | Status: DC | PRN
  Administered 2024-03-10: 30 mL

## 2024-03-10 MED ORDER — SUCCINYLCHOLINE CHLORIDE 200 MG/10ML IV SOSY
PREFILLED_SYRINGE | INTRAVENOUS | Status: DC | PRN
  Administered 2024-03-10: 200 mg via INTRAVENOUS

## 2024-03-10 MED ORDER — FENTANYL CITRATE (PF) 100 MCG/2ML IJ SOLN
INTRAMUSCULAR | Status: AC
  Filled 2024-03-10: qty 2

## 2024-03-10 MED ORDER — FENTANYL CITRATE (PF) 100 MCG/2ML IJ SOLN
25.0000 ug | INTRAMUSCULAR | Status: DC | PRN
  Administered 2024-03-10: 50 ug via INTRAVENOUS

## 2024-03-10 MED ORDER — MIDAZOLAM HCL 2 MG/2ML IJ SOLN
INTRAMUSCULAR | Status: DC | PRN
  Administered 2024-03-10: 1 mg via INTRAVENOUS

## 2024-03-10 MED ORDER — FENTANYL CITRATE (PF) 250 MCG/5ML IJ SOLN
INTRAMUSCULAR | Status: AC
  Filled 2024-03-10: qty 5

## 2024-03-10 MED ORDER — MIDAZOLAM HCL 2 MG/2ML IJ SOLN
INTRAMUSCULAR | Status: AC
  Filled 2024-03-10: qty 2

## 2024-03-10 MED ORDER — SUGAMMADEX SODIUM 200 MG/2ML IV SOLN
INTRAVENOUS | Status: AC
  Filled 2024-03-10: qty 2

## 2024-03-10 MED ORDER — SODIUM CHLORIDE 0.9 % IR SOLN
Status: DC | PRN
  Administered 2024-03-10: 1000 mL

## 2024-03-10 MED ORDER — FENTANYL CITRATE (PF) 250 MCG/5ML IJ SOLN
INTRAMUSCULAR | Status: DC | PRN
  Administered 2024-03-10 (×3): 50 ug via INTRAVENOUS

## 2024-03-10 SURGICAL SUPPLY — 36 items
BAG COUNTER SPONGE SURGICOUNT (BAG) ×1 IMPLANT
BLADE CLIPPER SURG (BLADE) IMPLANT
CANISTER SUCT 3000ML PPV (MISCELLANEOUS) ×1 IMPLANT
CHLORAPREP W/TINT 26 (MISCELLANEOUS) ×1 IMPLANT
CLIP LIGATING HEMO O LOK GREEN (MISCELLANEOUS) ×1 IMPLANT
COVER SURGICAL LIGHT HANDLE (MISCELLANEOUS) ×1 IMPLANT
DERMABOND ADVANCED .7 DNX12 (GAUZE/BANDAGES/DRESSINGS) ×1 IMPLANT
ELECT REM PT RETURN 9FT ADLT (ELECTROSURGICAL) ×1 IMPLANT
ELECTRODE REM PT RTRN 9FT ADLT (ELECTROSURGICAL) ×1 IMPLANT
GLOVE BIOGEL PI IND STRL 7.0 (GLOVE) ×1 IMPLANT
GLOVE SURG SS PI 7.0 STRL IVOR (GLOVE) ×1 IMPLANT
GOWN STRL REUS W/ TWL LRG LVL3 (GOWN DISPOSABLE) ×3 IMPLANT
GRASPER SUT TROCAR 14GX15 (MISCELLANEOUS) ×1 IMPLANT
HEMOSTAT SNOW SURGICEL 2X4 (HEMOSTASIS) IMPLANT
IRRIG SUCT STRYKERFLOW 2 WTIP (MISCELLANEOUS) ×1 IMPLANT
IRRIGATION SUCT STRKRFLW 2 WTP (MISCELLANEOUS) ×1 IMPLANT
KIT BASIN OR (CUSTOM PROCEDURE TRAY) ×1 IMPLANT
KIT IMAGING PINPOINTPAQ (MISCELLANEOUS) IMPLANT
KIT TURNOVER KIT B (KITS) ×1 IMPLANT
NDL 22X1.5 STRL (OR ONLY) (MISCELLANEOUS) ×1 IMPLANT
NEEDLE 22X1.5 STRL (OR ONLY) (MISCELLANEOUS) ×1 IMPLANT
NS IRRIG 1000ML POUR BTL (IV SOLUTION) ×1 IMPLANT
PAD ARMBOARD POSITIONER FOAM (MISCELLANEOUS) ×1 IMPLANT
POUCH RETRIEVAL ECOSAC 10 (ENDOMECHANICALS) ×1 IMPLANT
SCISSORS LAP 5X35 DISP (ENDOMECHANICALS) ×1 IMPLANT
SET TUBE SMOKE EVAC HIGH FLOW (TUBING) ×1 IMPLANT
SLEEVE Z-THREAD 5X100MM (TROCAR) ×2 IMPLANT
SPECIMEN JAR SMALL (MISCELLANEOUS) ×1 IMPLANT
SUT MNCRL AB 4-0 PS2 18 (SUTURE) ×1 IMPLANT
TOWEL GREEN STERILE (TOWEL DISPOSABLE) ×1 IMPLANT
TOWEL GREEN STERILE FF (TOWEL DISPOSABLE) ×1 IMPLANT
TRAY LAPAROSCOPIC MC (CUSTOM PROCEDURE TRAY) ×1 IMPLANT
TROCAR Z THREAD OPTICAL 12X100 (TROCAR) ×1 IMPLANT
TROCAR Z-THREAD OPTICAL 5X100M (TROCAR) ×1 IMPLANT
WARMER LAPAROSCOPE (MISCELLANEOUS) ×1 IMPLANT
WATER STERILE IRR 1000ML POUR (IV SOLUTION) ×1 IMPLANT

## 2024-03-10 NOTE — Anesthesia Procedure Notes (Signed)
 Procedure Name: Intubation Date/Time: 03/10/2024 7:38 AM  Performed by: Cy Blamer, CRNAPre-anesthesia Checklist: Patient identified, Emergency Drugs available, Suction available and Patient being monitored Patient Re-evaluated:Patient Re-evaluated prior to induction Oxygen Delivery Method: Circle system utilized Preoxygenation: Pre-oxygenation with 100% oxygen Induction Type: IV induction Ventilation: Mask ventilation without difficulty Laryngoscope Size: Miller and 2 Grade View: Grade II Tube type: Oral Tube size: 7.5 mm Number of attempts: 1 Airway Equipment and Method: Stylet Placement Confirmation: ETT inserted through vocal cords under direct vision, positive ETCO2 and breath sounds checked- equal and bilateral Secured at: 22 cm Tube secured with: Tape Dental Injury: Teeth and Oropharynx as per pre-operative assessment

## 2024-03-10 NOTE — Op Note (Signed)
 PATIENT:  Brad Woods  78 y.o. male  PRE-OPERATIVE DIAGNOSIS:  CHRONIC CALCULUS CHOLECYSTITIS  POST-OPERATIVE DIAGNOSIS:  CHRONIC CALCULUS CHOLECYSTITIS  PROCEDURE:  Procedure(s): LAPAROSCOPIC CHOLECYSTECTOMY   SURGEON:  Jeris Easterly, De Blanch, MD   ASSISTANT: none  ANESTHESIA:   local and general  Indications for procedure: [name] is a 78 y.o. male with symptoms of Abdominal pain consistent with gallbladder disease, Confirmed by ultrasound.  Description of procedure: The patient was brought into the operative suite, placed supine. Anesthesia was administered with endotracheal tube. Patient was strapped in place and foot board was secured. All pressure points were offloaded by foam padding. The patient was prepped and draped in the usual sterile fashion.  A periumbilical incision was made and optical entry was used to enter the abdomen. 2 5 mm trocars were placed on in the right lateral space on in the right subcostal space. A 12mm trocar was placed in the subxiphoid space. Marcaine was infused to the subxiphoid space and lateral upper right abdomen in the transversus abdominis plane. Next the patient was placed in reverse trendelenberg. The gallbladder appearedchronically inflamed. Omentum was adhered to the gallbladder and was taken down with cautery/blunt dissection.  The gallbladder was retracted cephalad and lateral. The peritoneum was reflected off the infundibulum working lateral to medial. The cystic duct and cystic artery were identified and further dissection revealed a critical view. The cystic duct and cystic artery were doubly clipped and ligated.   The gallbladder was removed off the liver bed with cautery. The Gallbladder was placed in a specimen bag. The gallbladder fossa was irrigated and hemostasis was applied with cautery. Surgicel was put into the bed to aid in hemostasis. The gallbladder was removed via the 12mm trocar. No dilation was required for removal,  therefore no fascial closure was performed. Pneumoperitoneum was removed, all trocar were removed. All incisions were closed with 4-0 monocryl subcuticular stitch. The patient woke from anesthesia and was brought to PACU in stable condition. All counts were correct  Findings: chronic cholecystitis  Specimen: gallbladder  Blood loss: 30 ml  Local anesthesia: 30 ml Marcaine  Complications: none  PLAN OF CARE: Discharge to home after PACU  PATIENT DISPOSITION:  PACU - hemodynamically stable.  De Blanch [facility], [place]

## 2024-03-10 NOTE — H&P (Signed)
 Chief Complaint  Patient presents with  Cholelithiasis   Subjective   Brad Woods is a 78 y.o. male new patient in today for: History of Present Illness The patient, with a history of heart disease and multiple joint replacements, presents with right upper quadrant and back pain that began on a Friday night. He initially attributed the pain to a kidney stone, as he has a history of fifteen kidney stones, but the pain was higher than usual. The pain was severe enough to warrant a visit to the emergency room, where he underwent an ultrasound that revealed gallstones and sludge. The pain is almost constant, particularly when sitting, and seems to worsen after eating. He has been managing the pain with oxycodone, which he is reluctant to take. He also reports nausea, but has been able to avoid vomiting. He was prescribed anti-nausea medication, but has not yet received it from the pharmacy. He also mentions that he is on Brad Woods, which he plans to discontinue in two weeks, and will be starting [facility]. He has lost almost forty pounds on Brad Woods.  Social Drivers of Health with Concerns   Housing Stability: Unknown (02/20/2024)  Housing Stability Vital Sign  Homeless in the Last Year: No    No data to display      Outpatient Medications Prior to Visit  Medication Sig Dispense Refill  aspirin 81 MG EC tablet 1 tablet  carvediloL (COREG) 12.5 MG tablet Take 12.5 mg by mouth 2 (two) times daily  clopidogreL (PLAVIX) 75 mg tablet Take 75 mg by mouth once daily  finasteride (PROSCAR) 5 mg tablet Take 5 mg by mouth once daily  isosorbide mononitrate (IMDUR) 30 MG ER tablet Take 30 mg by mouth once daily  REPATHA SURECLICK 140 mg/mL PnIj INJECT ONE pen into THE SKIN every 14 DAYS  tamsulosin (FLOMAX) 0.4 mg capsule Take 0.4 mg by mouth once daily  semaglutide (Brad Woods SUBQ) Inject subcutaneously   No facility-administered medications prior to visit.   Review of Systems  Constitutional:  Negative.  HENT: Negative.  Eyes: Negative.  Respiratory: Negative.  Cardiovascular: Negative.  Gastrointestinal: Positive for abdominal pain and nausea.  Genitourinary: Negative.  Musculoskeletal: Negative.  Skin: Negative.  Neurological: Negative.  Endo/Heme/Allergies: Negative.  Psychiatric/Behavioral: Negative.    Objective   Vitals:  02/20/24 1412  BP: (!) 150/80  Pulse: 99  Temp: 36.4 C (97.5 F)  SpO2: 96%  Weight: (!) 125.4 kg (276 lb 6.4 oz)  Height: 185.4 cm (6\' 1" )  PainSc: 4   Body mass index is 36.47 kg/m. Physical Exam Constitutional:  Appearance: Normal appearance.  HENT:  Head: Normocephalic and atraumatic.  Pulmonary:  Effort: Pulmonary effort is normal.  Abdominal:  Comments: Tender RUQ  Musculoskeletal:  General: Normal range of motion.  Cervical back: Normal range of motion.  Neurological:  General: No focal deficit present.  Mental Status: He is alert and oriented to person, place, and time. Mental status is at baseline.  Psychiatric:  Mood and Affect: Mood normal.  Behavior: Behavior normal.  Thought Content: Thought content normal.     I reviewed [place] images showing multiple medium and large gallstones, borderline wall thickening  Assessment/Plan:   Assessment & Plan Cholelithiasis Patient presents with recurrent right upper quadrant pain, radiating to the back, associated with eating. Ultrasound confirmed gallstones and sludge. No prior abdominal surgeries. -Plan for laparoscopic cholecystectomy. We discussed the etiology of gallstones and probably cause pain. We discussed exacerbating factors including fatty meals. We discussed the details of  surgery for removal of the gallbladder including general anesthesia, 4 small incisions in the patient's abdomen, removal of the patient's gallbladder with the liver and common bile duct, and most likely outpatient procedure. We discussed risks of common bile duct injury, cystic duct stump leak,  injury to liver, bleeding, infection, need for open procedure, and post cholecystectomy syndrome. The patient showed good understanding and wanted to proceed with laparoscopic cholecystectomy.   -Obtain clearance from cardiologist due to history of heart attack and stent placement. -Advise patient to continue managing pain with prescribed oxycodone as needed, avoid ibuprofen due to concurrent use of Plavix. -Recommend low-fat diet to potentially alleviate symptoms.  Cardiovascular disease History of heart attack with stent placement. Currently on blood thinners. -Obtain clearance from cardiologist for planned cholecystectomy.  Carpal tunnel syndrome Upcoming surgery in April. -Ensure adequate recovery time between cholecystectomy and carpal tunnel surgery.  Weight management Patient has lost significant weight with the help of Brad Woods, plans to discontinue medication soon. -Encourage continued healthy lifestyle habits for weight maintenance.

## 2024-03-10 NOTE — Discharge Instructions (Signed)
 [facility] ______[facility] LAPAROSCOPIC SURGERY: POST OP INSTRUCTIONS Always review your discharge instruction sheet given to you by the facility where your surgery was performed. IF YOU HAVE DISABILITY OR FAMILY LEAVE FORMS, YOU MUST BRING THEM TO THE OFFICE FOR PROCESSING.   DO NOT GIVE THEM TO YOUR DOCTOR.  A prescription for pain medication may be given to you upon discharge.  Take your pain medication as prescribed, if needed.  If narcotic pain medicine is not needed, then you may take acetaminophen (Tylenol) or ibuprofen (Advil) as needed. Take your usually prescribed medications unless otherwise directed. If you need a refill on your pain medication, please contact your pharmacy.  They will contact our office to request authorization. Prescriptions will not be filled after 5pm or on week-ends. You should follow a light diet the first few days after arrival home, such as soup and crackers, etc.  Be sure to include lots of fluids daily. Most patients will experience some swelling and bruising in the area of the incisions.  Ice packs will help.  Swelling and bruising can take several days to resolve.  It is common to experience some constipation if taking pain medication after surgery.  Increasing fluid intake and taking a stool softener (such as Colace) will usually help or prevent this problem from occurring.  A mild laxative (Milk of Magnesia or Miralax) should be taken according to package instructions if there are no bowel movements after 48 hours. Unless discharge instructions indicate otherwise, you may remove your bandages 24-48 hours after surgery, and you may shower at that time.  You may have steri-strips (small skin tapes) in place directly over the incision.  These strips should be left on the skin for 7-10 days.  If your surgeon used skin glue on the incision, you may shower in 24 hours.  The glue will flake off over the next 2-3 weeks.  Any sutures or staples will be  removed at the office during your follow-up visit. ACTIVITIES:  You may resume regular (light) daily activities beginning the next day--such as daily self-care, walking, climbing stairs--gradually increasing activities as tolerated.  You may have sexual intercourse when it is comfortable.  Refrain from any heavy lifting or straining until approved by your doctor. You may drive when you are no longer taking prescription pain medication, you can comfortably wear a seatbelt, and you can safely maneuver your car and apply brakes. RETURN TO WORK:  __________________________________________________________ Bonita Quin should see your doctor in the office for a follow-up appointment approximately 2-3 weeks after your surgery.  Make sure that you call for this appointment within a day or two after you arrive home to insure a convenient appointment time. OTHER INSTRUCTIONS: __________________________________________________________________________________________________________________________ __________________________________________________________________________________________________________________________ WHEN TO CALL YOUR DOCTOR: Fever over 101.0 Inability to urinate Continued bleeding from incision. Increased pain, redness, or drainage from the incision. Increasing abdominal pain  The clinic staff is available to answer your questions during regular business hours.  Please don't hesitate to call and ask to speak to one of the nurses for clinical concerns.  If you have a medical emergency, go to the nearest emergency room or call 911.  A surgeon from [facility] is always on call at the hospital. [address], [address], [place], [place]  [address] ? [address] [address], [place], [place]   [address] [phone] ? [phone] ? FAX [phone] Web site: [email or web address]

## 2024-03-10 NOTE — Anesthesia Postprocedure Evaluation (Signed)
 Anesthesia Post Note  Patient: Brad Woods  Procedure(s) Performed: LAPAROSCOPIC CHOLECYSTECTOMY     Patient location during evaluation: PACU Anesthesia Type: General Level of consciousness: awake and alert Pain management: pain level controlled Vital Signs Assessment: post-procedure vital signs reviewed and stable Respiratory status: spontaneous breathing, nonlabored ventilation, respiratory function stable and patient connected to nasal cannula oxygen Cardiovascular status: blood pressure returned to baseline and stable Postop Assessment: no apparent nausea or vomiting Anesthetic complications: no   No notable events documented.  Last Vitals:  Vitals:   03/10/24 0915 03/10/24 0930  BP: 138/67 134/65  Pulse: (!) 55 (!) 57  Resp: 13 15  Temp:  36.7 C  SpO2: 98% 99%    Last Pain:  Vitals:   03/10/24 0915  TempSrc:   PainSc: 4                  Ricardo Kayes

## 2024-03-10 NOTE — Transfer of Care (Signed)
 Immediate Anesthesia Transfer of Care Note  Patient: Brad Woods  Procedure(s) Performed: LAPAROSCOPIC CHOLECYSTECTOMY  Patient Location: PACU  Anesthesia Type:General  Level of Consciousness: awake, alert , and oriented  Airway & Oxygen Therapy: Patient Spontanous Breathing and Patient connected to face mask oxygen  Post-op Assessment: Report given to RN, Post -op Vital signs reviewed and stable, Patient moving all extremities X 4, and Patient able to stick tongue midline  Post vital signs: Reviewed and stable  Last Vitals:  Vitals Value Taken Time  BP 155/85 03/10/24 0837  Temp 36.5 C 03/10/24 0837  Pulse 62 03/10/24 0840  Resp 15 03/10/24 0840  SpO2 95 % 03/10/24 0840  Vitals shown include unfiled device data.  Last Pain:  Vitals:   03/10/24 0837  TempSrc:   PainSc: Asleep         Complications: No notable events documented.

## 2024-03-11 ENCOUNTER — Encounter (HOSPITAL_COMMUNITY): Payer: Self-pay | Admitting: General Surgery

## 2024-03-11 LAB — SURGICAL PATHOLOGY

## 2024-03-15 ENCOUNTER — Emergency Department (HOSPITAL_BASED_OUTPATIENT_CLINIC_OR_DEPARTMENT_OTHER)

## 2024-03-15 ENCOUNTER — Emergency Department (HOSPITAL_BASED_OUTPATIENT_CLINIC_OR_DEPARTMENT_OTHER)
Admission: EM | Admit: 2024-03-15 | Discharge: 2024-03-15 | Disposition: A | Discharge status: Home / Self Care | Attending: Emergency Medicine | Admitting: Emergency Medicine

## 2024-03-15 ENCOUNTER — Encounter (HOSPITAL_BASED_OUTPATIENT_CLINIC_OR_DEPARTMENT_OTHER): Payer: Self-pay

## 2024-03-15 DIAGNOSIS — N281 Cyst of kidney, acquired: Secondary | ICD-10-CM | POA: Diagnosis not present

## 2024-03-15 DIAGNOSIS — Z7902 Long term (current) use of antithrombotics/antiplatelets: Secondary | ICD-10-CM | POA: Insufficient documentation

## 2024-03-15 DIAGNOSIS — N289 Disorder of kidney and ureter, unspecified: Secondary | ICD-10-CM | POA: Diagnosis not present

## 2024-03-15 DIAGNOSIS — Z96611 Presence of right artificial shoulder joint: Secondary | ICD-10-CM | POA: Diagnosis not present

## 2024-03-15 DIAGNOSIS — G8918 Other acute postprocedural pain: Secondary | ICD-10-CM | POA: Diagnosis not present

## 2024-03-15 DIAGNOSIS — I10 Essential (primary) hypertension: Secondary | ICD-10-CM | POA: Diagnosis not present

## 2024-03-15 DIAGNOSIS — R140 Abdominal distension (gaseous): Secondary | ICD-10-CM | POA: Diagnosis not present

## 2024-03-15 DIAGNOSIS — Z7982 Long term (current) use of aspirin: Secondary | ICD-10-CM | POA: Insufficient documentation

## 2024-03-15 DIAGNOSIS — R079 Chest pain, unspecified: Secondary | ICD-10-CM | POA: Diagnosis not present

## 2024-03-15 DIAGNOSIS — R109 Unspecified abdominal pain: Secondary | ICD-10-CM | POA: Diagnosis not present

## 2024-03-15 DIAGNOSIS — R1084 Generalized abdominal pain: Secondary | ICD-10-CM | POA: Insufficient documentation

## 2024-03-15 DIAGNOSIS — R112 Nausea with vomiting, unspecified: Secondary | ICD-10-CM | POA: Diagnosis not present

## 2024-03-15 DIAGNOSIS — R7881 Bacteremia: Secondary | ICD-10-CM | POA: Diagnosis not present

## 2024-03-15 LAB — CBC WITH DIFFERENTIAL/PLATELET
Abs Immature Granulocytes: 0.02 10*3/uL (ref 0.00–0.07)
Basophils Absolute: 0 10*3/uL (ref 0.0–0.1)
Basophils Relative: 1 %
Eosinophils Absolute: 0.2 10*3/uL (ref 0.0–0.5)
Eosinophils Relative: 3 %
HCT: 43.3 % (ref 39.0–52.0)
Hemoglobin: 15.2 g/dL (ref 13.0–17.0)
Immature Granulocytes: 0 %
Lymphocytes Relative: 18 %
Lymphs Abs: 1 10*3/uL (ref 0.7–4.0)
MCH: 29.8 pg (ref 26.0–34.0)
MCHC: 35.1 g/dL (ref 30.0–36.0)
MCV: 84.9 fL (ref 80.0–100.0)
Monocytes Absolute: 0.5 10*3/uL (ref 0.1–1.0)
Monocytes Relative: 10 %
Neutro Abs: 3.9 10*3/uL (ref 1.7–7.7)
Neutrophils Relative %: 68 %
Platelets: 166 10*3/uL (ref 150–400)
RBC: 5.1 MIL/uL (ref 4.22–5.81)
RDW: 14 % (ref 11.5–15.5)
WBC: 5.6 10*3/uL (ref 4.0–10.5)
nRBC: 0 % (ref 0.0–0.2)

## 2024-03-15 LAB — URINALYSIS, ROUTINE W REFLEX MICROSCOPIC
Bilirubin Urine: NEGATIVE
Glucose, UA: NEGATIVE mg/dL
Hgb urine dipstick: NEGATIVE
Ketones, ur: NEGATIVE mg/dL
Leukocytes,Ua: NEGATIVE
Nitrite: NEGATIVE
Protein, ur: NEGATIVE mg/dL
Specific Gravity, Urine: <=1.005 (ref 1.005–1.030)
pH: 5.5 (ref 5.0–8.0)

## 2024-03-15 LAB — COMPREHENSIVE METABOLIC PANEL
ALT: 116 U/L — ABNORMAL HIGH (ref 0–44)
AST: 25 U/L (ref 15–41)
Albumin: 3.5 g/dL (ref 3.5–5.0)
Alkaline Phosphatase: 81 U/L (ref 38–126)
Anion gap: 10 (ref 5–15)
BUN: 15 mg/dL (ref 8–23)
CO2: 23 mmol/L (ref 22–32)
Calcium: 8.9 mg/dL (ref 8.9–10.3)
Chloride: 103 mmol/L (ref 98–111)
Creatinine, Ser: 0.84 mg/dL (ref 0.61–1.24)
GFR, Estimated: >60 mL/min (ref >60)
Glucose, Bld: 114 mg/dL — ABNORMAL HIGH (ref 70–99)
Potassium: 3.5 mmol/L (ref 3.5–5.1)
Sodium: 136 mmol/L (ref 135–145)
Total Bilirubin: 0.9 mg/dL (ref 0.0–1.2)
Total Protein: 6.9 g/dL (ref 6.5–8.1)

## 2024-03-15 LAB — LIPASE, BLOOD: Lipase: 38 U/L (ref 11–51)

## 2024-03-15 MED ORDER — FENTANYL CITRATE PF 50 MCG/ML IJ SOSY
50.0000 ug | PREFILLED_SYRINGE | Freq: Once | INTRAMUSCULAR | Status: AC
  Administered 2024-03-15: 50 ug via INTRAVENOUS
  Filled 2024-03-15: qty 1

## 2024-03-15 MED ORDER — AMOXICILLIN-POT CLAVULANATE 875-125 MG PO TABS: 1.0000 | ORAL_TABLET | Freq: Two times a day (BID) | ORAL | 0 refills | Status: DC

## 2024-03-15 MED ORDER — IOHEXOL 300 MG/ML  SOLN
100.0000 mL | Freq: Once | INTRAMUSCULAR | Status: AC | PRN
Start: 2024-03-15 — End: 2024-03-15
  Administered 2024-03-15: 100 mL via INTRAVENOUS

## 2024-03-15 MED ORDER — ONDANSETRON HCL 4 MG/2ML IJ SOLN
4.0000 mg | Freq: Once | INTRAMUSCULAR | Status: AC
  Administered 2024-03-15: 4 mg via INTRAVENOUS
  Filled 2024-03-15: qty 2

## 2024-03-15 MED ORDER — SODIUM CHLORIDE 0.9 % IV SOLN
2.0000 g | Freq: Once | INTRAVENOUS | Status: AC
  Administered 2024-03-15: 2 g via INTRAVENOUS
  Filled 2024-03-15: qty 20

## 2024-03-15 MED ORDER — PIPERACILLIN-TAZOBACTAM 3.375 G IVPB 30 MIN: 3.3750 g | Freq: Once | INTRAVENOUS | Status: DC

## 2024-03-15 MED ORDER — SODIUM CHLORIDE 0.9 % IV BOLUS
1000.0000 mL | Freq: Once | INTRAVENOUS | Status: AC
  Administered 2024-03-15: 1000 mL via INTRAVENOUS

## 2024-03-15 MED ORDER — SODIUM CHLORIDE 0.9 % IV SOLN: INTRAVENOUS | Status: DC | PRN

## 2024-03-15 NOTE — Progress Notes (Signed)
 ED Pharmacy Antibiotic Sign Off An antibiotic consult was received from an ED provider for zosyn per pharmacy dosing for intra-abdominal infection. A chart review was completed to assess appropriateness.   The following one time order(s) were placed:  Zosyn 3.375g x1   Further antibiotic and/or antibiotic pharmacy consults should be ordered by the admitting provider if indicated.   Thank you for allowing pharmacy to be a part of this patient's care.   Estill Batten, PharmD, BCCCP  Clinical Pharmacist 03/15/24 1:12 PM

## 2024-03-15 NOTE — ED Provider Notes (Signed)
 [name] Provider Note   CSN: [id number] Arrival date & time: 03/15/24  1046     History  Chief Complaint  Patient presents with   Abdominal Pain    Brad Woods is a 78 y.o. male.  Patient here with worsening abdominal pain since yesterday.  Has gallbladder removed about 5 days ago.  Has increased swelling to the right side of his abdomen nausea and vomiting.  He says he has been passing gas having bowel movements.  He has advance his diet here the last 2 days.  Ate some sweet and sour chicken last night.  He took the oxycodone prior to coming here.  He has not noticed any fevers.  Denies any chest pain shortness of breath cough sputum production or pain with urination.  The history is provided by the patient.       Home Medications Prior to Admission medications   Medication Sig Start Date End Date Taking? Authorizing Provider  amoxicillin-clavulanate (AUGMENTIN) 875-125 MG tablet Take 1 tablet by mouth every 12 (twelve) hours. 03/15/24  Yes Wave Calzada, DO  albuterol (VENTOLIN HFA) 108 (90 Base) MCG/ACT inhaler Inhale 2 puffs into the lungs every 6 (six) hours as needed for wheezing or shortness of breath. 10/03/23   Hunsucker, Lesia Sago, MD  aspirin EC 81 MG EC tablet Take 1 tablet (81 mg total) by mouth daily. 05/22/16   Orpah Cobb, MD  carvedilol (COREG) 12.5 MG tablet Take 1 tablet (12.5 mg total) by mouth 2 (two) times daily. 10/01/23   Denyce Robert, NP  clopidogrel (PLAVIX) 75 MG tablet Take 1 tablet (75 mg total) by mouth daily. 10/01/23   Denyce Robert, NP  docusate sodium (COLACE) 100 MG capsule Take 1 capsule (100 mg total) by mouth 2 (two) times daily. Patient not taking: Reported on 03/03/2024 10/23/23   Magnant, Joycie Peek, PA-C  Evolocumab (REPATHA SURECLICK) 140 MG/ML SOAJ Inject 140 mg into the skin every 14 (fourteen) days. 02/19/24   Nahser, Deloris Ping, MD  finasteride (PROSCAR) 5 MG tablet Take 5  mg by mouth daily.    [provider]  fluticasone (FLONASE) 50 MCG/ACT nasal spray Place 2 sprays into the nose daily as needed for allergies.  12/07/13   [provider]  Fluticasone-Umeclidin-Vilant (TRELEGY ELLIPTA) 200-62.5-25 MCG/ACT AEPB Inhale 1 puff into the lungs daily. Patient taking differently: Inhale 1 puff into the lungs daily as needed (shortness of breath). 10/03/23   Hunsucker, Lesia Sago, MD  gabapentin (NEURONTIN) 300 MG capsule Take 1 capsule (300 mg total) by mouth 3 (three) times daily. Patient not taking: Reported on 03/03/2024 12/16/23   Cammy Copa, MD  isosorbide mononitrate (IMDUR) 60 MG 24 hr tablet Take 1 tablet (60 mg total) by mouth daily. 10/01/23 03/10/24  Denyce Robert, NP  Multiple Vitamins-Minerals (CENTRUM SILVER 50+MEN PO) Take 1 tablet by mouth daily.    [provider]  nitroGLYCERIN (NITROSTAT) 0.4 MG SL tablet Dissolve 1 tablet under the tongue every 5 minutes as needed for chest pain. Max of 3 doses, then 911. 07/09/23   Nahser, Deloris Ping, MD  ondansetron (ZOFRAN-ODT) 4 MG disintegrating tablet Take 1 tablet (4 mg total) by mouth every 8 (eight) hours as needed for nausea or vomiting. 02/15/24   Roxy Horseman, PA-C  oxyCODONE (OXY IR/ROXICODONE) 5 MG immediate release tablet Take 1 tablet (5 mg total) by mouth every 6 (six) hours as needed for severe pain (pain score  7-10). 03/10/24   Kinsinger, De Blanch, MD  oxyCODONE-acetaminophen (PERCOCET) 5-325 MG tablet Take 1-2 tablets by mouth every 6 (six) hours as needed. Patient not taking: Reported on 03/03/2024 02/15/24   Roxy Horseman, PA-C  tamsulosin (FLOMAX) 0.4 MG CAPS capsule Take 0.4 mg by mouth at bedtime.  08/14/16   [provider]      Allergies    Hydromorphone hcl, Hydromorphone, and Aleve [naproxen sodium]    Review of Systems   Review of Systems  Physical Exam Updated Vital Signs BP (!) 152/73 (BP Location: Right Arm)   Pulse 84   Temp (!)  97.5 F (36.4 C) (Oral)   Resp 18   Ht 6\' 1"  (1.854 m)   Wt 122.5 kg   SpO2 99%   BMI 35.62 kg/m  Physical Exam Vitals and nursing note reviewed.  Constitutional:      General: He is not in acute distress.    Appearance: He is well-developed. He is not ill-appearing.  HENT:     Head: Normocephalic and atraumatic.     Mouth/Throat:     Mouth: Mucous membranes are moist.  Eyes:     Extraocular Movements: Extraocular movements intact.     Conjunctiva/sclera: Conjunctivae normal.     Pupils: Pupils are equal, round, and reactive to light.  Cardiovascular:     Rate and Rhythm: Normal rate and regular rhythm.     Heart sounds: Normal heart sounds. No murmur heard. Pulmonary:     Effort: Pulmonary effort is normal. No respiratory distress.     Breath sounds: Normal breath sounds.  Abdominal:     General: There is distension.     Palpations: Abdomen is soft.     Tenderness: There is generalized abdominal tenderness.  Musculoskeletal:        General: No swelling.     Cervical back: Neck supple.  Skin:    General: Skin is warm and dry.     Capillary Refill: Capillary refill takes less than 2 seconds.     Comments: Surgical sites is clear dry and intact  Neurological:     Mental Status: He is alert.  Psychiatric:        Mood and Affect: Mood normal.     ED Results / Procedures / Treatments   Labs (all labs ordered are listed, but only abnormal results are displayed) Labs Reviewed  COMPREHENSIVE METABOLIC PANEL - Abnormal; Notable for the following components:      Result Value   Glucose, Bld 114 (*)    ALT 116 (*)    All other components within normal limits  CULTURE, BLOOD (ROUTINE X 2)  CULTURE, BLOOD (ROUTINE X 2)  CBC WITH DIFFERENTIAL/PLATELET  LIPASE, BLOOD  URINALYSIS, ROUTINE W REFLEX MICROSCOPIC    EKG EKG Interpretation Date/Time:  Sunday March 15 2024 11:30:43 EDT Ventricular Rate:  82 PR Interval:  184 QRS Duration:  152 QT Interval:  412 QTC  Calculation: 482 R Axis:   -30  Text Interpretation: Sinus rhythm Right bundle branch block Confirmed by Virgina Norfolk [phone]) on 03/15/2024 12:03:18 PM  Radiology CT ABDOMEN PELVIS W CONTRAST Result Date: 03/15/2024 CLINICAL DATA:  Postop abdominal pain, cholecystectomy 5 days ago. EXAM: CT ABDOMEN AND PELVIS WITH CONTRAST TECHNIQUE: Multidetector CT imaging of the abdomen and pelvis was performed using the standard protocol following bolus administration of intravenous contrast. RADIATION DOSE REDUCTION: This exam was performed according to the departmental dose-optimization program which includes automated exposure control, adjustment of the mA and/or  kV according to patient size and/or use of iterative reconstruction technique. CONTRAST:  OMNIPAQUE IOHEXOL 300 MG/ML  SOLN COMPARISON:  04/02/2021. FINDINGS: Lower chest: Mild basilar subpleural reticular densities, right greater than left. Heart is at the upper limits of normal in size to mildly enlarged. No pericardial or pleural effusion. Distal esophagus is grossly unremarkable. Hepatobiliary: Liver is unremarkable. Cholecystectomy. A collection of fluid and air in the gallbladder fossa measures 3.7 x 4.3 cm (301/33). Mild surrounding inflammatory haziness and stranding. No biliary ductal dilatation. Pancreas: Negative. Spleen: Negative. Adrenals/Urinary Tract: Adrenal glands are unremarkable. Right renal cyst. Small low-attenuation lesions in the left kidney, too small to characterize. No specific follow-up necessary. Kidneys are otherwise unremarkable. Ureters are decompressed. Bladder is low in volume. Stomach/Bowel: Stomach, small bowel and colon are unremarkable. Appendix is not readily visualized. Vascular/Lymphatic: Atherosclerotic calcification of the aorta. No pathologically enlarged lymph nodes. Reproductive: Prostate is visualized. Other: No free fluid. Musculoskeletal: Degenerative changes in the spine. IMPRESSION: 1. Possible  developing abscess in the gallbladder fossa, status post cholecystectomy 5 days ago. 2. 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 If further evaluation is desired, nonemergent high resolution chest CT without contrast could be performed. 3.  Aortic atherosclerosis (ICD10-I70.0). Electronically Signed   By: Leanna Battles M.D.   On: 03/15/2024 13:05   DG Chest Portable 1 View Result Date: 03/15/2024 CLINICAL DATA:  Mid back pain EXAM: PORTABLE CHEST 1 VIEW COMPARISON:  Chest radiograph dated 02/14/2024 FINDINGS: Normal lung volumes. No focal consolidations. No pleural effusion or pneumothorax. The heart size and mediastinal contours are within normal limits. Partially imaged right shoulder arthroplasty. IMPRESSION: No acute disease. Electronically Signed   By: Agustin Cree M.D.   On: 03/15/2024 12:45    Procedures Procedures    Medications Ordered in ED Medications  cefTRIAXone (ROCEPHIN) 2 g in sodium chloride 0.9 % 100 mL IVPB (has no administration in time range)  fentaNYL (SUBLIMAZE) injection 50 mcg (50 mcg Intravenous Given 03/15/24 1143)  ondansetron (ZOFRAN) injection 4 mg (4 mg Intravenous Given 03/15/24 1141)  sodium chloride 0.9 % bolus 1,000 mL (0 mLs Intravenous Stopped 03/15/24 1310)  iohexol (OMNIPAQUE) 300 MG/ML solution 100 mL (100 mLs Intravenous Contrast Given 03/15/24 1233)    ED Course/ Medical Decision Making/ A&P                                 Medical Decision Making Amount and/or Complexity of Data Reviewed Labs: ordered. Radiology: ordered.  Risk Prescription drug management. Decision regarding hospitalization.   Mitchelle A Tomey is here with abdominal pain status post cholecystectomy 5 days ago.  He was doing well started to have pain increased yesterday nausea and vomiting this morning.  Increase swelling to the belly.  He is having pain in the back.  He has been having good bowel movements passing gas.  He is advance his diet here the last few days.  Eating  sweet and sour chicken salad yesterday.  Took oxycodone prior to coming here.  He talked with surgeon who referred him to the ED.  He has no fever normal vitals.  He looks a little bit uncomfortable.  He is distended on exam with generalized tenderness.  Surgical site is clean dry and intact.  Ultimately with diagnosis could be postop complication or ileus or expected postop pain.  Ultimately will give IV fluids IV Zofran IV fentanyl check basic labs including CBC CMP lipase urinalysis chest  x-ray send blood culture and get a CT scan abdomen pelvis.  Per my review and interpretation the labs no significant leukocytosis anemia or electrolyte abnormality.  Gallbladder and liver enzymes within normal limits.  No white count.  CT scan per radiology report with question of may be abscess in the gallbladder fossa but otherwise labs unremarkable imaging unremarkable.  I talked with Dr. Magnus Ivan who with general surgery who reviewed the films and do not believe that this is a abscess especially given the clinical situation with no fever or white count.  He thinks that this is likely normal postop finding after reviewing surgeon note.  He recommends a dose of IV Rocephin here in the ED and continue the patient outpatient with Augmentin and will arrange for follow-up with his surgeon this week.  Patient was told about this as well agrees with the plan.  Understands return precautions including worsening pain fever.  Patient discharged.  This chart was dictated using voice recognition software.  Despite best efforts to proofread,  errors can occur which can change the documentation meaning.         Final Clinical Impression(s) / ED Diagnoses Final diagnoses:  Postoperative pain    Rx / DC Orders ED Discharge Orders          Ordered    amoxicillin-clavulanate (AUGMENTIN) 875-125 MG tablet  Every 12 hours        03/15/24 1330              [place], Madelaine Bhat, DO 03/15/24 1331

## 2024-03-15 NOTE — Discharge Instructions (Addendum)
 Continue pain management as home as prescribed by your surgeon.  Continue next dose antibiotic tomorrow as prescribed by me.  Call your surgeon tomorrow for follow-up appointment.  If you develop worsening pain especially fever greater than 100.4 recommend you return for evaluation.

## 2024-03-15 NOTE — ED Triage Notes (Signed)
 States had cholecystectomy on Tuesday at [facility], has had pain but worsened yesterday. C/o swelling to right abdomen, vomiting today. Pain to middle of back. Called surgeon and told to come in to r/o infection.  Took oxycodone at 1000

## 2024-03-16 LAB — BLOOD CULTURE ID PANEL (REFLEXED) - BCID2

## 2024-03-17 LAB — CULTURE, BLOOD (ROUTINE X 2)

## 2024-03-18 ENCOUNTER — Telehealth (HOSPITAL_BASED_OUTPATIENT_CLINIC_OR_DEPARTMENT_OTHER): Payer: Self-pay

## 2024-03-18 NOTE — Telephone Encounter (Signed)
 Post ED Visit - Positive Culture Follow-up  Culture report reviewed by antimicrobial stewardship pharmacist: [facility] [x]  Wilmer Floor, Pharm.D. []  Celedonio Miyamoto, Pharm.D., BCPS AQ-ID []  Garvin Fila, Pharm.D., BCPS []  Georgina Pillion, Pharm.D., BCPS []  [place], [address].D., BCPS, AAHIVP []  Estella Husk, Pharm.D., BCPS, AAHIVP []  Lysle Pearl, PharmD, BCPS []  Phillips Climes, PharmD, BCPS []  Agapito Games, PharmD, BCPS []  Verlan Friends, PharmD []  Mervyn Gay, PharmD, BCPS []  Vinnie Level, PharmD  [facility] []  Len Childs, PharmD []  Greer Pickerel, PharmD []  Adalberto Cole, PharmD []  Perlie Gold, Rph []  Lonell Face) Jean Rosenthal, PharmD []  Earl Many, PharmD []  Junita Push, PharmD []  Dorna Leitz, PharmD []  Terrilee Files, PharmD []  Lynann Beaver, PharmD []  Keturah Barre, PharmD []  Loralee Pacas, PharmD []  Bernadene Person, PharmD   Positive blood culture Treated with Amoxicillin, organism sensitive to the same and no further patient follow-up is required at this time.  Sandria Senter 03/18/2024, 2:10 PM

## 2024-03-19 ENCOUNTER — Encounter: Payer: PPO | Admitting: Orthopedic Surgery

## 2024-03-25 ENCOUNTER — Encounter (HOSPITAL_BASED_OUTPATIENT_CLINIC_OR_DEPARTMENT_OTHER): Payer: PPO | Admitting: Pulmonary Disease

## 2024-04-01 ENCOUNTER — Ambulatory Visit: Payer: PPO | Admitting: Pulmonary Disease

## 2024-04-15 DIAGNOSIS — H53042 Amblyopia suspect, left eye: Secondary | ICD-10-CM | POA: Diagnosis not present

## 2024-04-15 DIAGNOSIS — H31091 Other chorioretinal scars, right eye: Secondary | ICD-10-CM | POA: Diagnosis not present

## 2024-04-15 DIAGNOSIS — H43813 Vitreous degeneration, bilateral: Secondary | ICD-10-CM | POA: Diagnosis not present

## 2024-04-15 DIAGNOSIS — H2513 Age-related nuclear cataract, bilateral: Secondary | ICD-10-CM | POA: Diagnosis not present

## 2024-04-16 ENCOUNTER — Encounter: Payer: PPO | Admitting: Orthopedic Surgery

## 2024-04-20 ENCOUNTER — Encounter (HOSPITAL_BASED_OUTPATIENT_CLINIC_OR_DEPARTMENT_OTHER): Payer: Self-pay | Admitting: Orthopedic Surgery

## 2024-04-20 ENCOUNTER — Other Ambulatory Visit: Payer: Self-pay

## 2024-04-23 ENCOUNTER — Other Ambulatory Visit: Payer: Self-pay | Admitting: Orthopedic Surgery

## 2024-04-23 DIAGNOSIS — G5621 Lesion of ulnar nerve, right upper limb: Secondary | ICD-10-CM

## 2024-04-23 NOTE — Anesthesia Preprocedure Evaluation (Addendum)
 Anesthesia Evaluation  Patient identified by MRN, date of birth, ID band Patient awake    Reviewed: Allergy & Precautions, NPO status , Patient's Chart, lab work & pertinent test results, reviewed documented beta blocker date and time   History of Anesthesia Complications Negative for: history of anesthetic complications  Airway Mallampati: II  TM Distance: >3 FB Neck ROM: Full    Dental  (+) Dental Advisory Given, Partial Upper   Pulmonary asthma , sleep apnea (noncompliant) , COPD,  COPD inhaler   Pulmonary exam normal        Cardiovascular hypertension, Pt. on medications and Pt. on home beta blockers (-) angina + CAD, + Past MI, + Cardiac Stents and + Peripheral Vascular Disease  Normal cardiovascular exam   '21 TTE  - EF 55 to 60%. Aortic valve regurgitation is trivial.     Neuro/Psych  Headaches  Neuromuscular disease  negative psych ROS   GI/Hepatic Neg liver ROS,GERD  Controlled,,  Endo/Other  Hypothyroidism   Obesity   Renal/GU negative Renal ROS     Musculoskeletal  (+) Arthritis ,    Abdominal   Peds  Hematology negative hematology ROS (+)   Anesthesia Other Findings   Reproductive/Obstetrics                             Anesthesia Physical Anesthesia Plan  ASA: 3  Anesthesia Plan: General   Post-op Pain Management: Tylenol  PO (pre-op)*   Induction: Intravenous  PONV Risk Score and Plan: 2 and Treatment may vary due to age or medical condition, Ondansetron  and Propofol  infusion  Airway Management Planned: LMA  Additional Equipment: None  Intra-op Plan:   Post-operative Plan: Extubation in OR  Informed Consent: I have reviewed the patients History and Physical, chart, labs and discussed the procedure including the risks, benefits and alternatives for the proposed anesthesia with the patient or authorized representative who has indicated his/her understanding and  acceptance.     Dental advisory given  Plan Discussed with: CRNA, Anesthesiologist and Surgeon  Anesthesia Plan Comments:        Anesthesia Quick Evaluation

## 2024-04-24 ENCOUNTER — Other Ambulatory Visit: Payer: Self-pay

## 2024-04-24 ENCOUNTER — Ambulatory Visit (HOSPITAL_BASED_OUTPATIENT_CLINIC_OR_DEPARTMENT_OTHER)
Admission: RE | Admit: 2024-04-24 | Discharge: 2024-04-24 | Disposition: A | Discharge status: Home / Self Care | Payer: PPO | Attending: Orthopedic Surgery | Admitting: Orthopedic Surgery

## 2024-04-24 ENCOUNTER — Ambulatory Visit (HOSPITAL_BASED_OUTPATIENT_CLINIC_OR_DEPARTMENT_OTHER): Payer: Self-pay | Admitting: Anesthesiology

## 2024-04-24 ENCOUNTER — Encounter (HOSPITAL_BASED_OUTPATIENT_CLINIC_OR_DEPARTMENT_OTHER)
Admission: RE | Disposition: A | Discharge status: Home / Self Care | Payer: Self-pay | Source: Home / Self Care | Attending: Orthopedic Surgery

## 2024-04-24 ENCOUNTER — Encounter (HOSPITAL_BASED_OUTPATIENT_CLINIC_OR_DEPARTMENT_OTHER): Payer: Self-pay | Admitting: Orthopedic Surgery

## 2024-04-24 ENCOUNTER — Telehealth: Payer: Self-pay | Admitting: Orthopedic Surgery

## 2024-04-24 DIAGNOSIS — I721 Aneurysm of artery of upper extremity: Secondary | ICD-10-CM | POA: Diagnosis not present

## 2024-04-24 DIAGNOSIS — M65351 Trigger finger, right little finger: Secondary | ICD-10-CM | POA: Diagnosis not present

## 2024-04-24 DIAGNOSIS — I2510 Atherosclerotic heart disease of native coronary artery without angina pectoris: Secondary | ICD-10-CM | POA: Insufficient documentation

## 2024-04-24 DIAGNOSIS — J449 Chronic obstructive pulmonary disease, unspecified: Secondary | ICD-10-CM | POA: Insufficient documentation

## 2024-04-24 DIAGNOSIS — Z79899 Other long term (current) drug therapy: Secondary | ICD-10-CM | POA: Diagnosis not present

## 2024-04-24 DIAGNOSIS — I10 Essential (primary) hypertension: Secondary | ICD-10-CM | POA: Diagnosis not present

## 2024-04-24 DIAGNOSIS — E039 Hypothyroidism, unspecified: Secondary | ICD-10-CM | POA: Diagnosis not present

## 2024-04-24 DIAGNOSIS — G5621 Lesion of ulnar nerve, right upper limb: Secondary | ICD-10-CM

## 2024-04-24 DIAGNOSIS — K219 Gastro-esophageal reflux disease without esophagitis: Secondary | ICD-10-CM | POA: Insufficient documentation

## 2024-04-24 DIAGNOSIS — G4733 Obstructive sleep apnea (adult) (pediatric): Secondary | ICD-10-CM | POA: Insufficient documentation

## 2024-04-24 DIAGNOSIS — M1990 Unspecified osteoarthritis, unspecified site: Secondary | ICD-10-CM | POA: Diagnosis not present

## 2024-04-24 HISTORY — PX: TRIGGER FINGER RELEASE: SHX641

## 2024-04-24 SURGERY — DECOMPRESSION, GUYON'S CANAL
Anesthesia: Monitor Anesthesia Care | Site: Hand | Laterality: Right

## 2024-04-24 MED ORDER — ACETAMINOPHEN 500 MG PO TABS
1000.0000 mg | ORAL_TABLET | Freq: Once | ORAL | Status: AC
  Administered 2024-04-24: 1000 mg via ORAL

## 2024-04-24 MED ORDER — LACTATED RINGERS IV SOLN: INTRAVENOUS | Status: DC | PRN

## 2024-04-24 MED ORDER — CEFAZOLIN SODIUM-DEXTROSE 3-4 GM/150ML-% IV SOLN
INTRAVENOUS | Status: AC
  Filled 2024-04-24: qty 150

## 2024-04-24 MED ORDER — LIDOCAINE 2% (20 MG/ML) 5 ML SYRINGE
INTRAMUSCULAR | Status: DC | PRN
  Administered 2024-04-24: 60 mg via INTRAVENOUS

## 2024-04-24 MED ORDER — PROPOFOL 10 MG/ML IV BOLUS
INTRAVENOUS | Status: DC | PRN
Start: 2024-04-24 — End: 2024-04-24
  Administered 2024-04-24: 140 mg via INTRAVENOUS
  Administered 2024-04-24: 20 mg via INTRAVENOUS

## 2024-04-24 MED ORDER — BUPIVACAINE HCL (PF) 0.25 % IJ SOLN
INTRAMUSCULAR | Status: AC
  Filled 2024-04-24: qty 30

## 2024-04-24 MED ORDER — OXYCODONE HCL 5 MG PO TABS: 5.0000 mg | ORAL_TABLET | Freq: Once | ORAL | Status: DC | PRN

## 2024-04-24 MED ORDER — FENTANYL CITRATE (PF) 100 MCG/2ML IJ SOLN
INTRAMUSCULAR | Status: DC | PRN
  Administered 2024-04-24 (×2): 50 ug via INTRAVENOUS

## 2024-04-24 MED ORDER — ONDANSETRON HCL 4 MG/2ML IJ SOLN
INTRAMUSCULAR | Status: DC | PRN
  Administered 2024-04-24: 4 mg via INTRAVENOUS

## 2024-04-24 MED ORDER — FENTANYL CITRATE (PF) 100 MCG/2ML IJ SOLN
25.0000 ug | INTRAMUSCULAR | Status: DC | PRN
  Administered 2024-04-24: 50 ug via INTRAVENOUS

## 2024-04-24 MED ORDER — LACTATED RINGERS IV SOLN: INTRAVENOUS | Status: DC

## 2024-04-24 MED ORDER — PHENYLEPHRINE 80 MCG/ML (10ML) SYRINGE FOR IV PUSH (FOR BLOOD PRESSURE SUPPORT)
PREFILLED_SYRINGE | INTRAVENOUS | Status: AC
  Filled 2024-04-24: qty 10

## 2024-04-24 MED ORDER — BUPIVACAINE HCL (PF) 0.25 % IJ SOLN
INTRAMUSCULAR | Status: DC | PRN
  Administered 2024-04-24: 10 mL

## 2024-04-24 MED ORDER — ONDANSETRON HCL 4 MG/2ML IJ SOLN: 4.0000 mg | Freq: Once | INTRAMUSCULAR | Status: DC | PRN

## 2024-04-24 MED ORDER — EPHEDRINE 5 MG/ML INJ
INTRAVENOUS | Status: AC
  Filled 2024-04-24: qty 5

## 2024-04-24 MED ORDER — PHENYLEPHRINE 80 MCG/ML (10ML) SYRINGE FOR IV PUSH (FOR BLOOD PRESSURE SUPPORT)
PREFILLED_SYRINGE | INTRAVENOUS | Status: DC | PRN
  Administered 2024-04-24 (×2): 160 ug via INTRAVENOUS

## 2024-04-24 MED ORDER — LIDOCAINE HCL (PF) 1 % IJ SOLN
INTRAMUSCULAR | Status: AC
  Filled 2024-04-24: qty 30

## 2024-04-24 MED ORDER — OXYCODONE HCL 5 MG PO TABS: 5.0000 mg | ORAL_TABLET | Freq: Four times a day (QID) | ORAL | 0 refills | Status: DC | PRN

## 2024-04-24 MED ORDER — DEXMEDETOMIDINE HCL IN NACL 80 MCG/20ML IV SOLN
INTRAVENOUS | Status: DC | PRN
  Administered 2024-04-24 (×2): 4 ug via INTRAVENOUS

## 2024-04-24 MED ORDER — EPHEDRINE SULFATE (PRESSORS) 50 MG/ML IJ SOLN
INTRAMUSCULAR | Status: DC | PRN
  Administered 2024-04-24: 10 mg via INTRAVENOUS

## 2024-04-24 MED ORDER — CEFAZOLIN SODIUM-DEXTROSE 3-4 GM/150ML-% IV SOLN
3.0000 g | INTRAVENOUS | Status: AC
  Administered 2024-04-24: 3 g via INTRAVENOUS

## 2024-04-24 MED ORDER — FENTANYL CITRATE (PF) 100 MCG/2ML IJ SOLN
INTRAMUSCULAR | Status: AC
  Filled 2024-04-24: qty 2

## 2024-04-24 MED ORDER — ACETAMINOPHEN 500 MG PO TABS
ORAL_TABLET | ORAL | Status: AC
  Filled 2024-04-24: qty 2

## 2024-04-24 MED ORDER — OXYCODONE HCL 5 MG/5ML PO SOLN: 5.0000 mg | Freq: Once | ORAL | Status: DC | PRN

## 2024-04-24 MED ORDER — CEFAZOLIN SODIUM-DEXTROSE 2-4 GM/100ML-% IV SOLN
INTRAVENOUS | Status: AC
  Filled 2024-04-24: qty 100

## 2024-04-24 MED ORDER — LIDOCAINE 2% (20 MG/ML) 5 ML SYRINGE
INTRAMUSCULAR | Status: AC
  Filled 2024-04-24: qty 5

## 2024-04-24 MED ORDER — ONDANSETRON HCL 4 MG/2ML IJ SOLN
INTRAMUSCULAR | Status: AC
  Filled 2024-04-24: qty 2

## 2024-04-24 SURGICAL SUPPLY — 32 items
BLADE MINI RND TIP GREEN BEAV (BLADE) ×2 IMPLANT
BLADE SURG 15 STRL LF DISP TIS (BLADE) ×4 IMPLANT
BNDG COHESIVE 4X5 TAN STRL LF (GAUZE/BANDAGES/DRESSINGS) ×2 IMPLANT
BNDG ELASTIC 3INX 5YD STR LF (GAUZE/BANDAGES/DRESSINGS) ×2 IMPLANT
BNDG ELASTIC 4INX 5YD STR LF (GAUZE/BANDAGES/DRESSINGS) ×2 IMPLANT
BNDG ESMARK 4X9 LF (GAUZE/BANDAGES/DRESSINGS) ×2 IMPLANT
CHLORAPREP W/TINT 26 (MISCELLANEOUS) ×2 IMPLANT
CORD BIPOLAR FORCEPS 12FT (ELECTRODE) ×2 IMPLANT
COVER BACK TABLE 60X90IN (DRAPES) ×2 IMPLANT
CUFF TOURN SGL QUICK 18X4 (TOURNIQUET CUFF) ×2 IMPLANT
DRAPE HAND 77X146 (DRAPES) ×2 IMPLANT
GAUZE SPONGE 4X4 12PLY STRL (GAUZE/BANDAGES/DRESSINGS) ×2 IMPLANT
GAUZE STRETCH 2X75IN STRL (MISCELLANEOUS) ×2 IMPLANT
GAUZE XEROFORM 1X8 LF (GAUZE/BANDAGES/DRESSINGS) ×2 IMPLANT
GLOVE BIO SURGEON STRL SZ7.5 (GLOVE) ×4 IMPLANT
GLOVE BIOGEL PI IND STRL 7.5 (GLOVE) ×2 IMPLANT
GOWN STRL REUS W/ TWL LRG LVL3 (GOWN DISPOSABLE) ×4 IMPLANT
GOWN STRL REUS W/TWL XL LVL3 (GOWN DISPOSABLE) IMPLANT
GOWN STRL SURGICAL XL XLNG (GOWN DISPOSABLE) ×2 IMPLANT
NDL HYPO 25X5/8 SAFETYGLIDE (NEEDLE) IMPLANT
NEEDLE HYPO 25X5/8 SAFETYGLIDE (NEEDLE) IMPLANT
NS IRRIG 1000ML POUR BTL (IV SOLUTION) IMPLANT
PACK BASIN DAY SURGERY FS (CUSTOM PROCEDURE TRAY) ×2 IMPLANT
SHEET MEDIUM DRAPE 40X70 STRL (DRAPES) ×2 IMPLANT
SLING ARM FOAM STRAP LRG (SOFTGOODS) IMPLANT
SPIKE FLUID TRANSFER (MISCELLANEOUS) IMPLANT
STOCKINETTE IMPERVIOUS 9X36 MD (GAUZE/BANDAGES/DRESSINGS) ×2 IMPLANT
SUT ETHILON 4 0 PS 2 18 (SUTURE) ×2 IMPLANT
SUT PDS AB 4-0 RB1 27 (SUTURE) IMPLANT
SYR BULB EAR ULCER 3OZ GRN STR (SYRINGE) ×4 IMPLANT
SYR CONTROL 10ML LL (SYRINGE) ×2 IMPLANT
TOWEL GREEN STERILE FF (TOWEL DISPOSABLE) ×4 IMPLANT

## 2024-04-24 NOTE — Anesthesia Postprocedure Evaluation (Signed)
 Anesthesia Post Note  Patient: Brad Woods surgeon) Performed: RIGHT GUYON'S CANAL RELEASE / ULNAR TUNNEL RELEASE AND ULNAR ARTERY REPAIR (Right: Hand) RIGHT SMALL FINGER RELEASE TRIGGER FINGER/A-1 PULLEY (Right: Finger)     Patient location during evaluation: PACU Anesthesia Type: General Level of consciousness: awake and alert Pain management: pain level controlled Vital Signs Assessment: post-procedure vital signs reviewed and stable Respiratory status: spontaneous breathing, nonlabored ventilation and respiratory function stable Cardiovascular status: stable and blood pressure returned to baseline Anesthetic complications: no  No notable events documented.  Last Vitals:  Vitals:   04/24/24 0930 04/24/24 1002  BP: 133/64 134/60  Pulse: (!) 59 67  Resp: 11 16  Temp:  (!) 36.2 C  SpO2: 98% 93%    Last Pain:  Vitals:   04/24/24 1002  TempSrc: Temporal  PainSc: 0-No pain                 Juventino Oppenheim

## 2024-04-24 NOTE — Telephone Encounter (Signed)
 Pharmacy called and said they have new regulations on Narcotics. They have to have the diagnosis code and they don't have it on the recent prescription. CB#[phone]

## 2024-04-24 NOTE — Anesthesia Procedure Notes (Signed)
 Procedure Name: LMA Insertion Date/Time: 04/24/2024 7:45 AM  Performed by: Darcel Early, CRNAPre-anesthesia Checklist: Patient identified, Emergency Drugs available, Suction available, Patient being monitored and Timeout performed Patient Re-evaluated:Patient Re-evaluated prior to induction Oxygen Delivery Method: Circle system utilized Preoxygenation: Pre-oxygenation with 100% oxygen Induction Type: IV induction Ventilation: Mask ventilation without difficulty LMA: LMA with gastric port inserted LMA Size: 5.0 Number of attempts: 2 Placement Confirmation: positive ETCO2 Tube secured with: Tape Dental Injury: Teeth and Oropharynx as per pre-operative assessment

## 2024-04-24 NOTE — Telephone Encounter (Signed)
 Spoke with pharmacy. New rx called in.

## 2024-04-24 NOTE — Discharge Instructions (Addendum)
    Hand Surgery Postop Instructions   Dressings: Maintain postoperative dressing until orthopedic follow-up.  Keep operative site clean and dry until orthopedic follow-up.  Wound Care: Keep your hand elevated above the level of your heart.  Do not allow it to dangle by your side. Moving your fingers is advised to stimulate circulation but will depend on the site of your surgery.  If you have a splint applied, your doctor will advise you regarding movement.  Activity: Do not drive or operate machinery until clearance given from physician. No heavy lifting with operative extremity.  Diet:  Drink liquids today or eat a light diet.  You may resume a regular diet tomorrow.    General expectations: Take prescribed medication if given, transition to over-the-counter medication as quickly as possible. Fingers may become slightly swollen.  Call your doctor if any of the following occur: Severe pain not relieved by pain medication. Elevated temperature. Dressing soaked with blood. Inability to move fingers. White or bluish color to fingers.   Per [facility] clinic policy, our goal is ensure optimal postoperative pain control with a multimodal pain management strategy. For all [facility] patients, our goal is to wean post-operative narcotic medications by 6 weeks post-operatively. If this is not possible due to utilization of pain medication prior to surgery, your [facility] doctor will support your acute post-operative pain control for the first 6 weeks postoperatively, with a plan to transition you back to your primary pain team following that. Max Spain will work to ensure a Therapist, occupational.  Anshul Alvia Jointer, M.D. Hand Surgery [facility]    Post Anesthesia Home Care Instructions  Activity: Get plenty of rest for the remainder of the day. A responsible individual must stay with you for 24 hours following the procedure.  For the next 24 hours, DO NOT: -Drive a  car -Advertising copywriter -Drink alcoholic beverages -Take any medication unless instructed by your physician -Make any legal decisions or sign important papers.  Meals: Start with liquid foods such as gelatin or soup. Progress to regular foods as tolerated. Avoid greasy, spicy, heavy foods. If nausea and/or vomiting occur, drink only clear liquids until the nausea and/or vomiting subsides. Call your physician if vomiting continues.  Special Instructions/Symptoms: Your throat may feel dry or sore from the anesthesia or the breathing tube placed in your throat during surgery. If this causes discomfort, gargle with warm salt water . The discomfort should disappear within 24 hours.  If you had a scopolamine patch placed behind your ear for the management of post- operative nausea and/or vomiting:  1. The medication in the patch is effective for 72 hours, after which it should be removed.  Wrap patch in a tissue and discard in the trash. Wash hands thoroughly with soap and water . 2. You may remove the patch earlier than 72 hours if you experience unpleasant side effects which may include dry mouth, dizziness or visual disturbances. 3. Avoid touching the patch. Wash your hands with soap and water  after contact with the patch.  No tylenol  until after 1:10pm today

## 2024-04-24 NOTE — Op Note (Signed)
 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   ASSISTANT: Britta Candy Hooks, OPA   ANESTHESIA:  General   INTRAVENOUS FLUIDS:  Per anesthesia flow sheet.   ESTIMATED BLOOD LOSS:  Minimal.   COMPLICATIONS:  None.   SPECIMENS:  none   TOURNIQUET TIME:    Total Tourniquet Time Documented: Upper Arm (Right) - 24 minutes Total: Upper Arm (Right) - 24 minutes    DISPOSITION:  Stable to PACU.   INDICATIONS: This is a 78 year old male who was seen in the outpatient setting and found to have clinical and electrodiagnostic evidence of right wrist ulnar tunnel syndrome with associated small finger trigger digit.  Symptoms were refractory to conservative care and patient was indicated for right wrist ulnar nerve decompression at Guyon's canal with associated small finger trigger digit release.  Risks and benefits of surgery were discussed including the risks of infection, bleeding, scarring, stiffness, nerve injury, vascular injury, tendon injury, need for subsequent operation, , recurrence.  He voiced understanding of these risks and elected to proceed.  OPERATIVE COURSE: Patient was seen and identified in the preoperative area and marked appropriately.  Surgical consent had been signed. Preoperative IV antibiotic prophylaxis was given. He was transferred to the operating room and placed in supine position with the  Right Right upper extremity on an arm board.  General anesthesia was induced by the anesthesiologist.  Right upper extremity was prepped and draped in normal sterile orthopedic fashion.  A surgical pause was performed between the surgeons, anesthesia, and operating room staff and all were in agreement as to the patient, procedure, and site of procedure.  Tourniquet was placed and padded appropriately to the right upper arm.  Patient had undergone a prior open extended carpal tunnel release with well-healed scar.  We able to perform a longitudinal incision along the ulnar aspect of the forearm, just radial to the pisiform, there was a 3 cm skin bridge at the forearm without undue tension.  Incision was extended in zigzag [place] style fashion across the wrist crease and incorporated the previous carpal tunnel incision.  Skin flaps were elevated and careful dissection was performed down to the ulnar tunnel at the level of the wrist.  Incision was approximately 5 cm centered over the Guyon's canal region.  Palmaris brevis muscle was encountered and carefully dissected.  Pisiform and hook of the hamate were palpated and used as anatomical landmarks.  We identified the ulnar artery and the ulnar nerve, artery was lying just radial and superficial to the nerve.  There was a notable aneurysm in the ulnar artery which was creating compression on the ulnar nerve at the level of the wrist.  Vessel loops were used for proximal and distal control, aneurysm was resected, approximately 0.5 cm of length sharply, ends were freshened appropriately and a an ulnar artery repair was performed utilizing 6-0 nylon.  Circumferential repair was performed of the ulnar artery without undue tension.  Preoperatively, patient was noted to have appropriate  flow throughout the hand indicating dominant supply from the radial artery.  We then completed release of the Guyon's canal and deep motor branch of ulnar nerve.  Volar carpal ligament was  incised longitudinally utilizing a Beaver blade to release pressure on the nerve.  Ulnar nerve was then explored proximally and distally to ensure decompression.  Small fascial bands within the canal compressing the nerve were also released.  We then decompressed the ulnar nerve distally into the hand, deep motor branch was released by dividing the tendinous arch of the hypothenar muscles.    We then turned our attention to the right small finger.  A 2 cm oblique incision was designed at the base of the small finger.  This incision was carried down to the subcutaneous tissues.  The A1 pulley was identified and sharply divided utilizing a Beaver blade.  Tenotomy scissors were then used to confirm complete release of the A1 pulley.  The proximal portion of the A2 pulley was also released, not exceeding 25% of its substance.  Following tendon sheath incision, traction tenolysis of both FDP and FDS tendons was performed utilizing Ragnell retractors.  Smooth gliding to the tendon surface was noted.  The adherent flexor tenosynovial tissues were identified, sharply divided and sharply excised.    The tourniquet was deflated at 24 minutes.  Fingertips were pink with brisk capillary refill after deflation of tourniquet.  There remained notable dominant flow from the radial aspect of the wrist throughout the hand, all digits with appropriate color and capillary refill.  No pulsatile bleeding was appreciated from the ulnar artery repair site.  Hemostasis was achieved with bipolar electrocautery.  Copious irrigation was performed followed by standard closure utilizing 4-0 nylon in horizontal mattress fashion for both incisions.  Sterile dressings were applied utilizing Xeroform, 4 x 4's, Kerlix, plaster splint for the wrist and Ace wrap.  Sling was applied.  The operative drapes were broken down.  The patient was awoken from anesthesia safely and taken to PACU in stable condition.   Post-operative plan: The patient  will recover in the post-anesthesia care unit and then be discharged home.  The patient will be non weight bearing on the right upper extremity in a short arm volar slab wrist splint.   I will see the patient back in the office in 2 weeks for postoperative followup.    Evalynn Hankins, MD Electronically signed, 04/24/24

## 2024-04-24 NOTE — Transfer of Care (Signed)
 Immediate Anesthesia Transfer of Care Note  Patient: Isaiyah A Dismore  Procedure(s) Performed: RIGHT GUYON'S CANAL RELEASE / ULNAR TUNNEL RELEASE AND ULNAR ARTERY REPAIR (Right: Hand) RIGHT SMALL FINGER RELEASE TRIGGER FINGER/A-1 PULLEY (Right: Finger)  Patient Location: PACU  Anesthesia Type:General  Level of Consciousness: awake and patient cooperative  Airway & Oxygen Therapy: Patient Spontanous Breathing and Patient connected to face mask oxygen  Post-op Assessment: Report given to RN and Post -op Vital signs reviewed and stable  Post vital signs: Reviewed and stable  Last Vitals:  Vitals Value Taken Time  BP 142/70 04/24/24 0900  Temp    Pulse 72 04/24/24 0901  Resp 14 04/24/24 0901  SpO2 97 % 04/24/24 0901  Vitals shown include unfiled device data.  Last Pain:  Vitals:   04/24/24 0703  TempSrc: Tympanic  PainSc: 0-No pain      Patients Stated Pain Goal: 8 (04/24/24 0703)  Complications: No notable events documented.

## 2024-04-24 NOTE — H&P (Signed)
 Brad Woods - 78 y.o. male MRN [id number]  Date of birth: July 04, 1946   HAND SURGERY H&P UPDATE   HPI: Patient is a 78 y.o. male who presents today for right hand Guyon's canal release and small finger trigger digit release.  He has clinical and electrodiagnostic evidence to confirm diagnosis. Patient denies any changes to their medical history or new systemic symptoms today.    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   Past Surgical History:  Procedure Laterality Date   BICEPT TENODESIS Right 10/22/2023   Procedure: BICEPS TENODESIS;  Surgeon: Jasmine Mesi, MD;  Location: [facility] OR;  Service: Orthopedics;  Laterality: Right;   CARDIAC CATHETERIZATION N/A 05/20/2016   Procedure: Left Heart Cath and Coronary Angiography;  Surgeon: Avanell Leigh, MD;  Location: [facility] INVASIVE CV LAB;  Service: Cardiovascular;  Laterality: N/A;   CARDIAC CATHETERIZATION N/A 11/22/2016   Procedure: Left Heart Cath and Coronary Angiography;  Surgeon: Sammy Crisp, MD;  Location: [facility] INVASIVE CV LAB;  Service: Cardiovascular;  Laterality: N/A;   CARPAL TUNNEL RELEASE Right    CHOLECYSTECTOMY N/A 03/10/2024   Procedure: LAPAROSCOPIC CHOLECYSTECTOMY;  Surgeon: Kinsinger, Alphonso Aschoff, MD;  Location: [facility] OR;  Service: General;  Laterality: N/A;   COLONOSCOPY     EYE SURGERY Left    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, 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   INGUINAL HERNIA REPAIR  1955   LEFT HEART CATH AND CORONARY ANGIOGRAPHY N/A 05/31/2020   Procedure: LEFT HEART CATH AND CORONARY  ANGIOGRAPHY;  Surgeon: Millicent Ally, MD;  Location: [facility] INVASIVE CV LAB;  Service: Cardiovascular;  Laterality: N/A;   RECTAL SURGERY     polyps and fissures removed   REPLACEMENT TOTAL KNEE Bilateral    REVERSE SHOULDER ARTHROPLASTY Right 10/22/2023   Procedure: RIGHT REVERSE SHOULDER ARTHROPLASTY;  Surgeon: Jasmine Mesi, MD;  Location[facility] OR;  Service: Orthopedics;  Laterality: Right;   TONSILLECTOMY     Social History   Socioeconomic History   Marital status: Married    Spouse name: Not on file   Number of children: 4   Years of education: Not on file   Highest education level: Not on file  Occupational History   Not on file  Tobacco Use   Smoking status: Never   Smokeless tobacco: Never  Vaping Use   Vaping status: Never Used  Substance and Sexual Activity   Alcohol use: Yes    Comment: maybe 1 beer twice a month   Drug use: No   Sexual activity: Yes    Birth control/protection: None  Other Topics Concern   Not on file  Social History Narrative   Not on file   Social Drivers of Health   Financial Resource Strain: Not on file  Food Insecurity: Not on file  Transportation Needs: Not on file  Physical Activity: Not on file  Stress: Not on file  Social Connections: Not on  file   Family History  Problem Relation Age of Onset   Colon cancer Mother 22   - negative except otherwise stated in the family history section Allergies  Allergen Reactions   Hydromorphone Hcl Other (See Comments)    "flatlined" - Patient states he was overdosed. He states he isn't allergic.   Hydromorphone     Cardiac arrest   Aleve [Naproxen Sodium] Dermatitis    States he was given aleve in the hospital and it caused welts.    Prior to Admission medications   Medication Sig Start Date End Date Taking? Authorizing Provider  albuterol  (VENTOLIN  HFA) 108 (90 Base) MCG/ACT inhaler Inhale 2 puffs into the lungs every 6 (six) hours as needed for wheezing or shortness of breath.  10/03/23  Yes Hunsucker, Archer Kobs, MD  aspirin  EC 81 MG EC tablet Take 1 tablet (81 mg total) by mouth daily. 05/22/16  Yes Pasqual Bone, MD  carvedilol  (COREG ) 12.5 MG tablet Take 1 tablet (12.5 mg total) by mouth 2 (two) times daily. 10/01/23  Yes Fountain, Madison L, NP  clopidogrel  (PLAVIX ) 75 MG tablet Take 1 tablet (75 mg total) by mouth daily. 10/01/23  Yes Fountain, Madison L, NP  Evolocumab  (REPATHA  SURECLICK) 140 MG/ML SOAJ Inject 140 mg into the skin every 14 (fourteen) days. 02/19/24  Yes Nahser, Lela Purple, MD  finasteride  (PROSCAR ) 5 MG tablet Take 5 mg by mouth daily.   Yes [provider]  fluticasone (FLONASE) 50 MCG/ACT nasal spray Place 2 sprays into the nose daily as needed for allergies.  12/07/13  Yes [provider]  Fluticasone-Umeclidin-Vilant (TRELEGY ELLIPTA ) 200-62.5-25 MCG/ACT AEPB Inhale 1 puff into the lungs daily. Patient taking differently: Inhale 1 puff into the lungs daily as needed (shortness of breath). 10/03/23  Yes Hunsucker, Archer Kobs, MD  isosorbide  mononitrate (IMDUR ) 60 MG 24 hr tablet Take 1 tablet (60 mg total) by mouth daily. 10/01/23 04/20/24 Yes Fountain, Madison L, NP  Multiple Vitamins-Minerals (CENTRUM SILVER 50+MEN PO) Take 1 tablet by mouth daily.   Yes [provider]  tamsulosin  (FLOMAX ) 0.4 MG CAPS capsule Take 0.4 mg by mouth at bedtime.  08/14/16  Yes [provider]  amoxicillin -clavulanate (AUGMENTIN ) 875-125 MG tablet Take 1 tablet by mouth every 12 (twelve) hours. 03/15/24   Curatolo, Adam, DO  docusate sodium  (COLACE) 100 MG capsule Take 1 capsule (100 mg total) by mouth 2 (two) times daily. Patient not taking: Reported on 03/03/2024 10/23/23   Magnant, Justice Olp, PA-C  gabapentin  (NEURONTIN ) 300 MG capsule Take 1 capsule (300 mg total) by mouth 3 (three) times daily. Patient not taking: Reported on 03/03/2024 12/16/23   Jasmine Mesi, MD  nitroGLYCERIN  (NITROSTAT ) 0.4 MG SL tablet Dissolve 1 tablet under  the tongue every 5 minutes as needed for chest pain. Max of 3 doses, then 911. 07/09/23   Nahser, Lela Purple, MD  ondansetron  (ZOFRAN -ODT) 4 MG disintegrating tablet Take 1 tablet (4 mg total) by mouth every 8 (eight) hours as needed for nausea or vomiting. 02/15/24   Sherel Dikes, PA-C  oxyCODONE  (OXY IR/ROXICODONE ) 5 MG immediate release tablet Take 1 tablet (5 mg total) by mouth every 6 (six) hours as needed for severe pain (pain score 7-10). 03/10/24   Kinsinger, Alphonso Aschoff, MD  oxyCODONE -acetaminophen  (PERCOCET) 5-325 MG tablet Take 1-2 tablets by mouth every 6 (six) hours as needed. Patient not taking: Reported on 03/03/2024 02/15/24   Sherel Dikes, PA-C   No results found. - Positive ROS: All other  systems have been reviewed and were otherwise negative with the exception of those mentioned in the HPI and as above.  Physical Exam: General: No acute distress, resting comfortably Cardiovascular: BUE warm and well perfused, normal rate Respiratory: Normal WOB on RA Skin: Warm and dry Neurologic: Sensation intact distally Psychiatric: Patient is at baseline mood and affect  Right Upper Extremity  Skin and Muscle: Prior right wrist carpal tunnel incision extends from the palmar aspect into the forearm, well-healed.  Muscle bulk and contour: there is evidence of mild FDI atrophy of the right hand.   Range of Motion and Palpation Tests: Mobility is full about the elbows with flexion and extension.  Forearm supination and pronation are 85/85 bilaterally.  Wrist flexion/extension is 75/65 bilaterally.  Digital flexion and extension are full.  Thumb opposition is full to the base of the small fingers bilaterally.     Palpable nodule at the right small finger A1 pulley with associated tenderness.  Notable clicking of the small finger with active flexion on examination today.     Neurologic, Vascular, Motor: Sensation is diminished to light touch in the volar ulnar distribution of the hand  right side.   Tinel's testing positive at the right Guyon's canal Phalen's negative bilaterally, Derkan's compression negative bilaterally Mild FDI wasting, positive Froment's sign   Fingers pink and well perfused.  Capillary refill is brisk.   Assessment/Plan: OR today for right wrist ulnar tunnel release, Guyon's canal and small finger trigger digit release. We again reviewed the risks of surgery which include bleeding, infection, damage to neurovascular structures, persistent symptoms, need for additional surgery.  Informed consent was signed.  All questions were answered.   [facility], Hand Surgery

## 2024-04-25 ENCOUNTER — Encounter (HOSPITAL_BASED_OUTPATIENT_CLINIC_OR_DEPARTMENT_OTHER): Payer: Self-pay | Admitting: Orthopedic Surgery

## 2024-04-28 ENCOUNTER — Other Ambulatory Visit: Payer: Self-pay | Admitting: Orthopedic Surgery

## 2024-05-05 NOTE — Therapy (Signed)
 OUTPATIENT OCCUPATIONAL THERAPY ORTHO EVALUATION  Patient Name: Brad Woods MRN: [id number] DOB:March 11, 1946, 78 y.o., male Today's Date: 05/06/2024  PCP: Sally Crazier MD REFERRING PROVIDER:   Merrill Abide, MD    END OF SESSION:  OT End of Session - 05/06/24 1347     Visit Number 1    Number of Visits 10    Date for OT Re-Evaluation 06/19/24    Authorization Type [employer]    OT Start Time 1348    OT Stop Time 1429    OT Time Calculation (min) 41 min    Activity Tolerance Patient tolerated treatment well;No increased pain;Patient limited by pain;Patient limited by fatigue    Behavior During Therapy [facility] for tasks assessed/performed             Past Medical History:  Diagnosis Date   Allergy    Arthritis    Asthma    when younger   COPD (chronic obstructive pulmonary disease) (HCC)    Coronary artery disease    s/p stenting 10/2016   Dyspnea    History of kidney stones    15x   Hyperlipidemia    Hypertension    Myocardial infarction [facility])    Neuromuscular disorder (HCC)    neuropathy right hand   Pneumonia    As a child   Sleep apnea    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   Past Surgical History:  Procedure Laterality Date   BICEPT TENODESIS Right 10/22/2023   Procedure: BICEPS TENODESIS;  Surgeon: Jasmine Mesi, MD;  Location: [facility] OR;  Service: Orthopedics;  Laterality: Right;   CARDIAC CATHETERIZATION N/A 05/20/2016   Procedure: Left Heart Cath and Coronary Angiography;  Surgeon: Avanell Leigh, MD;  Location: [facility] INVASIVE CV LAB;  Service: Cardiovascular;  Laterality: N/A;   CARDIAC CATHETERIZATION N/A 11/22/2016   Procedure: Left Heart Cath and Coronary Angiography;  Surgeon: Sammy Crisp, MD;  Location: [facility] INVASIVE CV LAB;  Service: Cardiovascular;  Laterality: N/A;   CARPAL TUNNEL RELEASE Right    CHOLECYSTECTOMY N/A 03/10/2024   Procedure: LAPAROSCOPIC CHOLECYSTECTOMY;  Surgeon: Kinsinger, Alphonso Aschoff, MD;  Location: [facility]  OR;  Service: General;  Laterality: N/A;   COLONOSCOPY     EYE SURGERY Left    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, 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   INGUINAL HERNIA REPAIR  1955   LEFT HEART CATH AND CORONARY ANGIOGRAPHY N/A 05/31/2020   Procedure: LEFT HEART CATH AND CORONARY ANGIOGRAPHY;  Surgeon: Millicent Ally, MD;  Location: [facility] INVASIVE CV LAB;  Service: Cardiovascular;  Laterality: N/A;   RECTAL SURGERY     polyps and fissures removed   REPLACEMENT TOTAL KNEE Bilateral    REVERSE SHOULDER ARTHROPLASTY Right 10/22/2023   Procedure: RIGHT REVERSE SHOULDER ARTHROPLASTY;  Surgeon: Jasmine Mesi, MD;  Location: [facility] OR;  Service: Orthopedics;  Laterality: Right;   TONSILLECTOMY     TRIGGER FINGER RELEASE Right 04/24/2024   Procedure: RIGHT SMALL FINGER RELEASE TRIGGER FINGER/A-1 PULLEY;  Surgeon: Merrill Abide, MD;  Location: [facility];  Service: Orthopedics;  Laterality: Right;   Patient Active Problem List   Diagnosis Date Noted   Ulnar tunnel syndrome of right wrist 04/24/2024   Trigger finger, right little finger 04/24/2024   OA (osteoarthritis) of shoulder 10/22/2023   S/P reverse total shoulder arthroplasty, right 10/22/2023   Arthritis of right shoulder region 10/22/2023   Biceps tendonitis, right 10/22/2023   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   Morbid obesity (HCC) 07/27/2020  Dizziness 06/22/2020   Exertional dyspnea    Chronic pain of left knee 09/02/2018   Chronic pain of right knee 09/02/2018   History of bilateral knee replacement 09/02/2018   Allergic rhinitis 08/15/2018   Erectile dysfunction 08/15/2018   History of renal stone 08/15/2018   Migraine 08/15/2018   Osteoarthritis 08/15/2018   Hyperlipidemia 08/15/2018   PVD (peripheral vascular disease) (HCC) 08/15/2018   Left shoulder pain 07/30/2018   Chest pain 07/30/2018   Trochanteric bursitis, left hip 05/02/2017   Carpal tunnel syndrome, left upper limb 05/02/2017    Trochanteric bursitis, right hip 03/18/2017   Mixed hyperlipidemia 12/31/2016   Coronary artery disease involving native coronary artery of native heart with angina pectoris (HCC) 11/25/2016   Coronary artery disease involving native coronary artery of native heart without angina pectoris 11/20/2016   Elevated troponin    Unstable angina (HCC) 05/18/2016   Acute coronary syndrome (HCC) 05/18/2016   Benign non-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 10/12/2014   Essential hypertension 10/12/2014   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 10/12/2014   High risk medication use 10/12/2014   IFG (impaired fasting glucose) 10/12/2014   OSA (obstructive sleep apnea) 02/04/2014   Hypothyroidism 03/02/2013   GERD (gastroesophageal reflux disease) 09/04/2012    ONSET DATE: DOS 04/24/24  REFERRING DIAG: G56.21 (ICD-10-CM) - Ulnar tunnel syndrome of right wrist   THERAPY DIAG:  Pain in right hand  Stiffness of right hand, not elsewhere classified  Muscle weakness (generalized)  Localized edema  Other lack of coordination  Paresthesia of skin  Rationale for Evaluation and Treatment: Rehabilitation  SUBJECTIVE:   SUBJECTIVE STATEMENT: Now ~2 weeks s/p Rt Guyon's canal release, aneurysm repair, and small finger trigger finger release.  He states he had a shoulder surgery and afterward his hand felt numb in the ulnar nerve distribution.  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  He also had a chronic triggering small finger so he had a release of this as well.  He now describes swelling and pain and stiffness and hypersensitivity as a burning pain through his small finger and ulnar side of ring finger.  He does appear to be guarded from pain and holding his hand nervously outstretched today.   PERTINENT HISTORY: Past shoulder surgery and ulnar nerve compression as well as triggering small finger.  PRECAUTIONS: None  RED  FLAGS: None   WEIGHT BEARING RESTRICTIONS: Yes nonweightbearing in right hand and arm now  PAIN:  Are you having pain? Yes: NPRS scale: 3-4/10 at rest, up to 5-6/10 at worst at night  Pain location: Right small finger and ring finger and surgical site Pain description: Burning and nerve pain Aggravating factors: Holding hand nervously outstretched Relieving factors: Rest  FALLS: Has patient fallen in last 6 months? No  LIVING ENVIRONMENT: Lives with: lives with their family and lives with their spouse   PLOF: Independent  PATIENT GOALS: To improve pain in an, and use of right hand and arm.  NEXT MD VISIT: 05/21/2024   OBJECTIVE: (All objective assessments below are from initial evaluation on: 05/06/2024 unless otherwise specified.)   HAND DOMINANCE: Right   ADLs: Overall ADLs: States decreased ability to grab, hold household objects, pain and difficulty to open containers, perform FMS tasks (manipulate fasteners on clothing), mild to moderate bathing problems as well.    FUNCTIONAL OUTCOME MEASURES: Eval: Patient Specific Functional Scale: 1.3 (weed wacker, open a jar, use utensils)  (Higher Score  =  Better Ability for the Selected Tasks)      UPPER EXTREMITY ROM     Shoulder to Wrist AROM Right eval  Forearm supination   Forearm pronation    Wrist flexion 49  Wrist extension 44  Wrist ulnar deviation   Wrist radial deviation   Functional dart thrower's motion (F-DTM) in ulnar flexion   F-DTM in radial extension    (Blank rows = not tested)   Hand AROM Right eval  Full Fist Ability (or Gap to Distal Palmar Crease) 4.5cm gap from tip of SF to [facility]  Thumb Opposition  (Kapandji Scale)  6/10  Little MCP (0-90) 0-  44  Little PIP (0-100) (-10) -  71  Little DIP (0-70) 0-  58  (Blank rows = not tested)   UPPER EXTREMITY MMT:    Eval:  NT at eval due to recent and still healing injuries. Will be tested when appropriate.   MMT Right TBD  Shoulder flexion    Shoulder abduction   Shoulder adduction   Shoulder extension   Shoulder internal rotation   Shoulder external rotation   Middle trapezius   Lower trapezius   Elbow flexion   Elbow extension   Forearm supination   Forearm pronation   Wrist flexion   Wrist extension   Wrist ulnar deviation   Wrist radial deviation   (Blank rows = not tested)  HAND FUNCTION: Eval: Observed weakness in affected right hand.  Details will be tested when safe Grip strength Right: TBD lbs, Left: TBD lbs   COORDINATION: Eval: Observed coordination impairments with affected right hand. 9 Hole Peg Test Right: 36sec (25 sec is WFL)   SENSATION: Eval:  Light touch intact today, though diminished around sx area in an ulnar nerve distribution distally.  IF, thumb, radial RF feel 2.83 (normal), but SF and ulnar RF can only feel 3.61 (diminished light touch)   EDEMA:   Eval:  Mildly swollen in right hand and wrist today  COGNITION: Eval: Overall cognitive status: WFL for evaluation today   OBSERVATIONS:   Eval: Surgical site covered with Steri-Strip and trigger finger release well-healing.  No signs of infection etc.  Typical amount of tenderness and also has some hypersensitivity through the ulnar nerve distribution distal to the surgical site now.   TODAY'S TREATMENT:  Post-evaluation treatment:     For safety/self-care he was given education on managing his wound, doing no weightbearing or pushing pulling or grabbing or forceful activities with the right hand and arm for at least 4 to 6 weeks.  He was educated on gentle desensitization and scar mobilization to begin now.  He was educated on other self-care which is listed underneath patient instructions.  Next, he was given the following home exercise program to perform between 4 and 6 times a day very gently to keep the tissues and nerves colliding in a nonpainful way.  It was emphasized to him multiple times to do these things in a nonpainful way  to feel light tension when performing.  Each 1 was done with him and he performed back showing good understanding and no pain.  Exercises - Ulnar Nerve Flossing  - 4-6 x daily - 5-10 reps - Reach arms upward   - 4 x daily - 10 reps - Turn [employer] Facing Up & Down  - 4-6 x daily - 10-15 reps - Bend and Pull Back Wrist SLOWLY  - 4 x daily - 10-15 reps - "Windshield Wipers"   - 4  x daily - 10-15 reps - Finger Spreading  - 4-6 x daily - 10-15 reps - Tendon Glides  - 4-6 x daily - 3-5 reps - 2-3 seconds hold - Thumb Opposition  - 4-6 x daily - 10 reps  He was asked to wear his old prefabricated wrist cock up brace at night for comfort if needed, if he is not sleeping well.  No custom orthosis was indicated today as his hand is moving well and his wrist is moving well and these are not typically recommended during the day after these types of surgeries.  If he returns and has some forming contractures, or other issues that would benefit from an orthosis, 1 will be fabricated for him.  PATIENT EDUCATION: Education details: See tx section above for details  Person educated: Patient Education method: Verbal Instruction, Teach back, Handouts  Education comprehension: States and demonstrates understanding, Additional Education required    HOME EXERCISE PROGRAM: Access Code: [email or web address] URL: [email or web address] Date: 05/06/2024 Prepared by: Leartis Proud   GOALS: Goals reviewed with patient? Yes   SHORT TERM GOALS: (STG required if POC>30 days) Target Date: 05/22/2024  Pt will obtain protective, custom orthotic. Goal status: TBD/PRN  2.  Pt will demo/state understanding of initial HEP to improve pain levels and prerequisite motion. Goal status: INITIAL   LONG TERM GOALS: Target Date: 06/19/24  Pt will improve functional ability by decreased impairment per PSFS assessment from 1.3 to 5 or better, for better quality of life. Goal status: INITIAL  2.  Pt will improve grip  strength in right dominant hand hand from unsafe to test to at least 30 lbs for functional use at home and in IADLs. Goal status: INITIAL  3.  Pt will improve A/ROM in right wrist flexion/extension from 49/44 respectively to at least 60 degrees each, to have functional motion for tasks like reach and grasp.  Goal status: INITIAL  4.  Pt will improve A/ROM in right small finger total active motion from 163 degrees to at least 200 degrees, to have functional motion for tasks like reach and grasp.  Goal status: INITIAL  5.  Pt will improve strength in right wrist flexion/extension from apparent 3 -/5 MMT to at least 4+/5 MMT to have increased functional ability to carry out selfcare and higher-level homecare tasks with less difficulty. Goal status: INITIAL  5.  Pt will improve coordination skills in right dominant hand, as seen by within functional limit score on nine-hole peg testing to have increased functional ability to carry out fine motor tasks (fasteners, etc.) and more complex, coordinated IADLs (meal prep, sports, etc.).  Goal status: INITIAL  6.  Pt will decrease pain at worst from 5-6/10 to 2/10 or better to have better sleep and occupational participation in daily roles. Goal status: INITIAL    ASSESSMENT:  CLINICAL IMPRESSION: Patient is a 78y.o. male who was seen today for occupational therapy evaluation for stiffness, weakness, soreness, paresthesia, decreased ability with right dominant hand and arm due to recent trigger finger release of the small finger and Guyon's canal release with vascular repair.  He will benefit from outpatient occupational therapy to decrease symptoms and increase quality of life  PERFORMANCE DEFICITS: in functional skills including ADLs, IADLs, coordination, sensation, edema, ROM, strength, pain, fascial restrictions, flexibility, Fine motor control, body mechanics, endurance, decreased knowledge of precautions, wound, and UE functional use, cognitive  skills including problem solving and safety awareness, and psychosocial skills including coping strategies, environmental adaptation, habits, and routines and behaviors.  IMPAIRMENTS: are limiting patient from ADLs, IADLs, rest and sleep, and leisure.   COMORBIDITIES: may have co-morbidities  that affects occupational performance. Patient will benefit from skilled OT to address above impairments and improve overall function.  MODIFICATION OR ASSISTANCE TO COMPLETE EVALUATION: No modification of tasks or assist necessary to complete an evaluation.  OT OCCUPATIONAL PROFILE AND HISTORY: Detailed assessment: Review of records and additional review of physical, cognitive, psychosocial history related to current functional performance.  CLINICAL DECISION MAKING: LOW - limited treatment options, no task modification necessary  REHAB POTENTIAL: Excellent  EVALUATION COMPLEXITY: Low      PLAN:  OT FREQUENCY: 1-2x/week  OT DURATION: 6 weeks through 06/19/2024 and up to 10 total visits as needed  PLANNED INTERVENTIONS: 97168 OT Re-evaluation, 97535 self care/ADL training, 16109 therapeutic exercise, 97530 therapeutic activity, 97112 neuromuscular re-education, 97140 manual therapy, 97035 ultrasound, 97760 Orthotic Initial, 97763 Orthotic/Prosthetic subsequent, scar mobilization, compression bandaging, energy conservation, coping strategies training, and patient/family education  RECOMMENDED OTHER SERVICES: None now  CONSULTED AND AGREED WITH PLAN OF CARE: Patient  PLAN FOR NEXT SESSION:   Check wounds, check range of motion, check sensation, check initial recommendations and home exercise program and upgrade as tolerated and appropriate   Leartis Proud, OTR/L, CHT 05/06/2024, 2:55 PM

## 2024-05-06 ENCOUNTER — Ambulatory Visit: Admitting: Orthopedic Surgery

## 2024-05-06 ENCOUNTER — Encounter: Payer: Self-pay | Admitting: Rehabilitative and Restorative Service Providers"

## 2024-05-06 ENCOUNTER — Ambulatory Visit: Admitting: Rehabilitative and Restorative Service Providers"

## 2024-05-06 DIAGNOSIS — M65351 Trigger finger, right little finger: Secondary | ICD-10-CM

## 2024-05-06 DIAGNOSIS — M6281 Muscle weakness (generalized): Secondary | ICD-10-CM | POA: Diagnosis not present

## 2024-05-06 DIAGNOSIS — M25641 Stiffness of right hand, not elsewhere classified: Secondary | ICD-10-CM

## 2024-05-06 DIAGNOSIS — R278 Other lack of coordination: Secondary | ICD-10-CM | POA: Diagnosis not present

## 2024-05-06 DIAGNOSIS — R600 Localized edema: Secondary | ICD-10-CM | POA: Diagnosis not present

## 2024-05-06 DIAGNOSIS — M79641 Pain in right hand: Secondary | ICD-10-CM

## 2024-05-06 DIAGNOSIS — R202 Paresthesia of skin: Secondary | ICD-10-CM

## 2024-05-06 DIAGNOSIS — G5621 Lesion of ulnar nerve, right upper limb: Secondary | ICD-10-CM

## 2024-05-06 NOTE — Patient Instructions (Signed)
  Nate's Trigger Finger Release Recommendations   Do not to soak your wound for at least 1 week, or until it looks well healed. Wash hand or shower quickly, then dab dry gently. You can put a Vaseline or plain lotion on your closed wound to help the scar heal smoothly. (Don't leave it looking dry and crusty.)   Avoid any strong gripping, push, pull, weight bearing or repetitive motion for the next month.  (If it hurts very badly- don't do it!) After a month, you can progressively return to all normal, light activities. Sports and heavy weightlifting should be withheld for about 3 months.   Start touching/rubbing your scar and gently trying to shift the top layer of your skin. It's ok to do this gently over top of steri-strips. Do not remove steri-strips, but wait for them to fall off naturally. Rub, touch gently for 2-3 minutes, 3-4 x day.      If you have any tingling or itching/burning around your scar, just lightly rub/brush it with a cloth for 2-3 mins to calm your nerves. (vibration can help, too!)  Begin with gentle motion exercises about 4 times a day, non-painfully, feeling light to medium tension.  (See exercise sheet)  Wait 3-4 weeks after surgery to use "squeeze balls" or "hand grippers"  Wait 7-8 weeks after surgery to start strengthening in your arm in a gym setting   In general, keep your hand moving, and don't let it heal stiffly.  If you're sore- slow down!   If you're stiff- speed up!

## 2024-05-06 NOTE — Progress Notes (Addendum)
   Brad Woods - 78 y.o. male MRN [id number]  Date of birth: 01-04-1946  Office Visit Note: Visit Date: 05/06/2024 PCP: Wyn Heater, MD Referred by: Wyn Heater, MD  Subjective:  HPI: Brad Woods is a 78 y.o. male who presents today for follow up 2 weeks status post right wrist guyon's canal release/ ulnar tunnel release and ulnar artery repair for aneurysm.  Pertinent ROS were reviewed with the patient and found to be negative unless otherwise specified above in HPI.   Assessment & Plan: Visit Diagnoses:  1. Ulnar tunnel syndrome of right wrist   2. Trigger little finger of right hand     Plan: He is doing well overall.  Does have some ongoing pain in the incisional site region with ongoing numbness in the small finger.  I did explain that given the significance of the nerve compression seen in the region secondary to the ulnar artery aneurysm, there was an element of neuropraxia that may have resulted postoperatively.  In addition, we did operate on the small finger trigger digit which also may have an element of neuropraxia on the digital nerves.  Wound is well-healing, sutures removed today.  He will be seen by occupational therapy for fabrication of an orthosis for the wrist and begin range of motion exercises.  I will plan on seeing him back in approximate 4 weeks to track his progress.  Follow-up: No follow-ups on file.   Meds & Orders: No orders of the defined types were placed in this encounter.  No orders of the defined types were placed in this encounter.    Procedures: No procedures performed       Objective:   Vital Signs: There were no vitals taken for this visit.  Ortho Exam Right wrist with well-healing incisional site at the wrist level and at the base of the small finger, sutures removed today, no erythema or drainage, skin edges remain well-approximated  Able to perform range of motion of the small finger without significant restriction, no  evidence of residual clicking or locking  Ongoing numbness persistent in the small finger both radially and ulnarly to light touch  Hand remains warm and well-perfused, fully palpable pulse at the radial artery at the wrist level, ulnar artery at the wrist level minimally palpable.  Allen's testing demonstrates appropriate refill with radial flow.   Imaging: No results found.   Josanna Hefel Alvia Jointer, M.D. [facility], Hand Surgery

## 2024-05-12 NOTE — Therapy (Signed)
 OUTPATIENT OCCUPATIONAL THERAPY TREATMENT NOTE  Patient Name: Brad Woods MRN: [id number] DOB:Jul 25, 1946, 78 y.o., male Today's Date: 05/13/2024  PCP: Sally Crazier MD REFERRING PROVIDER:   Merrill Abide, MD    END OF SESSION:  OT End of Session - 05/13/24 1151     Visit Number 2    Number of Visits 10    Date for OT Re-Evaluation 06/19/24    Authorization Type [employer]    OT Start Time 1151    OT Stop Time 1222    OT Time Calculation (min) 31 min    Equipment Utilized During Treatment compressive gauze and silicone scar pads    Activity Tolerance Patient tolerated treatment well;No increased pain;Patient limited by fatigue;Patient limited by pain    Behavior During Therapy [facility] for tasks assessed/performed              Past Medical History:  Diagnosis Date   Allergy    Arthritis    Asthma    when younger   COPD (chronic obstructive pulmonary disease) (HCC)    Coronary artery disease    s/p stenting 10/2016   Dyspnea    History of kidney stones    15x   Hyperlipidemia    Hypertension    Myocardial infarction [facility])    Neuromuscular disorder (HCC)    neuropathy right hand   Pneumonia    As a child   Sleep apnea    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   Past Surgical History:  Procedure Laterality Date   BICEPT TENODESIS Right 10/22/2023   Procedure: BICEPS TENODESIS;  Surgeon: Jasmine Mesi, MD;  Location: [facility] OR;  Service: Orthopedics;  Laterality: Right;   CARDIAC CATHETERIZATION N/A 05/20/2016   Procedure: Left Heart Cath and Coronary Angiography;  Surgeon: Avanell Leigh, MD;  Location: [facility] INVASIVE CV LAB;  Service: Cardiovascular;  Laterality: N/A;   CARDIAC CATHETERIZATION N/A 11/22/2016   Procedure: Left Heart Cath and Coronary Angiography;  Surgeon: Sammy Crisp, MD;  Location: [facility] INVASIVE CV LAB;  Service: Cardiovascular;  Laterality: N/A;   CARPAL TUNNEL RELEASE Right    CHOLECYSTECTOMY N/A 03/10/2024   Procedure:  LAPAROSCOPIC CHOLECYSTECTOMY;  Surgeon: Kinsinger, Alphonso Aschoff, MD;  Location: [facility] OR;  Service: General;  Laterality: N/A;   COLONOSCOPY     EYE SURGERY Left    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, 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   INGUINAL HERNIA REPAIR  1955   LEFT HEART CATH AND CORONARY ANGIOGRAPHY N/A 05/31/2020   Procedure: LEFT HEART CATH AND CORONARY ANGIOGRAPHY;  Surgeon: Millicent Ally, MD;  Location: [facility] INVASIVE CV LAB;  Service: Cardiovascular;  Laterality: N/A;   RECTAL SURGERY     polyps and fissures removed   REPLACEMENT TOTAL KNEE Bilateral    REVERSE SHOULDER ARTHROPLASTY Right 10/22/2023   Procedure: RIGHT REVERSE SHOULDER ARTHROPLASTY;  Surgeon: Jasmine Mesi, MD;  Location: [facility] OR;  Service: Orthopedics;  Laterality: Right;   TONSILLECTOMY     TRIGGER FINGER RELEASE Right 04/24/2024   Procedure: RIGHT SMALL FINGER RELEASE TRIGGER FINGER/A-1 PULLEY;  Surgeon: Merrill Abide, MD;  Location: [facility];  Service: Orthopedics;  Laterality: Right;   Patient Active Problem List   Diagnosis Date Noted   Ulnar tunnel syndrome of right wrist 04/24/2024   Trigger finger, right little finger 04/24/2024   OA (osteoarthritis) of shoulder 10/22/2023   S/P reverse total shoulder arthroplasty, right 10/22/2023   Arthritis of right shoulder region 10/22/2023   Biceps  tendonitis, right 10/22/2023   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   Morbid obesity (HCC) 07/27/2020   Dizziness 06/22/2020   Exertional dyspnea    Chronic pain of left knee 09/02/2018   Chronic pain of right knee 09/02/2018   History of bilateral knee replacement 09/02/2018   Allergic rhinitis 08/15/2018   Erectile dysfunction 08/15/2018   History of renal stone 08/15/2018   Migraine 08/15/2018   Osteoarthritis 08/15/2018   Hyperlipidemia 08/15/2018   PVD (peripheral vascular disease) (HCC) 08/15/2018   Left shoulder pain 07/30/2018   Chest pain 07/30/2018   Trochanteric bursitis, left  hip 05/02/2017   Carpal tunnel syndrome, left upper limb 05/02/2017   Trochanteric bursitis, right hip 03/18/2017   Mixed hyperlipidemia 12/31/2016   Coronary artery disease involving native coronary artery of native heart with angina pectoris (HCC) 11/25/2016   Coronary artery disease involving native coronary artery of native heart without angina pectoris 11/20/2016   Elevated troponin    Unstable angina (HCC) 05/18/2016   Acute coronary syndrome (HCC) 05/18/2016   Benign non-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 10/12/2014   Essential hypertension 10/12/2014   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 10/12/2014   High risk medication use 10/12/2014   IFG (impaired fasting glucose) 10/12/2014   OSA (obstructive sleep apnea) 02/04/2014   Hypothyroidism 03/02/2013   GERD (gastroesophageal reflux disease) 09/04/2012    ONSET DATE: DOS 04/24/24  REFERRING DIAG: G56.21 (ICD-10-CM) - Ulnar tunnel syndrome of right wrist   THERAPY DIAG:  Pain in right hand  Stiffness of right hand, not elsewhere classified  Muscle weakness (generalized)  Localized edema  Other lack of coordination  Paresthesia of skin  Rationale for Evaluation and Treatment: Rehabilitation  PERTINENT HISTORY: Past shoulder surgery and ulnar nerve compression as well as triggering small finger. He states he had a shoulder surgery and afterward his hand felt numb in the ulnar nerve distribution.  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  He also had a chronic triggering small finger so he had a release of this as well.  He now describes swelling and pain and stiffness and hypersensitivity as a burning pain through his small finger and ulnar side of ring finger.  He does appear to be guarded from pain and holding his hand nervously outstretched today.   PRECAUTIONS: None  RED FLAGS: None   WEIGHT BEARING RESTRICTIONS: Yes nonweightbearing in right  hand and arm now   SUBJECTIVE:   SUBJECTIVE STATEMENT: Now ~3 weeks s/p Rt Guyon's canal release, aneurysm repair, and small finger trigger finger release.  He states    PAIN:  Are you having pain? Yes: NPRS scale:   3-4/10 at rest, up to 5-6/10 at worst at night  Pain location: Right small finger and ring finger and surgical site Pain description: Burning and nerve pain Aggravating factors: Holding hand nervously outstretched Relieving factors: Rest  PATIENT GOALS: To improve pain in an, and use of right hand and arm.  NEXT MD VISIT: 05/21/2024   OBJECTIVE: (All objective assessments below are from initial evaluation on: 05/06/2024 unless otherwise specified.)   HAND DOMINANCE: Right   ADLs: Overall ADLs: States decreased ability to grab, hold household objects, pain and difficulty to open containers, perform FMS tasks (manipulate fasteners on clothing), mild to moderate bathing problems as well.    FUNCTIONAL OUTCOME MEASURES: Eval: Patient Specific Functional Scale: 1.3 (weed wacker, open a jar, use utensils)  (Higher Score  =  Better Ability for  the Selected Tasks)      UPPER EXTREMITY ROM     Shoulder to Wrist AROM Right eval Rt 05/13/24  Forearm supination    Forearm pronation     Wrist flexion 49 53  Wrist extension 44 58  Wrist ulnar deviation    Wrist radial deviation    Functional dart thrower's motion (F-DTM) in ulnar flexion    F-DTM in radial extension     (Blank rows = not tested)   Hand AROM Right eval Rt 05/13/24  Full Fist Ability (or Gap to Distal Palmar Crease) 4.5cm gap from tip of SF to [facility]   Thumb Opposition  (Kapandji Scale)  6/10   Little MCP (0-90) 0-  44 0- 78  Little PIP (0-100) (-10) -  71 (-6) - 84  Little DIP (0-70) 0-  58 0- 66  (Blank rows = not tested)   UPPER EXTREMITY MMT:    Eval:  NT at eval due to recent and still healing injuries. Will be tested when appropriate.   MMT Right TBD  Shoulder flexion   Shoulder  abduction   Shoulder adduction   Shoulder extension   Shoulder internal rotation   Shoulder external rotation   Middle trapezius   Lower trapezius   Elbow flexion   Elbow extension   Forearm supination   Forearm pronation   Wrist flexion   Wrist extension   Wrist ulnar deviation   Wrist radial deviation   (Blank rows = not tested)  HAND FUNCTION: 05/13/24: Grip strength Right: 82 lbs, Left: 65 lbs   COORDINATION: 05/13/24: 9HPT: 30sec    Eval: Observed coordination impairments with affected right hand. 9 Hole Peg Test Right: 36sec (25 sec is WFL)   SENSATION: Eval:  Light touch intact today, though diminished around sx area in an ulnar nerve distribution distally.  IF, thumb, radial RF feel 2.83 (normal), but SF and ulnar RF can only feel 3.61 (diminished light touch)   EDEMA:   Eval:  Mildly swollen in right hand and wrist today  COGNITION: Eval: Overall cognitive status: WFL for evaluation today   OBSERVATIONS:   Eval: Surgical site covered with Steri-Strip and trigger finger release well-healing.  No signs of infection etc.  Typical amount of tenderness and also has some hypersensitivity through the ulnar nerve distribution distal to the surgical site now.   TODAY'S TREATMENT:  05/13/24: His wounds are looking great, and he was given a silicone scar pad to wear in the night over both surgical sites as tolerated.  He was given more compressive Tubigrip to help secure them on his hand in the night and help with swelling.  He does active range of motion for exercise and new measures which shows superb improvement in finger motion, and a bit better wrist motion as well.  We reviewed his entire home exercise program including nerve gliding and desensitization strategies, and then OT also educates on new stretching to do at the forearm and wrist as bolded below.  He was highly advised to do these things in a pain-free fashion, gently, to slowly improve passive and active range of  motion.  He tolerated them well with no pain today and also tolerates a very strong pain-free grip and shows improved fine motor skills when performing functional activity today.   Exercises - Ulnar Nerve Flossing  - 4-6 x daily - 5-10 reps - Reach arms upward   - 4 x daily - 10 reps - Turn [employer] Up &  Down  - 4-6 x daily - 10-15 reps - Bend and Pull Back Wrist SLOWLY  - 4 x daily - 10-15 reps - "Windshield Wipers"   - 4 x daily - 10-15 reps - Finger Spreading  - 4-6 x daily - 10-15 reps - Tendon Glides  - 4-6 x daily - 3-5 reps - 2-3 seconds hold - Thumb Opposition  - 4-6 x daily - 10 reps - Forearm Supination Stretch  - 3-4 x daily - 3-5 reps - 15 sec hold - Wrist Flexion Stretch  - 4 x daily - 3-5 reps - 15 sec hold - Wrist Prayer Stretch  - 4 x daily - 3-5 reps - 15 sec hold    PATIENT EDUCATION: Education details: See tx section above for details  Person educated: Patient Education method: Verbal Instruction, Teach back, Handouts  Education comprehension: States and demonstrates understanding, Additional Education required    HOME EXERCISE PROGRAM: Access Code: [email or web address] URL: [email or web address] Date: 05/06/2024 Prepared by: Leartis Proud   GOALS: Goals reviewed with patient? Yes   SHORT TERM GOALS: (STG required if POC>30 days) Target Date: 05/22/2024  Pt will obtain protective, custom orthotic. Goal status: TBD/PRN  2.  Pt will demo/state understanding of initial HEP to improve pain levels and prerequisite motion. Goal status: 05/13/24: MET   LONG TERM GOALS: Target Date: 06/19/24  Pt will improve functional ability by decreased impairment per PSFS assessment from 1.3 to 5 or better, for better quality of life. Goal status: INITIAL  2.  Pt will improve grip strength in right dominant hand hand from unsafe to test to at least 30 lbs for functional use at home and in IADLs. Goal status: INITIAL  3.  Pt will improve A/ROM in right wrist  flexion/extension from 49/44 respectively to at least 60 degrees each, to have functional motion for tasks like reach and grasp.  Goal status: INITIAL  4.  Pt will improve A/ROM in right small finger total active motion from 163 degrees to at least 200 degrees, to have functional motion for tasks like reach and grasp.  Goal status: 05/13/24: MET 222*   5.  Pt will improve strength in right wrist flexion/extension from apparent 3 -/5 MMT to at least 4+/5 MMT to have increased functional ability to carry out selfcare and higher-level homecare tasks with less difficulty. Goal status: INITIAL  5.  Pt will improve coordination skills in right dominant hand, as seen by within functional limit score on nine-hole peg testing to have increased functional ability to carry out fine motor tasks (fasteners, etc.) and more complex, coordinated IADLs (meal prep, sports, etc.).  Goal status: INITIAL  6.  Pt will decrease pain at worst from 5-6/10 to 2/10 or better to have better sleep and occupational participation in daily roles. Goal status: INITIAL    ASSESSMENT:  CLINICAL IMPRESSION: 05/13/24: Unfortunately he has had no major pain relief, but his motion and function are greatly improved now.  Nerves and pain relief we will take longer as nerves take longer to heal.  Patient is a 78y.o. male who was seen today for occupational therapy evaluation for stiffness, weakness, soreness, paresthesia, decreased ability with right dominant hand and arm due to recent trigger finger release of the small finger and Guyon's canal release with vascular repair.  He will benefit from outpatient occupational therapy to decrease symptoms and increase quality of life     PLAN:  OT FREQUENCY: 1-2x/week  OT DURATION: 6 weeks through 06/19/2024 and up  to 10 total visits as needed  PLANNED INTERVENTIONS: 97168 OT Re-evaluation, 97535 self care/ADL training, 16109 therapeutic exercise, 97530 therapeutic activity, 97112  neuromuscular re-education, 97140 manual therapy, 97035 ultrasound, 97760 Orthotic Initial, 97763 Orthotic/Prosthetic subsequent, scar mobilization, compression bandaging, energy conservation, coping strategies training, and patient/family education  RECOMMENDED OTHER SERVICES: None now  CONSULTED AND AGREED WITH PLAN OF CARE: Patient  PLAN FOR NEXT SESSION:   Continue on with mobility program and gentle strengthening starting at the wrist as needed and tolerated.  Continue to help with pain reduction techniques.  Leartis Proud, OTR/L, CHT 05/13/2024, 12:28 PM

## 2024-05-13 ENCOUNTER — Encounter: Payer: Self-pay | Admitting: Rehabilitative and Restorative Service Providers"

## 2024-05-13 ENCOUNTER — Ambulatory Visit: Admitting: Rehabilitative and Restorative Service Providers"

## 2024-05-13 DIAGNOSIS — M6281 Muscle weakness (generalized): Secondary | ICD-10-CM

## 2024-05-13 DIAGNOSIS — R202 Paresthesia of skin: Secondary | ICD-10-CM | POA: Diagnosis not present

## 2024-05-13 DIAGNOSIS — M25641 Stiffness of right hand, not elsewhere classified: Secondary | ICD-10-CM | POA: Diagnosis not present

## 2024-05-13 DIAGNOSIS — R600 Localized edema: Secondary | ICD-10-CM

## 2024-05-13 DIAGNOSIS — M79641 Pain in right hand: Secondary | ICD-10-CM

## 2024-05-13 DIAGNOSIS — R278 Other lack of coordination: Secondary | ICD-10-CM | POA: Diagnosis not present

## 2024-05-20 ENCOUNTER — Ambulatory Visit: Admitting: Rehabilitative and Restorative Service Providers"

## 2024-05-20 ENCOUNTER — Encounter: Payer: Self-pay | Admitting: Rehabilitative and Restorative Service Providers"

## 2024-05-20 DIAGNOSIS — R278 Other lack of coordination: Secondary | ICD-10-CM

## 2024-05-20 DIAGNOSIS — M6281 Muscle weakness (generalized): Secondary | ICD-10-CM

## 2024-05-20 DIAGNOSIS — R600 Localized edema: Secondary | ICD-10-CM | POA: Diagnosis not present

## 2024-05-20 DIAGNOSIS — M79641 Pain in right hand: Secondary | ICD-10-CM

## 2024-05-20 DIAGNOSIS — R202 Paresthesia of skin: Secondary | ICD-10-CM | POA: Diagnosis not present

## 2024-05-20 DIAGNOSIS — M25641 Stiffness of right hand, not elsewhere classified: Secondary | ICD-10-CM | POA: Diagnosis not present

## 2024-05-20 NOTE — Therapy (Signed)
 OUTPATIENT OCCUPATIONAL THERAPY TREATMENT NOTE  Patient Name: Brad Woods MRN: [id number] DOB:01/29/1946, 78 y.o., male Today's Date: 05/20/2024  PCP: Sally Crazier MD REFERRING PROVIDER:   Merrill Abide, MD    END OF SESSION:  OT End of Session - 05/20/24 1146     Visit Number 3    Number of Visits 10    Date for OT Re-Evaluation 06/19/24    Authorization Type [employer]    OT Start Time 1146    OT Stop Time 1224    OT Time Calculation (min) 38 min    Equipment Utilized During Treatment compressive gauze and silicone scar pads    Activity Tolerance Patient tolerated treatment well;No increased pain;Patient limited by fatigue;Patient limited by pain    Behavior During Therapy [facility] for tasks assessed/performed              Past Medical History:  Diagnosis Date   Allergy    Arthritis    Asthma    when younger   COPD (chronic obstructive pulmonary disease) (HCC)    Coronary artery disease    s/p stenting 10/2016   Dyspnea    History of kidney stones    15x   Hyperlipidemia    Hypertension    Myocardial infarction [facility])    Neuromuscular disorder (HCC)    neuropathy right hand   Pneumonia    As a child   Sleep apnea    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   Past Surgical History:  Procedure Laterality Date   BICEPT TENODESIS Right 10/22/2023   Procedure: BICEPS TENODESIS;  Surgeon: Jasmine Mesi, MD;  Location: [facility] OR;  Service: Orthopedics;  Laterality: Right;   CARDIAC CATHETERIZATION N/A 05/20/2016   Procedure: Left Heart Cath and Coronary Angiography;  Surgeon: Avanell Leigh, MD;  Location: 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, 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   INGUINAL HERNIA REPAIR  1955   LEFT HEART CATH AND CORONARY ANGIOGRAPHY N/A 05/31/2020   Procedure: LEFT HEART CATH AND CORONARY ANGIOGRAPHY;  Surgeon: Millicent Ally, MD;  Location: [facility] INVASIVE CV LAB;  Service: Cardiovascular;  Laterality: N/A;   RECTAL SURGERY     polyps and fissures removed   REPLACEMENT TOTAL KNEE Bilateral    REVERSE SHOULDER ARTHROPLASTY Right 10/22/2023   Procedure: RIGHT REVERSE SHOULDER ARTHROPLASTY;  Surgeon: Jasmine Mesi, MD;  Location: [facility] OR;  Service: Orthopedics;  Laterality: Right;   TONSILLECTOMY     TRIGGER FINGER RELEASE Right 04/24/2024   Procedure: RIGHT SMALL FINGER RELEASE TRIGGER FINGER/A-1 PULLEY;  Surgeon: Merrill Abide, MD;  Location: [facility];  Service: Orthopedics;  Laterality: Right;   Patient Active Problem List   Diagnosis Date Noted   Ulnar tunnel syndrome of right wrist 04/24/2024   Trigger finger, right little finger 04/24/2024   OA (osteoarthritis) of shoulder 10/22/2023   S/P reverse total shoulder arthroplasty, right 10/22/2023   Arthritis of right shoulder region 10/22/2023   Biceps  tendonitis, right 10/22/2023   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   Morbid obesity (HCC) 07/27/2020   Dizziness 06/22/2020   Exertional dyspnea    Chronic pain of left knee 09/02/2018   Chronic pain of right knee 09/02/2018   History of bilateral knee replacement 09/02/2018   Allergic rhinitis 08/15/2018   Erectile dysfunction 08/15/2018   History of renal stone 08/15/2018   Migraine 08/15/2018   Osteoarthritis 08/15/2018   Hyperlipidemia 08/15/2018   PVD (peripheral vascular disease) (HCC) 08/15/2018   Left shoulder pain 07/30/2018   Chest pain 07/30/2018   Trochanteric bursitis, left  hip 05/02/2017   Carpal tunnel syndrome, left upper limb 05/02/2017   Trochanteric bursitis, right hip 03/18/2017   Mixed hyperlipidemia 12/31/2016   Coronary artery disease involving native coronary artery of native heart with angina pectoris (HCC) 11/25/2016   Coronary artery disease involving native coronary artery of native heart without angina pectoris 11/20/2016   Elevated troponin    Unstable angina (HCC) 05/18/2016   Acute coronary syndrome (HCC) 05/18/2016   Benign non-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 10/12/2014   Essential hypertension 10/12/2014   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 10/12/2014   High risk medication use 10/12/2014   IFG (impaired fasting glucose) 10/12/2014   OSA (obstructive sleep apnea) 02/04/2014   Hypothyroidism 03/02/2013   GERD (gastroesophageal reflux disease) 09/04/2012    ONSET DATE: DOS 04/24/24  REFERRING DIAG: G56.21 (ICD-10-CM) - Ulnar tunnel syndrome of right wrist   THERAPY DIAG:  Pain in right hand  Stiffness of right hand, not elsewhere classified  Muscle weakness (generalized)  Localized edema  Other lack of coordination  Paresthesia of skin  Rationale for Evaluation and Treatment: Rehabilitation  PERTINENT HISTORY: Past shoulder surgery and ulnar nerve compression as well as triggering small finger. He states he had a shoulder surgery and afterward his hand felt numb in the ulnar nerve distribution.  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  He also had a chronic triggering small finger so he had a release of this as well.  He now describes swelling and pain and stiffness and hypersensitivity as a burning pain through his small finger and ulnar side of ring finger.  He does appear to be guarded from pain and holding his hand nervously outstretched today.   PRECAUTIONS: None  RED FLAGS: None   WEIGHT BEARING RESTRICTIONS: Yes nonweightbearing in right  hand and arm now   SUBJECTIVE:   SUBJECTIVE STATEMENT: Now ~4 weeks s/p Rt Guyon's canal release, aneurysm repair, and small finger trigger finger release.  He states his finger is feeling more painful and numb now unfortunately.  He is very happy with his motion now   PAIN:  Are you having pain? Yes: NPRS scale:   3-4/10 at rest, up to 5-6/10 at worst at night  Pain location: Right small finger and ring finger and surgical site Pain description: Burning and nerve pain Aggravating factors: Holding hand nervously outstretched Relieving factors: Rest  PATIENT GOALS: To improve pain in an, and use of right hand and arm.  NEXT MD VISIT: 05/21/2024   OBJECTIVE: (All objective assessments below are from initial evaluation on: 05/06/2024 unless otherwise specified.)   HAND DOMINANCE: Right   ADLs: Overall ADLs: States decreased ability to grab, hold household objects, pain and difficulty to open containers, perform FMS tasks (manipulate fasteners on clothing), mild to moderate bathing problems as well.    FUNCTIONAL OUTCOME MEASURES: Eval: Patient Specific Functional  Scale: 1.3 (weed wacker, open a jar, use utensils)  (Higher Score  =  Better Ability for the Selected Tasks)      UPPER EXTREMITY ROM     Shoulder to Wrist AROM Right eval Rt 05/13/24 Rt 05/20/24  Forearm supination   75  Forearm pronation    80  Wrist flexion 49 53 60  Wrist extension 44 58 65  Wrist ulnar deviation     Wrist radial deviation     Functional dart thrower's motion (F-DTM) in ulnar flexion     F-DTM in radial extension      (Blank rows = not tested)   Hand AROM Right eval Rt 05/13/24 Rt 05/20/24  Full Fist Ability (or Gap to Distal Palmar Crease) 4.5cm gap from tip of SF to [facility]  Full fist   Thumb Opposition  (Kapandji Scale)  6/10  8/10  Little MCP (0-90) 0-  44 0- 78 0 - 70  Little PIP (0-100) (-10) -  71 (-6) - 84 0 - 87  Little DIP (0-70) 0-  58 0- 66 0 - 78  (Blank rows = not  tested)   UPPER EXTREMITY MMT:    Eval:  NT at eval due to recent and still healing injuries. Will be tested when appropriate.   MMT Right TBD  Shoulder flexion   Shoulder abduction   Shoulder adduction   Shoulder extension   Shoulder internal rotation   Shoulder external rotation   Middle trapezius   Lower trapezius   Elbow flexion   Elbow extension   Forearm supination   Forearm pronation   Wrist flexion   Wrist extension   Wrist ulnar deviation   Wrist radial deviation   (Blank rows = not tested)  HAND FUNCTION: 05/13/24: Grip strength Right: 82 lbs, Left: 65 lbs   COORDINATION: 05/13/24: 9HPT: 30sec    Eval: Observed coordination impairments with affected right hand. 9 Hole Peg Test Right: 36sec (25 sec is WFL)   SENSATION: 05/20/24:  SF measures worse today 4.31, but ulnar side of RF measures better at 2.83 (normal)... interesting    Eval:  Light touch intact today, though diminished around sx area in an ulnar nerve distribution distally.  IF, thumb, radial RF feel 2.83 (normal), but SF and ulnar RF can only feel 3.61 (diminished light touch)   EDEMA:   Eval:  Mildly swollen in right hand and wrist today  COGNITION: Eval: Overall cognitive status: WFL for evaluation today   OBSERVATIONS:   Eval: Surgical site covered with Steri-Strip and trigger finger release well-healing.  No signs of infection etc.  Typical amount of tenderness and also has some hypersensitivity through the ulnar nerve distribution distal to the surgical site now.   TODAY'S TREATMENT:  05/20/24: OT helps him analyze his day and night routines to try to determine what would be making his finger feel more numb now.  After doing an elbow flexion test and talking about night postures, it seems clear that he is exacerbating his ulnar nerve in the night.  We discussed keeping his arm straighter in the night at the elbow and he is given multiple ways to do this including Coban wrapping a towel at  his elbow and he is given Coban.  We reviewed his nerve gliding and its modified slightly to get a better nerve tension.  We review his other home exercises and update them as below.  Each of these were performed with him and he also tolerates new wrist  and tricep strengthening today which he states he can feel through his nerve and tight muscles.  He states feeling a bit better as he leaves the session   Exercises - Ulnar Nerve Flossing  - 4-6 x daily - 5-10 reps - Forearm Supination Stretch  - 3-4 x daily - 3-5 reps - 15 sec hold - Wrist Prayer Stretch  - 4 x daily - 3-5 reps - 15 sec hold - Seated Finger Composite Flexion Stretch  - 4 x daily - 3-5 reps - 15 hold - Tendon Glides  - 4-6 x daily - 3-5 reps - 2-3 seconds hold - Wrist Flexion with Resistance  - 2-3 x daily - 1-2 sets - 10-15 reps - Wrist Extension with Resistance  - 2-3 x daily - 1-2 sets - 10-15 reps - Standing Elbow Extension with Self-Anchored Resistance  - 2-3 x daily - 1-2 sets - 10-15 reps    PATIENT EDUCATION: Education details: See tx section above for details  Person educated: Patient Education method: Verbal Instruction, Teach back, Handouts  Education comprehension: States and demonstrates understanding, Additional Education required    HOME EXERCISE PROGRAM: Access Code: [email or web address] URL: [email or web address] Date: 05/06/2024 Prepared by: Leartis Proud   GOALS: Goals reviewed with patient? Yes   SHORT TERM GOALS: (STG required if POC>30 days) Target Date: 05/22/2024  Pt will obtain protective, custom orthotic. Goal status: TBD/PRN  2.  Pt will demo/state understanding of initial HEP to improve pain levels and prerequisite motion. Goal status: 05/13/24: MET   LONG TERM GOALS: Target Date: 06/19/24  Pt will improve functional ability by decreased impairment per PSFS assessment from 1.3 to 5 or better, for better quality of life. Goal status: INITIAL  2.  Pt will improve grip  strength in right dominant hand hand from unsafe to test to at least 30 lbs for functional use at home and in IADLs. Goal status: INITIAL  3.  Pt will improve A/ROM in right wrist flexion/extension from 49/44 respectively to at least 60 degrees each, to have functional motion for tasks like reach and grasp.  Goal status: INITIAL  4.  Pt will improve A/ROM in right small finger total active motion from 163 degrees to at least 200 degrees, to have functional motion for tasks like reach and grasp.  Goal status: 05/13/24: MET 222*   5.  Pt will improve strength in right wrist flexion/extension from apparent 3 -/5 MMT to at least 4+/5 MMT to have increased functional ability to carry out selfcare and higher-level homecare tasks with less difficulty. Goal status: INITIAL  5.  Pt will improve coordination skills in right dominant hand, as seen by within functional limit score on nine-hole peg testing to have increased functional ability to carry out fine motor tasks (fasteners, etc.) and more complex, coordinated IADLs (meal prep, sports, etc.).  Goal status: INITIAL  6.  Pt will decrease pain at worst from 5-6/10 to 2/10 or better to have better sleep and occupational participation in daily roles. Goal status: INITIAL    ASSESSMENT:  CLINICAL IMPRESSION: 05/20/24: Unfortunately his ulnar nerve is being impinged in the night at the elbow.  We discussed this and hopefully it helps him feel better.  If not, this could be a bigger problem related to the shoulder or shoulder surgery or perhaps scar tissue from the recent Guyon's release.  Otherwise he is doing fantastic and making great improvements in all other regards      PLAN:  OT FREQUENCY: 1-2x/week  OT DURATION: 6 weeks through 06/19/2024 and up to 10 total visits as needed  PLANNED INTERVENTIONS: 97168 OT Re-evaluation, 97535 self care/ADL training, 19147 therapeutic exercise, 97530 therapeutic activity, 97112 neuromuscular re-education,  97140 manual therapy, 97035 ultrasound, 97760 Orthotic Initial, 97763 Orthotic/Prosthetic subsequent, scar mobilization, compression bandaging, energy conservation, coping strategies training, and patient/family education  RECOMMENDED OTHER SERVICES: None now  CONSULTED AND AGREED WITH PLAN OF CARE: Patient  PLAN FOR NEXT SESSION:   Check on numbness after changing sleep routines, check strength  Leartis Proud, OTR/L, CHT 05/20/2024, 12:47 PM

## 2024-05-21 ENCOUNTER — Ambulatory Visit: Admitting: Orthopedic Surgery

## 2024-05-21 DIAGNOSIS — G5621 Lesion of ulnar nerve, right upper limb: Secondary | ICD-10-CM

## 2024-05-21 DIAGNOSIS — M65351 Trigger finger, right little finger: Secondary | ICD-10-CM

## 2024-05-21 NOTE — Progress Notes (Signed)
   Brad Woods - 78 y.o. male MRN [id number]  Date of birth: 11-Feb-1946  Office Visit Note: Visit Date: 05/21/2024 PCP: Wyn Heater, MD Referred by: Wyn Heater, MD  Subjective:  HPI: Brad Woods is a 78 y.o. 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  He continues to have persistent numbness within the small finger, ring finger ulnar aspect has improved sensation.  Range of motion of the small finger is appropriate, no residual clicking or locking.  Pertinent ROS were reviewed with the patient and found to be negative unless otherwise specified above in HPI.   Assessment & Plan: Visit Diagnoses:  1. Ulnar tunnel syndrome of right wrist   2. Trigger little finger of right hand     Plan: Extensive discussion was once again had with the patient today regarding his right wrist and hand.  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  On examination today however there is improvement in the ulnar aspect of the ring finger from a sensation standpoint, he is appropriate 2 point discrimination in this distribution.  The small finger remains quite numb to both light touch and 2-point discrimination, however I did also explain that we worked on the small finger trigger digit in this region as well meaning that the digital nerves did undergo a process of retraction at this level as well.  Will continue to take a watch and wait approach, he is encouraged to continue with occupational therapy for both range of motion and progression to strengthening as tolerated.  I will plan on seeing him back in approximate 6 weeks to track his progress.  Follow-up: No follow-ups on  file.   Meds & Orders: No orders of the defined types were placed in this encounter.  No orders of the defined types were placed in this encounter.    Procedures: No procedures performed       Objective:   Vital Signs: There were no vitals taken for this visit.  Ortho Exam Right wrist and hand: - Well-healed incisional site over the ulnar aspect of the wrist, no erythema or drainage - Wrist range of motion flexion/extension 45/35, improved passively - Composite fist without significant restriction, small finger without residual clicking or locking, well-healed incision at the basilar aspect of the small finger - 2 point discrimination in the small finger is indiscernible, ulnar aspect of the ring finger 2-point discrimination is between 5 and 6 mm - Hand remains warm well-perfused  Imaging: No results found.   Brad Woods, M.D. [facility], Hand Surgery

## 2024-05-26 NOTE — Therapy (Signed)
 OUTPATIENT OCCUPATIONAL THERAPY TREATMENT NOTE  Patient Name: Brad Woods MRN: [id number] DOB:25-Apr-1946, 78 y.o., male Today's Date: 05/27/2024  PCP: Sally Crazier MD REFERRING PROVIDER:   Merrill Abide, MD    END OF SESSION:  OT End of Session - 05/27/24 1151     Visit Number 4    Number of Visits 10    Date for OT Re-Evaluation 06/19/24    Authorization Type [employer]    OT Start Time 1152    OT Stop Time 1222    OT Time Calculation (min) 30 min    Activity Tolerance Patient tolerated treatment well;No increased pain;Patient limited by fatigue;Patient limited by pain    Behavior During Therapy [facility] for tasks assessed/performed               Past Medical History:  Diagnosis Date   Allergy    Arthritis    Asthma    when younger   COPD (chronic obstructive pulmonary disease) (HCC)    Coronary artery disease    s/p stenting 10/2016   Dyspnea    History of kidney stones    15x   Hyperlipidemia    Hypertension    Myocardial infarction [facility])    Neuromuscular disorder (HCC)    neuropathy right hand   Pneumonia    As a child   Sleep apnea    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   Past Surgical History:  Procedure Laterality Date   BICEPT TENODESIS Right 10/22/2023   Procedure: BICEPS TENODESIS;  Surgeon: Jasmine Mesi, MD;  Location: [facility] OR;  Service: Orthopedics;  Laterality: Right;   CARDIAC CATHETERIZATION N/A 05/20/2016   Procedure: Left Heart Cath and Coronary Angiography;  Surgeon: Avanell Leigh, MD;  Location: [facility] INVASIVE CV LAB;  Service: Cardiovascular;  Laterality: N/A;   CARDIAC CATHETERIZATION N/A 11/22/2016   Procedure: Left Heart Cath and Coronary Angiography;  Surgeon: Sammy Crisp, MD;  Location: [facility] INVASIVE CV LAB;  Service: Cardiovascular;  Laterality: N/A;   CARPAL TUNNEL RELEASE Right    CHOLECYSTECTOMY N/A 03/10/2024   Procedure: LAPAROSCOPIC CHOLECYSTECTOMY;  Surgeon: Kinsinger, Alphonso Aschoff, MD;  Location:  [facility] OR;  Service: General;  Laterality: N/A;   COLONOSCOPY     EYE SURGERY Left    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, 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   INGUINAL HERNIA REPAIR  1955   LEFT HEART CATH AND CORONARY ANGIOGRAPHY N/A 05/31/2020   Procedure: LEFT HEART CATH AND CORONARY ANGIOGRAPHY;  Surgeon: Millicent Ally, MD;  Location: [facility] INVASIVE CV LAB;  Service: Cardiovascular;  Laterality: N/A;   RECTAL SURGERY     polyps and fissures removed   REPLACEMENT TOTAL KNEE Bilateral    REVERSE SHOULDER ARTHROPLASTY Right 10/22/2023   Procedure: RIGHT REVERSE SHOULDER ARTHROPLASTY;  Surgeon: Jasmine Mesi, MD;  Location: [facility] OR;  Service: Orthopedics;  Laterality: Right;   TONSILLECTOMY     TRIGGER FINGER RELEASE Right 04/24/2024   Procedure: RIGHT SMALL FINGER RELEASE TRIGGER FINGER/A-1 PULLEY;  Surgeon: Merrill Abide, MD;  Location: [facility];  Service: Orthopedics;  Laterality: Right;   Patient Active Problem List   Diagnosis Date Noted   Ulnar tunnel syndrome of right wrist 04/24/2024   Trigger finger, right little finger 04/24/2024   OA (osteoarthritis) of shoulder 10/22/2023   S/P reverse total shoulder arthroplasty, right 10/22/2023   Arthritis of right shoulder region 10/22/2023   Biceps tendonitis, right 10/22/2023   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   Morbid obesity (  HCC) 07/27/2020   Dizziness 06/22/2020   Exertional dyspnea    Chronic pain of left knee 09/02/2018   Chronic pain of right knee 09/02/2018   History of bilateral knee replacement 09/02/2018   Allergic rhinitis 08/15/2018   Erectile dysfunction 08/15/2018   History of renal stone 08/15/2018   Migraine 08/15/2018   Osteoarthritis 08/15/2018   Hyperlipidemia 08/15/2018   PVD (peripheral vascular disease) (HCC) 08/15/2018   Left shoulder pain 07/30/2018   Chest pain 07/30/2018   Trochanteric bursitis, left hip 05/02/2017   Carpal tunnel syndrome, left upper limb 05/02/2017    Trochanteric bursitis, right hip 03/18/2017   Mixed hyperlipidemia 12/31/2016   Coronary artery disease involving native coronary artery of native heart with angina pectoris (HCC) 11/25/2016   Coronary artery disease involving native coronary artery of native heart without angina pectoris 11/20/2016   Elevated troponin    Unstable angina (HCC) 05/18/2016   Acute coronary syndrome (HCC) 05/18/2016   Benign non-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 10/12/2014   Essential hypertension 10/12/2014   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 10/12/2014   High risk medication use 10/12/2014   IFG (impaired fasting glucose) 10/12/2014   OSA (obstructive sleep apnea) 02/04/2014   Hypothyroidism 03/02/2013   GERD (gastroesophageal reflux disease) 09/04/2012    ONSET DATE: DOS 04/24/24  REFERRING DIAG: G56.21 (ICD-10-CM) - Ulnar tunnel syndrome of right wrist   THERAPY DIAG:  Pain in right hand  Stiffness of right hand, not elsewhere classified  Other lack of coordination  Localized edema  Rationale for Evaluation and Treatment: Rehabilitation  PERTINENT HISTORY: Past shoulder surgery and ulnar nerve compression as well as triggering small finger. He states he had a shoulder surgery and afterward his hand felt numb in the ulnar nerve distribution.  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  He also had a chronic triggering small finger so he had a release of this as well.  He now describes swelling and pain and stiffness and hypersensitivity as a burning pain through his small finger and ulnar side of ring finger.  He does appear to be guarded from pain and holding his hand nervously outstretched today.   PRECAUTIONS: None  RED FLAGS: None   WEIGHT BEARING RESTRICTIONS: Yes nonweightbearing in right hand and arm now   SUBJECTIVE:   SUBJECTIVE STATEMENT: Now ~5 weeks s/p Rt Guyon's canal release, aneurysm repair, and small  finger trigger finger release.  He states generally feeling less pain, but also noticing that sometimes after exercises he gets a sharp burning pain in his small finger.  He describes it like a traveling Aveline nerve pain.  OT advises him about this   PAIN:  Are you having pain? Yes: NPRS scale:   2-3/10 at rest, up to 4/10 at worst at night  Pain location: Right small finger and ring finger and surgical site Pain description: Burning and nerve pain Aggravating factors: Holding hand nervously outstretched Relieving factors: Rest  PATIENT GOALS: To improve pain in an, and use of right hand and arm.  NEXT MD VISIT: 05/21/2024   OBJECTIVE: (All objective assessments below are from initial evaluation on: 05/06/2024 unless otherwise specified.)   HAND DOMINANCE: Right   ADLs: Overall ADLs: States decreased ability to grab, hold household objects, pain and difficulty to open containers, perform FMS tasks (manipulate fasteners on clothing), mild to moderate bathing problems as well.    FUNCTIONAL OUTCOME MEASURES: Eval: Patient Specific Functional Scale: 1.3 (  weed wacker, open a jar, use utensils)  (Higher Score  =  Better Ability for the Selected Tasks)      UPPER EXTREMITY ROM     Shoulder to Wrist AROM Right eval Rt 05/13/24 Rt 05/20/24 Rt 05/27/24  Forearm supination   75   Forearm pronation    80   Wrist flexion 49 53 60 60  Wrist extension 44 58 65 64  Wrist ulnar deviation      Wrist radial deviation      Functional dart thrower's motion (F-DTM) in ulnar flexion      F-DTM in radial extension       (Blank rows = not tested)   Hand AROM Right eval Rt 05/13/24 Rt 05/20/24 Rt 05/27/24  Full Fist Ability (or Gap to Distal Palmar Crease) 4.5cm gap from tip of SF to [facility]  Full fist  Full fist  Thumb Opposition  (Kapandji Scale)  6/10  8/10 8/10  Little MCP (0-90) 0-  44 0- 78 0 - 70 0 - 78  Little PIP (0-100) (-10) -  71 (-6) - 84 0 - 87 0 - 90  Little DIP (0-70) 0-  58 0- 66 0  - 78 0 - 78  (Blank rows = not tested)   UPPER EXTREMITY MMT:     MMT Right 05/27/24  Shoulder flexion   Shoulder abduction   Shoulder adduction   Shoulder extension   Shoulder internal rotation   Shoulder external rotation   Middle trapezius   Lower trapezius   Elbow flexion   Elbow extension   Forearm supination   Forearm pronation   Wrist flexion 4+/5  Wrist extension 5/5  Wrist ulnar deviation   Wrist radial deviation   (Blank rows = not tested)  HAND FUNCTION: 05/27/24: grip Rt: 96#  05/13/24: Grip strength Right: 82 lbs, Left: 65 lbs   COORDINATION: 05/13/24: 9HPT: 30sec    Eval: Observed coordination impairments with affected right hand. 9 Hole Peg Test Right: 36sec (25 sec is WFL)   SENSATION: 05/27/24: Ulnar nerve measures 3.61 today in SF, RF   05/20/24:  SF measures worse today 4.31, but ulnar side of RF measures better at 2.83 (normal)... interesting   Eval:  Light touch intact today, though diminished around sx area in an ulnar nerve distribution distally. IF, thumb, radial RF feel 2.83 (normal), but SF and ulnar RF can only feel 3.61 (diminished light touch)   EDEMA:   Eval:  Mildly swollen in right hand and wrist today  OBSERVATIONS:   Eval: Surgical site covered with Steri-Strip and trigger finger release well-healing.  No signs of infection etc.  Typical amount of tenderness and also has some hypersensitivity through the ulnar nerve distribution distal to the surgical site now.   TODAY'S TREATMENT:  05/27/24: He performs active range of motion for exercise as well as new measures which shows no significant improvement at rest of the last week, but significant improvement in the hand.  His sensation and grip strength continue to improve which are also excellent healing signs.  He has some mild weakness and tenderness in the wrist flexion but none in wrist extension now.  We reviewed his home exercise program and updated it to the list below.  OT emphasizes  nonforceful stretches every day, tolerable resistance training perhaps every other day.  To help manage newer complaints of "sharp, burning" pain, we discussed nerve gliding, desensitization techniques again as OT is fairly confident that this represents some nerve  irritation or on a positive note his nerve healing.    Exercises - Ulnar Nerve Flossing  - 4-6 x daily - 5-10 reps - Wrist Prayer Stretch  - 4 x daily - 3-5 reps - 15 sec hold - PUSH KNUCKLES DOWN  - 4 x daily - 3-5 reps - 15 seconds hold - Tendon Glides  - 4-6 x daily - 3-5 reps - 2-3 seconds hold - Wrist Flexion with Resistance  - 2 x daily - 4-5 x weekly - 1-2 sets - 10-15 reps - Wrist Extension with Resistance  - 2-3 x daily - 1-2 sets - 10-15 reps - Standing Elbow Extension with Self-Anchored Resistance  - 2-3 x daily - 1-2 sets - 10-15 reps    PATIENT EDUCATION: Education details: See tx section above for details  Person educated: Patient Education method: Engineer, structural, Teach back, Handouts  Education comprehension: States and demonstrates understanding, Additional Education required    HOME EXERCISE PROGRAM: Access Code: [email or web address] URL: [email or web address] Date: 05/06/2024 Prepared by: Leartis Proud   GOALS: Goals reviewed with patient? Yes   SHORT TERM GOALS: (STG required if POC>30 days) Target Date: 05/22/2024  Pt will obtain protective, custom orthotic. Goal status: TBD/PRN  2.  Pt will demo/state understanding of initial HEP to improve pain levels and prerequisite motion. Goal status: 05/13/24: MET   LONG TERM GOALS: Target Date: 06/19/24  Pt will improve functional ability by decreased impairment per PSFS assessment from 1.3 to 5 or better, for better quality of life. Goal status: INITIAL  2.  Pt will improve grip strength in right dominant hand hand from unsafe to test to at least 30 lbs for functional use at home and in IADLs. Goal status: INITIAL  3.  Pt will improve  A/ROM in right wrist flexion/extension from 49/44 respectively to at least 60 degrees each, to have functional motion for tasks like reach and grasp.  Goal status: INITIAL  4.  Pt will improve A/ROM in right small finger total active motion from 163 degrees to at least 200 degrees, to have functional motion for tasks like reach and grasp.  Goal status: 05/13/24: MET 222*   5.  Pt will improve strength in right wrist flexion/extension from apparent 3 -/5 MMT to at least 4+/5 MMT to have increased functional ability to carry out selfcare and higher-level homecare tasks with less difficulty. Goal status: INITIAL  5.  Pt will improve coordination skills in right dominant hand, as seen by within functional limit score on nine-hole peg testing to have increased functional ability to carry out fine motor tasks (fasteners, etc.) and more complex, coordinated IADLs (meal prep, sports, etc.).  Goal status: INITIAL  6.  Pt will decrease pain at worst from 5-6/10 to 2/10 or better to have better sleep and occupational participation in daily roles. Goal status: INITIAL    ASSESSMENT:  CLINICAL IMPRESSION: 05/27/24: He is doing very good and is very strong and is having less pain, but still some nerve issues of numbness and occasional burning pain.  OT will look at his shoulder in the next session and see if there is anything glaring there that could be affecting this.  05/20/24: Unfortunately his ulnar nerve is being impinged in the night at the elbow.  We discussed this and hopefully it helps him feel better.  If not, this could be a bigger problem related to the shoulder or shoulder surgery or perhaps scar tissue from the recent Guyon's release.  Otherwise he is doing  fantastic and making great improvements in all other regards      PLAN:  OT FREQUENCY: 1-2x/week  OT DURATION: 6 weeks through 06/19/2024 and up to 10 total visits as needed  PLANNED INTERVENTIONS: 97168 OT Re-evaluation, 97535 self  care/ADL training, 16109 therapeutic exercise, 97530 therapeutic activity, 97112 neuromuscular re-education, 97140 manual therapy, 97035 ultrasound, 97760 Orthotic Initial, 97763 Orthotic/Prosthetic subsequent, scar mobilization, compression bandaging, energy conservation, coping strategies training, and patient/family education  RECOMMENDED OTHER SERVICES: None now  CONSULTED AND AGREED WITH PLAN OF CARE: Patient  PLAN FOR NEXT SESSION:   Check for tightness in the shoulder girdle and give appropriate exercises as needed.  Once symptoms and sensation are improving consider discharge   Leartis Proud, OTR/L, CHT 05/27/2024, 12:31 PM

## 2024-05-27 ENCOUNTER — Encounter: Payer: Self-pay | Admitting: Rehabilitative and Restorative Service Providers"

## 2024-05-27 ENCOUNTER — Ambulatory Visit (INDEPENDENT_AMBULATORY_CARE_PROVIDER_SITE_OTHER): Admitting: Rehabilitative and Restorative Service Providers"

## 2024-05-27 DIAGNOSIS — M79641 Pain in right hand: Secondary | ICD-10-CM | POA: Diagnosis not present

## 2024-05-27 DIAGNOSIS — R600 Localized edema: Secondary | ICD-10-CM | POA: Diagnosis not present

## 2024-05-27 DIAGNOSIS — R278 Other lack of coordination: Secondary | ICD-10-CM

## 2024-05-27 DIAGNOSIS — M25641 Stiffness of right hand, not elsewhere classified: Secondary | ICD-10-CM | POA: Diagnosis not present

## 2024-06-02 NOTE — Therapy (Signed)
 OUTPATIENT OCCUPATIONAL THERAPY TREATMENT NOTE  Patient Name: Brad Woods MRN: [id number] DOB:03-08-1946, 78 y.o., male Today's Date: 06/03/2024  PCP: Sally Crazier MD REFERRING PROVIDER:   Merrill Abide, MD    END OF SESSION:  OT End of Session - 06/03/24 1103     Visit Number 5    Number of Visits 10    Date for OT Re-Evaluation 06/19/24    Authorization Type [employer]    OT Start Time 1103    OT Stop Time 1148    OT Time Calculation (min) 45 min    Activity Tolerance Patient tolerated treatment well;No increased pain;Patient limited by fatigue;Patient limited by pain    Behavior During Therapy [facility] for tasks assessed/performed                Past Medical History:  Diagnosis Date   Allergy    Arthritis    Asthma    when younger   COPD (chronic obstructive pulmonary disease) (HCC)    Coronary artery disease    s/p stenting 10/2016   Dyspnea    History of kidney stones    15x   Hyperlipidemia    Hypertension    Myocardial infarction [facility])    Neuromuscular disorder (HCC)    neuropathy right hand   Pneumonia    As a child   Sleep apnea    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   Past Surgical History:  Procedure Laterality Date   BICEPT TENODESIS Right 10/22/2023   Procedure: BICEPS TENODESIS;  Surgeon: Jasmine Mesi, MD;  Location[facility] OR;  Service: Orthopedics;  Laterality: Right;   CARDIAC CATHETERIZATION N/A 05/20/2016   Procedure: Left Heart Cath and Coronary Angiography;  Surgeon: Avanell Leigh, MD;  [facility] LAB;  Service: Cardiovascular;  Laterality: N/A;   CARDIAC CATHETERIZATION N/A 11/22/2016   Procedure: Left Heart Cath and Coronary Angiography;  Surgeon: Sammy Crisp, MD;  Location: [facility] INVASIVE CV LAB;  Service: Cardiovascular;  Laterality: N/A;   CARPAL TUNNEL RELEASE Right    CHOLECYSTECTOMY N/A 03/10/2024   Procedure: LAPAROSCOPIC CHOLECYSTECTOMY;  Surgeon: Kinsinger, Alphonso Aschoff, MD;   Location: [facility] OR;  Service: General;  Laterality: N/A;   COLONOSCOPY     EYE SURGERY Left    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, 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   INGUINAL HERNIA REPAIR  1955   LEFT HEART CATH AND CORONARY ANGIOGRAPHY N/A 05/31/2020   Procedure: LEFT HEART CATH AND CORONARY ANGIOGRAPHY;  Surgeon: Millicent Ally, MD;  Location: [facility] INVASIVE CV LAB;  Service: Cardiovascular;  Laterality: N/A;   RECTAL SURGERY     polyps and fissures removed   REPLACEMENT TOTAL KNEE Bilateral    REVERSE SHOULDER ARTHROPLASTY Right 10/22/2023   Procedure: RIGHT REVERSE SHOULDER ARTHROPLASTY;  Surgeon: Jasmine Mesi, MD;  Location: [facility] OR;  Service: Orthopedics;  Laterality: Right;   TONSILLECTOMY     TRIGGER FINGER RELEASE Right 04/24/2024   Procedure: RIGHT SMALL FINGER RELEASE TRIGGER FINGER/A-1 PULLEY;  Surgeon: Merrill Abide, MD;  Location: [facility];  Service: Orthopedics;  Laterality: Right;   Patient Active Problem List   Diagnosis Date Noted   Ulnar tunnel syndrome of right wrist 04/24/2024   Trigger finger, right little finger 04/24/2024   OA (osteoarthritis) of shoulder 10/22/2023   S/P reverse total shoulder arthroplasty, right 10/22/2023   Arthritis of right shoulder region 10/22/2023   Biceps tendonitis, right 10/22/2023   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   Morbid  obesity (HCC) 07/27/2020   Dizziness 06/22/2020   Exertional dyspnea    Chronic pain of left knee 09/02/2018   Chronic pain of right knee 09/02/2018   History of bilateral knee replacement 09/02/2018   Allergic rhinitis 08/15/2018   Erectile dysfunction 08/15/2018   History of renal stone 08/15/2018   Migraine 08/15/2018   Osteoarthritis 08/15/2018   Hyperlipidemia 08/15/2018   PVD (peripheral vascular disease) (HCC) 08/15/2018   Left shoulder pain 07/30/2018   Chest pain 07/30/2018   Trochanteric bursitis, left hip 05/02/2017   Carpal tunnel syndrome, left upper limb 05/02/2017    Trochanteric bursitis, right hip 03/18/2017   Mixed hyperlipidemia 12/31/2016   Coronary artery disease involving native coronary artery of native heart with angina pectoris (HCC) 11/25/2016   Coronary artery disease involving native coronary artery of native heart without angina pectoris 11/20/2016   Elevated troponin    Unstable angina (HCC) 05/18/2016   Acute coronary syndrome (HCC) 05/18/2016   Benign non-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 10/12/2014   Essential hypertension 10/12/2014   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 10/12/2014   High risk medication use 10/12/2014   IFG (impaired fasting glucose) 10/12/2014   OSA (obstructive sleep apnea) 02/04/2014   Hypothyroidism 03/02/2013   GERD (gastroesophageal reflux disease) 09/04/2012    ONSET DATE: DOS 04/24/24  REFERRING DIAG: G56.21 (ICD-10-CM) - Ulnar tunnel syndrome of right wrist   THERAPY DIAG:  Pain in right hand  Stiffness of right hand, not elsewhere classified  Other lack of coordination  Localized edema  Muscle weakness (generalized)  Paresthesia of skin  Rationale for Evaluation and Treatment: Rehabilitation  PERTINENT HISTORY: Past shoulder surgery and ulnar nerve compression as well as triggering small finger. He states he had a shoulder surgery and afterward his hand felt numb in the ulnar nerve distribution.  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  He also had a chronic triggering small finger so he had a release of this as well.  He now describes swelling and pain and stiffness and hypersensitivity as a burning pain through his small finger and ulnar side of ring finger.  He does appear to be guarded from pain and holding his hand nervously outstretched today.   PRECAUTIONS: None  RED FLAGS: None   WEIGHT BEARING RESTRICTIONS: Yes nonweightbearing in right hand and arm now   SUBJECTIVE:   SUBJECTIVE STATEMENT: Now ~6  weeks s/p Rt Guyon's canal release, aneurysm repair, and small finger trigger finger release.  He states continued pain with opening his hand through hypothenar eminence.     PAIN:  Are you having pain? Yes: NPRS scale:   2-3/10 at rest, up to 4/10 at worst at night  Pain location: Right small finger and ring finger and surgical site Pain description: Burning and nerve pain Aggravating factors: Holding hand nervously outstretched Relieving factors: Rest  PATIENT GOALS: To improve pain in an, and use of right hand and arm.  NEXT MD VISIT: 05/21/2024   OBJECTIVE: (All objective assessments below are from initial evaluation on: 05/06/2024 unless otherwise specified.)   HAND DOMINANCE: Right   ADLs: Overall ADLs: States decreased ability to grab, hold household objects, pain and difficulty to open containers, perform FMS tasks (manipulate fasteners on clothing), mild to moderate bathing problems as well.    FUNCTIONAL OUTCOME MEASURES: Eval: Patient Specific Functional Scale: 1.3 (weed wacker, open a jar, use utensils)  (Higher Score  =  Better Ability for the  Selected Tasks)      UPPER EXTREMITY ROM     Shoulder to Wrist AROM Right eval Rt 05/13/24 Rt 05/20/24 Rt 05/27/24 Rt 06/03/24  Forearm supination   75    Forearm pronation    80    Wrist flexion 49 53 60 60   Wrist extension 44 58 65 64   Sh Flex     109  Sh Abd     108  Sh ext     55  Sh ER     45  Sh IR     40  (Blank rows = not tested)   Hand AROM Right eval Rt 05/13/24 Rt 05/20/24 Rt 05/27/24  Full Fist Ability (or Gap to Distal Palmar Crease) 4.5cm gap from tip of SF to [facility]  Full fist  Full fist  Thumb Opposition  (Kapandji Scale)  6/10  8/10 8/10  Little MCP (0-90) 0-  44 0- 78 0 - 70 0 - 78  Little PIP (0-100) (-10) -  71 (-6) - 84 0 - 87 0 - 90  Little DIP (0-70) 0-  58 0- 66 0 - 78 0 - 78  (Blank rows = not tested)   UPPER EXTREMITY MMT:     MMT Right 05/27/24  Shoulder flexion   Shoulder abduction    Shoulder adduction   Shoulder extension   Shoulder internal rotation   Shoulder external rotation   Middle trapezius   Lower trapezius   Elbow flexion   Elbow extension   Forearm supination   Forearm pronation   Wrist flexion 4+/5  Wrist extension 5/5  Wrist ulnar deviation   Wrist radial deviation   (Blank rows = not tested)  HAND FUNCTION: 05/27/24: grip Rt: 96#  05/13/24: Grip strength Right: 82 lbs, Left: 65 lbs   COORDINATION: 05/13/24: 9HPT: 30sec    Eval: Observed coordination impairments with affected right hand. 9 Hole Peg Test Right: 36sec (25 sec is WFL)   SENSATION: 06/03/24: Rt SF: 2.83; RF: 2.83;   SF was 13-45mm gap on S2PD testing today   05/27/24: Ulnar nerve measures 3.61 today in SF, RF   05/20/24:  SF measures worse today 4.31, but ulnar side of RF measures better at 2.83 (normal)... interesting   Eval:  Light touch intact today, though diminished around sx area in an ulnar nerve distribution distally. IF, thumb, radial RF feel 2.83 (normal), but SF and ulnar RF can only feel 3.61 (diminished light touch)   EDEMA:   Eval:  Mildly swollen in right hand and wrist today  OBSERVATIONS:   Eval: Surgical site covered with Steri-Strip and trigger finger release well-healing.  No signs of infection etc.  Typical amount of tenderness and also has some hypersensitivity through the ulnar nerve distribution distal to the surgical site now.   TODAY'S TREATMENT:  06/03/24: Today OT upgrades his HEP to include isometric gripping and pinching, and also educates on new shoulder mobility stretches as this could also be impinging on his nerve somewhat.  He performs active range of motion for exercise and new measures for the shoulder to help guide this process.  Each of these bolded items below were reviewed with him, performed with him to ensure they were not painful and that he was successful.  He was asked to add these into his program several times a day and most of the  week to continue to recover from surgery and help improve his nerve.  Nervous sensation checked today also  shows encouraging improvements.     Exercises - Ulnar Nerve Flossing  - 4-6 x daily - 5-10 reps - Wrist Prayer Stretch  - 4 x daily - 3-5 reps - 15 sec hold - PUSH KNUCKLES DOWN  - 4 x daily - 3-5 reps - 15 seconds hold - Tendon Glides  - 4-6 x daily - 3-5 reps - 2-3 seconds hold - Wrist Flexion with Resistance  - 2 x daily - 4-5 x weekly - 1-2 sets - 10-15 reps - Wrist Extension with Resistance  - 2-3 x daily - 1-2 sets - 10-15 reps - Standing Elbow Extension with Self-Anchored Resistance  - 2-3 x daily - 1-2 sets - 10-15 reps - Full Fist  - 2 x daily - 7 x weekly - 5 reps - 10 sec hold - Thumb Opposition with Putty  - 2 x daily - 7 x weekly - 5 reps - 10sec hold - Wall Climb  - 1-2 x daily - 3 reps - 15 sec hold - Doorway Stretches  - 1-2 x daily - 3 reps - 15 sec hold - Sleeper Stretch  - 1-2 x daily - 3 reps - 15 sec hold     05/27/24: He performs active range of motion for exercise as well as new measures which shows no significant improvement at rest of the last week, but significant improvement in the hand.  His sensation and grip strength continue to improve which are also excellent healing signs.  He has some mild weakness and tenderness in the wrist flexion but none in wrist extension now.  We reviewed his home exercise program and updated it to the list below.  OT emphasizes nonforceful stretches every day, tolerable resistance training perhaps every other day.  To help manage newer complaints of sharp, burning pain, we discussed nerve gliding, desensitization techniques again as OT is fairly confident that this represents some nerve irritation or on a positive note his nerve healing.    Exercises - Ulnar Nerve Flossing  - 4-6 x daily - 5-10 reps - Wrist Prayer Stretch  - 4 x daily - 3-5 reps - 15 sec hold - PUSH KNUCKLES DOWN  - 4 x daily - 3-5 reps - 15 seconds hold -  Tendon Glides  - 4-6 x daily - 3-5 reps - 2-3 seconds hold - Wrist Flexion with Resistance  - 2 x daily - 4-5 x weekly - 1-2 sets - 10-15 reps - Wrist Extension with Resistance  - 2-3 x daily - 1-2 sets - 10-15 reps - Standing Elbow Extension with Self-Anchored Resistance  - 2-3 x daily - 1-2 sets - 10-15 reps    PATIENT EDUCATION: Education details: See tx section above for details  Person educated: Patient Education method: Engineer, structural, Teach back, Handouts  Education comprehension: States and demonstrates understanding, Additional Education required    HOME EXERCISE PROGRAM: Access Code: [email or web address] URL: [email or web address] Date: 05/06/2024 Prepared by: Leartis Proud   GOALS: Goals reviewed with patient? Yes   SHORT TERM GOALS: (STG required if POC>30 days) Target Date: 05/22/2024  Pt will obtain protective, custom orthotic. Goal status: TBD/PRN  2.  Pt will demo/state understanding of initial HEP to improve pain levels and prerequisite motion. Goal status: 05/13/24: MET   LONG TERM GOALS: Target Date: 06/19/24  Pt will improve functional ability by decreased impairment per PSFS assessment from 1.3 to 5 or better, for better quality of life. Goal status: INITIAL  2.  Pt will improve grip strength  in right dominant hand hand from unsafe to test to at least 30 lbs for functional use at home and in IADLs. Goal status: INITIAL  3.  Pt will improve A/ROM in right wrist flexion/extension from 49/44 respectively to at least 60 degrees each, to have functional motion for tasks like reach and grasp.  Goal status: INITIAL  4.  Pt will improve A/ROM in right small finger total active motion from 163 degrees to at least 200 degrees, to have functional motion for tasks like reach and grasp.  Goal status: 05/13/24: MET 222*   5.  Pt will improve strength in right wrist flexion/extension from apparent 3 -/5 MMT to at least 4+/5 MMT to have increased functional  ability to carry out selfcare and higher-level homecare tasks with less difficulty. Goal status: INITIAL  5.  Pt will improve coordination skills in right dominant hand, as seen by within functional limit score on nine-hole peg testing to have increased functional ability to carry out fine motor tasks (fasteners, etc.) and more complex, coordinated IADLs (meal prep, sports, etc.).  Goal status: INITIAL  6.  Pt will decrease pain at worst from 5-6/10 to 2/10 or better to have better sleep and occupational participation in daily roles. Goal status: INITIAL    ASSESSMENT:  CLINICAL IMPRESSION: 06/03/24: His sensation continues to improve in the small finger likely helped by wearing a sleeve in the night on his elbow.  He is also now working on shoulder stretches which may benefit him as well.  05/27/24: He is doing very good and is very strong and is having less pain, but still some nerve issues of numbness and occasional burning pain.  OT will look at his shoulder in the next session and see if there is anything glaring there that could be affecting this.  05/20/24: Unfortunately his ulnar nerve is being impinged in the night at the elbow.  We discussed this and hopefully it helps him feel better.  If not, this could be a bigger problem related to the shoulder or shoulder surgery or perhaps scar tissue from the recent Guyon's release.  Otherwise he is doing fantastic and making great improvements in all other regards      PLAN:  OT FREQUENCY: 1-2x/week  OT DURATION: 6 weeks through 06/19/2024 and up to 10 total visits as needed  PLANNED INTERVENTIONS: 97168 OT Re-evaluation, 97535 self care/ADL training, 40981 therapeutic exercise, 97530 therapeutic activity, 97112 neuromuscular re-education, 97140 manual therapy, 97035 ultrasound, 97760 Orthotic Initial, 97763 Orthotic/Prosthetic subsequent, scar mobilization, compression bandaging, energy conservation, coping strategies training, and  patient/family education  RECOMMENDED OTHER SERVICES: None now  CONSULTED AND AGREED WITH PLAN OF CARE: Patient  PLAN FOR NEXT SESSION:   He may be ready for discharge in the next 1-2 visits if all goes well, his grip strength and sensation continue to improve.  Check new shoulder stretches and isometric gripping and pinching   Lashawna Poche, OTR/L, CHT 06/03/2024, 12:59 PM

## 2024-06-03 ENCOUNTER — Encounter: Payer: Self-pay | Admitting: Rehabilitative and Restorative Service Providers"

## 2024-06-03 ENCOUNTER — Ambulatory Visit: Admitting: Rehabilitative and Restorative Service Providers"

## 2024-06-03 DIAGNOSIS — R278 Other lack of coordination: Secondary | ICD-10-CM

## 2024-06-03 DIAGNOSIS — R202 Paresthesia of skin: Secondary | ICD-10-CM | POA: Diagnosis not present

## 2024-06-03 DIAGNOSIS — M25641 Stiffness of right hand, not elsewhere classified: Secondary | ICD-10-CM | POA: Diagnosis not present

## 2024-06-03 DIAGNOSIS — M6281 Muscle weakness (generalized): Secondary | ICD-10-CM

## 2024-06-03 DIAGNOSIS — M79641 Pain in right hand: Secondary | ICD-10-CM

## 2024-06-03 DIAGNOSIS — R600 Localized edema: Secondary | ICD-10-CM

## 2024-06-08 NOTE — Therapy (Signed)
 OUTPATIENT OCCUPATIONAL THERAPY TREATMENT NOTE  Patient Name: Brad Woods MRN: [id number] DOB:05/24/1946, 78 y.o., male Today's Date: 06/08/2024  PCP: Sally Crazier MD REFERRING PROVIDER:   Merrill Abide, MD    END OF SESSION:       Past Medical History:  Diagnosis Date   Allergy    Arthritis    Asthma    when younger   COPD (chronic obstructive pulmonary disease) (HCC)    Coronary artery disease    s/p stenting 10/2016   Dyspnea    History of kidney stones    15x   Hyperlipidemia    Hypertension    Myocardial infarction [facility])    Neuromuscular disorder (HCC)    neuropathy right hand   Pneumonia    As a child   Sleep apnea    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   Past Surgical History:  Procedure Laterality Date   BICEPT TENODESIS Right 10/22/2023   Procedure: BICEPS TENODESIS;  Surgeon: Jasmine Mesi, MD;  Location: [facility] OR;  Service: Orthopedics;  Laterality: Right;   CARDIAC CATHETERIZATION N/A 05/20/2016   Procedure: Left Heart Cath and Coronary Angiography;  Surgeon: Avanell Leigh, MD;  Location: [facility] INVASIVE CV LAB;  Service: Cardiovascular;  Laterality: N/A;   CARDIAC CATHETERIZATION N/A 11/22/2016   Procedure: Left Heart Cath and Coronary Angiography;  Surgeon: Sammy Crisp, MD;  Location: [facility] INVASIVE CV LAB;  Service: Cardiovascular;  Laterality: N/A;   CARPAL TUNNEL RELEASE Right    CHOLECYSTECTOMY N/A 03/10/2024   Procedure: LAPAROSCOPIC CHOLECYSTECTOMY;  Surgeon: Kinsinger, Alphonso Aschoff, MD;  Location: [facility] OR;  Service: General;  Laterality: N/A;   COLONOSCOPY     EYE SURGERY Left    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, 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   INGUINAL HERNIA REPAIR  1955   LEFT HEART CATH AND CORONARY ANGIOGRAPHY N/A 05/31/2020   Procedure: LEFT HEART CATH AND CORONARY ANGIOGRAPHY;  Surgeon: Millicent Ally, MD;  Location: [facility] INVASIVE CV LAB;  Service: Cardiovascular;  Laterality: N/A;   RECTAL SURGERY     polyps  and fissures removed   REPLACEMENT TOTAL KNEE Bilateral    REVERSE SHOULDER ARTHROPLASTY Right 10/22/2023   Procedure: RIGHT REVERSE SHOULDER ARTHROPLASTY;  Surgeon: Jasmine Mesi, MD;  Location: [facility] OR;  Service: Orthopedics;  Laterality: Right;   TONSILLECTOMY     TRIGGER FINGER RELEASE Right 04/24/2024   Procedure: RIGHT SMALL FINGER RELEASE TRIGGER FINGER/A-1 PULLEY;  Surgeon: Merrill Abide, MD;  Location: [facility];  Service: Orthopedics;  Laterality: Right;   Patient Active Problem List   Diagnosis Date Noted   Ulnar tunnel syndrome of right wrist 04/24/2024   Trigger finger, right little finger 04/24/2024   OA (osteoarthritis) of shoulder 10/22/2023   S/P reverse total shoulder arthroplasty, right 10/22/2023   Arthritis of right shoulder region 10/22/2023   Biceps tendonitis, right 10/22/2023   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   Morbid obesity (HCC) 07/27/2020   Dizziness 06/22/2020   Exertional dyspnea    Chronic pain of left knee 09/02/2018   Chronic pain of right knee 09/02/2018   History of bilateral knee replacement 09/02/2018   Allergic rhinitis 08/15/2018   Erectile dysfunction 08/15/2018   History of renal stone 08/15/2018   Migraine 08/15/2018   Osteoarthritis 08/15/2018   Hyperlipidemia 08/15/2018   PVD (peripheral vascular disease) (HCC) 08/15/2018   Left shoulder pain 07/30/2018   Chest pain 07/30/2018   Trochanteric bursitis, left hip 05/02/2017   Carpal tunnel syndrome,  left upper limb 05/02/2017   Trochanteric bursitis, right hip 03/18/2017   Mixed hyperlipidemia 12/31/2016   Coronary artery disease involving native coronary artery of native heart with angina pectoris (HCC) 11/25/2016   Coronary artery disease involving native coronary artery of native heart without angina pectoris 11/20/2016   Elevated troponin    Unstable angina (HCC) 05/18/2016   Acute coronary syndrome (HCC) 05/18/2016   Benign non-nodular prostatic hyperplasia  with lower urinary tract symptoms 10/12/2014   Essential hypertension 10/12/2014   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  Rationale for Evaluation and Treatment: Rehabilitation  PERTINENT HISTORY: Past shoulder surgery and ulnar nerve compression as well as triggering small finger. He states he had a shoulder surgery and afterward his hand felt numb in the ulnar nerve distribution.  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  He also had a chronic triggering small finger so he had a release of this as well.  He now describes swelling and pain and stiffness and hypersensitivity as a burning pain through his small finger and ulnar side of ring finger.  He does appear to be guarded from pain and holding his hand nervously outstretched today.   PRECAUTIONS: None  RED FLAGS: None   WEIGHT BEARING RESTRICTIONS: Yes nonweightbearing in right hand and arm now   SUBJECTIVE:   SUBJECTIVE STATEMENT: Now ~6 weeks s/p Rt Guyon's canal release, aneurysm repair, and small finger trigger finger release.  He states ***   continued pain with opening his hand through hypothenar eminence.     PAIN:  Are you having pain? Yes: NPRS scale:  ***  2-3/10 at rest, up to 4/10 at worst at night  Pain location: Right small finger and ring finger and surgical site Pain description: Burning and nerve pain Aggravating factors: Holding hand nervously outstretched Relieving factors: Rest  PATIENT GOALS: To improve pain in an, and use of right hand and arm.  NEXT MD VISIT: 05/21/2024   OBJECTIVE: (All objective  assessments below are from initial evaluation on: 05/06/2024 unless otherwise specified.)   HAND DOMINANCE: Right   ADLs: Overall ADLs: States decreased ability to grab, hold household objects, pain and difficulty to open containers, perform FMS tasks (manipulate fasteners on clothing), mild to moderate bathing problems as well.    FUNCTIONAL OUTCOME MEASURES: Eval: Patient Specific Functional Scale: 1.3 (weed wacker, open a jar, use utensils)  (Higher Score  =  Better Ability for the Selected Tasks)      UPPER EXTREMITY ROM     Shoulder to Wrist AROM Right eval Rt 05/13/24 Rt 05/20/24 Rt 05/27/24 Rt 06/03/24  Forearm supination   75    Forearm pronation    80    Wrist flexion 49 53 60 60   Wrist extension 44 58 65 64   Sh Flex     109  Sh Abd     108  Sh ext     55  Sh ER     45  Sh IR     40  (Blank rows = not tested)   Hand AROM Right eval Rt 05/13/24 Rt 05/20/24  Rt 05/27/24  Full Fist Ability (or Gap to Distal Palmar Crease) 4.5cm gap from tip of SF to Alfalfa  Full fist  Full fist  Thumb Opposition  (Kapandji Scale)  6/10  8/10 8/10  Little MCP (0-90) 0-  44 0- 78 0 - 70 0 - 78  Little PIP (0-100) (-10) -  71 (-6) - 84 0 - 87 0 - 90  Little DIP (0-70) 0-  58 0- 66 0 - 78 0 - 78  (Blank rows = not tested)   UPPER EXTREMITY MMT:     MMT Right 05/27/24  Shoulder flexion   Shoulder abduction   Shoulder adduction   Shoulder extension   Shoulder internal rotation   Shoulder external rotation   Middle trapezius   Lower trapezius   Elbow flexion   Elbow extension   Forearm supination   Forearm pronation   Wrist flexion 4+/5  Wrist extension 5/5  Wrist ulnar deviation   Wrist radial deviation   (Blank rows = not tested)  HAND FUNCTION: 05/27/24: grip Rt: 96#  05/13/24: Grip strength Right: 82 lbs, Left: 65 lbs   COORDINATION: 06/10/24: 9HPT: Rt ***sec   05/13/24: 9HPT: 30sec    Eval: Observed coordination impairments with affected right hand. 9 Hole Peg Test  Right: 36sec (25 sec is WFL)   SENSATION: 06/10/24: S2PD Rt SF ***mm, RF ***mm   06/03/24: Rt SF: 2.83; RF: 2.83;   SF was 13-32mm gap on S2PD testing today   05/27/24: Ulnar nerve measures 3.61 today in SF, RF   05/20/24:  SF measures worse today 4.31, but ulnar side of RF measures better at 2.83 (normal)... interesting   Eval:  Light touch intact today, though diminished around sx area in an ulnar nerve distribution distally. IF, thumb, radial RF feel 2.83 (normal), but SF and ulnar RF can only feel 3.61 (diminished light touch)   EDEMA:   Eval:  Mildly swollen in right hand and wrist today  OBSERVATIONS:   Eval: Surgical site covered with Steri-Strip and trigger finger release well-healing.  No signs of infection etc.  Typical amount of tenderness and also has some hypersensitivity through the ulnar nerve distribution distal to the surgical site now.   TODAY'S TREATMENT:  06/10/24: *** He may be ready for discharge in the next 1-2 visits if all goes well, his grip strength and sensation continue to improve.  Check new shoulder stretches and isometric gripping and pinching   06/03/24: Today OT upgrades his HEP to include isometric gripping and pinching, and also educates on new shoulder mobility stretches as this could also be impinging on his nerve somewhat.  He performs active range of motion for exercise and new measures for the shoulder to help guide this process.  Each of these bolded items below were reviewed with him, performed with him to ensure they were not painful and that he was successful.  He was asked to add these into his program several times a day and most of the week to continue to recover from surgery and help improve his nerve.  Nervous sensation checked today also shows encouraging improvements.     Exercises - Ulnar Nerve Flossing  - 4-6 x daily - 5-10 reps - Wrist Prayer Stretch  - 4 x daily - 3-5 reps - 15 sec hold - PUSH KNUCKLES DOWN  - 4 x daily - 3-5 reps - 15  seconds hold - Tendon Glides  - 4-6 x daily - 3-5 reps - 2-3  seconds hold - Wrist Flexion with Resistance  - 2 x daily - 4-5 x weekly - 1-2 sets - 10-15 reps - Wrist Extension with Resistance  - 2-3 x daily - 1-2 sets - 10-15 reps - Standing Elbow Extension with Self-Anchored Resistance  - 2-3 x daily - 1-2 sets - 10-15 reps - Full Fist  - 2 x daily - 7 x weekly - 5 reps - 10 sec hold - Thumb Opposition with Putty  - 2 x daily - 7 x weekly - 5 reps - 10sec hold - Wall Climb  - 1-2 x daily - 3 reps - 15 sec hold - Doorway Stretches  - 1-2 x daily - 3 reps - 15 sec hold - Sleeper Stretch  - 1-2 x daily - 3 reps - 15 sec hold   PATIENT EDUCATION: Education details: See tx section above for details  Person educated: Patient Education method: Engineer, structural, Teach back, Handouts  Education comprehension: States and demonstrates understanding, Additional Education required    HOME EXERCISE PROGRAM: Access Code: [email or web address] URL: [email or web address] Date: 05/06/2024 Prepared by: Leartis Proud   GOALS: Goals reviewed with patient? Yes   SHORT TERM GOALS: (STG required if POC>30 days) Target Date: 05/22/2024  Pt will obtain protective, custom orthotic. Goal status: TBD/PRN  2.  Pt will demo/state understanding of initial HEP to improve pain levels and prerequisite motion. Goal status: 05/13/24: MET   LONG TERM GOALS: Target Date: 06/19/24  Pt will improve functional ability by decreased impairment per PSFS assessment from 1.3 to 5 or better, for better quality of life. Goal status: INITIAL  2.  Pt will improve grip strength in right dominant hand hand from unsafe to test to at least 30 lbs for functional use at home and in IADLs. Goal status: INITIAL  3.  Pt will improve A/ROM in right wrist flexion/extension from 49/44 respectively to at least 60 degrees each, to have functional motion for tasks like reach and grasp.  Goal status: INITIAL  4.  Pt will  improve A/ROM in right small finger total active motion from 163 degrees to at least 200 degrees, to have functional motion for tasks like reach and grasp.  Goal status: 05/13/24: MET 222*   5.  Pt will improve strength in right wrist flexion/extension from apparent 3 -/5 MMT to at least 4+/5 MMT to have increased functional ability to carry out selfcare and higher-level homecare tasks with less difficulty. Goal status: INITIAL  5.  Pt will improve coordination skills in right dominant hand, as seen by within functional limit score on nine-hole peg testing to have increased functional ability to carry out fine motor tasks (fasteners, etc.) and more complex, coordinated IADLs (meal prep, sports, etc.).  Goal status: INITIAL  6.  Pt will decrease pain at worst from 5-6/10 to 2/10 or better to have better sleep and occupational participation in daily roles. Goal status: INITIAL    ASSESSMENT:  CLINICAL IMPRESSION: 06/10/24: ***   06/03/24: His sensation continues to improve in the small finger likely helped by wearing a sleeve in the night on his elbow.  He is also now working on shoulder stretches which may benefit him as well.  05/27/24: He is doing very good and is very strong and is having less pain, but still some nerve issues of numbness and occasional burning pain.  OT will look at his shoulder in the next session and see if there is anything glaring there that could be affecting this.  05/20/24: Unfortunately his ulnar nerve is being impinged in the night at the elbow.  We discussed this and hopefully it helps him feel better.  If not, this could be a bigger problem related to the shoulder or shoulder surgery or perhaps scar tissue from the recent Guyon's release.  Otherwise he is doing fantastic and making great improvements in all other regards      PLAN:  OT FREQUENCY: 1-2x/week  OT DURATION: 6 weeks through 06/19/2024 and up to 10 total visits as needed  PLANNED INTERVENTIONS:  97168 OT Re-evaluation, 97535 self care/ADL training, 16109 therapeutic exercise, 97530 therapeutic activity, 97112 neuromuscular re-education, 97140 manual therapy, 97035 ultrasound, 97760 Orthotic Initial, 97763 Orthotic/Prosthetic subsequent, scar mobilization, compression bandaging, energy conservation, coping strategies training, and patient/family education  RECOMMENDED OTHER SERVICES: None now  CONSULTED AND AGREED WITH PLAN OF CARE: Patient  PLAN FOR NEXT SESSION:   ***  Leartis Proud, OTR/L, CHT 06/08/2024, 8:21 AM

## 2024-06-10 ENCOUNTER — Encounter: Payer: Self-pay | Admitting: Rehabilitative and Restorative Service Providers"

## 2024-06-10 ENCOUNTER — Ambulatory Visit: Admitting: Rehabilitative and Restorative Service Providers"

## 2024-06-10 DIAGNOSIS — R600 Localized edema: Secondary | ICD-10-CM

## 2024-06-10 DIAGNOSIS — R202 Paresthesia of skin: Secondary | ICD-10-CM

## 2024-06-10 DIAGNOSIS — M79641 Pain in right hand: Secondary | ICD-10-CM | POA: Diagnosis not present

## 2024-06-10 DIAGNOSIS — R278 Other lack of coordination: Secondary | ICD-10-CM

## 2024-06-10 DIAGNOSIS — M25641 Stiffness of right hand, not elsewhere classified: Secondary | ICD-10-CM

## 2024-06-10 DIAGNOSIS — M6281 Muscle weakness (generalized): Secondary | ICD-10-CM

## 2024-06-17 ENCOUNTER — Encounter: Admitting: Rehabilitative and Restorative Service Providers"

## 2024-06-22 DIAGNOSIS — Z6838 Body mass index (BMI) 38.0-38.9, adult: Secondary | ICD-10-CM | POA: Diagnosis not present

## 2024-06-22 DIAGNOSIS — R233 Spontaneous ecchymoses: Secondary | ICD-10-CM | POA: Diagnosis not present

## 2024-06-23 ENCOUNTER — Telehealth: Payer: Self-pay | Admitting: Orthopedic Surgery

## 2024-06-23 NOTE — Telephone Encounter (Signed)
 Patient called and wanted to know if you could prescribe him some Gabapentin . CB#[phone] [facility] [address] [facility]

## 2024-06-23 NOTE — Telephone Encounter (Signed)
 Per Dr erwin needs to get from pcp

## 2024-06-23 NOTE — Telephone Encounter (Signed)
 Called and advised pt.

## 2024-07-01 ENCOUNTER — Ambulatory Visit (INDEPENDENT_AMBULATORY_CARE_PROVIDER_SITE_OTHER): Admitting: Orthopedic Surgery

## 2024-07-01 DIAGNOSIS — G5621 Lesion of ulnar nerve, right upper limb: Secondary | ICD-10-CM

## 2024-07-01 DIAGNOSIS — Z9889 Other specified postprocedural states: Secondary | ICD-10-CM

## 2024-07-01 NOTE — Progress Notes (Signed)
   Norleen LABOR [place] - 78 y.o. male MRN [id number]  Date of birth: 01/17/1946  Office Visit Note: Visit Date: 07/01/2024 PCP: Nanci Senior, MD Referred by: Nanci Senior, MD  Subjective:  HPI: Brad Woods is a 78 y.o. 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  He continues to have persistent numbness within the small finger distally, ring finger ulnar aspect continues to have improved sensation.  Range of motion of the small finger is appropriate, no residual clicking or locking.  Has been discharged from OT.  Pertinent ROS were reviewed with the patient and found to be negative unless otherwise specified above in HPI.   Assessment & Plan: Visit Diagnoses:  1. Ulnar tunnel syndrome of right wrist   2. S/P trigger finger release      Plan: He is doing well overall from a range of motion standpoint.  Continues to have some persistent numbness at the distal aspect of the small finger, there is improvement in the ring finger from a sensation standpoint, he is appropriate 2 point discrimination in this distribution.  The small finger remains quite numb to both light touch and 2-point discrimination, however I did also explain that we worked on the small finger trigger digit in this region as well meaning that the digital nerves did undergo a process of retraction at this level as well.  I will plan on seeing him back in approximate 6 weeks to track his progress.  Follow-up: No follow-ups on file.   Meds & Orders: No orders of the defined types were placed in this encounter.  No orders of the defined types were placed in this encounter.    Procedures: No procedures performed       Objective:   Vital Signs: There were no vitals taken for this visit.  Ortho Exam Right wrist and hand: - Well-healed incisional site over the ulnar  aspect of the wrist, no erythema or drainage - Wrist range of motion flexion/extension 65/55, improved passively - Composite fist without significant restriction, small finger without residual clicking or locking, well-healed incision at the basilar aspect of the small finger - 2 point discrimination in the small finger is indiscernible, ulnar aspect of the ring finger 2-point discrimination is between 5 and 6 mm - Hand remains warm well-perfused  Imaging: No results found.   Sherline Eberwein Afton Alderton, M.D. [facility], Hand Surgery

## 2024-07-31 ENCOUNTER — Other Ambulatory Visit: Payer: Self-pay

## 2024-07-31 DIAGNOSIS — R0609 Other forms of dyspnea: Secondary | ICD-10-CM

## 2024-08-04 ENCOUNTER — Ambulatory Visit: Admitting: Pulmonary Disease

## 2024-08-04 DIAGNOSIS — R0609 Other forms of dyspnea: Secondary | ICD-10-CM | POA: Diagnosis not present

## 2024-08-04 LAB — PULMONARY FUNCTION TEST
DL/VA % pred: 102 %
DL/VA: 4 ml/min/mmHg/L
DLCO unc % pred: 78 %
DLCO unc: 21.45 ml/min/mmHg
FEF 25-75 Post: 2.61 L/s
FEF 25-75 Pre: 2.7 L/s
FEF2575-%Change-Post: -3 %
FEF2575-%Pred-Post: 108 %
FEF2575-%Pred-Pre: 111 %
FEV1-%Change-Post: -1 %
FEV1-%Pred-Post: 87 %
FEV1-%Pred-Pre: 89 %
FEV1-Post: 2.96 L
FEV1-Pre: 3.02 L
FEV1FVC-%Change-Post: 0 %
FEV1FVC-%Pred-Pre: 108 %
FEV6-%Change-Post: -2 %
FEV6-%Pred-Post: 84 %
FEV6-%Pred-Pre: 86 %
FEV6-Post: 3.71 L
FEV6-Pre: 3.82 L
FEV6FVC-%Change-Post: 0 %
FEV6FVC-%Pred-Post: 105 %
FEV6FVC-%Pred-Pre: 105 %
FVC-%Change-Post: -1 %
FVC-%Pred-Post: 81 %
FVC-%Pred-Pre: 82 %
FVC-Post: 3.8 L
FVC-Pre: 3.84 L
Post FEV1/FVC ratio: 78 %
Post FEV6/FVC ratio: 99 %
Pre FEV1/FVC ratio: 78 %
Pre FEV6/FVC Ratio: 99 %
RV % pred: 84 %
RV: 2.32 L
TLC % pred: 82 %
TLC: 6.33 L

## 2024-08-04 NOTE — Progress Notes (Signed)
 Full pft performed today

## 2024-08-04 NOTE — Patient Instructions (Signed)
 Full pft performed today

## 2024-08-17 ENCOUNTER — Other Ambulatory Visit: Payer: Self-pay | Admitting: Emergency Medicine

## 2024-08-18 DIAGNOSIS — L570 Actinic keratosis: Secondary | ICD-10-CM | POA: Diagnosis not present

## 2024-08-18 DIAGNOSIS — C44329 Squamous cell carcinoma of skin of other parts of face: Secondary | ICD-10-CM | POA: Diagnosis not present

## 2024-08-18 DIAGNOSIS — Z85828 Personal history of other malignant neoplasm of skin: Secondary | ICD-10-CM | POA: Diagnosis not present

## 2024-08-18 DIAGNOSIS — L821 Other seborrheic keratosis: Secondary | ICD-10-CM | POA: Diagnosis not present

## 2024-08-18 DIAGNOSIS — D692 Other nonthrombocytopenic purpura: Secondary | ICD-10-CM | POA: Diagnosis not present

## 2024-08-29 ENCOUNTER — Emergency Department (HOSPITAL_BASED_OUTPATIENT_CLINIC_OR_DEPARTMENT_OTHER): Admitting: Radiology

## 2024-08-29 ENCOUNTER — Other Ambulatory Visit: Payer: Self-pay

## 2024-08-29 ENCOUNTER — Emergency Department (HOSPITAL_BASED_OUTPATIENT_CLINIC_OR_DEPARTMENT_OTHER)
Admission: EM | Admit: 2024-08-29 | Discharge: 2024-08-29 | Disposition: A | Discharge status: Home / Self Care | Source: Ambulatory Visit | Attending: Emergency Medicine | Admitting: Emergency Medicine

## 2024-08-29 DIAGNOSIS — W01198A Fall on same level from slipping, tripping and stumbling with subsequent striking against other object, initial encounter: Secondary | ICD-10-CM | POA: Insufficient documentation

## 2024-08-29 DIAGNOSIS — J449 Chronic obstructive pulmonary disease, unspecified: Secondary | ICD-10-CM | POA: Insufficient documentation

## 2024-08-29 DIAGNOSIS — S20212A Contusion of left front wall of thorax, initial encounter: Secondary | ICD-10-CM | POA: Diagnosis not present

## 2024-08-29 DIAGNOSIS — Y92812 Truck as the place of occurrence of the external cause: Secondary | ICD-10-CM | POA: Diagnosis not present

## 2024-08-29 DIAGNOSIS — S2242XA Multiple fractures of ribs, left side, initial encounter for closed fracture: Secondary | ICD-10-CM | POA: Diagnosis not present

## 2024-08-29 DIAGNOSIS — S299XXA Unspecified injury of thorax, initial encounter: Secondary | ICD-10-CM | POA: Diagnosis present

## 2024-08-29 DIAGNOSIS — S2020XA Contusion of thorax, unspecified, initial encounter: Secondary | ICD-10-CM | POA: Diagnosis not present

## 2024-08-29 DIAGNOSIS — Z7982 Long term (current) use of aspirin: Secondary | ICD-10-CM | POA: Insufficient documentation

## 2024-08-29 DIAGNOSIS — Y9301 Activity, walking, marching and hiking: Secondary | ICD-10-CM | POA: Insufficient documentation

## 2024-08-29 DIAGNOSIS — R0781 Pleurodynia: Secondary | ICD-10-CM | POA: Diagnosis not present

## 2024-08-29 MED ORDER — LIDOCAINE 5 % EX PTCH
1.0000 | MEDICATED_PATCH | CUTANEOUS | Status: DC
  Administered 2024-08-29: 1 via TRANSDERMAL
  Filled 2024-08-29: qty 1

## 2024-08-29 MED ORDER — OXYCODONE-ACETAMINOPHEN 5-325 MG PO TABS
1.0000 | ORAL_TABLET | Freq: Once | ORAL | Status: AC
  Administered 2024-08-29: 1 via ORAL
  Filled 2024-08-29: qty 1

## 2024-08-29 MED ORDER — OXYCODONE HCL 5 MG PO TABS: 5.0000 mg | ORAL_TABLET | Freq: Four times a day (QID) | ORAL | 0 refills | Status: DC | PRN

## 2024-08-29 NOTE — ED Notes (Signed)
 Instructed pt on incentive spirometer. 2000 x 10 good effort.

## 2024-08-29 NOTE — ED Provider Notes (Signed)
 [place] EMERGENCY DEPARTMENT AT [facility] Provider Note   CSN: [id number] Arrival date & time: 08/29/24  1538     Patient presents with: Rib Injury   Brad Woods is a 78 y.o. male.   78 year old male with a history of CVA and COPD who presents to the emergency department with left-sided rib pain.  Patient reports that yesterday he was walking on a retaining wall when he fell and struck the left side of his ribs on his truck bed.  No head strike or LOC.  No other injuries as result of the fall.  With urgent care today because he was having some persistent pain and was diagnosed with rib fractures and referred to the emergency department.  The pain is 2/10 at rest.  If he coughs or breathes deeply at 6/10.       Prior to Admission medications   Medication Sig Start Date End Date Taking? Authorizing Provider  albuterol  (VENTOLIN  HFA) 108 (90 Base) MCG/ACT inhaler Inhale 2 puffs into the lungs every 6 (six) hours as needed for wheezing or shortness of breath. 10/03/23   Hunsucker, Donnice SAUNDERS, MD  amoxicillin -clavulanate (AUGMENTIN ) 875-125 MG tablet Take 1 tablet by mouth every 12 (twelve) hours. 03/15/24   Curatolo, Adam, DO  aspirin  EC 81 MG EC tablet Take 1 tablet (81 mg total) by mouth daily. 05/22/16   Claudene Pacific, MD  carvedilol  (COREG ) 12.5 MG tablet Take 1 tablet (12.5 mg total) by mouth 2 (two) times daily. 10/01/23   Rana Lum CROME, NP  clopidogrel  (PLAVIX ) 75 MG tablet TAKE 1 TABLET BY MOUTH EVERY DAY 08/18/24   Rana Lum L, NP  docusate sodium  (COLACE) 100 MG capsule Take 1 capsule (100 mg total) by mouth 2 (two) times daily. Patient not taking: Reported on 03/03/2024 10/23/23   Magnant, Carlin CROME, PA-C  Evolocumab  (REPATHA  SURECLICK) 140 MG/ML SOAJ Inject 140 mg into the skin every 14 (fourteen) days. 02/19/24   Nahser, Aleene PARAS, MD  finasteride  (PROSCAR ) 5 MG tablet Take 5 mg by mouth daily.    [provider]  fluticasone (FLONASE) 50  MCG/ACT nasal spray Place 2 sprays into the nose daily as needed for allergies.  12/07/13   [provider]  Fluticasone-Umeclidin-Vilant (TRELEGY ELLIPTA ) 200-62.5-25 MCG/ACT AEPB Inhale 1 puff into the lungs daily. Patient taking differently: Inhale 1 puff into the lungs daily as needed (shortness of breath). 10/03/23   Hunsucker, Donnice SAUNDERS, MD  gabapentin  (NEURONTIN ) 300 MG capsule Take 1 capsule (300 mg total) by mouth 3 (three) times daily. Patient not taking: Reported on 03/03/2024 12/16/23   Addie Cordella Hamilton, MD  isosorbide  mononitrate (IMDUR ) 60 MG 24 hr tablet Take 1 tablet (60 mg total) by mouth daily. 10/01/23 04/24/24  Rana Lum CROME, NP  Multiple Vitamins-Minerals (CENTRUM SILVER 50+MEN PO) Take 1 tablet by mouth daily.    [provider]  nitroGLYCERIN  (NITROSTAT ) 0.4 MG SL tablet Dissolve 1 tablet under the tongue every 5 minutes as needed for chest pain. Max of 3 doses, then 911. 07/09/23   Nahser, Aleene PARAS, MD  ondansetron  (ZOFRAN -ODT) 4 MG disintegrating tablet Take 1 tablet (4 mg total) by mouth every 8 (eight) hours as needed for nausea or vomiting. 02/15/24   Vicky Charleston, PA-C  oxyCODONE  (ROXICODONE ) 5 MG immediate release tablet Take 1 tablet (5 mg total) by mouth every 6 (six) hours as needed for severe pain (pain score 7-10). 08/29/24   Brad Charleston BROCKS, MD  tamsulosin  (FLOMAX ) 0.4  MG CAPS capsule Take 0.4 mg by mouth at bedtime.  08/14/16   [provider]    Allergies: Hydromorphone hcl, Hydromorphone, and Aleve [naproxen sodium]    Review of Systems  Updated Vital Signs BP (!) 159/72   Pulse (!) 56   Temp 98.1 F (36.7 C) (Oral)   Resp 14   SpO2 97%   Physical Exam Constitutional:      Appearance: Normal appearance.  HENT:     Head: Normocephalic and atraumatic.  Eyes:     Extraocular Movements: Extraocular movements intact.     Conjunctiva/sclera: Conjunctivae normal.     Pupils: Pupils are equal, round, and reactive to  light.  Cardiovascular:     Rate and Rhythm: Normal rate and regular rhythm.     Pulses: Normal pulses.     Heart sounds: Normal heart sounds.  Pulmonary:     Effort: Pulmonary effort is normal. No respiratory distress.     Breath sounds: Normal breath sounds.  Musculoskeletal:     Comments: Chest wall tenderness to palpation on the midclavicular line anteriorly underneath the left pectoral muscle.  No bruising  Neurological:     Mental Status: He is alert.     (all labs ordered are listed, but only abnormal results are displayed) Labs Reviewed - No data to display  EKG: None  Radiology: DG Ribs Unilateral W/Chest Left Addendum Date: 08/29/2024 ADDENDUM REPORT: 08/29/2024 16:38 ADDENDUM: It is an old fracture of the left fifth rib, not the eighth. Electronically Signed   By: Lynwood Landy Raddle M.D.   On: 08/29/2024 16:38   Result Date: 08/29/2024 CLINICAL DATA:  Left rib pain after fall. EXAM: LEFT RIBS AND CHEST - 3+ VIEW COMPARISON:  March 15, 2024.  February 14, 2024. FINDINGS: Old left eighth rib fracture is again noted. No acute fracture is noted. There is no evidence of pneumothorax or pleural effusion. Both lungs are clear. Heart size and mediastinal contours are within normal limits. IMPRESSION: Old left eighth rib fracture. No acute fracture is noted. Electronically Signed: By: Lynwood Landy Raddle M.D. On: 08/29/2024 16:16     Procedures   Medications Ordered in the ED  lidocaine  (LIDODERM ) 5 % 1 patch (1 patch Transdermal Patch Applied 08/29/24 1605)  oxyCODONE -acetaminophen  (PERCOCET/ROXICET) 5-325 MG per tablet 1 tablet (1 tablet Oral Given 08/29/24 1606)                                    Medical Decision Making Amount and/or Complexity of Data Reviewed Radiology: ordered.  Risk Prescription drug management.   Brad Woods is a 78 year old male with a history of CVA and COPD who presents to the emergency department with left-sided rib pain.    Initial Ddx:   Bruised rib, rib fracture, pneumonia, pneumothorax  MDM:  The patient presents to the emergency department with rib pain after trauma.  On exam does appear to be uncomfortable but is satting well on room air. Unfortunately I can't see the images from urgent care. Concerned about possible bruised rib or rib fractures will obtain x-rays at this time.  Plan:  X-ray Lidocaine  patch P.o. pain medication  ED Summary/Re-evaluation:  Patient had x-rays which showed that showed old rib fractures but none that were acute.  No pneumothorax or pneumonia.  Pain under control here in the emergency department.  Given an incentive spirometer and instructed to follow-up with your primary  doctor in several days.  This patient presents to the ED for concern of complaints listed in HPI, this involves an extensive number of treatment options, and is a complaint that carries with it a high risk of complications and morbidity. Disposition including potential need for admission considered.   Dispo: DC Home. Return precautions discussed including, but not limited to, those listed in the AVS. Allowed pt time to ask questions which were answered fully prior to dc.  Additional history obtained from family Records reviewed Outpatient Clinic Notes I independently reviewed the following imaging with scope of interpretation limited to determining acute life threatening conditions related to emergency care: Chest x-ray and agree with the radiologist interpretation with the following exceptions: none I have reviewed the patients home medications and made adjustments as needed Social Determinants of health:  Geriatric   Final diagnoses:  Contusion of rib on left side, initial encounter    ED Discharge Orders          Ordered    oxyCODONE  (ROXICODONE ) 5 MG immediate release tablet  Every 6 hours PRN       Note to Pharmacy: Diagnosis code G56.21   08/29/24 1652               Brad Lamar BROCKS,  MD 08/29/24 1654

## 2024-08-29 NOTE — Discharge Instructions (Signed)
 You were seen for your rib pain in the emergency department.  You likely have a bruised rib.  At home, please take Tylenol  and use lidocaine  patches we have prescribed you for your pain.  You may also take the oxycodone  we have prescribed you for any breakthrough pain that may have.  Do not take this before driving or operating heavy machinery.  Do not take this medication with alcohol.  Please also use the incentive spirometer we have given you to prevent any pneumonias.  Check your MyChart online for the results of any tests that had not resulted by the time you left the emergency department.   Follow-up with your primary doctor in 2-3 days regarding your visit.    Return immediately to the emergency department if you experience any of the following: Difficulty breathing, fever, severe pain, or any other concerning symptoms.    Thank you for visiting our Emergency Department. It was a pleasure taking care of you today.

## 2024-08-29 NOTE — ED Triage Notes (Signed)
 Patient states fall against truck injuring left side. Went to UC and was told he had broken ribs and needed to come to the ED.

## 2024-09-18 DIAGNOSIS — Z1331 Encounter for screening for depression: Secondary | ICD-10-CM | POA: Diagnosis not present

## 2024-09-18 DIAGNOSIS — R202 Paresthesia of skin: Secondary | ICD-10-CM | POA: Diagnosis not present

## 2024-09-18 DIAGNOSIS — I5032 Chronic diastolic (congestive) heart failure: Secondary | ICD-10-CM | POA: Diagnosis not present

## 2024-09-18 DIAGNOSIS — N400 Enlarged prostate without lower urinary tract symptoms: Secondary | ICD-10-CM | POA: Diagnosis not present

## 2024-09-18 DIAGNOSIS — I2510 Atherosclerotic heart disease of native coronary artery without angina pectoris: Secondary | ICD-10-CM | POA: Diagnosis not present

## 2024-09-18 DIAGNOSIS — M1990 Unspecified osteoarthritis, unspecified site: Secondary | ICD-10-CM | POA: Diagnosis not present

## 2024-09-18 DIAGNOSIS — E7800 Pure hypercholesterolemia, unspecified: Secondary | ICD-10-CM | POA: Diagnosis not present

## 2024-09-18 DIAGNOSIS — G4733 Obstructive sleep apnea (adult) (pediatric): Secondary | ICD-10-CM | POA: Diagnosis not present

## 2024-09-18 DIAGNOSIS — Z0000 Encounter for general adult medical examination without abnormal findings: Secondary | ICD-10-CM | POA: Diagnosis not present

## 2024-09-18 DIAGNOSIS — Z131 Encounter for screening for diabetes mellitus: Secondary | ICD-10-CM | POA: Diagnosis not present

## 2024-09-18 DIAGNOSIS — I10 Essential (primary) hypertension: Secondary | ICD-10-CM | POA: Diagnosis not present

## 2024-09-18 DIAGNOSIS — Z6839 Body mass index (BMI) 39.0-39.9, adult: Secondary | ICD-10-CM | POA: Diagnosis not present

## 2024-09-21 DIAGNOSIS — C44329 Squamous cell carcinoma of skin of other parts of face: Secondary | ICD-10-CM | POA: Diagnosis not present

## 2024-09-21 DIAGNOSIS — Z85828 Personal history of other malignant neoplasm of skin: Secondary | ICD-10-CM | POA: Diagnosis not present

## 2024-09-29 ENCOUNTER — Ambulatory Visit (INDEPENDENT_AMBULATORY_CARE_PROVIDER_SITE_OTHER): Admitting: Pulmonary Disease

## 2024-09-29 ENCOUNTER — Encounter: Payer: Self-pay | Admitting: Pulmonary Disease

## 2024-09-29 VITALS — BP 132/75 | HR 74 | Temp 97.8°F | Ht 74.0 in | Wt 292.2 lb

## 2024-09-29 DIAGNOSIS — G4731 Primary central sleep apnea: Secondary | ICD-10-CM | POA: Diagnosis not present

## 2024-09-29 DIAGNOSIS — J45909 Unspecified asthma, uncomplicated: Secondary | ICD-10-CM | POA: Diagnosis not present

## 2024-09-29 DIAGNOSIS — J4550 Severe persistent asthma, uncomplicated: Secondary | ICD-10-CM

## 2024-09-29 MED ORDER — TRELEGY ELLIPTA 200-62.5-25 MCG/ACT IN AEPB: 1.0000 | INHALATION_SPRAY | Freq: Every day | RESPIRATORY_TRACT | 11 refills | Status: DC

## 2024-09-29 NOTE — Patient Instructions (Signed)
 Nice to see you again  Lets try Trelegy consistently for the next 2 to 3 months.  New prescription today.  Also paperwork for manufacture assistance if it is too expensive again.  We may need to consider adding additional medicines for asthma, these are injectable medicines.  We can discuss this at next visit if needed.  I ordered a BiPAP titration for your sleep apnea.  Hopefully this will get scheduled soon.  This will be done in the lab or in the hospital.  Return to clinic in 3 months or sooner as needed with Dr. Annella

## 2024-09-29 NOTE — Progress Notes (Signed)
 @Patient  ID: Brad Woods, male    DOB: 03-Nov-1946, 78 y.o.   MRN: [id number]  No chief complaint on file.   Referring provider: Nanci Senior, MD  HPI:   78 y.o. man whom are seen for evaluation of dyspnea exertion.    Returns for overdue follow-up.  At last visit reviewed sleep study with mixed central and obstructive apnea.  Discussed with sleep doctor and got him appointment scheduled.  Also ordered BiPAP titration.  Unfortunately did not come to either of these.  He has had a lot of medical issues including multiple surgeries and ongoing follow-ups with less over months that precluded further treatment over evaluation.  After discussion he is okay with moving forward with BiPAP titration again today.  He has ongoing shortness of breath.  Some chest discomfort when he walks around.  Not reliably reproducible with exertion.  Trelegy seem to help in the past.  Has not used in some months due to expense.  HPI at initial visit: He reports issues with dyspnea exertion due to back to childhood.  Noticing a double ammonia at age 34.  In high school he was unable to read try due to dyspnea.  States he could run quickly but only for short distances but then would become became dyspneic chest tightness etc.  Has had dyspnea issues really all throughout his life.  More noticeable over the last 10 years or so he thinks.  No times a day when things are better or worse.  No position to make sense better or worse.  No seasonal environmental factors he can evaluate things better or worse.  He thinks albuterol  when used as needed helps a little bit but for short period of time.  He has been on various ICS/LABA inhalers in the past but does not think it really helped much.  He reports need to get asbestosis exposure with earlier career.  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  He has history of coronary disease.  Most recent echocardiogram  07/2020 with AI, left heart cath 05/2020 with elevated LVEDP at 17 and several noted lesions in patent proximal circumflex to mid circumflex stent noted.  His most recent chest x-ray that I can see results are from 2016 and in the [facility] system, clear lungs without significant pleural disease.  Questionaires / Pulmonary Flowsheets:   ACT:      No data to display          MMRC:     No data to display          Epworth:      No data to display          Tests:   FENO:  No results found for: NITRICOXIDE  PFT:    Latest Ref Rng & Units 08/04/2024   10:33 AM  PFT Results  FVC-Pre L 3.84   FVC-Predicted Pre % 82   FVC-Post L 3.80   FVC-Predicted Post % 81   Pre FEV1/FVC % % 78   Post FEV1/FCV % % 78   FEV1-Pre L 3.02   FEV1-Predicted Pre % 89   FEV1-Post L 2.96   DLCO uncorrected ml/min/mmHg 21.45   DLCO UNC% % 78   DLVA Predicted % 102   TLC L 6.33   TLC % Predicted % 82   RV % Predicted % 84     WALK:      No data to display  Imaging: Personally reviewed and as per EMR and discussion in this note No results found.   Lab Results: Personally reviewed CBC    Component Value Date/Time   WBC 5.6 03/15/2024 1121   RBC 5.10 03/15/2024 1121   HGB 15.2 03/15/2024 1121   HGB 15.5 05/26/2020 1046   HCT 43.3 03/15/2024 1121   HCT 45.5 05/26/2020 1046   PLT 166 03/15/2024 1121   PLT 164 05/26/2020 1046   MCV 84.9 03/15/2024 1121   MCV 88 05/26/2020 1046   MCH 29.8 03/15/2024 1121   MCHC 35.1 03/15/2024 1121   RDW 14.0 03/15/2024 1121   RDW 13.7 05/26/2020 1046   LYMPHSABS 1.0 03/15/2024 1121   MONOABS 0.5 03/15/2024 1121   EOSABS 0.2 03/15/2024 1121   BASOSABS 0.0 03/15/2024 1121    BMET    Component Value Date/Time   NA 136 03/15/2024 1121   NA 142 07/09/2023 1006   K 3.5 03/15/2024 1121   CL 103 03/15/2024 1121   CO2 23 03/15/2024 1121   GLUCOSE 114 (H) 03/15/2024 1121   BUN 15 03/15/2024 1121   BUN 17 07/09/2023 1006    CREATININE 0.84 03/15/2024 1121   CREATININE 0.96 11/20/2016 1643   CALCIUM  8.9 03/15/2024 1121   GFRNONAA >60 03/15/2024 1121   GFRAA 85 07/27/2020 0935    BNP No results found for: BNP  ProBNP No results found for: PROBNP  Specialty Problems       Pulmonary Problems   OSA (obstructive sleep apnea)   Overview:  Severe.  On bipap.  Sees Dr. Javaid.      Allergic rhinitis   Exertional dyspnea    Allergies  Allergen Reactions   Hydromorphone Hcl Other (See Comments)    flatlined - Patient states he was overdosed. He states he isn't allergic.   Hydromorphone     Cardiac arrest   Aleve [Naproxen Sodium] Dermatitis    States he was given aleve in the hospital and it caused welts.     Immunization History  Administered Date(s) Administered   DT (Pediatric) 03/01/2008   INFLUENZA, HIGH DOSE SEASONAL PF 09/14/2010, 11/01/2015   Influenza, Seasonal, Injecte, Preservative Fre 10/12/2014   Influenza,trivalent, recombinat, inj, PF 09/18/2013   Influenza-Unspecified 09/24/2012   Pfizer(Comirnaty)Fall Seasonal Vaccine 12 years and older 10/02/2023   Pneumococcal Conjugate-13 11/23/2014   Pneumococcal Polysaccharide-23 03/03/2012   Zoster, Live 09/18/2013    Past Medical History:  Diagnosis Date   Allergy    Arthritis    Asthma    when younger   COPD (chronic obstructive pulmonary disease) (HCC)    Coronary artery disease    s/p stenting 10/2016   Dyspnea    History of kidney stones    15x   Hyperlipidemia    Hypertension    Myocardial infarction [facility])    Neuromuscular disorder (HCC)    neuropathy right hand   Pneumonia    As a child   Sleep apnea    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    Tobacco History: Social History   Tobacco Use  Smoking Status Never  Smokeless Tobacco Never   Counseling given: Not Answered   Continue to not smoke  Outpatient Encounter Medications as of 09/29/2024  Medication Sig   albuterol  (VENTOLIN  HFA) 108  (90 Base) MCG/ACT inhaler Inhale 2 puffs into the lungs every 6 (six) hours as needed for wheezing or shortness of breath.   aspirin  EC 81 MG EC tablet Take 1 tablet (81 mg total) by  mouth daily.   carvedilol  (COREG ) 12.5 MG tablet Take 1 tablet (12.5 mg total) by mouth 2 (two) times daily.   clopidogrel  (PLAVIX ) 75 MG tablet TAKE 1 TABLET BY MOUTH EVERY DAY   Evolocumab  (REPATHA  SURECLICK) 140 MG/ML SOAJ Inject 140 mg into the skin every 14 (fourteen) days.   finasteride  (PROSCAR ) 5 MG tablet Take 5 mg by mouth daily.   fluticasone (FLONASE) 50 MCG/ACT nasal spray Place 2 sprays into the nose daily as needed for allergies.    isosorbide  mononitrate (IMDUR ) 60 MG 24 hr tablet Take 1 tablet (60 mg total) by mouth daily.   Multiple Vitamins-Minerals (CENTRUM SILVER 50+MEN PO) Take 1 tablet by mouth daily.   nitroGLYCERIN  (NITROSTAT ) 0.4 MG SL tablet Dissolve 1 tablet under the tongue every 5 minutes as needed for chest pain. Max of 3 doses, then 911.   tamsulosin  (FLOMAX ) 0.4 MG CAPS capsule Take 0.4 mg by mouth at bedtime.    [DISCONTINUED] Fluticasone-Umeclidin-Vilant (TRELEGY ELLIPTA ) 200-62.5-25 MCG/ACT AEPB Inhale 1 puff into the lungs daily. (Patient taking differently: Inhale 1 puff into the lungs daily as needed (shortness of breath).)   docusate sodium  (COLACE) 100 MG capsule Take 1 capsule (100 mg total) by mouth 2 (two) times daily. (Patient not taking: Reported on 09/29/2024)   Fluticasone-Umeclidin-Vilant (TRELEGY ELLIPTA ) 200-62.5-25 MCG/ACT AEPB Inhale 1 puff into the lungs daily.   [DISCONTINUED] amoxicillin -clavulanate (AUGMENTIN ) 875-125 MG tablet Take 1 tablet by mouth every 12 (twelve) hours.   [DISCONTINUED] gabapentin  (NEURONTIN ) 300 MG capsule Take 1 capsule (300 mg total) by mouth 3 (three) times daily. (Patient not taking: Reported on 03/03/2024)   [DISCONTINUED] ondansetron  (ZOFRAN -ODT) 4 MG disintegrating tablet Take 1 tablet (4 mg total) by mouth every 8 (eight) hours as  needed for nausea or vomiting.   [DISCONTINUED] oxyCODONE  (ROXICODONE ) 5 MG immediate release tablet Take 1 tablet (5 mg total) by mouth every 6 (six) hours as needed for severe pain (pain score 7-10).   No facility-administered encounter medications on file as of 09/29/2024.     Review of Systems  Review of Systems  N/a Physical Exam  BP 132/75   Pulse 74   Temp 97.8 F (36.6 C) (Oral)   Ht 6' 2 (1.88 m)   Wt 292 lb 3.2 oz (132.5 kg)   SpO2 95%   BMI 37.52 kg/m   Wt Readings from Last 5 Encounters:  09/29/24 292 lb 3.2 oz (132.5 kg)  04/24/24 282 lb 3 oz (128 kg)  03/15/24 270 lb (122.5 kg)  03/10/24 270 lb (122.5 kg)  03/04/24 282 lb 4.8 oz (128.1 kg)    BMI Readings from Last 5 Encounters:  09/29/24 37.52 kg/m  04/24/24 37.23 kg/m  03/15/24 35.62 kg/m  03/10/24 35.62 kg/m  03/04/24 37.24 kg/m     Physical Exam General: Sitting in chair, no Eyes: EOMI, no icterus Neck: Supple, no JVP Cardiovascular: Regular and rhythm, no murmur Pulmonary: Clear, distant, normal work of breathing on room air Abdomen: Nondistended bowel sounds present MSK: No synovitis, no joint effusion Neuro: Normal gait, weakness Psych: Normal mood, full affect   Assessment & Plan:   Dyspnea on exertion: Chronic issue.  Even started in high school.  Present for several years as an adult now.  Prior cardiac workup indicated elevated LVEDP of 17 on left heart catheterization 05/2020.  He has CAD and has stenting.  TTE 07/2020 revealed aortic insufficiency.  Quite possible cardiac contributors.  In addition, given his shortness of breath described in high  school, high suspicion for asthma.  Has been diagnosed with this in the past and treated in the past.  Does not feel like inhalers help much.  Significant asbestosis exposure possible development of restrictive lung disease.  Chest x-ray 09/2023 clear.  PFTs 2025 normal.  Suspect asthma, deconditioning largest contributors.  Asthma:  Diagnosed in the past.  ICS/LABA inhaler is not very helpful.  Trelegy has helped but not using reliably due to cost.  Will prescribe again today.  Manufacture and assistance paperwork just in case remains too expensive.  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  Discussed with sleep physician.  BiPAP titration with request for backup rate placed today.  Will arrange sleep follow-up in the future as needed.  Previous BiPAP titration and sleep doctor referral was canceled by patient with significant other medical parties including multiple surgeries over the last few months.  Return in about 3 months (around 12/30/2024) for f/u Dr. Annella.   Brad JONELLE Annella, MD 09/29/2024   I spent 41 minutes in the care of the patient reviewing records, face-to-face visit, coordination of care.

## 2024-10-01 ENCOUNTER — Ambulatory Visit: Admitting: Internal Medicine

## 2024-10-01 DIAGNOSIS — H5213 Myopia, bilateral: Secondary | ICD-10-CM | POA: Diagnosis not present

## 2024-10-01 DIAGNOSIS — H524 Presbyopia: Secondary | ICD-10-CM | POA: Diagnosis not present

## 2024-10-01 DIAGNOSIS — H50012 Monocular esotropia, left eye: Secondary | ICD-10-CM | POA: Diagnosis not present

## 2024-10-01 DIAGNOSIS — H52223 Regular astigmatism, bilateral: Secondary | ICD-10-CM | POA: Diagnosis not present

## 2024-10-01 DIAGNOSIS — H31091 Other chorioretinal scars, right eye: Secondary | ICD-10-CM | POA: Diagnosis not present

## 2024-10-01 DIAGNOSIS — H25813 Combined forms of age-related cataract, bilateral: Secondary | ICD-10-CM | POA: Diagnosis not present

## 2024-10-01 DIAGNOSIS — H43813 Vitreous degeneration, bilateral: Secondary | ICD-10-CM | POA: Diagnosis not present

## 2024-10-05 ENCOUNTER — Telehealth: Payer: Self-pay

## 2024-10-05 ENCOUNTER — Ambulatory Visit: Attending: Internal Medicine | Admitting: Internal Medicine

## 2024-10-05 ENCOUNTER — Ambulatory Visit: Admitting: Orthopedic Surgery

## 2024-10-05 ENCOUNTER — Encounter: Payer: Self-pay | Admitting: Internal Medicine

## 2024-10-05 ENCOUNTER — Other Ambulatory Visit: Payer: Self-pay

## 2024-10-05 VITALS — BP 121/81 | HR 83 | Ht 73.0 in | Wt 290.4 lb

## 2024-10-05 DIAGNOSIS — G5621 Lesion of ulnar nerve, right upper limb: Secondary | ICD-10-CM

## 2024-10-05 DIAGNOSIS — Z9889 Other specified postprocedural states: Secondary | ICD-10-CM

## 2024-10-05 DIAGNOSIS — R0609 Other forms of dyspnea: Secondary | ICD-10-CM | POA: Diagnosis not present

## 2024-10-05 DIAGNOSIS — E782 Mixed hyperlipidemia: Secondary | ICD-10-CM | POA: Diagnosis not present

## 2024-10-05 DIAGNOSIS — I10 Essential (primary) hypertension: Secondary | ICD-10-CM

## 2024-10-05 DIAGNOSIS — I2510 Atherosclerotic heart disease of native coronary artery without angina pectoris: Secondary | ICD-10-CM

## 2024-10-05 DIAGNOSIS — G4733 Obstructive sleep apnea (adult) (pediatric): Secondary | ICD-10-CM

## 2024-10-05 MED ORDER — ISOSORBIDE MONONITRATE ER 60 MG PO TB24: 60.0000 mg | ORAL_TABLET | Freq: Every day | ORAL | 3 refills | Status: AC

## 2024-10-05 MED ORDER — TRELEGY ELLIPTA 200-62.5-25 MCG/ACT IN AEPB: 1.0000 | INHALATION_SPRAY | Freq: Every day | RESPIRATORY_TRACT | 11 refills | Status: AC

## 2024-10-05 NOTE — Patient Instructions (Addendum)
 Medication Instructions:  Your physician has recommended you make the following change in your medication:  STOP: Aspirin   *If you need a refill on your cardiac medications before your next appointment, please call your pharmacy*   Testing/Procedures: Your physician has requested that you have an echocardiogram. Echocardiography is a painless test that uses sound waves to create images of your heart. It provides your doctor with information about the size and shape of your heart and how well your heart's chambers and valves are working. This procedure takes approximately one hour. There are no restrictions for this procedure. Please do NOT wear cologne, perfume, aftershave, or lotions (deodorant is allowed). Please arrive 15 minutes prior to your appointment time.  Please note: We ask at that you not bring children with you during ultrasound (echo/ vascular) testing. Due to room size and safety concerns, children are not allowed in the ultrasound rooms during exams. Our front office staff cannot provide observation of children in our lobby area while testing is being conducted. An adult accompanying a patient to their appointment will only be allowed in the ultrasound room at the discretion of the ultrasound technician under special circumstances. We apologize for any inconvenience.  CARDIAC PET- Your physician has requested that you have a Cardiac Pet Stress Test.   This testing is completed at [facility] ([address] [place], [place] [address]) or [facility] ([address], [place], [place]). Please arrive 30 minutes prior to your scheduled time.  The schedulers will call you to get this scheduled. Please follow further testing instructions below.   Please report to Radiology at the [facility] Main Entrance 30 minutes early for your test.  [address] [place], [place] [address]                         OR   Please  report to Radiology at [facility] Main Entrance, medical mall, 30 mins prior to your test.  [address]  [place], [place]  How to Prepare for Your Cardiac PET/CT Stress Test:  Nothing to eat or drink, except water , 3 hours prior to arrival time.  NO caffeine/decaffeinated products, or chocolate 12 hours prior to arrival. (Please note decaffeinated beverages (teas/coffees) still contain caffeine).  If you have caffeine within 12 hours prior, the test will need to be rescheduled.  Medication instructions: Do not take erectile dysfunction medications for 72 hours prior to test (sildenafil, tadalafil) Do not take nitrates (isosorbide  mononitrate, Ranexa) the day before or day of test Do not take tamsulosin  the day before or morning of test Hold theophylline containing medications for 12 hours. Hold Dipyridamole 48 hours prior to the test.  Diabetic Preparation: If able to eat breakfast prior to 3 hour fasting, you may take all medications, including your insulin. Do not worry if you miss your breakfast dose of insulin - start at your next meal. If you do not eat prior to 3 hour fast-Hold all diabetes (oral and insulin) medications. Patients who wear a continuous glucose monitor MUST remove the device prior to scanning.  You may take your remaining medications with water .  NO  cologne or lotion on chest or abdomen area.   Total time is 1 to 2 hours; you may want to bring reading material for the waiting time.  IF YOU THINK YOU MAY BE PREGNANT, OR ARE NURSING PLEASE INFORM THE TECHNOLOGIST.  In preparation for  your appointment, medication and supplies will be purchased.  Appointment availability is limited, so if you need to cancel or reschedule, please call the Radiology Department Scheduler at [phone] 24 hours in advance to avoid a cancellation fee of $100.00  What to Expect When you Arrive:  Once you arrive and check in for your appointment, you will  be taken to a preparation room within the Radiology Department.  A technologist or Nurse will obtain your medical history, verify that you are correctly prepped for the exam, and explain the procedure.  Afterwards, an IV will be started in your arm and electrodes will be placed on your skin for EKG monitoring during the stress portion of the exam. Then you will be escorted to the PET/CT scanner.  There, staff will get you positioned on the scanner and obtain a blood pressure and EKG.  During the exam, you will continue to be connected to the EKG and blood pressure machines.  A small, safe amount of a radioactive tracer will be injected in your IV to obtain a series of pictures of your heart along with an injection of a stress agent.    After your Exam:  It is recommended that you eat a meal and drink a caffeinated beverage to counter act any effects of the stress agent.  Drink plenty of fluids for the remainder of the day and urinate frequently for the first couple of hours after the exam.  Your doctor will inform you of your test results within 7-10 business days.  For more information and frequently asked questions, please visit our website: [email or web address]  For questions about your test or how to prepare for your test, please call: Cardiac Imaging Nurse Navigators Office: [phone]   Follow-Up: At [facility], you and your health needs are our priority.  As part of our continuing mission to provide you with exceptional heart care, our providers are all part of one team.  This team includes your primary Cardiologist (physician) and Advanced Practice Providers or APPs (Physician Assistants and Nurse Practitioners) who all work together to provide you with the care you need, when you need it.  Your next appointment:   6 month(s)  Provider:   Emeline FORBES Calender, MD

## 2024-10-05 NOTE — Progress Notes (Signed)
 Cardiology Office Note   Date:  10/05/2024  ID:  Norleen LABOR Brad Woods, DOB [birth year], MRN [id number] PCP: Nanci Senior, MD  [facility] Providers Cardiologist:  Emeline FORBES Calender, MD     History of Present Illness Brad Woods is a 78 y.o. 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  He states that he has had chronic dyspnea on exertion with throat tightness which has been stable and has not changed for several years.  It did not improve much after changing Brilinta  to Plavix .  He has not had any chest pain associated with this.  Otherwise no swelling or orthopnea  Tobacco use: No Alcohol use: Rare Activity level: Not very active due to arthritis    ROS:  Review of Systems  All other systems reviewed and are negative.   Physical Exam  Physical Exam Vitals and nursing note reviewed.  Constitutional:      Appearance: Normal appearance.  HENT:     Head: Normocephalic and atraumatic.  Eyes:     Conjunctiva/sclera: Conjunctivae normal.  Neck:     Vascular: No carotid bruit.  Cardiovascular:     Rate and Rhythm: Normal rate and regular rhythm.  Pulmonary:     Effort: Pulmonary effort is normal.     Breath sounds: Normal breath sounds.  Musculoskeletal:        General: No swelling or tenderness.  Skin:    Coloration: Skin is not jaundiced or pale.  Neurological:     Mental Status: He is alert.     VS:  BP 121/81 (BP Location: Right Arm)   Pulse 83   Ht 6' 1 (1.854 m)   Wt 290 lb 6.4 oz (131.7 kg)   SpO2 95%   BMI 38.31 kg/m         Wt Readings from Last 3 Encounters:  10/05/24 290 lb 6.4 oz (131.7 kg)  09/29/24 292 lb 3.2 oz  (132.5 kg)  04/24/24 282 lb 3 oz (128 kg)     EKG Interpretation Date/Time:    Ventricular Rate:    PR Interval:    QRS Duration:    QT Interval:    QTC Calculation:   R Axis:      Text Interpretation:      Studies Reviewed   Echocardiogram 08/11/2020:   1. Left ventricular ejection fraction, by estimation, is 55 to 60%. The  left ventricle has normal function. The left ventricle has no regional  wall motion abnormalities. Left ventricular diastolic parameters are  indeterminate.   2. Right ventricular systolic function is normal. The right ventricular  size is normal. Tricuspid regurgitation signal is inadequate for assessing  PA pressure.   3. The mitral valve is normal in structure. No evidence of mitral valve  regurgitation. No evidence of mitral stenosis.   4. The aortic valve is tricuspid. Aortic valve regurgitation is trivial.  No aortic stenosis is present.   5. The inferior vena cava is normal in size with greater than 50%  respiratory variability, suggesting right atrial pressure of 3 mmHg.   Comparison(s): No significant change from prior study. 05/19/16 EF 60-65%.   Left heart catheterization 05/31/2020: Multivessel CAD with diffuse irregularity of the LAD  with 40% ostial stenosis, 40% stenosis in the first diagonal vessel 5 x 30% stenosis in a region of aneurysmal dilatation in the LAD between the first and second diagonal vessel. There is diffuse 50% stenoses in the mid LAD second diagonal vessel and focal 75% apical LAD stenosis and small caliber apical segment. The left circumflex stent is widely patent. There is 20% narrowing in the OM1 vessel. 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 There is 7075% ostial stenosis of the PDA vessel with diffuse 50% mid stenosis not significantly changed from the 2017 angiographic studies.    Risk Assessment/Calculations              ASSESSMENT  Chronic CAD s/p PCI with DES to LCx in 2017 stable.   On aspirin  and Plavix , Imdur  60 mg, carvedilol  12.5 mg twice daily Chronic dyspnea on exertion possibly anginal equivalent given residual CAD versus due to diastolic dysfunction.  Has been stable over time but has not had an ischemic workup since 2021.   Diastolic dysfunction echocardiogram states indeterminant diastolic function however on personal review there is reduced septal and lateral e' velocities with E/A: 0.85 indicating at least grade 1 diastolic dysfunction. Hypertension stable Hyperlipidemia stable on Repatha  due to statin intolerance Obesity Mixed central and obstructive sleep apnea being set up for BiPAP   Plan  Echocardiogram PET/CT for ischemic evaluation to evaluate for possible progression of CAD Can consider starting on SGLT2 if ischemic workup is stable Discontinue aspirin  and continue Plavix  monotherapy  Follow up: 6 months          Signed, Emeline FORBES Calender, MD

## 2024-10-05 NOTE — Telephone Encounter (Signed)
 [employer] paperwork signed and faxed in

## 2024-10-05 NOTE — Progress Notes (Signed)
   Brad Woods - 78 y.o. male MRN [id number]  Date of birth: [birth year]  Office Visit Note: Visit Date: 10/05/2024 PCP: Nanci Senior, MD Referred by: Nanci Senior, MD  Subjective:  HPI: Brad Woods is a 78 y.o. 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  He continues to have persistent numbness within the small finger distally, ring finger ulnar aspect continues to have improved sensation.  Range of motion of the small finger is appropriate, no residual clicking or locking.   Pertinent ROS were reviewed with the patient and found to be negative unless otherwise specified above in HPI.   Assessment & Plan: Visit Diagnoses:  1. Ulnar tunnel syndrome of right wrist   2. S/P trigger finger release      Plan: He is doing well overall from a range of motion standpoint.  Continues to have some persistent numbness at the distal aspect of the small finger, there is improvement in the ring finger from a sensation standpoint, he is appropriate 2 point discrimination in this distribution.  The small finger remains quite numb to both light touch and 2-point discrimination.  He did express the understanding of this slow recovery with nerve regeneration and the potential for persistent symptoms in these distributions.  I recommended he return in approximately 3 months for repeat check.  Follow-up: No follow-ups on file.   Meds & Orders: No orders of the defined types were placed in this encounter.  No orders of the defined types were placed in this encounter.    Procedures: No procedures performed       Objective:   Vital Signs: There were no vitals taken for this visit.  Ortho Exam Right wrist and hand: - Well-healed incisional site over the ulnar aspect of the wrist, no erythema or drainage - Wrist range of motion  flexion/extension 65/55, improved passively - Composite fist without significant restriction, small finger without residual clicking or locking, well-healed incision at the basilar aspect of the small finger - 2 point discrimination in the small finger is indiscernible, ulnar aspect of the ring finger 2-point discrimination is between 5 and 6 mm - Hand remains warm well-perfused  Imaging: No results found.   Brad Woods, M.D. [facility], Hand Surgery

## 2024-10-05 NOTE — Telephone Encounter (Signed)
 Reach out to patient he drop PAP forms off and he did not sign them I need him to sign before I can fax them to [employer]

## 2024-10-15 DIAGNOSIS — G5621 Lesion of ulnar nerve, right upper limb: Secondary | ICD-10-CM | POA: Diagnosis not present

## 2024-10-15 NOTE — Progress Notes (Signed)
 Orthopaedic Surgery Hand and Upper Extremity History and Physical Examination  CC: Right hand paresthesias  HPI 10/15/2024: History of Present Illness The patient, a 78 year old male, presents as a new patient with complaints of right hand paresthesia and dysesthesia.  The onset of symptoms began in approximately October following shoulder surgery performed by Dr. Addie. In October 2024, he consulted Dr. Erwin at [facility], where electrodiagnostic studies indicated ulnar nerve entrapment at [facility] canal. 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 Walt on 04/24/2024, which included ulnar nerve release at the wrist, ulnar artery repair post-aneurysm resection, and trigger digit release of the right small finger. The patient reports no preoperative issues with his hand aside from occasional trigger finger in the right pinky finger. Postoperatively, he developed persistent hand paresthesia, which he reported during follow-up visits. Despite two consultations at [facility], the symptoms persisted. A subsequent nerve conduction study by a hand specialist identified normal nerve function except for the ulnar nerve. He underwent ulnar nerve surgery, which initially exacerbated his symptoms but later showed improvement. He describes evening dysesthesia characterized by a burning sensation and pins and needles sensation extending slightly into his arm. Dr. Erwin initially advised a recovery period of 6 weeks to one month post-surgery in June 2025; however, the patient's condition has remained unchanged since the shoulder surgery. He experiences constant dysesthesia when his hand contacts sheets at night and a stretching sensation upon movement. Post-hand surgery physical therapy was completed, with the therapist attributing symptoms to the shoulder surgery. The patient also underwent successful carpal tunnel release and trigger finger treatment in two fingers.  A recent consultation with Dr. Erwin indicated that recovery could span years, with no further interventions available. Seeking a second opinion due to significant distress, the patient reports trialing gabapentin , which caused drowsiness and provided minimal relief. He has not trialed amitriptyline. Preoperatively, he received a cortisone injection in his little finger, which was painful and ineffective. He has engaged in scar massage and desensitization therapy, initially presenting with limited hand mobility, which has since improved. He is no longer under the care of his therapist.  MEDICATIONS Discontinued: Gabapentin    Problem List:  Problem List[1]  Past Medical History: Medical History[2]   Medications: Current Rx ordered in [facility][3]  Allergies: Allergies as of 10/15/2024 - Reviewed 10/15/2024  Allergen Reaction Noted  . Hydromorphone Other (See Comments) 04/01/2014  . Naproxen sodium Dermatitis 01/17/2017    Past Surgical History: Surgical History[4]   Social History: Social History   Occupational History  . Not on file  Tobacco Use  . Smoking status: Never  . Smokeless tobacco: Never  Substance and Sexual Activity  . Alcohol use: Yes    Comment: social  . Drug use: Not Currently  . Sexual activity: Not on file     Family History: Family History[5] Otherwise, no relevant orthopaedic family history  ROS: Review of Systems: All systems reviewed and are negative except that mentioned in HPI  Work/Sport/Hobbies: See HPI  Physical Examination: Vitals:   10/15/24 0857  BP: 139/68  Pulse: 69  Temp: 97.3 F (36.3 C)   Constitutional: Awake, alert.  WN/WD Appearance: healthy, no acute distress, well-groomed Affect: Normal HEENT: EOMI, mucous membranes moist CV: RRR Pulm: breathing comfortably  Right upper Extremity / Hand Physical Exam On exam of the right upper extremity, there is no evidence of ulnar clawing. A well-healed scar is present in the  palm from prior carpal tunnel release as well as a  transverse scar over the A1 pulleys of the long and ring fingers. A healing slightly hypertrophic scar is noted over the ulnar portion of the wrist and distal forearm from the most recent surgery with tenderness over the scar. Wrist flexion and extension both reach 80 degrees. Paresthesia is observed in the ulnar nerve distribution. Two-point discrimination exam measures 8 mm for the index, 6 mm for the thumb and long finger, 10 mm for the ring finger, and 15 mm for the small finger. No obvious ulnar nerve subluxation is detected at the elbow. Flex compression test at the right elbow is equivocal. Spurling exam on the right side is negative. First dorsal interossei strength on the right side is 5-/5 compared to 5/5 on the left.  Results: Results     Assessment/Plan:  Assessment & Plan 1. Right hand numbness and tingling - Symptoms indicative of ulnar nerve involvement, known for prolonged recovery period - Positive Tinel's sign suggests possible nerve regeneration or nerve compression (compression at midforearm level is uncommon) - Detailed discussion on ulnar nerve injuries and recovery timeline - New order for hand therapy for desensitization - Apply vitamin E oil to scar to aid in breaking up scar tissue - Prescription for amitriptyline vs gabapentin  discussed, deferred at this time. - Steroid injection into scar tissue discussed, deferred at this time  Follow-up - Patient to follow up in 8 weeks    Marsa HERO. Chiaramonti, MD Hand and Upper Extremity Surgery [facility] Department of Orthopaedic Surgery [facility] 10/15/2024 9:52 AM      [1] Patient Active Problem List Diagnosis  . 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 Arthritis   . Asthma   . Asthma   . CAD (coronary artery disease)   . Sleep apnea   [3] Meds  Ordered in [facility]  Medication Sig Dispense Refill  . nitroglycerin  (NITROSTAT ) 0.4 mg SL tablet Place under the tongue. max of 3 doses, then 911    . Trelegy Ellipta  200-62.5-25 mcg inhaler Inhale 1 puff daily.    . albuterol  HFA (PROVENTIL  HFA;VENTOLIN  HFA;PROAIR  HFA) 90 mcg/actuation inhaler INHALE TWO puffs into THE lungs every SIX hours AS NEEDED    . carvediloL  (COREG ) 12.5 mg tablet Take 12.5 mg by mouth 2 (two) times a day.    . clopidogreL  (PLAVIX ) 75 mg tablet Take 75 mg by mouth Once Daily.    . finasteride  (PROSCAR ) 5 mg tablet Take 5 mg by mouth Once Daily.    . fluticasone propionate (FLONASE) 50 mcg/spray nasal spray One spray in each nostril once a day    . isosorbide  mononitrate (IMDUR ) 30 mg 24 hr tablet Take 30 mg by mouth Once Daily.    . Repatha  SureClick 140 mg/mL pnij INJECT ONE pen into THE SKIN every 14 DAYS    . tamsulosin  (FLOMAX ) 0.4 mg cap Take 0.4 mg by mouth Once Daily.     No current [facility]-ordered facility-administered medications on file.  [4] Past Surgical History: Procedure Laterality Date  . CHOLECYSTECTOMY    . COLON SURGERY     Procedure: COLON SURGERY; 1973  . HAND SURGERY Right   . HERNIA REPAIR     Procedure: HERNIA REPAIR; 1956  . REPLACEMENT TOTAL KNEE BILATERAL Bilateral    Procedure: REPLACEMENT TOTAL KNEE BILATERAL  . SHOULDER SURGERY Right   [5] Family History Problem Relation Name Age of Onset  . Diabetes Mother    .  Arthritis Mother    . Heart disease Mother    . Diabetes Father    . Heart disease Father    . Diabetes Brother

## 2024-10-26 ENCOUNTER — Other Ambulatory Visit: Payer: Self-pay | Admitting: Internal Medicine

## 2024-10-26 ENCOUNTER — Encounter (HOSPITAL_COMMUNITY): Payer: Self-pay

## 2024-10-26 ENCOUNTER — Encounter: Payer: Self-pay | Admitting: Radiology

## 2024-10-26 DIAGNOSIS — Z85828 Personal history of other malignant neoplasm of skin: Secondary | ICD-10-CM | POA: Diagnosis not present

## 2024-10-26 DIAGNOSIS — L738 Other specified follicular disorders: Secondary | ICD-10-CM | POA: Diagnosis not present

## 2024-10-26 DIAGNOSIS — R0609 Other forms of dyspnea: Secondary | ICD-10-CM

## 2024-10-26 DIAGNOSIS — L570 Actinic keratosis: Secondary | ICD-10-CM | POA: Diagnosis not present

## 2024-10-26 DIAGNOSIS — I2510 Atherosclerotic heart disease of native coronary artery without angina pectoris: Secondary | ICD-10-CM

## 2024-10-26 DIAGNOSIS — D692 Other nonthrombocytopenic purpura: Secondary | ICD-10-CM | POA: Diagnosis not present

## 2024-10-26 NOTE — Progress Notes (Signed)
 PET/CT consent orders placed. Discussed with patient in person on 10/05/24.

## 2024-10-26 NOTE — Addendum Note (Signed)
 Addended by: MELIDA ROLIN HERO on: 10/26/2024 11:29 AM   Modules accepted: Orders

## 2024-10-28 ENCOUNTER — Ambulatory Visit: Admitting: Physician Assistant

## 2024-10-28 ENCOUNTER — Ambulatory Visit (HOSPITAL_COMMUNITY)
Admission: RE | Admit: 2024-10-28 | Discharge: 2024-10-28 | Disposition: A | Discharge status: Home / Self Care | Source: Ambulatory Visit | Attending: Internal Medicine | Admitting: Internal Medicine

## 2024-10-28 DIAGNOSIS — R0609 Other forms of dyspnea: Secondary | ICD-10-CM | POA: Diagnosis not present

## 2024-10-28 DIAGNOSIS — I2510 Atherosclerotic heart disease of native coronary artery without angina pectoris: Secondary | ICD-10-CM | POA: Diagnosis not present

## 2024-10-28 LAB — NM PET CT CARDIAC PERFUSION MULTI W/ABSOLUTE BLOODFLOW
LV dias vol: 125 mL (ref 62–150)
LV sys vol: 47 mL (ref 4.2–5.8)
MBFR: 1.57
Nuc Rest EF: 62 %
Nuc Stress EF: 63 %
Peak HR: 84 {beats}/min
Rest HR: 66 {beats}/min
Rest MBF: 0.89 ml/g/min
Rest Nuclear Isotope Dose: 29.9 mCi
ST Depression (mm): 0 mm
Stress MBF: 1.4 ml/g/min
Stress Nuclear Isotope Dose: 29.9 mCi

## 2024-10-28 MED ORDER — RUBIDIUM RB82 GENERATOR (RUBYFILL)
29.9400 | PACK | Freq: Once | INTRAVENOUS | Status: AC
  Administered 2024-10-28: 29.94 via INTRAVENOUS

## 2024-10-28 MED ORDER — RUBIDIUM RB82 GENERATOR (RUBYFILL)
29.9100 | PACK | Freq: Once | INTRAVENOUS | Status: AC
  Administered 2024-10-28: 29.91 via INTRAVENOUS

## 2024-10-28 MED ORDER — REGADENOSON 0.4 MG/5ML IV SOLN
INTRAVENOUS | Status: AC
  Filled 2024-10-28: qty 5

## 2024-10-28 MED ORDER — REGADENOSON 0.4 MG/5ML IV SOLN
0.4000 mg | Freq: Once | INTRAVENOUS | Status: AC
  Administered 2024-10-28: 0.4 mg via INTRAVENOUS

## 2024-10-28 NOTE — Progress Notes (Signed)
 Pt tolerated lexiscan. Endorsed SOB and light-headedness at start, along with some jaw tightness. Some dizziness at end of scan, other symptoms all resolved. Given PO caffeine and ambulated back to waiting room.

## 2024-10-29 ENCOUNTER — Ambulatory Visit: Admitting: Physician Assistant

## 2024-10-29 ENCOUNTER — Other Ambulatory Visit (INDEPENDENT_AMBULATORY_CARE_PROVIDER_SITE_OTHER): Payer: Self-pay

## 2024-10-29 ENCOUNTER — Encounter: Payer: Self-pay | Admitting: Physician Assistant

## 2024-10-29 ENCOUNTER — Ambulatory Visit: Payer: Self-pay | Admitting: Internal Medicine

## 2024-10-29 VITALS — Ht 71.85 in | Wt 292.6 lb

## 2024-10-29 DIAGNOSIS — M25561 Pain in right knee: Secondary | ICD-10-CM

## 2024-10-29 DIAGNOSIS — M25562 Pain in left knee: Secondary | ICD-10-CM

## 2024-10-29 DIAGNOSIS — G8929 Other chronic pain: Secondary | ICD-10-CM

## 2024-10-29 NOTE — Progress Notes (Signed)
 HPI: [name] returns today with bilateral knee pain.  Given the history of bilateral knee replacements in 2014 in [place].  He has had bilateral knee pain for some years now.  He has been told that he needed a poly exchange with upsizing of both knees.  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  He has been taking ibuprofen for his knee pain.  However he is on chronic Plavix .  Last week he had a nuclear stress test and is due for an echocardiogram next week.  He will follow-up with his cardiologist thereafter.  Review of systems: Denies any chest pain shortness of breath fevers chills.  Physical exam: General well-developed well-nourished male in no acute distress mood and affect appropriate.  Able to get on and off the exam table on his own. Respirations: Unlabored Bilateral knees: Good range of motion of both knees.  No abnormal warmth erythema of either knee.  Positive effusion bilateral knees.  No instability valgus varus stressing of either knee.  Positive anterior drawer of both knees.  Radiographs: Right knee 2 views shows: Well-seated total arthroplasty components.  No acute fracture or acute injuries.  Knee is well located. Left knee 2 views: No acute fracture no bony lesions.  Status post left total knee arthroplasty well-seated components.   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  He would like to start with the left knee.  Will schedule him once he has been approved by cardiology.  He will need to come off of his Plavix .  Risk benefits of surgery discussed.  Postop protocol discussed.  Questions were encouraged and answered.   Risk include but is not limited to DVT/PE, nerve vessel injury, infection, wound healing problems, prolonged pain worsening pain and blood loss.

## 2024-11-05 ENCOUNTER — Telehealth (HOSPITAL_BASED_OUTPATIENT_CLINIC_OR_DEPARTMENT_OTHER): Payer: Self-pay | Admitting: *Deleted

## 2024-11-05 ENCOUNTER — Ambulatory Visit (HOSPITAL_COMMUNITY)
Admission: RE | Admit: 2024-11-05 | Discharge: 2024-11-05 | Disposition: A | Discharge status: Home / Self Care | Source: Ambulatory Visit | Attending: Internal Medicine | Admitting: Internal Medicine

## 2024-11-05 DIAGNOSIS — R0609 Other forms of dyspnea: Secondary | ICD-10-CM

## 2024-11-05 LAB — ECHOCARDIOGRAM COMPLETE
AR max vel: 3.44 cm2
AV Area VTI: 2.99 cm2
AV Area mean vel: 3.38 cm2
AV Mean grad: 2 mmHg
AV Peak grad: 3.8 mmHg
Ao pk vel: 0.97 m/s
Area-P 1/2: 3.27 cm2
MV M vel: 1.08 m/s
MV Peak grad: 4.7 mmHg
P 1/2 time: 821 ms
S' Lateral: 2.7 cm

## 2024-11-05 MED ORDER — PERFLUTREN LIPID MICROSPHERE
1.0000 mL | INTRAVENOUS | Status: AC | PRN
  Administered 2024-11-05: 2 mL via INTRAVENOUS

## 2024-11-05 NOTE — Telephone Encounter (Signed)
   Pre-operative Risk Assessment    Patient Name: Brad Woods  DOB: July 06, 1946 MRN: [id number]   Date of last office visit: 10/05/24 DR. SEGAL Date of next office visit: NONE   Request for Surgical Clearance    Procedure:  LEFT KNEE ARTHROPLASTY/POLY EXCHANGE  Date of Surgery:  Clearance TBD                                Surgeon:  DR. LONNI POLI Surgeon's Group or Practice Name:  [facility] Phone number:  [phone] Fax number:  [phone] SHERRIE   Type of Clearance Requested:   - Medical  - Pharmacy:  Hold Clopidogrel  (Plavix )     Type of Anesthesia:  CHOICE ( SPINAL vs GENERAL)   Additional requests/questions:    Bonney Niels Jest   11/05/2024, 3:02 PM

## 2024-11-06 NOTE — Telephone Encounter (Signed)
 Hi Dr. Kriste. You recently saw this patient in clinic on 10/05/2024. I see that he had a cardiac PET stress test and Echo done. Are you able to formally comment on surgical clearance for upcoming knee surgery (date of surgery TBD)? Also, can you please provide recommendations for holding Plavix . Please route your response to P CV DIV PREOP. Thank you!   ~Wynonna Fitzhenry

## 2024-11-09 ENCOUNTER — Telehealth: Payer: Self-pay | Admitting: Orthopaedic Surgery

## 2024-11-09 ENCOUNTER — Telehealth: Admitting: *Deleted

## 2024-11-09 NOTE — Telephone Encounter (Signed)
 He has had chronic dyspnea which has not changed over time and is stable for years. He had an echo which was grossly unremarkable. 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   Despite the low RCRI of 1 (CAD) he does have significant multivessel disease. As long as his symptoms done change he is considered intermediate perioperative cardiac risk for the anticipated intermediate risk procedure. No further cardiac workup needed unless his symptoms change in quality. Plan was also discussed with Dr. Mady with interventional cardiology who also looked at the images/films and agrees with this plan. Ideally he would continue on Plavix  but if he needs to stop for the surgery then I would defer to the surgeons and restart as soon as possible.   Thank you, Emeline Calender, DO

## 2024-11-09 NOTE — Telephone Encounter (Signed)
 I called patient and advised that cardiac clearance requested last week.  I do see it was received and is awaiting Dr. Kriste to comment.  Will call patient back and schedule once we know he is cleared.

## 2024-11-09 NOTE — Telephone Encounter (Signed)
 Copied from CRM #[id number]. Topic: Clinical - Request for Lab/Test Order >> Nov 09, 2024 10:34 AM Ismael A wrote: Reason for CRM: received call from Debbie w/ [employer] ph# [phone] - she is following up on a prior auth for a sleep study, states she received the request but no clinicals were attached to it, states only notes on the request were sleep apnea but that is not sufficient - states there was a previous approval for a sleep study back in February auth [id number] dos 02/02/24 - 05/02/24 -  requesting a follow up asap as this request is set to expire today - fax any [phone] reference # [id number] clinic attn: Marval Huge to [facility] as she does the [place].

## 2024-11-09 NOTE — Telephone Encounter (Signed)
 Pt called stating he would like to move forward with left knee clean up and fix replacement. Please call pt at [phone].

## 2024-11-10 ENCOUNTER — Telehealth: Payer: Self-pay

## 2024-11-10 NOTE — Telephone Encounter (Signed)
   Name: Brad Woods  DOB: 1946/02/01  MRN: [id number]   Primary Cardiologist: Emeline FORBES Calender, MD  Chart reviewed as part of pre-operative protocol coverage. [name] was last seen on 10/05/24 by Dr. Calender.   Per Dr. Calender: He has had chronic dyspnea which has not changed over time and is stable for years. He had an echo which was grossly unremarkable. 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    Despite the low RCRI of 1 (CAD) he does have significant multivessel disease. As long as his symptoms done change he is considered intermediate perioperative cardiac risk for the anticipated intermediate risk procedure. No further cardiac workup needed unless his symptoms change in quality. Plan was also discussed with Dr. Mady with interventional cardiology who also looked at the images/films and agrees with this plan. Ideally he would continue on Plavix  but if he needs to stop for the surgery then I would defer to the surgeons and restart as soon as possible.    Therefore, based on ACC/AHA guidelines, the patient would be at acceptable risk for the planned procedure without further cardiovascular testing.    I will route this recommendation to the requesting party via Epic fax function and remove from pre-op pool. Please call with questions.  [name] Garre Kiandria Clum, PA 11/10/2024, 8:37 AM

## 2024-11-10 NOTE — Telephone Encounter (Signed)
 Cardiology clearance received.  I called patient and left a voice mail for a return call to discuss scheduling surgery.

## 2024-11-18 ENCOUNTER — Other Ambulatory Visit: Payer: Self-pay | Admitting: Physician Assistant

## 2024-11-18 DIAGNOSIS — Z01818 Encounter for other preprocedural examination: Secondary | ICD-10-CM

## 2024-11-18 NOTE — Progress Notes (Signed)
 Sent message, via epic in basket, requesting orders in epic from Careers adviser.

## 2024-11-24 DIAGNOSIS — G4733 Obstructive sleep apnea (adult) (pediatric): Secondary | ICD-10-CM | POA: Diagnosis not present

## 2024-11-24 DIAGNOSIS — R7303 Prediabetes: Secondary | ICD-10-CM | POA: Diagnosis not present

## 2024-11-24 NOTE — Progress Notes (Signed)
 COVID Vaccine received:  []  No [x]  Yes Date of any COVID positive Test in last 90 days: no PCP - Dr. Garnette Gleason Cardiologist - Emeline Calender DO  Chest x-ray - 03/15/24 Epic EKG -  03/18/24 Epic Stress Test - 10/28/24 Epic ECHO - 11/05/24 Epic Cardiac Cath - 05/31/20 Epic  Cardiac clearance- Dr. Emeline Calender- 11/09/24  Bowel Prep - [x]  No  []   Yes ______  Pacemaker / ICD device [x]  No []  Yes   Spinal Cord Stimulator:[x]  No []  Yes       History of Sleep Apnea? [x]  No []  Yes   CPAP used?- [x]  No []  Yes    Does the patient monitor blood sugar?          [x]  No []  Yes  []  N/A  Patient has: [x]  NO Hx DM   []  Pre-DM                 []  DM1  []   DM2 Does patient have a [employer] or Dexacom? []  No []  Yes   Fasting Blood Sugar Ranges-  Checks Blood Sugar _____ times a day  GLP1 agonist / usual dose - Wegovy last dose 11/26/24. Instructed to hold till after surgery. GLP1 instructions:  SGLT-2 inhibitors / usual dose - no SGLT-2 instructions:   Blood Thinner / Instructions:Plavix  hold 5 days prior to surgery. Last dose to be 11/28/24. Aspirin  Instructions:  Comments:   Activity level: Patient is able to climb a flight of stairs without difficulty; [x]  No CP  []  No SOB,    Patient can perform ADLs without assistance.   Anesthesia review: CAD, MI, stent, HTN, OSA, PVD, CVA  Patient denies shortness of breath, fever, cough and chest pain at PAT appointment.  Patient verbalized understanding and agreement to the Pre-Surgical Instructions that were given to them at this PAT appointment. Patient was also educated of the need to review these PAT instructions again prior to his/her surgery.I reviewed the appropriate phone numbers to call if they have any and questions or concerns.

## 2024-11-24 NOTE — Patient Instructions (Addendum)
 SURGICAL WAITING ROOM VISITATION  Patients having surgery or a procedure may have no more than 2 support people in the waiting area - these visitors may rotate.    Children under the age of 48 must have an adult with them who is not the patient.  Visitors with respiratory illnesses are discouraged from visiting and should remain at home.  If the patient needs to stay at the hospital during part of their recovery, the visitor guidelines for inpatient rooms apply. Pre-op nurse will coordinate an appropriate time for 1 support person to accompany patient in pre-op.  This support person may not rotate.    Please refer to the [facility] website for the visitor guidelines for Inpatients (after your surgery is over and you are in a regular room).       Your procedure is scheduled on: 12/04/24   Report to [facility] Main Entrance    Report to admitting at 11:30 AM   Call this number if you have problems the morning of surgery [phone]   Do not eat food :After Midnight.   After Midnight you may have the following liquids until 11 AM DAY OF SURGERY  Water  Non-Citrus Juices (without pulp, NO RED-Apple, White grape, White cranberry) Black Coffee (NO MILK/CREAM OR CREAMERS, sugar ok)  Clear Tea (NO MILK/CREAM OR CREAMERS, sugar ok) regular and decaf                             Plain Jell-O (NO RED)                                           Fruit ices (not with fruit pulp, NO RED)                                     Popsicles (NO RED)                                                               Sports drinks like Gatorade (NO RED)                   The day of surgery:  Drink ONE (1) Pre-Surgery Clear Ensure at 11 AM the morning of surgery. Drink in one sitting. Do not sip.  This drink was given to you during your hospital  pre-op appointment visit. Nothing else to drink after completing the  Pre-Surgery Clear Ensure .    Oral Hygiene is also important to reduce your  risk of infection.                                    Remember - BRUSH YOUR TEETH THE MORNING OF SURGERY WITH YOUR REGULAR TOOTHPASTE  DENTURES WILL BE REMOVED PRIOR TO SURGERY PLEASE DO NOT APPLY Poly grip OR ADHESIVES!!!     Stop all vitamins and herbal supplements 7 days before surgery.   Take these medicines the morning of surgery with A SIP OF WATER : carvedilol ,  finasteride , isosorbide , nasal spray, inhaler, tamsulosin                HOLD PLAVIX  5 DAYS PRIOR TO SURGERY> LAST DOSE TO BE 11/28/24.  Bring CPAP mask and tubing day of surgery.                              You may not have any metal on your body including hair pins, jewelry, and body piercing             Do not wear make-up, lotions, powders, perfumes/cologne, or deodorant              Men may shave face and neck.   Do not bring valuables to the hospital. [facility] IS NOT             RESPONSIBLE   FOR VALUABLES.   Contacts, glasses, dentures or bridgework may not be worn into surgery.   Bring small overnight bag day of surgery.   DO NOT BRING YOUR HOME MEDICATIONS TO THE HOSPITAL. PHARMACY WILL DISPENSE MEDICATIONS LISTED ON YOUR MEDICATION LIST TO YOU DURING YOUR ADMISSION IN THE HOSPITAL!    Patients discharged on the day of surgery will not be allowed to drive home.  Someone NEEDS to stay with you for the first 24 hours after anesthesia.   Special Instructions: Bring a copy of your healthcare power of attorney and living will documents the day of surgery if you haven't scanned them before.              Please read over the following fact sheets you were given: IF YOU HAVE QUESTIONS ABOUT YOUR PRE-OP INSTRUCTIONS PLEASE CALL [phone] [place]   If you received a COVID test during your pre-op visit  it is requested that you wear a mask when out in public, stay away from anyone that may not be feeling well and notify your surgeon if you develop symptoms. If you test positive for Covid or have been in contact  with anyone that has tested positive in the last 10 days please notify you surgeon.      Pre-operative 4 CHG Bath Instructions  DYNA-Hex 4 Chlorhexidine  Gluconate 4% Solution Antiseptic 4 fl. oz   You can play a key role in reducing the risk of infection after surgery. Your skin needs to be as free of germs as possible. You can reduce the number of germs on your skin by washing with CHG (chlorhexidine  gluconate) soap before surgery. CHG is an antiseptic soap that kills germs and continues to kill germs even after washing.   DO NOT use if you have an allergy to chlorhexidine /CHG or antibacterial soaps. If your skin becomes reddened or irritated, stop using the CHG and notify one of our RNs at   Please shower with the CHG soap starting 4 days before surgery using the following schedule:     Please keep in mind the following:  DO NOT shave, including legs and underarms, starting the day of your first shower.   You may shave your face at any point before/day of surgery.  Place clean sheets on your bed the day you start using CHG soap. Use a clean washcloth (not used since being washed) for each shower. DO NOT sleep with pets once you start using the CHG.  CHG Shower Instructions:  If you choose to wash your hair and private area, wash first with your normal shampoo/soap.  After  you use shampoo/soap, rinse your hair and body thoroughly to remove shampoo/soap residue.  Turn the water  OFF and apply about 3 tablespoons (45 ml) of CHG soap to a CLEAN washcloth.  Apply CHG soap ONLY FROM YOUR NECK DOWN TO YOUR TOES (washing for 3-5 minutes)  DO NOT use CHG soap on face, private areas, open wounds, or sores.  Pay special attention to the area where your surgery is being performed.  If you are having back surgery, having someone wash your back for you may be helpful. Wait 2 minutes after CHG soap is applied, then you may rinse off the CHG soap.  Pat dry with a clean towel  Put on clean  clothes/pajamas   If you choose to wear lotion, please use ONLY the CHG-compatible lotions on the back of this paper.     Additional instructions for the day of surgery: DO NOT APPLY any lotions, deodorants, cologne, or perfumes.   Put on clean/comfortable clothes.  Brush your teeth.  Ask your nurse before applying any prescription medications to the skin.   CHG Compatible Lotions   Aveeno Moisturizing lotion  Cetaphil Moisturizing Cream  Cetaphil Moisturizing Lotion  Clairol Herbal Essence Moisturizing Lotion, Dry Skin  Clairol Herbal Essence Moisturizing Lotion, Extra Dry Skin  Clairol Herbal Essence Moisturizing Lotion, Normal Skin  Curel Age Defying Therapeutic Moisturizing Lotion with Alpha Hydroxy  Curel Extreme Care Body Lotion  Curel Soothing Hands Moisturizing Hand Lotion  Curel Therapeutic Moisturizing Cream, Fragrance-Free  Curel Therapeutic Moisturizing Lotion, Fragrance-Free  Curel Therapeutic Moisturizing Lotion, Original Formula  Eucerin Daily Replenishing Lotion  Eucerin Dry Skin Therapy Plus Alpha Hydroxy Crme  Eucerin Dry Skin Therapy Plus Alpha Hydroxy Lotion  Eucerin Original Crme  Eucerin Original Lotion  Eucerin Plus Crme Eucerin Plus Lotion  Eucerin TriLipid Replenishing Lotion  Keri Anti-Bacterial Hand Lotion  Keri Deep Conditioning Original Lotion Dry Skin Formula Softly Scented  Keri Deep Conditioning Original Lotion, Fragrance Free Sensitive Skin Formula  Keri Lotion Fast Absorbing Fragrance Free Sensitive Skin Formula  Keri Lotion Fast Absorbing Softly Scented Dry Skin Formula  Keri Original Lotion  Keri Skin Renewal Lotion Keri Silky Smooth Lotion  Keri Silky Smooth Sensitive Skin Lotion  Nivea Body Creamy Conditioning Oil  Nivea Body Extra Enriched Lotion  Nivea Body Original Lotion  Nivea Body Sheer Moisturizing Lotion Nivea Crme  Nivea Skin Firming Lotion  NutraDerm 30 Skin Lotion  NutraDerm Skin Lotion  NutraDerm Therapeutic Skin  Cream  NutraDerm Therapeutic Skin Lotion  ProShield Protective Hand Cream Incentive Spirometer (Watch this video at home: [email or web address])  An incentive spirometer is a tool that can help keep your lungs clear and active. This tool measures how well you are filling your lungs with each breath. Taking long deep breaths may help reverse or decrease the chance of developing breathing (pulmonary) problems (especially infection) following: A long period of time when you are unable to move or be active. BEFORE THE PROCEDURE  If the spirometer includes an indicator to show your best effort, your nurse or respiratory therapist will set it to a desired goal. If possible, sit up straight or lean slightly forward. Try not to slouch. Hold the incentive spirometer in an upright position. INSTRUCTIONS FOR USE  Sit on the edge of your bed if possible, or sit up as far as you can in bed or on a chair. Hold the incentive spirometer in an upright position. Breathe out normally. Place the mouthpiece in your mouth and seal your lips tightly  around it. Breathe in slowly and as deeply as possible, raising the piston or the ball toward the top of the column. Hold your breath for 3-5 seconds or for as long as possible. Allow the piston or ball to fall to the bottom of the column. Remove the mouthpiece from your mouth and breathe out normally. Rest for a few seconds and repeat Steps 1 through 7 at least 10 times every 1-2 hours when you are awake. Take your time and take a few normal breaths between deep breaths. The spirometer may include an indicator to show your best effort. Use the indicator as a goal to work toward during each repetition. After each set of 10 deep breaths, practice coughing to be sure your lungs are clear. If you have an incision (the cut made at the time of surgery), support your incision when coughing by placing a pillow or rolled up towels firmly against it. Once  you are able to get out of bed, walk around indoors and cough well. You may stop using the incentive spirometer when instructed by your caregiver.  RISKS AND COMPLICATIONS Take your time so you do not get dizzy or light-headed. If you are in pain, you may need to take or ask for pain medication before doing incentive spirometry. It is harder to take a deep breath if you are having pain. AFTER USE Rest and breathe slowly and easily. It can be helpful to keep track of a log of your progress. Your caregiver can provide you with a simple table to help with this. If you are using the spirometer at home, follow these instructions: SEEK MEDICAL CARE IF:  You are having difficultly using the spirometer. You have trouble using the spirometer as often as instructed. Your pain medication is not giving enough relief while using the spirometer. You develop fever of 100.5 F (38.1 C) or higher. SEEK IMMEDIATE MEDICAL CARE IF:  You cough up bloody sputum that had not been present before. You develop fever of 102 F (38.9 C) or greater. You develop worsening pain at or near the incision site. MAKE SURE YOU:  Understand these instructions. Will watch your condition. Will get help right away if you are not doing well or get worse. Document Released: 04/22/2007 Document Revised: 03/03/2012 Document Reviewed: 06/23/2007   WHAT IS A BLOOD TRANSFUSION? Blood Transfusion Information  A transfusion is the replacement of blood or some of its parts. Blood is made up of multiple cells which provide different functions. Red blood cells carry oxygen and are used for blood loss replacement. White blood cells fight against infection. Platelets control bleeding. Plasma helps clot blood. Other blood products are available for specialized needs, such as hemophilia or other clotting disorders. BEFORE THE TRANSFUSION  Who gives blood for transfusions?  Healthy volunteers who are fully evaluated to make sure their  blood is safe. This is blood bank blood. Transfusion therapy is the safest it has ever been in the practice of medicine. Before blood is taken from a donor, a complete history is taken to make sure that person has no history of diseases nor engages in risky social behavior (examples are intravenous drug use or sexual activity with multiple partners). The donor's travel history is screened to minimize risk of transmitting infections, such as malaria. The donated blood is tested for signs of infectious diseases, such as HIV and hepatitis. The blood is then tested to be sure it is compatible with you in order to minimize the chance of  a transfusion reaction. If you or a relative donates blood, this is often done in anticipation of surgery and is not appropriate for emergency situations. It takes many days to process the donated blood. RISKS AND COMPLICATIONS Although transfusion therapy is very safe and saves many lives, the main dangers of transfusion include:  Getting an infectious disease. Developing a transfusion reaction. This is an allergic reaction to something in the blood you were given. Every precaution is taken to prevent this. The decision to have a blood transfusion has been considered carefully by your caregiver before blood is given. Blood is not given unless the benefits outweigh the risks. AFTER THE TRANSFUSION Right after receiving a blood transfusion, you will usually feel much better and more energetic. This is especially true if your red blood cells have gotten low (anemic). The transfusion raises the level of the red blood cells which carry oxygen, and this usually causes an energy increase. The nurse administering the transfusion will monitor you carefully for complications. HOME CARE INSTRUCTIONS  No special instructions are needed after a transfusion. You may find your energy is better. Speak with your caregiver about any limitations on activity for underlying diseases you may  have. SEEK MEDICAL CARE IF:  Your condition is not improving after your transfusion. You develop redness or irritation at the intravenous (IV) site. SEEK IMMEDIATE MEDICAL CARE IF:  Any of the following symptoms occur over the next 12 hours: Shaking chills. You have a temperature by mouth above 102 F (38.9 C), not controlled by medicine. Chest, back, or muscle pain. People around you feel you are not acting correctly or are confused. Shortness of breath or difficulty breathing. Dizziness and fainting. You get a rash or develop hives. You have a decrease in urine output. Your urine turns a dark color or changes to pink, red, or brown. Any of the following symptoms occur over the next 10 days: You have a temperature by mouth above 102 F (38.9 C), not controlled by medicine. Shortness of breath. Weakness after normal activity. The white part of the eye turns yellow (jaundice). You have a decrease in the amount of urine or are urinating less often. Your urine turns a dark color or changes to pink, red, or brown. Document Released: 12/07/2000 Document Revised: 03/03/2012 Document Reviewed: 07/26/2008 [facility] Patient Information 2014 [place], [place].

## 2024-11-26 ENCOUNTER — Encounter (HOSPITAL_COMMUNITY): Payer: Self-pay

## 2024-11-26 ENCOUNTER — Encounter (HOSPITAL_COMMUNITY)
Admission: RE | Admit: 2024-11-26 | Discharge: 2024-11-26 | Disposition: A | Source: Ambulatory Visit | Attending: Orthopaedic Surgery

## 2024-11-26 ENCOUNTER — Other Ambulatory Visit: Payer: Self-pay

## 2024-11-26 VITALS — BP 140/78 | HR 93 | Temp 97.7°F | Resp 18 | Ht 71.5 in | Wt 287.0 lb

## 2024-11-26 DIAGNOSIS — T84093A Other mechanical complication of internal left knee prosthesis, initial encounter: Secondary | ICD-10-CM | POA: Diagnosis not present

## 2024-11-26 DIAGNOSIS — G4733 Obstructive sleep apnea (adult) (pediatric): Secondary | ICD-10-CM | POA: Diagnosis not present

## 2024-11-26 DIAGNOSIS — J4489 Other specified chronic obstructive pulmonary disease: Secondary | ICD-10-CM | POA: Diagnosis not present

## 2024-11-26 DIAGNOSIS — I2510 Atherosclerotic heart disease of native coronary artery without angina pectoris: Secondary | ICD-10-CM | POA: Diagnosis not present

## 2024-11-26 DIAGNOSIS — Z01812 Encounter for preprocedural laboratory examination: Secondary | ICD-10-CM | POA: Diagnosis not present

## 2024-11-26 DIAGNOSIS — Z01818 Encounter for other preprocedural examination: Secondary | ICD-10-CM

## 2024-11-26 DIAGNOSIS — I10 Essential (primary) hypertension: Secondary | ICD-10-CM | POA: Diagnosis not present

## 2024-11-26 DIAGNOSIS — Z7902 Long term (current) use of antithrombotics/antiplatelets: Secondary | ICD-10-CM | POA: Diagnosis not present

## 2024-11-26 DIAGNOSIS — Z955 Presence of coronary angioplasty implant and graft: Secondary | ICD-10-CM | POA: Diagnosis not present

## 2024-11-26 HISTORY — DX: Malignant (primary) neoplasm, unspecified: C80.1

## 2024-11-26 LAB — CBC
HCT: 48.3 % (ref 39.0–52.0)
Hemoglobin: 16.4 g/dL (ref 13.0–17.0)
MCH: 30.5 pg (ref 26.0–34.0)
MCHC: 34 g/dL (ref 30.0–36.0)
MCV: 89.9 fL (ref 80.0–100.0)
Platelets: 187 K/uL (ref 150–400)
RBC: 5.37 MIL/uL (ref 4.22–5.81)
RDW: 13.5 % (ref 11.5–15.5)
WBC: 7.3 K/uL (ref 4.0–10.5)
nRBC: 0 % (ref 0.0–0.2)

## 2024-11-26 LAB — COMPREHENSIVE METABOLIC PANEL WITH GFR
ALT: 21 U/L (ref 0–44)
AST: 31 U/L (ref 15–41)
Albumin: 4.4 g/dL (ref 3.5–5.0)
Alkaline Phosphatase: 76 U/L (ref 38–126)
Anion gap: 9 (ref 5–15)
BUN: 18 mg/dL (ref 8–23)
CO2: 24 mmol/L (ref 22–32)
Calcium: 9.9 mg/dL (ref 8.9–10.3)
Chloride: 101 mmol/L (ref 98–111)
Creatinine, Ser: 0.97 mg/dL (ref 0.61–1.24)
GFR, Estimated: >60 mL/min (ref >60)
Glucose, Bld: 83 mg/dL (ref 70–99)
Potassium: 4.8 mmol/L (ref 3.5–5.1)
Sodium: 134 mmol/L — ABNORMAL LOW (ref 135–145)
Total Bilirubin: 0.7 mg/dL (ref 0.0–1.2)
Total Protein: 7.5 g/dL (ref 6.5–8.1)

## 2024-11-26 LAB — SURGICAL PCR SCREEN
MRSA, PCR: NEGATIVE
Staphylococcus aureus: NEGATIVE

## 2024-11-29 ENCOUNTER — Ambulatory Visit (HOSPITAL_BASED_OUTPATIENT_CLINIC_OR_DEPARTMENT_OTHER): Attending: Pulmonary Disease | Admitting: Pulmonary Disease

## 2024-11-29 DIAGNOSIS — G4731 Primary central sleep apnea: Secondary | ICD-10-CM

## 2024-11-29 NOTE — Progress Notes (Unsigned)
 SABRA

## 2024-11-30 NOTE — Progress Notes (Signed)
 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 never smoker with h/o sleep apnea on BiPAP, HTN, COPD, CAD s/p PCI with DES to LCx in 2017, left failed total knee arthroplasty scheduled for above procedure 12/04/24 with Dr. Lonni Vernetta.   Per cardiology preoperative evaluation 11/10/2024, Chart reviewed as part of pre-operative protocol coverage. Brad Woods was last seen on 10/05/24 by Dr. Kriste.    Per Dr. Kriste: He has had chronic dyspnea which has not changed over time and is stable for years. He had an echo which was grossly unremarkable. 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    Despite the low RCRI of 1 (CAD) he does have significant multivessel disease. As long as his symptoms done change he is considered intermediate perioperative cardiac risk for the anticipated intermediate risk procedure. No further cardiac workup needed unless his symptoms change in quality. Plan was also discussed with Dr. Mady with interventional cardiology who also looked at the images/films and agrees with this plan. Ideally he would continue on Plavix  but if he needs to stop for the surgery then I would defer to the surgeons and restart as soon as possible.    Therefore, based on ACC/AHA guidelines, the patient would be at acceptable risk for the planned procedure without further  cardiovascular testing.  Last dose of Plavix  11/28/2024.  VS: BP (!) 140/78   Pulse 93   Temp 36.5 C (Oral)   Resp 18   Ht 5' 11.5 (1.816 m)   Wt 130.2 kg   SpO2 96%   BMI 39.47 kg/m   PROVIDERS: Nanci Senior, MD is PCP   Cardiologist - Emeline Kriste DO  LABS: Labs reviewed: Acceptable for surgery. (all labs ordered are listed, but only abnormal results are displayed)  Labs Reviewed  COMPREHENSIVE METABOLIC PANEL WITH GFR - Abnormal; Notable for the following components:      Result Value   Sodium 134 (*)    All other components within normal limits  SURGICAL PCR SCREEN  CBC  TYPE AND SCREEN     IMAGES:   EKG:   CV: Echo 11/05/24 1. Left ventricular ejection fraction, by estimation, is 60 to 65%. The  left ventricle has normal function. The left ventricle has no regional  wall motion abnormalities. There is mild left ventricular hypertrophy.  Left ventricular diastolic parameters  are consistent with Grade I diastolic dysfunction (impaired relaxation).   2. Right ventricular systolic function is normal. The right ventricular  size is normal.   3. The mitral valve is normal in structure. Trivial mitral valve  regurgitation.   4. The aortic valve is tricuspid. Aortic valve regurgitation is trivial.  No aortic stenosis is present.   5. The inferior vena cava is normal in size with greater than 50%  respiratory variability, suggesting right atrial pressure of 3 mmHg.  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    Past  Surgical History:  Procedure Laterality Date   BICEPT TENODESIS Right 10/22/2023   Procedure: BICEPS TENODESIS;  Surgeon: Addie Cordella Hamilton, MD;  Location: [facility] OR;  Service: Orthopedics;  Laterality: Right;   CARDIAC CATHETERIZATION N/A 05/20/2016   Procedure: Left Heart Cath and Coronary Angiography;  Surgeon: Dorn JINNY Lesches, MD;  Location: [facility] INVASIVE CV LAB;  Service: Cardiovascular;  Laterality: N/A;   CARDIAC CATHETERIZATION N/A 11/22/2016   Procedure: Left Heart Cath and Coronary Angiography;  Surgeon: Lonni Hanson, MD;  Location: [facility] INVASIVE CV LAB;  Service: Cardiovascular;  Laterality: N/A;   CARPAL TUNNEL RELEASE Right    CHOLECYSTECTOMY N/A 03/10/2024   Procedure: LAPAROSCOPIC CHOLECYSTECTOMY;  Surgeon: Kinsinger, Herlene Righter, MD;  Location: [facility] OR;  Service: General;  Laterality: N/A;   COLONOSCOPY     EYE SURGERY Left    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, 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   INGUINAL HERNIA REPAIR  1955   LEFT HEART CATH AND CORONARY ANGIOGRAPHY N/A 05/31/2020   Procedure: LEFT HEART CATH AND CORONARY ANGIOGRAPHY;  Surgeon: Burnard Debby LABOR, MD;  Location: [facility] INVASIVE CV LAB;  Service: Cardiovascular;  Laterality: N/A;   RECTAL SURGERY     polyps and fissures removed   REPLACEMENT TOTAL KNEE Bilateral    REVERSE SHOULDER ARTHROPLASTY Right 10/22/2023   Procedure: RIGHT REVERSE SHOULDER ARTHROPLASTY;  Surgeon: Addie Cordella Hamilton, MD;  Location: [facility] OR;  Service: Orthopedics;  Laterality: Right;   TONSILLECTOMY     TRIGGER FINGER RELEASE Right 04/24/2024   Procedure: RIGHT SMALL FINGER RELEASE TRIGGER FINGER/A-1 PULLEY;  Surgeon: Arlinda Buster, MD;  Location: [facility];  Service: Orthopedics;  Laterality: Right;    MEDICATIONS:  albuterol  (VENTOLIN  HFA) 108 (90 Base) MCG/ACT inhaler   carvedilol  (COREG ) 12.5 MG tablet   clopidogrel  (PLAVIX ) 75 MG tablet   Evolocumab  (REPATHA  SURECLICK) 140 MG/ML SOAJ   finasteride  (PROSCAR ) 5 MG  tablet   fluticasone (FLONASE) 50 MCG/ACT nasal spray   Fluticasone-Umeclidin-Vilant (TRELEGY ELLIPTA ) 200-62.5-25 MCG/ACT AEPB   ibuprofen (ADVIL) 200 MG tablet   isosorbide  mononitrate (IMDUR ) 60 MG 24 hr tablet   Multiple Vitamin (MULTIVITAMIN WITH MINERALS) TABS tablet   nitroGLYCERIN  (NITROSTAT ) 0.4 MG SL tablet   tamsulosin  (FLOMAX ) 0.4 MG CAPS capsule   No current facility-administered medications for this encounter.      Harlene Hoots Ward, PA-C [facility] Pre-Surgical Testing [phone]

## 2024-11-30 NOTE — Anesthesia Preprocedure Evaluation (Signed)
 Anesthesia Evaluation    Airway        Dental   Pulmonary           Cardiovascular hypertension,      Neuro/Psych    GI/Hepatic   Endo/Other    Renal/GU      Musculoskeletal   Abdominal   Peds  Hematology   Anesthesia Other Findings   Reproductive/Obstetrics                              Anesthesia Physical Anesthesia Plan  ASA:   Anesthesia Plan:    Post-op Pain Management:    Induction:   PONV Risk Score and Plan:   Airway Management Planned:   Additional Equipment:   Intra-op Plan:   Post-operative Plan:   Informed Consent:   Plan Discussed with:   Anesthesia Plan Comments: (See PAT note 11/26/2024)        Anesthesia Quick Evaluation

## 2024-12-03 ENCOUNTER — Encounter (HOSPITAL_COMMUNITY): Payer: Self-pay | Admitting: Orthopaedic Surgery

## 2024-12-03 DIAGNOSIS — T84093A Other mechanical complication of internal left knee prosthesis, initial encounter: Secondary | ICD-10-CM

## 2024-12-03 HISTORY — DX: Other mechanical complication of internal left knee prosthesis, initial encounter: T84.093A

## 2024-12-03 NOTE — H&P (Signed)
 Brad Woods is an 78 y.o. male.   Chief Complaint: unstable left total knee HPI: The patient is a 78 year old gentleman who has a history of bilateral knee replacements done elsewhere around 2014.  He is very active.  We have seen him before for laxity in his total knee replacements.  The left knee has a positive drawer sign.  The plain films show that the components remain intact and it does not look like there is evidence of loosening of the components but on exam there is significant laxity in his left knee to the point this is affecting his mobility and the patient notes the significant instability of the knee.  We have recommended a left knee open arthrotomy with assessing the components and upsizing the poly liner to stabilize in the.  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    Past Surgical History:  Procedure Laterality Date   BICEPT TENODESIS Right 10/22/2023   Procedure: BICEPS TENODESIS;  Surgeon: Addie Cordella Hamilton, MD;  Location: [facility] OR;  Service: Orthopedics;  Laterality: Right;   CARDIAC CATHETERIZATION N/A 05/20/2016   Procedure: Left Heart Cath and Coronary Angiography;  Surgeon: Dorn JINNY Lesches, MD;  Location: [facility] INVASIVE CV LAB;  Service: Cardiovascular;  Laterality: N/A;   CARDIAC CATHETERIZATION N/A 11/22/2016   Procedure: Left Heart Cath and Coronary Angiography;  Surgeon: Brad Hanson, MD;  Location: [facility] INVASIVE CV LAB;  Service: Cardiovascular;  Laterality: N/A;   CARPAL  TUNNEL RELEASE Right    CHOLECYSTECTOMY N/A 03/10/2024   Procedure: LAPAROSCOPIC CHOLECYSTECTOMY;  Surgeon: Kinsinger, Herlene Righter, MD;  Location: [facility] OR;  Service: General;  Laterality: N/A;   COLONOSCOPY     EYE SURGERY Left    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, 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   INGUINAL HERNIA REPAIR  1955   LEFT HEART CATH AND CORONARY ANGIOGRAPHY N/A 05/31/2020   Procedure: LEFT HEART CATH AND CORONARY ANGIOGRAPHY;  Surgeon: Burnard Debby LABOR, MD;  Location: [facility] INVASIVE CV LAB;  Service: Cardiovascular;  Laterality: N/A;   RECTAL SURGERY     polyps and fissures removed   REPLACEMENT TOTAL KNEE Bilateral    REVERSE SHOULDER ARTHROPLASTY Right 10/22/2023   Procedure: RIGHT REVERSE SHOULDER ARTHROPLASTY;  Surgeon: Addie Cordella Hamilton, MD;  Location: [facility] OR;  Service: Orthopedics;  Laterality: Right;   TONSILLECTOMY     TRIGGER FINGER RELEASE Right 04/24/2024   Procedure: RIGHT SMALL FINGER RELEASE TRIGGER FINGER/A-1 PULLEY;  Surgeon: Arlinda Buster, MD;  Location: [facility];  Service: Orthopedics;  Laterality: Right;    Family History  Problem Relation Age of Onset   Colon cancer Mother 22   Social History:  reports that he has never smoked. He has never used smokeless tobacco. He reports current alcohol use. He reports that he does not use drugs.  Allergies: Allergies[1]  No medications prior to admission.    No results found for this or any previous visit (from the past 48 hours). No results found.  Review of Systems  There were no vitals taken for this visit. Physical Exam Vitals reviewed.  Constitutional:      Appearance: Normal appearance. He is obese.  HENT:     Head: Normocephalic and atraumatic.  Eyes:     Extraocular Movements: Extraocular movements intact.     Pupils: Pupils are equal, round, and reactive to light.  Cardiovascular:     Rate and Rhythm: Normal rate and regular rhythm.  Pulmonary:     Effort: Pulmonary effort  is normal.     Breath sounds: Normal breath sounds.  Abdominal:     Palpations: Abdomen is soft.  Musculoskeletal:     Cervical back: Normal range of motion and neck supple.     Left knee: Effusion present. ACL laxity and PCL laxity present.  Neurological:     Mental Status: He is alert and oriented to person, place, and time.  Psychiatric:        Behavior: Behavior normal.      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  The risks and benefits of the surgery have been discussed in significant detail.  Brad CINDERELLA Poli, MD 12/03/2024, 11:00 AM       [1]  Allergies Allergen Reactions   Hydromorphone Hcl Other (See Comments)    flatlined - Patient states he was overdosed. He states he isn't allergic.   Hydromorphone     Cardiac arrest   Aleve [Naproxen Sodium] Dermatitis    States he was given aleve in the hospital and it caused welts.

## 2024-12-04 ENCOUNTER — Inpatient Hospital Stay (HOSPITAL_COMMUNITY): Admitting: Certified Registered Nurse Anesthetist

## 2024-12-04 ENCOUNTER — Inpatient Hospital Stay (HOSPITAL_COMMUNITY)

## 2024-12-04 ENCOUNTER — Other Ambulatory Visit: Payer: Self-pay

## 2024-12-04 ENCOUNTER — Inpatient Hospital Stay (HOSPITAL_COMMUNITY)
Admission: RE | Admit: 2024-12-04 | Discharge: 2024-12-05 | DRG: 489 | Disposition: A | Attending: Orthopaedic Surgery | Admitting: Orthopaedic Surgery

## 2024-12-04 ENCOUNTER — Encounter (HOSPITAL_COMMUNITY): Payer: Self-pay | Admitting: Orthopaedic Surgery

## 2024-12-04 ENCOUNTER — Encounter: Admission: RE | Disposition: A | Payer: Self-pay | Attending: Orthopaedic Surgery

## 2024-12-04 ENCOUNTER — Inpatient Hospital Stay (HOSPITAL_COMMUNITY): Payer: Self-pay | Admitting: Medical

## 2024-12-04 DIAGNOSIS — Z96652 Presence of left artificial knee joint: Secondary | ICD-10-CM

## 2024-12-04 DIAGNOSIS — I10 Essential (primary) hypertension: Secondary | ICD-10-CM | POA: Diagnosis present

## 2024-12-04 DIAGNOSIS — Z96611 Presence of right artificial shoulder joint: Secondary | ICD-10-CM | POA: Diagnosis present

## 2024-12-04 DIAGNOSIS — E039 Hypothyroidism, unspecified: Secondary | ICD-10-CM | POA: Diagnosis present

## 2024-12-04 DIAGNOSIS — J4489 Other specified chronic obstructive pulmonary disease: Secondary | ICD-10-CM | POA: Diagnosis present

## 2024-12-04 DIAGNOSIS — T84093D Other mechanical complication of internal left knee prosthesis, subsequent encounter: Principal | ICD-10-CM

## 2024-12-04 DIAGNOSIS — Z87442 Personal history of urinary calculi: Secondary | ICD-10-CM | POA: Diagnosis not present

## 2024-12-04 DIAGNOSIS — Z885 Allergy status to narcotic agent status: Secondary | ICD-10-CM | POA: Diagnosis not present

## 2024-12-04 DIAGNOSIS — G4733 Obstructive sleep apnea (adult) (pediatric): Secondary | ICD-10-CM | POA: Diagnosis present

## 2024-12-04 DIAGNOSIS — G4739 Other sleep apnea: Secondary | ICD-10-CM | POA: Diagnosis not present

## 2024-12-04 DIAGNOSIS — E785 Hyperlipidemia, unspecified: Secondary | ICD-10-CM | POA: Diagnosis present

## 2024-12-04 DIAGNOSIS — I739 Peripheral vascular disease, unspecified: Secondary | ICD-10-CM | POA: Diagnosis present

## 2024-12-04 DIAGNOSIS — T84018A Broken internal joint prosthesis, other site, initial encounter: Secondary | ICD-10-CM | POA: Diagnosis present

## 2024-12-04 DIAGNOSIS — T84023A Instability of internal left knee prosthesis, initial encounter: Secondary | ICD-10-CM | POA: Diagnosis present

## 2024-12-04 DIAGNOSIS — K219 Gastro-esophageal reflux disease without esophagitis: Secondary | ICD-10-CM | POA: Diagnosis present

## 2024-12-04 DIAGNOSIS — M65962 Unspecified synovitis and tenosynovitis, left lower leg: Secondary | ICD-10-CM | POA: Diagnosis present

## 2024-12-04 DIAGNOSIS — I2510 Atherosclerotic heart disease of native coronary artery without angina pectoris: Secondary | ICD-10-CM | POA: Diagnosis present

## 2024-12-04 DIAGNOSIS — Z6838 Body mass index (BMI) 38.0-38.9, adult: Secondary | ICD-10-CM | POA: Diagnosis not present

## 2024-12-04 DIAGNOSIS — Z955 Presence of coronary angioplasty implant and graft: Secondary | ICD-10-CM | POA: Diagnosis not present

## 2024-12-04 DIAGNOSIS — Z85828 Personal history of other malignant neoplasm of skin: Secondary | ICD-10-CM | POA: Diagnosis not present

## 2024-12-04 DIAGNOSIS — Z9049 Acquired absence of other specified parts of digestive tract: Secondary | ICD-10-CM | POA: Diagnosis not present

## 2024-12-04 DIAGNOSIS — Z888 Allergy status to other drugs, medicaments and biological substances status: Secondary | ICD-10-CM | POA: Diagnosis not present

## 2024-12-04 DIAGNOSIS — I25119 Atherosclerotic heart disease of native coronary artery with unspecified angina pectoris: Secondary | ICD-10-CM | POA: Diagnosis not present

## 2024-12-04 DIAGNOSIS — Z96651 Presence of right artificial knee joint: Secondary | ICD-10-CM | POA: Diagnosis present

## 2024-12-04 DIAGNOSIS — Y792 Prosthetic and other implants, materials and accessory orthopedic devices associated with adverse incidents: Secondary | ICD-10-CM | POA: Diagnosis present

## 2024-12-04 DIAGNOSIS — Z800 Family history of malignant neoplasm of digestive organs: Secondary | ICD-10-CM | POA: Diagnosis not present

## 2024-12-04 DIAGNOSIS — I252 Old myocardial infarction: Secondary | ICD-10-CM | POA: Diagnosis not present

## 2024-12-04 DIAGNOSIS — E669 Obesity, unspecified: Secondary | ICD-10-CM | POA: Diagnosis present

## 2024-12-04 DIAGNOSIS — T84093A Other mechanical complication of internal left knee prosthesis, initial encounter: Secondary | ICD-10-CM | POA: Diagnosis not present

## 2024-12-04 LAB — ABO/RH: ABO/RH(D): O POS

## 2024-12-04 LAB — TYPE AND SCREEN
ABO/RH(D): O POS
Antibody Screen: NEGATIVE

## 2024-12-04 SURGERY — IRRIGATION AND DEBRIDEMENT KNEE WITH POLY EXCHANGE
Anesthesia: General | Site: Knee | Laterality: Left

## 2024-12-04 MED ORDER — DIPHENHYDRAMINE HCL 12.5 MG/5ML PO ELIX: 12.5000 mg | ORAL_SOLUTION | ORAL | Status: DC | PRN

## 2024-12-04 MED ORDER — ISOSORBIDE MONONITRATE ER 60 MG PO TB24
60.0000 mg | ORAL_TABLET | Freq: Every day | ORAL | Status: DC
  Administered 2024-12-05: 60 mg via ORAL
  Filled 2024-12-04: qty 1

## 2024-12-04 MED ORDER — CEFAZOLIN SODIUM-DEXTROSE 2-4 GM/100ML-% IV SOLN
2.0000 g | Freq: Four times a day (QID) | INTRAVENOUS | Status: AC
  Administered 2024-12-04 – 2024-12-05 (×2): 2 g via INTRAVENOUS
  Filled 2024-12-04 (×2): qty 100

## 2024-12-04 MED ORDER — OXYCODONE HCL 5 MG PO TABS
5.0000 mg | ORAL_TABLET | ORAL | Status: DC | PRN
  Administered 2024-12-04 – 2024-12-05 (×3): 10 mg via ORAL
  Filled 2024-12-04 (×3): qty 2

## 2024-12-04 MED ORDER — POVIDONE-IODINE 10 % EX SWAB
2.0000 | Freq: Once | CUTANEOUS | Status: AC
  Administered 2024-12-04: 2 via TOPICAL

## 2024-12-04 MED ORDER — FENTANYL CITRATE (PF) 50 MCG/ML IJ SOSY
PREFILLED_SYRINGE | INTRAMUSCULAR | Status: AC
  Filled 2024-12-04: qty 1

## 2024-12-04 MED ORDER — ALBUTEROL SULFATE (2.5 MG/3ML) 0.083% IN NEBU: 2.5000 mg | INHALATION_SOLUTION | Freq: Four times a day (QID) | RESPIRATORY_TRACT | Status: DC | PRN

## 2024-12-04 MED ORDER — ROPIVACAINE HCL 5 MG/ML IJ SOLN
INTRAMUSCULAR | Status: DC | PRN
  Administered 2024-12-04: 20 mL via PERINEURAL

## 2024-12-04 MED ORDER — ACETAMINOPHEN 500 MG PO TABS
1000.0000 mg | ORAL_TABLET | Freq: Once | ORAL | Status: AC
  Administered 2024-12-04: 1000 mg via ORAL
  Filled 2024-12-04: qty 2

## 2024-12-04 MED ORDER — CARVEDILOL 12.5 MG PO TABS
12.5000 mg | ORAL_TABLET | Freq: Two times a day (BID) | ORAL | Status: DC
  Administered 2024-12-04 – 2024-12-05 (×2): 12.5 mg via ORAL
  Filled 2024-12-04 (×2): qty 1

## 2024-12-04 MED ORDER — ZOLPIDEM TARTRATE 5 MG PO TABS: 5.0000 mg | ORAL_TABLET | Freq: Every evening | ORAL | Status: DC | PRN

## 2024-12-04 MED ORDER — LIDOCAINE HCL (PF) 2 % IJ SOLN
INTRAMUSCULAR | Status: AC
  Filled 2024-12-04: qty 5

## 2024-12-04 MED ORDER — CHLORHEXIDINE GLUCONATE 0.12 % MT SOLN
15.0000 mL | Freq: Once | OROMUCOSAL | Status: AC
  Administered 2024-12-04: 15 mL via OROMUCOSAL

## 2024-12-04 MED ORDER — ONDANSETRON HCL 4 MG/2ML IJ SOLN: 4.0000 mg | Freq: Four times a day (QID) | INTRAMUSCULAR | Status: DC | PRN

## 2024-12-04 MED ORDER — FENTANYL CITRATE (PF) 50 MCG/ML IJ SOSY
PREFILLED_SYRINGE | INTRAMUSCULAR | Status: AC
  Filled 2024-12-04: qty 2

## 2024-12-04 MED ORDER — FENTANYL CITRATE (PF) 100 MCG/2ML IJ SOLN
INTRAMUSCULAR | Status: AC
  Filled 2024-12-04: qty 2

## 2024-12-04 MED ORDER — PHENYLEPHRINE 80 MCG/ML (10ML) SYRINGE FOR IV PUSH (FOR BLOOD PRESSURE SUPPORT)
PREFILLED_SYRINGE | INTRAVENOUS | Status: AC
  Filled 2024-12-04: qty 10

## 2024-12-04 MED ORDER — PHENYLEPHRINE HCL-NACL 20-0.9 MG/250ML-% IV SOLN
INTRAVENOUS | Status: DC | PRN
  Administered 2024-12-04: 40 ug/min via INTRAVENOUS

## 2024-12-04 MED ORDER — MENTHOL 3 MG MT LOZG: 1.0000 | LOZENGE | OROMUCOSAL | Status: DC | PRN

## 2024-12-04 MED ORDER — DEXAMETHASONE SOD PHOSPHATE PF 10 MG/ML IJ SOLN
INTRAMUSCULAR | Status: DC | PRN
  Administered 2024-12-04: 10 mg via INTRAVENOUS

## 2024-12-04 MED ORDER — ASPIRIN 81 MG PO CHEW
81.0000 mg | CHEWABLE_TABLET | Freq: Two times a day (BID) | ORAL | Status: DC
  Administered 2024-12-04 – 2024-12-05 (×2): 81 mg via ORAL
  Filled 2024-12-04 (×2): qty 1

## 2024-12-04 MED ORDER — PHENYLEPHRINE HCL-NACL 20-0.9 MG/250ML-% IV SOLN
INTRAVENOUS | Status: AC
  Filled 2024-12-04: qty 250

## 2024-12-04 MED ORDER — BUDESON-GLYCOPYRROL-FORMOTEROL 160-9-4.8 MCG/ACT IN AERO
2.0000 | INHALATION_SPRAY | Freq: Two times a day (BID) | RESPIRATORY_TRACT | Status: DC
  Administered 2024-12-04 – 2024-12-05 (×2): 2 via RESPIRATORY_TRACT
  Filled 2024-12-04: qty 5.9

## 2024-12-04 MED ORDER — OXYCODONE HCL 5 MG PO TABS: 10.0000 mg | ORAL_TABLET | ORAL | Status: DC | PRN

## 2024-12-04 MED ORDER — ONDANSETRON HCL 4 MG/2ML IJ SOLN
INTRAMUSCULAR | Status: DC | PRN
  Administered 2024-12-04: 4 mg via INTRAVENOUS

## 2024-12-04 MED ORDER — MIDAZOLAM HCL (PF) 2 MG/2ML IJ SOLN
1.0000 mg | Freq: Once | INTRAMUSCULAR | Status: DC
  Filled 2024-12-04: qty 2

## 2024-12-04 MED ORDER — CLONIDINE HCL (ANALGESIA) 100 MCG/ML EP SOLN
EPIDURAL | Status: DC | PRN
  Administered 2024-12-04: 50 ug

## 2024-12-04 MED ORDER — ACETAMINOPHEN 325 MG PO TABS
325.0000 mg | ORAL_TABLET | Freq: Four times a day (QID) | ORAL | Status: DC | PRN
  Filled 2024-12-04: qty 2

## 2024-12-04 MED ORDER — BUPIVACAINE-EPINEPHRINE (PF) 0.25% -1:200000 IJ SOLN
INTRAMUSCULAR | Status: DC | PRN
  Administered 2024-12-04: 30 mL via PERINEURAL

## 2024-12-04 MED ORDER — ALUM & MAG HYDROXIDE-SIMETH 200-200-20 MG/5ML PO SUSP: 30.0000 mL | ORAL | Status: DC | PRN

## 2024-12-04 MED ORDER — METOCLOPRAMIDE HCL 5 MG/ML IJ SOLN: 5.0000 mg | Freq: Three times a day (TID) | INTRAMUSCULAR | Status: DC | PRN

## 2024-12-04 MED ORDER — STERILE WATER FOR IRRIGATION IR SOLN
Status: DC | PRN
  Administered 2024-12-04: 2000 mL

## 2024-12-04 MED ORDER — PROPOFOL 10 MG/ML IV BOLUS
INTRAVENOUS | Status: DC | PRN
  Administered 2024-12-04: 200 mg via INTRAVENOUS

## 2024-12-04 MED ORDER — DOCUSATE SODIUM 100 MG PO CAPS
100.0000 mg | ORAL_CAPSULE | Freq: Two times a day (BID) | ORAL | Status: DC
  Administered 2024-12-04 – 2024-12-05 (×2): 100 mg via ORAL
  Filled 2024-12-04 (×2): qty 1

## 2024-12-04 MED ORDER — BUPIVACAINE-EPINEPHRINE (PF) 0.25% -1:200000 IJ SOLN
INTRAMUSCULAR | Status: AC
  Filled 2024-12-04: qty 30

## 2024-12-04 MED ORDER — METHOCARBAMOL 500 MG PO TABS
500.0000 mg | ORAL_TABLET | Freq: Four times a day (QID) | ORAL | Status: DC | PRN
  Administered 2024-12-04 – 2024-12-05 (×2): 500 mg via ORAL
  Filled 2024-12-04 (×2): qty 1

## 2024-12-04 MED ORDER — PHENOL 1.4 % MT LIQD: 1.0000 | OROMUCOSAL | Status: DC | PRN

## 2024-12-04 MED ORDER — FENTANYL CITRATE (PF) 50 MCG/ML IJ SOSY
25.0000 ug | PREFILLED_SYRINGE | INTRAMUSCULAR | Status: DC | PRN
  Administered 2024-12-04 (×2): 50 ug via INTRAVENOUS

## 2024-12-04 MED ORDER — PROPOFOL 1000 MG/100ML IV EMUL
INTRAVENOUS | Status: AC
  Filled 2024-12-04: qty 100

## 2024-12-04 MED ORDER — FENTANYL CITRATE (PF) 250 MCG/5ML IJ SOLN
INTRAMUSCULAR | Status: DC | PRN
  Administered 2024-12-04 (×2): 50 ug via INTRAVENOUS
  Administered 2024-12-04 (×4): 25 ug via INTRAVENOUS

## 2024-12-04 MED ORDER — TRANEXAMIC ACID-NACL 1000-0.7 MG/100ML-% IV SOLN
1000.0000 mg | INTRAVENOUS | Status: AC
  Administered 2024-12-04: 1000 mg via INTRAVENOUS
  Filled 2024-12-04: qty 100

## 2024-12-04 MED ORDER — ONDANSETRON HCL 4 MG/2ML IJ SOLN
INTRAMUSCULAR | Status: AC
  Filled 2024-12-04: qty 2

## 2024-12-04 MED ORDER — CLOPIDOGREL BISULFATE 75 MG PO TABS
75.0000 mg | ORAL_TABLET | Freq: Every day | ORAL | Status: DC
  Administered 2024-12-05: 75 mg via ORAL
  Filled 2024-12-04: qty 1

## 2024-12-04 MED ORDER — SODIUM CHLORIDE 0.9 % IR SOLN
Status: DC | PRN
  Administered 2024-12-04: 3000 mL

## 2024-12-04 MED ORDER — METOCLOPRAMIDE HCL 5 MG PO TABS: 5.0000 mg | ORAL_TABLET | Freq: Three times a day (TID) | ORAL | Status: DC | PRN

## 2024-12-04 MED ORDER — OXYCODONE HCL 5 MG/5ML PO SOLN: 5.0000 mg | Freq: Once | ORAL | Status: AC | PRN

## 2024-12-04 MED ORDER — PROPOFOL 10 MG/ML IV BOLUS
INTRAVENOUS | Status: AC
  Filled 2024-12-04: qty 20

## 2024-12-04 MED ORDER — FENTANYL CITRATE (PF) 50 MCG/ML IJ SOSY
PREFILLED_SYRINGE | INTRAMUSCULAR | Status: AC
  Administered 2024-12-04: 50 ug via INTRAVENOUS
  Filled 2024-12-04: qty 2

## 2024-12-04 MED ORDER — ONDANSETRON HCL 4 MG PO TABS: 4.0000 mg | ORAL_TABLET | Freq: Four times a day (QID) | ORAL | Status: DC | PRN

## 2024-12-04 MED ORDER — ORAL CARE MOUTH RINSE: 15.0000 mL | Freq: Once | OROMUCOSAL | Status: AC

## 2024-12-04 MED ORDER — LACTATED RINGERS IV SOLN: INTRAVENOUS | Status: DC

## 2024-12-04 MED ORDER — CEFAZOLIN SODIUM-DEXTROSE 3-4 GM/150ML-% IV SOLN
3.0000 g | INTRAVENOUS | Status: AC
  Administered 2024-12-04: 3 g via INTRAVENOUS
  Filled 2024-12-04: qty 150

## 2024-12-04 MED ORDER — PANTOPRAZOLE SODIUM 40 MG PO TBEC
40.0000 mg | DELAYED_RELEASE_TABLET | Freq: Every day | ORAL | Status: DC
  Administered 2024-12-04 – 2024-12-05 (×2): 40 mg via ORAL
  Filled 2024-12-04 (×2): qty 1

## 2024-12-04 MED ORDER — MORPHINE SULFATE (PF) 2 MG/ML IV SOLN: 2.0000 mg | INTRAVENOUS | Status: DC | PRN

## 2024-12-04 MED ORDER — LIDOCAINE 2% (20 MG/ML) 5 ML SYRINGE
INTRAMUSCULAR | Status: DC | PRN
  Administered 2024-12-04: 20 mg via INTRAVENOUS

## 2024-12-04 MED ORDER — FLUTICASONE PROPIONATE 50 MCG/ACT NA SUSP: 2.0000 | Freq: Every day | NASAL | Status: DC | PRN

## 2024-12-04 MED ORDER — DROPERIDOL 2.5 MG/ML IJ SOLN: 0.6250 mg | Freq: Once | INTRAMUSCULAR | Status: DC | PRN

## 2024-12-04 MED ORDER — FENTANYL CITRATE (PF) 50 MCG/ML IJ SOSY
25.0000 ug | PREFILLED_SYRINGE | Freq: Once | INTRAMUSCULAR | Status: AC
  Administered 2024-12-04: 50 ug via INTRAVENOUS

## 2024-12-04 MED ORDER — OXYCODONE HCL 5 MG PO TABS
5.0000 mg | ORAL_TABLET | Freq: Once | ORAL | Status: AC | PRN
  Administered 2024-12-04: 5 mg via ORAL

## 2024-12-04 MED ORDER — OXYCODONE HCL 5 MG PO TABS
ORAL_TABLET | ORAL | Status: AC
  Filled 2024-12-04: qty 1

## 2024-12-04 MED ORDER — SODIUM CHLORIDE 0.9 % IV SOLN: INTRAVENOUS | Status: DC

## 2024-12-04 MED ORDER — PHENYLEPHRINE 80 MCG/ML (10ML) SYRINGE FOR IV PUSH (FOR BLOOD PRESSURE SUPPORT)
PREFILLED_SYRINGE | INTRAVENOUS | Status: DC | PRN
  Administered 2024-12-04: 160 ug via INTRAVENOUS

## 2024-12-04 MED ORDER — METHOCARBAMOL 1000 MG/10ML IJ SOLN: 500.0000 mg | Freq: Four times a day (QID) | INTRAMUSCULAR | Status: DC | PRN

## 2024-12-04 MED ORDER — TAMSULOSIN HCL 0.4 MG PO CAPS
0.4000 mg | ORAL_CAPSULE | Freq: Every day | ORAL | Status: DC
  Administered 2024-12-04: 0.4 mg via ORAL
  Filled 2024-12-04: qty 1

## 2024-12-04 MED ADMIN — Sodium Chloride Irrigation Soln 0.9%: 1000 mL | NDC 99999050048

## 2024-12-04 MED ADMIN — Finasteride Tab 5 MG: 5 mg | ORAL | NDC 60687042811

## 2024-12-04 MED FILL — Finasteride Tab 5 MG: 5.0000 mg | ORAL | Qty: 1 | Status: AC

## 2024-12-04 MED FILL — Rocuronium Bromide IV Soln Pref Syr 100 MG/10ML (10 MG/ML): INTRAVENOUS | Qty: 10 | Status: AC

## 2024-12-04 SURGICAL SUPPLY — 42 items
BAG COUNTER SPONGE SURGICOUNT (BAG) IMPLANT
BAG ZIPLOCK 12X15 (MISCELLANEOUS) ×1 IMPLANT
BENZOIN TINCTURE PRP APPL 2/3 (GAUZE/BANDAGES/DRESSINGS) IMPLANT
BNDG ELASTIC 4INX 5YD STR LF (GAUZE/BANDAGES/DRESSINGS) ×1 IMPLANT
BNDG ELASTIC 6INX 5YD STR LF (GAUZE/BANDAGES/DRESSINGS) ×1 IMPLANT
COOLER ICEMAN CLASSIC (MISCELLANEOUS) IMPLANT
COVER SURGICAL LIGHT HANDLE (MISCELLANEOUS) ×1 IMPLANT
CUFF TRNQT CYL 34X4.125X (TOURNIQUET CUFF) ×1 IMPLANT
DRAPE U-SHAPE 47X51 STRL (DRAPES) ×1 IMPLANT
DRSG ADAPTIC 3X8 NADH LF (GAUZE/BANDAGES/DRESSINGS) ×1 IMPLANT
ELECT PENCIL ROCKER SW 15FT (MISCELLANEOUS) IMPLANT
ELECT REM PT RETURN 15FT ADLT (MISCELLANEOUS) ×1 IMPLANT
GAUZE PAD ABD 8X10 STRL (GAUZE/BANDAGES/DRESSINGS) ×2 IMPLANT
GAUZE SPONGE 4X4 12PLY STRL (GAUZE/BANDAGES/DRESSINGS) ×2 IMPLANT
GAUZE XEROFORM 1X8 LF (GAUZE/BANDAGES/DRESSINGS) IMPLANT
GLOVE BIO SURGEON STRL SZ7.5 (GLOVE) ×1 IMPLANT
GLOVE BIOGEL PI IND STRL 8 (GLOVE) ×2 IMPLANT
GLOVE SURG ORTHO 8.0 STRL STRW (GLOVE) ×1 IMPLANT
GOWN STRL REUS W/ TWL XL LVL3 (GOWN DISPOSABLE) ×2 IMPLANT
HOLDER FOLEY CATH W/STRAP (MISCELLANEOUS) IMPLANT
IMMOBILIZER KNEE 20 THIGH 36 (SOFTGOODS) IMPLANT
INSERT XLPE SZ 5-6 CS 13 (Knees) IMPLANT
KIT TURNOVER KIT A (KITS) ×1 IMPLANT
MANIFOLD NEPTUNE II (INSTRUMENTS) IMPLANT
NS IRRIG 1000ML POUR BTL (IV SOLUTION) ×1 IMPLANT
PACK TOTAL KNEE CUSTOM (KITS) ×1 IMPLANT
PAD COLD SHLDR WRAP-ON (PAD) IMPLANT
PADDING CAST COTTON 6X4 STRL (CAST SUPPLIES) IMPLANT
PROTECTOR NERVE ULNAR (MISCELLANEOUS) ×1 IMPLANT
SET HNDPC FAN SPRY TIP SCT (DISPOSABLE) ×1 IMPLANT
SET PAD KNEE POSITIONER (MISCELLANEOUS) IMPLANT
SPIKE FLUID TRANSFER (MISCELLANEOUS) ×1 IMPLANT
STAPLER SKIN PROX 35W (STAPLE) IMPLANT
STRIP CLOSURE SKIN 1/2X4 (GAUZE/BANDAGES/DRESSINGS) ×2 IMPLANT
SUT MNCRL AB 4-0 PS2 18 (SUTURE) ×1 IMPLANT
SUT VIC AB 0 CT1 36 (SUTURE) ×1 IMPLANT
SUT VIC AB 1 CT1 36 (SUTURE) ×3 IMPLANT
SUT VIC AB 2-0 CT1 TAPERPNT 27 (SUTURE) ×3 IMPLANT
SWAB COLLECTION DEVICE MRSA (MISCELLANEOUS) ×1 IMPLANT
SWAB CULTURE ESWAB REG 1ML (MISCELLANEOUS) ×1 IMPLANT
TRAY FOLEY MTR SLVR 16FR STAT (SET/KITS/TRAYS/PACK) ×1 IMPLANT
YANKAUER SUCT BULB TIP NO VENT (SUCTIONS) IMPLANT

## 2024-12-04 NOTE — Interval H&P Note (Signed)
 History and Physical Interval Note: The patient understands that he is here today due to the laxity in his left total knee replacement.  Our goal is to hopefully improve the stability that knee with upsizing his poly liner.  Will make sure that the other components are stable.  There has been no acute or interval changes in medical status.  The risks and benefits of surgery have been discussed in detail and informed consent has been obtained.  The left operative knee has been marked.  12/04/2024 10:18 AM  Brad Woods  has presented today for surgery, with the diagnosis of Left Failed Total Knee Arthroplasty.  The various methods of treatment have been discussed with the patient and family. After consideration of risks, benefits and other options for treatment, the patient has consented to  Procedures with comments: IRRIGATION AND DEBRIDEMENT KNEE WITH POLY EXCHANGE (Left) - LEFT KNEE OPEN ARTHROTOMY WITH LYSIS ADHESIONS AND UPSIZING POLY LINER as a surgical intervention.  The patient's history has been reviewed, patient examined, no change in status, stable for surgery.  I have reviewed the patient's chart and labs.  Questions were answered to the patient's satisfaction.     [name]

## 2024-12-04 NOTE — Op Note (Signed)
 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 Item Serial No. Manufacturer Lot No. LRB No. Used Action  INSERT XLPE SZ 5-6 CS 13 - [id number] Knees INSERT XLPE SZ 5-6 CS 13  [employer] [id number] Left 1 Implanted   Surgeon: Lonni GRADE. Vernetta, MD Assistant: Tory Gaskins, PA-C  Anesthesia: #1 left lower extremity adductor canal block, #2 General, #3 local Tourniquet time: Less than 1 hour EBL: Less than 50 cc Complications: None Antibiotics: IV Ancef   Indications: The patient is a 78 year old gentleman who has a left total knee arthroplasty was performed in 2014.  He has ligamentous laxity that knee with a positive drawer sign.  He has recurrent effusions of the knee and pain.  There has been no evidence of infection or prosthetic loosening in terms of the femoral and tibial components but on clinical exam he is definitely showing wear of the poly liner which is creating recurrent synovitis and pain as well.  We had a long and thorough discussion about an open arthrotomy with synovectomy, assessing the components themselves and then upsizing his poly liner.  We did discuss the risk of infection, acute bolus anemia, nerve and vessel injury, fracture, infection, DVT and implant failure.  He understands our goals are hopefully decreased pain, improved mobility of the knee, less pain and improve stability of the knee.  Procedure description: After informed consent was obtained the appropriate left knee was marked, an adductor canal blocks obtained of the left lower extremity in the holding room.  The patient was then brought to the operating room and placed upon the operating table where general anesthesia was then obtained.  A nonsterile fingers placed on his upper  left thigh and his left thigh, knee, leg and ankle were prepped and draped in DuraPrep and sterile drapes including a sterile stockinette.  A timeout was called and he was identified as correct patient correct left knee.  An Esmarch was then used to wrap the leg and the tourniquet was plated to 300 mm of pressure.  We then made a direct medial incision over the patella and carried this proximally distally over his previous incision.  We dissected down to the knee joint and carried out a medial parapatellar arthrotomy finding a moderate joint effusion and significant synovitis.  There was no evidence infection.  We did perform a synovectomy in the knee and remove scar tissue from about the knee.  We then were able to flex the knee and remove the poly liner.  There was significant poly wear on the medial and lateral surfaces of the poly liner as well as the post.  This was a size 10 thickness poly liner.  We trialed up to a 13 mm thickness liner and that liner gave full stability of the knee with a negative drawer sign and we were good with flexion extension and felt good with varus and valgus stressing.  We then irrigated the knee thoroughly with normal saline solution using pulsatile lavage.  We then opened up the real [facility] PS XL PE high flexion articular insert a size five 6/13 mm thickness.  We placed this without difficulty and again we appreciated much more stability with the knee.  We then let the tourniquet down and hemostasis was obtained with electrocautery.  The arthrotomy was closed with interrupted #  1 Vicryl suture followed by 0 Vicryl goes deep tissue and 2-0 Vicryl close subcutaneous tissue.  The skin was closed with staples.  Well-padded sterile dressing is applied.  The patient was awakened, extubated and taken the recovery room.  Postop leave allow for weightbearing as tolerated with mobility and range of motion of his knee.  He will be admitted as an inpatient.  Tory Gaskins, PA-C did  assist in the entire case and beginning to end and his assistance was crucial and medically necessary for soft tissue management and retraction, helping guide implant placement and a layered closure of the wound.

## 2024-12-04 NOTE — Anesthesia Procedure Notes (Signed)
 Anesthesia Regional Block: Adductor canal block   Pre-Anesthetic Checklist: , timeout performed,  Correct Patient, Correct Site, Correct Laterality,  Correct Procedure, Correct Position, site marked,  Risks and benefits discussed,  Surgical consent,  Pre-op evaluation,  At surgeon's request and post-op pain management  Laterality: Left  Prep: chloraprep       Needles:  Injection technique: Single-shot  Needle Type: Echogenic Needle     Needle Length: 9cm  Needle Gauge: 21     Additional Needles:   Procedures:,,,, ultrasound used (permanent image in chart),,    Narrative:  Start time: 12/04/2024 10:22 AM End time: 12/04/2024 10:27 AM Injection made incrementally with aspirations every 5 mL.  Performed by: Personally  Anesthesiologist: Epifanio Charleston, MD

## 2024-12-04 NOTE — Anesthesia Procedure Notes (Signed)
 Procedure Name: LMA Insertion Date/Time: 12/04/2024 11:53 AM  Performed by: Britania Shreeve, Corean BROCKS, CRNAPre-anesthesia Checklist: Patient identified, Emergency Drugs available, Suction available and Patient being monitored Patient Re-evaluated:Patient Re-evaluated prior to induction Oxygen Delivery Method: Circle system utilized Preoxygenation: Pre-oxygenation with 100% oxygen Induction Type: IV induction Ventilation: Mask ventilation without difficulty LMA: LMA inserted LMA Size: 4.0 Number of attempts: 1 Airway Equipment and Method: Bite block Placement Confirmation: positive ETCO2 Tube secured with: Tape Dental Injury: Teeth and Oropharynx as per pre-operative assessment

## 2024-12-04 NOTE — Evaluation (Signed)
 Physical Therapy Evaluation Patient Details Name: Brad Woods MRN: [id number] DOB: 1946/05/10 Today's Date: 12/04/2024  History of Present Illness  78 yo male presents to therapy s/p L TKA revision-- poly exchange and I and D, due to ligamentous laxity, joint effusion and knee pain with wear on poly liner as well as failure of conservative measures. Pt PMHI includes but is not limited to: arthritis, COPD, CAD s/p stent, kidney stones, HLD, HTN, MI, R UE neuropathy, OSA, reverse R TSA and B TKA.  Clinical Impression    Brad Woods is a 78 y.o. male POD 0 s/p L TKA revision. Patient reports IND with mobility at baseline. Patient is now limited by functional impairments (see PT problem list below) and requires CGA for bed mobility and CGA for transfers. Patient was able to ambulate 55 feet with RW and CGA level of assist. Patient instructed in exercise to facilitate ROM and circulation to manage edema. Patient will benefit from continued skilled PT interventions to address impairments and progress towards PLOF. Acute PT will follow to progress mobility and stair training in preparation for safe discharge home with family support and [facility] services.       If plan is discharge home, recommend the following: A little help with walking and/or transfers;A little help with bathing/dressing/bathroom;Assistance with cooking/housework;Assist for transportation;Help with stairs or ramp for entrance   Can travel by private vehicle        Equipment Recommendations None recommended by PT  Recommendations for Other Services       Functional Status Assessment Patient has had a recent decline in their functional status and demonstrates the ability to make significant improvements in function in a reasonable and predictable amount of time.     Precautions / Restrictions Precautions Precautions: Fall;Knee Restrictions Weight Bearing Restrictions Per Provider Order: Yes LLE Weight Bearing Per Provider  Order: Weight bearing as tolerated      Mobility  Bed Mobility Overal bed mobility: Needs Assistance Bed Mobility: Supine to Sit     Supine to sit: Contact guard, HOB elevated, Used rails     General bed mobility comments: min cues    Transfers Overall transfer level: Needs assistance Equipment used: Rolling walker (2 wheels) Transfers: Sit to/from Stand Sit to Stand: Contact guard assist           General transfer comment: min cues    Ambulation/Gait Ambulation/Gait assistance: Contact guard assist Gait Distance (Feet): 55 Feet Assistive device: Rolling walker (2 wheels) Gait Pattern/deviations: Step-to pattern, Decreased stance time - left, Antalgic, Trunk flexed Gait velocity: decreased     General Gait Details: slight trunk flexion with B UE support at RW to offload L LE in stance phase min cues for posture, RW management and safety  Stairs            Wheelchair Mobility     Tilt Bed    Modified Rankin (Stroke Patients Only)       Balance Overall balance assessment: Needs assistance Sitting-balance support: Single extremity supported Sitting balance-Leahy Scale: Good     Standing balance support: Bilateral upper extremity supported, During functional activity, Reliant on assistive device for balance Standing balance-Leahy Scale: Poor                               Pertinent Vitals/Pain Pain Assessment Pain Assessment: 0-10 Pain Score: 4  Pain Location: L knee and LE Pain Descriptors / Indicators: Aching, Constant,  Discomfort, Grimacing, Operative site guarding Pain Intervention(s): Limited activity within patient's tolerance, Monitored during session, Premedicated before session, Repositioned, Ice applied    Home Living Family/patient expects to be discharged to:: Private residence Living Arrangements: Spouse/significant other Available Help at Discharge: Family Type of Home: House (Daughter will be in [place] until  Christmas) Home Access: Stairs to enter Entrance Stairs-Rails: None Secretary/administrator of Steps: 1   Home Layout: One level Home Equipment: Agricultural Consultant (2 wheels);Crutches (ice man machine) Additional Comments: Spouse has PD and is unable to provide physical assist in home setting    Prior Function Prior Level of Function : Independent/Modified Independent;Driving             Mobility Comments: IND no AD for all ADLs self care tasks and IADLs       Extremity/Trunk Assessment        Lower Extremity Assessment Lower Extremity Assessment: LLE deficits/detail LLE Deficits / Details: ankle DF/PF 5/5; SLR < 10 degree lag LLE Sensation: WNL    Cervical / Trunk Assessment Cervical / Trunk Assessment: Normal  Communication   Communication Communication: No apparent difficulties    Cognition Arousal: Alert Behavior During Therapy: WFL for tasks assessed/performed   PT - Cognitive impairments: No apparent impairments                         Following commands: Intact       Cueing       General Comments      Exercises Total Joint Exercises Ankle Circles/Pumps: AROM, Both, 10 reps   Assessment/Plan    PT Assessment Patient needs continued PT services  PT Problem List Decreased strength;Decreased range of motion;Decreased activity tolerance;Decreased balance;Decreased mobility;Decreased coordination;Pain       PT Treatment Interventions DME instruction;Gait training;Stair training;Functional mobility training;Therapeutic activities;Therapeutic exercise;Balance training;Neuromuscular re-education;Patient/family education;Modalities    PT Goals (Current goals can be found in the Care Plan section)  Acute Rehab PT Goals Patient Stated Goal: to be able to get strong enough to have the R TKA revision PT Goal Formulation: With patient Time For Goal Achievement: 12/18/24 Potential to Achieve Goals: Good    Frequency 7X/week     Co-evaluation                AM-PAC PT 6 Clicks Mobility  Outcome Measure Help needed turning from your back to your side while in a flat bed without using bedrails?: None Help needed moving from lying on your back to sitting on the side of a flat bed without using bedrails?: A Little Help needed moving to and from a bed to a chair (including a wheelchair)?: A Little Help needed standing up from a chair using your arms (e.g., wheelchair or bedside chair)?: A Little Help needed to walk in hospital room?: A Little Help needed climbing 3-5 steps with a railing? : Total 6 Click Score: 17    End of Session Equipment Utilized During Treatment: Gait belt Activity Tolerance: Patient tolerated treatment well;No increased pain Patient left: in chair;with call bell/phone within reach;with nursing/sitter in room;with chair alarm set Nurse Communication: Mobility status PT Visit Diagnosis: Unsteadiness on feet (R26.81);Other abnormalities of gait and mobility (R26.89);Muscle weakness (generalized) (M62.81);Difficulty in walking, not elsewhere classified (R26.2);Pain Pain - Right/Left: Left Pain - part of body: Knee;Leg    Time: 8362-8293 PT Time Calculation (min) (ACUTE ONLY): 29 min   Charges:   PT Evaluation $PT Eval Low Complexity: 1 Low PT Treatments $Gait Training:  8-22 mins PT General Charges $$ ACUTE PT VISIT: 1 Visit         [place], PT Acute Rehab   [place] VEAR Drone 12/04/2024, 6:55 PM

## 2024-12-04 NOTE — Transfer of Care (Signed)
 Immediate Anesthesia Transfer of Care Note  Patient: Brad Woods  Procedure(s) Performed: IRRIGATION AND DEBRIDEMENT KNEE WITH POLY EXCHANGE (Left: Knee)  Patient Location: PACU  Anesthesia Type:General  Level of Consciousness: awake, alert , and oriented  Airway & Oxygen Therapy: Patient Spontanous Breathing and Patient connected to face mask oxygen  Post-op Assessment: Report given to RN and Post -op Vital signs reviewed and stable  Post vital signs: Reviewed and stable  Last Vitals:  Vitals Value Taken Time  BP 141/104 12/04/24 13:04  Temp    Pulse 69 12/04/24 13:05  Resp 10 12/04/24 13:05  SpO2 99 % 12/04/24 13:05  Vitals shown include unfiled device data.  Last Pain:  Vitals:   12/04/24 1030  TempSrc:   PainSc: 0-No pain         Complications: No notable events documented.

## 2024-12-05 MED ORDER — OXYCODONE HCL 5 MG PO TABS: 5.0000 mg | ORAL_TABLET | Freq: Four times a day (QID) | ORAL | 0 refills | Status: DC | PRN

## 2024-12-05 MED ORDER — ASPIRIN 81 MG PO CHEW: 81.0000 mg | CHEWABLE_TABLET | Freq: Two times a day (BID) | ORAL | 0 refills | Status: AC

## 2024-12-05 MED ORDER — METHOCARBAMOL 500 MG PO TABS: 500.0000 mg | ORAL_TABLET | Freq: Four times a day (QID) | ORAL | 1 refills | Status: AC | PRN

## 2024-12-05 NOTE — Progress Notes (Signed)
 Physical Therapy Treatment Patient Details Name: Brad Woods MRN: [id number] DOB: 1946/01/26 Today's Date: 12/05/2024   History of Present Illness 78 yo male presents to therapy s/p L TKA revision-- poly exchange and I and D, due to ligamentous laxity, joint effusion and knee pain with wear on poly liner as well as failure of conservative measures. Pt PMHI includes but is not limited to: arthritis, COPD, CAD s/p stent, kidney stones, HLD, HTN, MI, R UE neuropathy, OSA, reverse R TSA and B TKA.    PT Comments  Pt is making good progress this session. Motivated to work with PT, meeting goals and is ready to d/c home from PT standpoint with family assisting prn    If plan is discharge home, recommend the following: A little help with bathing/dressing/bathroom;Assistance with cooking/housework;Assist for transportation;Help with stairs or ramp for entrance   Can travel by private vehicle        Equipment Recommendations  None recommended by PT    Recommendations for Other Services       Precautions / Restrictions Precautions Precautions: Fall;Knee Recall of Precautions/Restrictions: Intact Restrictions LLE Weight Bearing Per Provider Order: Weight bearing as tolerated     Mobility  Bed Mobility Overal bed mobility: Needs Assistance Bed Mobility: Supine to Sit     Supine to sit: Supervision, HOB elevated     General bed mobility comments: for safety, no physical assist    Transfers Overall transfer level: Needs assistance Equipment used: Rolling walker (2 wheels) Transfers: Sit to/from Stand Sit to Stand: Contact guard assist, Supervision           General transfer comment: cues for hand placement    Ambulation/Gait Ambulation/Gait assistance: Contact guard assist, Supervision Gait Distance (Feet): 100 Feet Assistive device: Rolling walker (2 wheels) Gait Pattern/deviations: Step-to pattern, Decreased step length - right, Decreased step length - left,  Decreased stance time - left Gait velocity: decreased     General Gait Details: cues for sequence and RW position. good stability, beginning L knee flexion in swing phase   Stairs             Wheelchair Mobility     Tilt Bed    Modified Rankin (Stroke Patients Only)       Balance                                            Communication Communication Communication: No apparent difficulties  Cognition Arousal: Alert Behavior During Therapy: WFL for tasks assessed/performed   PT - Cognitive impairments: No apparent impairments                         Following commands: Intact      Cueing Cueing Techniques: Verbal cues  Exercises Total Joint Exercises Ankle Circles/Pumps: AROM, Both, 10 reps Quad Sets: AROM, Strengthening, Both, 10 reps Heel Slides: AAROM, Left, 10 reps Straight Leg Raises: AROM, AAROM, Left, 10 reps    General Comments        Pertinent Vitals/Pain Pain Assessment Pain Assessment: 0-10 Pain Score: 4  Pain Location: L knee and LE Pain Descriptors / Indicators: Burning, Grimacing Pain Intervention(s): Limited activity within patient's tolerance, Monitored during session, Premedicated before session, Repositioned, Ice applied    Home Living  Prior Function            PT Goals (current goals can now be found in the care plan section) Acute Rehab PT Goals Patient Stated Goal: to be able to get strong enough to have the R TKA revision PT Goal Formulation: With patient Time For Goal Achievement: 12/18/24 Potential to Achieve Goals: Good Progress towards PT goals: Progressing toward goals    Frequency    7X/week      PT Plan      Co-evaluation              AM-PAC PT 6 Clicks Mobility   Outcome Measure  Help needed turning from your back to your side while in a flat bed without using bedrails?: None Help needed moving from lying on your back to sitting  on the side of a flat bed without using bedrails?: None Help needed moving to and from a bed to a chair (including a wheelchair)?: None Help needed standing up from a chair using your arms (e.g., wheelchair or bedside chair)?: None Help needed to walk in hospital room?: A Little Help needed climbing 3-5 steps with a railing? : A Little 6 Click Score: 22    End of Session Equipment Utilized During Treatment: Gait belt Activity Tolerance: Patient tolerated treatment well Patient left: in chair;with call bell/phone within reach;with chair alarm set Nurse Communication: Mobility status PT Visit Diagnosis: Unsteadiness on feet (R26.81);Other abnormalities of gait and mobility (R26.89);Muscle weakness (generalized) (M62.81);Difficulty in walking, not elsewhere classified (R26.2);Pain Pain - Right/Left: Left Pain - part of body: Knee;Leg     Time: 8990-8974 PT Time Calculation (min) (ACUTE ONLY): 16 min  Charges:    $Gait Training: 8-22 mins PT General Charges $$ ACUTE PT VISIT: 1 Visit                     Derwood Becraft, PT  Acute Rehab Dept [facility]) [phone]  12/05/2024    [facility] 12/05/2024, 11:10 AM

## 2024-12-05 NOTE — TOC Initial Note (Signed)
 Transition of Care [facility]) - Initial/Assessment Note    Patient Details  Name: Brad Woods MRN: [id number] Date of Birth: August 02, 1946  Transition of Care [facility]) CM/SW Contact:    Sonda Manuella Quill, RN Phone Number: 12/05/2024, 10:54 AM  Clinical Narrative:                 Spoke w/ pt in room; pt said he lives at home w/ his spouse and dtr; he plans to return w/ their support at d/c; his dtr will provide transportation; pt verified insurance/PCP; he denied SDOH risks; pt has walker and shower chair; his home health has been arranged by Dr Damian office w/ [facility]; IP CM following.  Expected Discharge Plan: Home w Home Health Services Barriers to Discharge: Continued Medical Work up   Patient Goals and CMS Choice Patient states their goals for this hospitalization and ongoing recovery are:: home          Expected Discharge Plan and Services   Discharge Planning Services: CM Consult   Living arrangements for the past 2 months: Single Family Home                 DME Arranged: N/A DME Agency: NA       HH Arranged: PT HH Agency: [facility]     Representative spoke with at [facility] Agency: Arranged by Dr Damian office  Prior Living Arrangements/Services Living arrangements for the past 2 months: Single Family Home Lives with:: Adult Children, Spouse Patient language and need for interpreter reviewed:: Yes Do you feel safe going back to the place where you live?: Yes      Need for Family Participation in Patient Care: Yes (Comment) Care giver support system in place?: Yes (comment) Current home services: DME (walker, shower chair) Criminal Activity/Legal Involvement Pertinent to Current Situation/Hospitalization: No - Comment as needed  Activities of Daily Living   ADL Screening (condition at time of admission) Independently performs ADLs?: Yes (appropriate for developmental age) Is the patient deaf or have difficulty hearing?: No Does the patient have  difficulty seeing, even when wearing glasses/contacts?: No Does the patient have difficulty concentrating, remembering, or making decisions?: No  Permission Sought/Granted Permission sought to share information with : Case Manager Permission granted to share information with : Yes, Verbal Permission Granted  Share Information with NAME: Case Manager        Permission granted to share info w Contact Information: Anthoney Sheppard (dtr) [phone]  Emotional Assessment Appearance:: Appears stated age Attitude/Demeanor/Rapport: Gracious Affect (typically observed): Accepting Orientation: : Oriented to Self, Oriented to Place, Oriented to  Time, Oriented to Situation Alcohol / Substance Use: Not Applicable Psych Involvement: No (comment)  Admission diagnosis:  Failure of total knee replacement, initial encounter [U15.981J, Z96.659] Status post revision of total replacement of left knee [Z96.652] Patient Active Problem List   Diagnosis Date Noted   Status post revision of total replacement of left knee 12/04/2024   Failed total knee, left, subsequent encounter 12/03/2024   Ulnar tunnel syndrome of right wrist 04/24/2024   Trigger finger, right little finger 04/24/2024   OA (osteoarthritis) of shoulder 10/22/2023   S/P reverse total shoulder arthroplasty, right 10/22/2023   Arthritis of right shoulder region 10/22/2023   Biceps tendonitis, right 10/22/2023   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   Morbid obesity (HCC) 07/27/2020   Dizziness 06/22/2020   Exertional dyspnea    Chronic pain of left knee 09/02/2018   Chronic pain of right knee 09/02/2018   History of  bilateral knee replacement 09/02/2018   Allergic rhinitis 08/15/2018   Erectile dysfunction 08/15/2018   History of renal stone 08/15/2018   Migraine 08/15/2018   Osteoarthritis 08/15/2018   Hyperlipidemia 08/15/2018   PVD (peripheral vascular disease) 08/15/2018   Left shoulder pain 07/30/2018   Chest pain 07/30/2018    Trochanteric bursitis, left hip 05/02/2017   Carpal tunnel syndrome, left upper limb 05/02/2017   Trochanteric bursitis, right hip 03/18/2017   Mixed hyperlipidemia 12/31/2016   Coronary artery disease involving native coronary artery of native heart with angina pectoris 11/25/2016   Coronary artery disease involving native coronary artery of native heart without angina pectoris 11/20/2016   Elevated troponin    Unstable angina (HCC) 05/18/2016   Acute coronary syndrome (HCC) 05/18/2016   Benign non-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 10/12/2014   Essential hypertension 10/12/2014   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 10/12/2014   High risk medication use 10/12/2014   IFG (impaired fasting glucose) 10/12/2014   OSA (obstructive sleep apnea) 02/04/2014   Hypothyroidism 03/02/2013   GERD (gastroesophageal reflux disease) 09/04/2012   PCP:  Nanci Senior, MD Pharmacy:   [facility] [phone] - [place], [place] - [address] [place] [address] [place] [address] [place] [address] Phone: [phone] Fax: [phone]     Social Drivers of Health (SDOH) Social History: SDOH Screenings   Food Insecurity: No Food Insecurity (12/05/2024)  Housing: Low Risk (12/05/2024)  Transportation Needs: No Transportation Needs (12/05/2024)  Utilities: Not At Risk (12/05/2024)  Social Connections: Moderately Integrated (12/04/2024)  Tobacco Use: Low Risk (12/04/2024)   SDOH Interventions: Food Insecurity Interventions: Intervention Not Indicated, Inpatient TOC Housing Interventions: Intervention Not Indicated, Inpatient TOC Transportation Interventions: Intervention Not Indicated, Inpatient TOC Utilities Interventions: Intervention Not Indicated, Inpatient TOC   Readmission Risk Interventions     No data to display

## 2024-12-05 NOTE — Plan of Care (Signed)
°  Problem: Education: Goal: Knowledge of the prescribed therapeutic regimen will improve Outcome: Progressing   Problem: Activity: Goal: Ability to avoid complications of mobility impairment will improve Outcome: Progressing   Problem: Clinical Measurements: Goal: Postoperative complications will be avoided or minimized Outcome: Progressing   Problem: Skin Integrity: Goal: Will show signs of wound healing Outcome: Progressing   Problem: Pain Management: Goal: Pain level will decrease with appropriate interventions Outcome: Progressing

## 2024-12-05 NOTE — Progress Notes (Signed)
 Subjective: 1 Day Post-Op Procedures (LRB): IRRIGATION AND DEBRIDEMENT KNEE WITH POLY EXCHANGE (Left) Patient reports pain as moderate.    Objective: Vital signs in last 24 hours: Temp:  [97.4 F (36.3 C)-98.4 F (36.9 C)] 98.3 F (36.8 C) (12/13 0759) Pulse Rate:  [57-83] 65 (12/13 0759) Resp:  [13-20] 16 (12/13 0759) BP: (116-147)/(65-104) 147/79 (12/13 0759) SpO2:  [94 %-100 %] 96 % (12/13 0918) Weight:  [129.7 kg] 129.7 kg (12/12 1514)  Intake/Output from previous day: 12/12 0701 - 12/13 0700 In: 2899 [P.O.:760; I.V.:1689; IV Piggyback:450] Out: 850 [Urine:800; Blood:50] Intake/Output this shift: Total I/O In: 462 [P.O.:240; I.V.:222] Out: 440 [Urine:440]  No results for input(s): HGB in the last 72 hours. No results for input(s): WBC, RBC, HCT, PLT in the last 72 hours. No results for input(s): NA, K, CL, CO2, BUN, CREATININE, GLUCOSE, CALCIUM  in the last 72 hours. No results for input(s): LABPT, INR in the last 72 hours.  Sensation intact distally Intact pulses distally Dorsiflexion/Plantar flexion intact Incision: dressing C/D/I Compartment soft   Assessment/Plan: 1 Day Post-Op Procedures (LRB): IRRIGATION AND DEBRIDEMENT KNEE WITH POLY EXCHANGE (Left) Up with therapy Discharge home with home health      Brad Woods 12/05/2024, 11:11 AM

## 2024-12-05 NOTE — Discharge Instructions (Signed)
 Per [facility] clinic policy, our goal is ensure optimal postoperative pain control with a multimodal pain management strategy. For all [facility] patients, our goal is to wean post-operative narcotic medications by 6 weeks post-operatively. If this is not possible due to utilization of pain medication prior to surgery, your [facility] doctor will support your acute post-operative pain control for the first 6 weeks postoperatively, with a plan to transition you back to your primary pain team following that. Brad Woods will work to ensure a Therapist, occupational.  INSTRUCTIONS AFTER JOINT REPLACEMENT   Remove items at home which could result in a fall. This includes throw rugs or furniture in walking pathways ICE to the affected joint every three hours while awake for 30 minutes at a time, for at least the first 3-5 days, and then as needed for pain and swelling.  Continue to use ice for pain and swelling. You may notice swelling that will progress down to the foot and ankle.  This is normal after surgery.  Elevate your leg when you are not up walking on it.   Continue to use the breathing machine you got in the hospital (incentive spirometer) which will help keep your temperature down.  It is common for your temperature to cycle up and down following surgery, especially at night when you are not up moving around and exerting yourself.  The breathing machine keeps your lungs expanded and your temperature down.   DIET:  As you were doing prior to hospitalization, we recommend a well-balanced diet.  DRESSING / WOUND CARE / SHOWERING  Keep the surgical dressing until follow up.  The dressing is water  proof, so you can shower without any extra covering.  IF THE DRESSING FALLS OFF or the wound gets wet inside, change the dressing with sterile gauze.  Please use good hand washing techniques before changing the dressing.  Do not use any lotions or creams on the incision until instructed by your surgeon.     ACTIVITY  Increase activity slowly as tolerated, but follow the weight bearing instructions below.   No driving for 6 weeks or until further direction given by your physician.  You cannot drive while taking narcotics.  No lifting or carrying greater than 10 lbs. until further directed by your surgeon. Avoid periods of inactivity such as sitting longer than an hour when not asleep. This helps prevent blood clots.  You may return to work once you are authorized by your doctor.     WEIGHT BEARING   Weight bearing as tolerated with assist device (walker, cane, etc) as directed, use it as long as suggested by your surgeon or therapist, typically at least 4-6 weeks.   EXERCISES  Results after joint replacement surgery are often greatly improved when you follow the exercise, range of motion and muscle strengthening exercises prescribed by your doctor. Safety measures are also important to protect the joint from further injury. Any time any of these exercises cause you to have increased pain or swelling, decrease what you are doing until you are comfortable again and then slowly increase them. If you have problems or questions, call your caregiver or physical therapist for advice.   Rehabilitation is important following a joint replacement. After just a few days of immobilization, the muscles of the leg can become weakened and shrink (atrophy).  These exercises are designed to build up the tone and strength of the thigh and leg muscles and to improve motion. Often times heat used for twenty to thirty minutes before  working out will loosen up your tissues and help with improving the range of motion but do not use heat for the first two weeks following surgery (sometimes heat can increase post-operative swelling).   These exercises can be done on a training (exercise) mat, on the floor, on a table or on a bed. Use whatever works the best and is most comfortable for you.    Use music or television  while you are exercising so that the exercises are a pleasant break in your day. This will make your life better with the exercises acting as a break in your routine that you can look forward to.   Perform all exercises about fifteen times, three times per day or as directed.  You should exercise both the operative leg and the other leg as well.  Exercises include:   Quad Sets - Tighten up the muscle on the front of the thigh (Quad) and hold for 5-10 seconds.   Straight Leg Raises - With your knee straight (if you were given a brace, keep it on), lift the leg to 60 degrees, hold for 3 seconds, and slowly lower the leg.  Perform this exercise against resistance later as your leg gets stronger.  Leg Slides: Lying on your back, slowly slide your foot toward your buttocks, bending your knee up off the floor (only go as far as is comfortable). Then slowly slide your foot back down until your leg is flat on the floor again.  Angel Wings: Lying on your back spread your legs to the side as far apart as you can without causing discomfort.  Hamstring Strength:  Lying on your back, push your heel against the floor with your leg straight by tightening up the muscles of your buttocks.  Repeat, but this time bend your knee to a comfortable angle, and push your heel against the floor.  You may put a pillow under the heel to make it more comfortable if necessary.   A rehabilitation program following joint replacement surgery can speed recovery and prevent re-injury in the future due to weakened muscles. Contact your doctor or a physical therapist for more information on knee rehabilitation.    CONSTIPATION  Constipation is defined medically as fewer than three stools per week and severe constipation as less than one stool per week.  Even if you have a regular bowel pattern at home, your normal regimen is likely to be disrupted due to multiple reasons following surgery.  Combination of anesthesia, postoperative  narcotics, change in appetite and fluid intake all can affect your bowels.   YOU MUST use at least one of the following options; they are listed in order of increasing strength to get the job done.  They are all available over the counter, and you may need to use some, POSSIBLY even all of these options:    Drink plenty of fluids (prune juice may be helpful) and high fiber foods Colace 100 mg by mouth twice a day  Senokot for constipation as directed and as needed Dulcolax (bisacodyl), take with full glass of water  Miralax (polyethylene glycol) once or twice a day as needed.  If you have tried all these things and are unable to have a bowel movement in the first 3-4 days after surgery call either your surgeon or your primary doctor.    If you experience loose stools or diarrhea, hold the medications until you stool forms back up.  If your symptoms do not get better within 1 week  or if they get worse, check with your doctor.  If you experience the worst abdominal pain ever or develop nausea or vomiting, please contact the office immediately for further recommendations for treatment.   ITCHING:  If you experience itching with your medications, try taking only a single pain pill, or even half a pain pill at a time.  You can also use Benadryl  over the counter for itching or also to help with sleep.   TED HOSE STOCKINGS:  Use stockings on both legs until for at least 2 weeks or as directed by physician office. They may be removed at night for sleeping.  MEDICATIONS:  See your medication summary on the "After Visit Summary" that nursing will review with you.  You may have some home medications which will be placed on hold until you complete the course of blood thinner medication.  It is important for you to complete the blood thinner medication as prescribed.  PRECAUTIONS:  If you experience chest pain or shortness of breath - call 911 immediately for transfer to the hospital emergency department.    If you develop a fever greater that 101 F, purulent drainage from wound, increased redness or drainage from wound, foul odor from the wound/dressing, or calf pain - CONTACT YOUR SURGEON.                                                   FOLLOW-UP APPOINTMENTS:  If you do not already have a post-op appointment, please call the office for an appointment to be seen by your surgeon.  Guidelines for how soon to be seen are listed in your "After Visit Summary", but are typically between 1-4 weeks after surgery.  OTHER INSTRUCTIONS:   Knee Replacement:  Do not place pillow under knee, focus on keeping the knee straight while resting. CPM instructions: 0-90 degrees, 2 hours in the morning, 2 hours in the afternoon, and 2 hours in the evening. Place foam block, curve side up under heel at all times except when in CPM or when walking.  DO NOT modify, tear, cut, or change the foam block in any way.  POST-OPERATIVE OPIOID TAPER INSTRUCTIONS: It is important to wean off of your opioid medication as soon as possible. If you do not need pain medication after your surgery it is ok to stop day one. Opioids include: Codeine, Hydrocodone(Norco, Vicodin), Oxycodone (Percocet, oxycontin ) and hydromorphone  amongst others.  Long term and even short term use of opiods can cause: Increased pain response Dependence Constipation Depression Respiratory depression And more.  Withdrawal symptoms can include Flu like symptoms Nausea, vomiting And more Techniques to manage these symptoms Hydrate well Eat regular healthy meals Stay active Use relaxation techniques(deep breathing, meditating, yoga) Do Not substitute Alcohol to help with tapering If you have been on opioids for less than two weeks and do not have pain than it is ok to stop all together.  Plan to wean off of opioids This plan should start within one week post op of your joint replacement. Maintain the same interval or time between taking each dose  and first decrease the dose.  Cut the total daily intake of opioids by one tablet each day Next start to increase the time between doses. The last dose that should be eliminated is the evening dose.   MAKE SURE YOU:  Understand these instructions.  Get help right away if you are not doing well or get worse.    Thank you for letting us  be a part of your medical care team.  It is a privilege we respect greatly.  We hope these instructions will help you stay on track for a fast and full recovery!     Dental Antibiotics:  In most cases prophylactic antibiotics for Dental procdeures after total joint surgery are not necessary.  Exceptions are as follows:  1. History of prior total joint infection  2. Severely immunocompromised (Organ Transplant, cancer chemotherapy, Rheumatoid biologic meds such as Humera)  3. Poorly controlled diabetes (A1C &gt; 8.0, blood glucose over 200)  If you have one of these conditions, contact your surgeon for an antibiotic prescription, prior to your dental procedure.

## 2024-12-05 NOTE — TOC Transition Note (Signed)
 Transition of Care [facility]) - Discharge Note   Patient Details  Name: Brad Woods MRN: [id number] Date of Birth: Mar 04, 1946  [facility] CM/SW Contact:  Sonda Manuella Quill, RN Phone Number: 12/05/2024, 11:43 AM   Clinical Narrative:    D/C orders received; no IP CM needs.   Final next level of care: Home w Home Health Services Barriers to Discharge: No Barriers Identified   Patient Goals and CMS Choice Patient states their goals for this hospitalization and ongoing recovery are:: home          Discharge Placement                       Discharge Plan and Services Additional resources added to the After Visit Summary for     Discharge Planning Services: CM Consult            DME Arranged: N/A DME Agency: NA       HH Arranged: PT HH Agency: [facility]     Representative spoke with at [facility] Agency: Arranged by Dr Damian office  Social Drivers of Health (SDOH) Interventions SDOH Screenings   Food Insecurity: No Food Insecurity (12/05/2024)  Housing: Low Risk (12/05/2024)  Transportation Needs: No Transportation Needs (12/05/2024)  Utilities: Not At Risk (12/05/2024)  Social Connections: Moderately Integrated (12/04/2024)  Tobacco Use: Low Risk (12/04/2024)     Readmission Risk Interventions     No data to display

## 2024-12-05 NOTE — Plan of Care (Signed)
  Problem: Pain Managment: Goal: General experience of comfort will improve and/or be controlled Outcome: Progressing   Problem: Activity: Goal: Ability to avoid complications of mobility impairment will improve Outcome: Progressing Goal: Range of joint motion will improve Outcome: Progressing   Problem: Clinical Measurements: Goal: Postoperative complications will be avoided or minimized Outcome: Progressing   Problem: Pain Management: Goal: Pain level will decrease with appropriate interventions Outcome: Progressing

## 2024-12-05 NOTE — Discharge Summary (Signed)
 Patient ID: Brad Woods MRN: [id number] DOB/AGE: 1945/12/31 78 y.o.  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 male who was admitted 12/04/2024 for operative treatment ofFailed total knee, left, subsequent encounter. Patient has severe unremitting pain that affects sleep, daily activities, and work/hobbies. After pre-op clearance the patient was taken to the operating room on 12/04/2024 and underwent  Procedures: IRRIGATION AND DEBRIDEMENT KNEE WITH POLY EXCHANGE.    Patient was given perioperative antibiotics:  Anti-infectives (From admission, onward)    Start     Dose/Rate Route Frequency Ordered Stop   12/04/24 1800  ceFAZolin  (ANCEF ) IVPB 2g/100 mL premix        2 g 200 mL/hr over 30 Minutes Intravenous Every 6 hours 12/04/24 1515 12/05/24 0034   12/04/24 0900  ceFAZolin  (ANCEF ) IVPB 3g/150 mL premix        3 g 300 mL/hr over 30 Minutes Intravenous On call to O.R. 12/04/24 0847 12/04/24 1145        Patient was given sequential compression devices,  early ambulation, and chemoprophylaxis to prevent DVT.  Inpatient Morphine  Milligram Equivalents Per Day 12/12 - 12/13   Values displayed are in units of MME/Day    Order Start / End Date Yesterday Today    oxyCODONE  (Oxy IR/ROXICODONE ) immediate release tablet 5 mg 12/12 - 12/12 7.5 of Unknown --    oxyCODONE  (ROXICODONE ) 5 MG/5ML solution 5 mg 12/12 - 12/12 0 of Unknown --      Group total: 7.5 of Unknown     fentaNYL  (SUBLIMAZE ) injection 25-100 mcg 12/12 - 12/12 15 of 7.5-30 --    fentaNYL  (SUBLIMAZE ) 50 MCG/ML injection 12/12 - 12/12 0 of Unknown --    fentaNYL  (SUBLIMAZE ) injection 25-50 mcg 12/12 - 12/12 45 of 45-90 --    fentaNYL  citrate (PF) (SUBLIMAZE ) injection 12/12 - 12/12 *60 of 60 --    oxyCODONE  (Oxy IR/ROXICODONE ) immediate release tablet 5-10 mg 12/12 - No end date 15 of 22.5-45 15 of 45-90    oxyCODONE  (Oxy IR/ROXICODONE ) immediate release tablet 10-15 mg 12/12 - No end date 0 of 45-67.5 0 of 90-135    morphine  (PF) 2 MG/ML injection 2-4 mg 12/12 - No end date 0 of 18-36 0 of 48-96    Daily Totals  * 142.5 of Unknown (at least 198-328.5) 15 of 183-321  *One-Step medication  Calculation Errors     Order Type Date Details  fentaNYL  (SUBLIMAZE ) 50 MCG/ML injection Ordered Dose -- Frequency type could not be determined   oxyCODONE  (Oxy IR/ROXICODONE ) immediate release tablet 5 mg Ordered Dose -- Insufficient frequency information   oxyCODONE  (ROXICODONE ) 5 MG/5ML solution 5 mg Ordered Dose -- Insufficient frequency information            Patient benefited maximally from hospital stay and there were no complications.    Recent vital signs: Patient Vitals for the past 24 hrs:  BP Temp Temp src Pulse Resp SpO2 Height Weight  12/05/24 0918 -- -- -- -- -- 96 % -- --  12/05/24 0759 (!) 147/79 98.3 F (36.8 C) Oral 65 16 96 % -- --  12/05/24 0621 139/72 (!) 97.4 F (36.3 C) Oral 65 16 96 % -- --  12/05/24 0200 122/65 97.7 F (36.5 C) Oral 74 15 95 % -- --  12/04/24  2119 -- -- -- -- -- 98 % -- --  12/04/24 2101 133/70 97.8 F (36.6 C) Oral 83 15 97 % -- --  12/04/24 1723 117/68 98.4 F (36.9 C) -- 82 16 94 % -- --  12/04/24 1516 -- -- -- -- -- 99 % -- --  12/04/24 1514 116/70 (!) 97.5 F (36.4 C) Oral 63 18 99 % 6' (1.829 m) 129.7 kg  12/04/24 1400 133/72 -- -- (!) 57 13 98 % -- --  12/04/24 1345 138/78 -- -- (!) 58 13 99 % -- --  12/04/24 1330 (!) 144/67 -- -- 62 18 100 % -- --  12/04/24 1315 130/68 -- -- 64 13 100 % -- --  12/04/24 1303 (!) 141/104 97.8 F (36.6 C) -- 69 20 99 % -- --     Recent laboratory studies: No results for input(s): WBC, HGB, HCT, PLT, NA, K, CL, CO2, BUN, CREATININE, GLUCOSE, INR, CALCIUM  in the last 72 hours.  Invalid input(s): PT, 2   Discharge Medications:   Allergies as of 12/05/2024       Reactions   Hydromorphone Hcl Other (See Comments)   flatlined - Patient states he was overdosed. He states he isn't allergic.   Hydromorphone    Cardiac arrest   Aleve [naproxen Sodium] Dermatitis   States he was given aleve in the hospital and it caused welts.         Medication List     TAKE these medications    albuterol  108 (90 Base) MCG/ACT inhaler Commonly known as: VENTOLIN  HFA Inhale 2 puffs into the lungs every 6 (six) hours as needed for wheezing or shortness of breath.   aspirin  81 MG chewable tablet Chew 1 tablet (81 mg total) by mouth 2 (two) times daily.   carvedilol  12.5 MG tablet Commonly known as: COREG  Take 1 tablet (12.5 mg total) by mouth 2 (two) times daily.   clopidogrel  75 MG tablet Commonly known as: PLAVIX  TAKE 1 TABLET BY MOUTH EVERY DAY   finasteride  5 MG tablet Commonly known as: PROSCAR  Take 5 mg by mouth daily after supper.   fluticasone  50 MCG/ACT nasal spray Commonly known as: FLONASE  Place 2 sprays into the nose daily as needed for allergies.   ibuprofen 200 MG tablet Commonly known as: ADVIL Take 400 mg by mouth every 8 (eight)  hours as needed (pain.).   isosorbide  mononitrate 60 MG 24 hr tablet Commonly known as: IMDUR  Take 1 tablet (60 mg total) by mouth daily.   methocarbamol  500 MG tablet Commonly known as: ROBAXIN  Take 1 tablet (500 mg total) by mouth  every 6 (six) hours as needed for muscle spasms.   multivitamin with minerals Tabs tablet Take 1 tablet by mouth in the morning.   nitroGLYCERIN  0.4 MG SL tablet Commonly known as: NITROSTAT  Dissolve 1 tablet under the tongue every 5 minutes as needed for chest pain. Max of 3 doses, then 911.   oxyCODONE  5 MG immediate release tablet Commonly known as: Oxy IR/ROXICODONE  Take 1-2 tablets (5-10 mg total) by mouth every 6 (six) hours as needed for moderate pain (pain score 4-6) (pain score 4-6).   Repatha  SureClick 140 MG/ML Soaj Generic drug: Evolocumab  Inject 140 mg into the skin every 14 (fourteen) days.   tamsulosin  0.4 MG Caps capsule Commonly known as: FLOMAX  Take 0.4 mg by mouth daily after supper.   Trelegy Ellipta  200-62.5-25 MCG/ACT Aepb Generic drug: Fluticasone -Umeclidin-Vilant Inhale 1 puff into the lungs daily.               Durable Medical Equipment  (From admission, onward)           Start     Ordered   12/04/24 1516  DME 3 n 1  Once        12/04/24 1515   12/04/24 1516  DME Walker rolling  Once       Question Answer Comment  Walker: With 5 Inch Wheels   Patient needs a walker to treat with the following condition Status post revision of total knee replacement, left      12/04/24 1515            Diagnostic Studies: DG Knee Left Port Result Date: 12/04/2024 CLINICAL DATA:  Knee replacement EXAM: DG KNEE 1-2V PORT*L* COMPARISON:  10/29/2024 FINDINGS: Knee replacement with intact hardware and normal alignment. Gas within the soft tissues consistent with recent surgery. Cutaneous staples are noted IMPRESSION: Knee replacement with expected postsurgical change. Electronically Signed   By: Luke Bun M.D.   On:  12/04/2024 21:19   ECHOCARDIOGRAM COMPLETE Result Date: 11/05/2024    ECHOCARDIOGRAM REPORT   Patient Name:   NYAL SCHACHTER Date of Exam: 11/05/2024 Medical Rec #:  [id number]        Height:       71.9 in Accession #:    [id number]       Weight:       292.6 lb Date of Birth:  21-Apr-1946         BSA:          2.501 m Patient Age:    78 years         BP:           115/62 mmHg Patient Gender: M                HR:           71 bpm. Exam Location:  [address] Procedure: 2D Echo, Cardiac Doppler, Color Doppler and Intracardiac            Opacification Agent (Both Spectral and Color Flow Doppler were            utilized during procedure). Indications:    DOE (dyspnea on exertion) [R06.09 (ICD-10-CM)]  History:        Patient has prior history of Echocardiogram examinations, most                 recent 08/11/2020.  Sonographer:    Rosaline Fujisawa MHA, RDMS, RVT, RDCS Referring Phys: [id number] EMELINE FORBES CALENDER  Sonographer Comments: Technically challenging study  due to limited acoustic windows, suboptimal apical window and patient is obese. Image acquisition challenging due to patient body habitus and Image acquisition challenging due to respiratory motion. IMPRESSIONS  1. Left ventricular ejection fraction, by estimation, is 60 to 65%. The left ventricle has normal function. The left ventricle has no regional wall motion abnormalities. There is mild left ventricular hypertrophy. Left ventricular diastolic parameters are consistent with Grade I diastolic dysfunction (impaired relaxation).  2. Right ventricular systolic function is normal. The right ventricular size is normal.  3. The mitral valve is normal in structure. Trivial mitral valve regurgitation.  4. The aortic valve is tricuspid. Aortic valve regurgitation is trivial. No aortic stenosis is present.  5. The inferior vena cava is normal in size with greater than 50% respiratory variability, suggesting right atrial pressure of 3 mmHg. FINDINGS  Left Ventricle:  Left ventricular ejection fraction, by estimation, is 60 to 65%. The left ventricle has normal function. The left ventricle has no regional wall motion abnormalities. Definity  contrast agent was given IV to delineate the left ventricular  endocardial borders. The left ventricular internal cavity size was normal in size. There is mild left ventricular hypertrophy. Left ventricular diastolic parameters are consistent with Grade I diastolic dysfunction (impaired relaxation). Right Ventricle: The right ventricular size is normal. No increase in right ventricular wall thickness. Right ventricular systolic function is normal. Left Atrium: Left atrial size was normal in size. Right Atrium: Right atrial size was normal in size. Pericardium: There is no evidence of pericardial effusion. Mitral Valve: The mitral valve is normal in structure. Trivial mitral valve regurgitation. Tricuspid Valve: The tricuspid valve is normal in structure. Tricuspid valve regurgitation is trivial. Aortic Valve: The aortic valve is tricuspid. Aortic valve regurgitation is trivial. Aortic regurgitation PHT measures 821 msec. No aortic stenosis is present. Aortic valve mean gradient measures 2.0 mmHg. Aortic valve peak gradient measures 3.8 mmHg. Aortic valve area, by VTI measures 2.99 cm. Pulmonic Valve: The pulmonic valve was not well visualized. Pulmonic valve regurgitation is trivial. Aorta: The aortic root and ascending aorta are structurally normal, with no evidence of dilitation. Venous: The inferior vena cava is normal in size with greater than 50% respiratory variability, suggesting right atrial pressure of 3 mmHg. IAS/Shunts: The atrial septum is grossly normal.  LEFT VENTRICLE PLAX 2D LVIDd:         4.31 cm   Diastology LVIDs:         2.70 cm   LV e' medial:    5.66 cm/s LV PW:         1.29 cm   LV E/e' medial:  8.5 LV IVS:        0.89 cm   LV e' lateral:   7.94 cm/s LVOT diam:     2.23 cm   LV E/e' lateral: 6.1 LV SV:         68 LV SV  Index:   27 LVOT Area:     3.91 cm  RIGHT VENTRICLE RV Basal diam:  2.71 cm RV Mid diam:    2.62 cm RV S prime:     16.20 cm/s TAPSE (M-mode): 2.8 cm LEFT ATRIUM           Index        RIGHT ATRIUM           Index LA diam:      3.96 cm 1.58 cm/m   RA Area:     12.20 cm LA Vol (A4C): 57.3 ml  22.92 ml/m  RA Volume:   22.20 ml  8.88 ml/m  AORTIC VALVE AV Area (Vmax):    3.44 cm AV Area (Vmean):   3.38 cm AV Area (VTI):     2.99 cm AV Vmax:           97.40 cm/s AV Vmean:          66.500 cm/s AV VTI:            0.226 m AV Peak Grad:      3.8 mmHg AV Mean Grad:      2.0 mmHg LVOT Vmax:         85.70 cm/s LVOT Vmean:        57.500 cm/s LVOT VTI:          0.173 m LVOT/AV VTI ratio: 0.77 AI PHT:            821 msec  AORTA Ao Root diam: 3.10 cm Ao Asc diam:  3.44 cm MITRAL VALVE MV Area (PHT): 3.27 cm     SHUNTS MV Decel Time: 232 msec     Systemic VTI:  0.17 m MR Peak grad: 4.7 mmHg      Systemic Diam: 2.23 cm MR Vmax:      108.00 cm/s MV E velocity: 48.10 cm/s MV A velocity: 101.00 cm/s MV E/A ratio:  0.48 Lonni Nanas MD Electronically signed by Lonni Nanas MD Signature Date/Time: 11/05/2024/2:36:54 PM    Final     Disposition: Discharge disposition: 01-Home or Self Care          Follow-up Information     Vernetta Lonni GRADE, MD Follow up in 2 week(s).   Specialty: Orthopedic Surgery Contact information: [address] [place]  [place] [address] [phone]         [place], [facility] Follow up.   Specialty: Home Health Services Why: Agency will provide Home Health Physical Therapy Contact information: [address] [place] [address] [phone]                  Signed: Lonni GRADE Vernetta 12/05/2024, 11:12 AM

## 2024-12-05 NOTE — Progress Notes (Signed)
 Provided discharge education/instructions, all questions answered. Pt is alert and oriented x 4. Pt is not in any distress, discharged home with all of his belongings accompanied by his family.

## 2024-12-07 ENCOUNTER — Encounter (HOSPITAL_COMMUNITY): Payer: Self-pay | Admitting: Orthopaedic Surgery

## 2024-12-08 ENCOUNTER — Ambulatory Visit: Payer: Self-pay | Admitting: Pulmonary Disease

## 2024-12-08 NOTE — Procedures (Signed)
 [facility] [address] [place], [place] [address] Tel: [phone]   Fax: [phone]  Titration Interpretation  Patient Name:  Brad Woods, Brad Woods Date:  11/29/2024 Referring Physician:  ANNELLA DONNICE SAUNDERS, MD %%startinterp%% Indications for Polysomnography The patient is a 78 year old Male who is 6' and weighs 286.0 lbs. His BMI equals 39.2.  A full night titration treatment study was performed. HST 11/2023 mixed obstructive & central apneas  Polysomnogram Data A full night polysomnogram recorded the standard physiologic parameters including EEG, EOG, EMG, EKG, nasal and oral airflow.  Respiratory parameters of chest and abdominal movements were recorded with Respiratory Inductance Plethysmography belts.  Oxygen saturation was recorded by pulse oximetry.   Sleep Architecture The total recording time of the polysomnogram was 437.7 minutes.  The total sleep time was 209.5 minutes.  The patient spent 12.9% of total sleep time in Stage N1, 70.6% in Stage N2, 9.3% in Stages N3, and 7.2% in REM.  Sleep latency was 21.4 minutes.  REM latency was 140.0 minutes.  Sleep Efficiency was 47.9%.  Wake after Sleep Onset time was 206.5 minutes.  Titration Summary The patient was titrated at pressures ranging from 8/4/0* cm/H20 with supplemental oxygen at - up to 26/20/11* cm/H20 with supplemental oxygen at -.  The last pressure used in the study was 26/20/11* cm/H20 with supplemental oxygen at -.  Respiratory Events The polysomnogram revealed a presence of 5 obstructive, 8 central, and 16 mixed apneas resulting in an Apnea index of 8.3 events per hour.  There were 142 hypopneas (>=3% desaturation and/or arousal) resulting in an Apnea\Hypopnea Index (AHI >=3% desaturation and/or arousal) of 49.0 events per hour.  There were 130 hypopneas (>=4% desaturation) resulting in an Apnea\Hypopnea Index (AHI >=4% desaturation) of 45.5 events per hour.  There were 18  Respiratory Effort Related Arousals resulting in a RERA index of 5.2 events per hour. The Respiratory Disturbance Index is 54.1 events per hour.  The snore index was 49.5 events per hour.  Mean oxygen saturation was 93.7%.  The lowest oxygen saturation during sleep was 76.0%.  Time spent <=88% oxygen saturation was 49.5 minutes (11.5%).  Limb Activity There were - limb movements recorded.  Of this total, - were classified as PLMs.  Of the PLMs, - were associated with arousals.  The Limb Movement index was - per hour while the PLM index was - per hour.  Cardiac Summary The average pulse rate was 55.0 bpm.  The minimum pulse rate was 47.0 bpm while the maximum pulse rate was 71.0 bpm.  Cardiac rhythm was normal/abnormal.  Comments: The patient was fitted with a mask and trialed BPAP at a pressure of 8/4 cm H2O prior to the start of the study.           BPAP was started at a pressure of 8/4 cm H2O and was increased to a pressure of 26/20 cm H2O for obstructive and mixed events. The patient tolerated the pressures well. The backup rate was set to 11. The patient used a large F&P Simplus full-face mask during the titration.           The patient slept in the supine position.  Diagnosis: Mixed obstructive & central sleep apnea -corrected by Bilevel with back up rate , optimal was 23/17  Recommendations: Initiate auto bilevel with EPAP 10cm , PS +6 , IPAP max 26 with back up rate of 11 Monitor compliance download & residual events at this level If persistent events, he  may need ASV titration study Consider referral to sleep physician    This study was personally reviewed and electronically signed by: JUDE HARDEN GAILS, MD Accredited Board Certified in Sleep Medicine

## 2024-12-09 ENCOUNTER — Telehealth: Payer: Self-pay | Admitting: Orthopaedic Surgery

## 2024-12-09 NOTE — Telephone Encounter (Signed)
 Pt daughter called and said that the ice pack machine that he was given at the hospital, one of the hose broke off and now it just leaks. She don't know who to tell that too but he can't use it. CB#[phone]

## 2024-12-09 NOTE — Telephone Encounter (Signed)
Sent message to Ryan/Donjoy

## 2024-12-10 NOTE — Anesthesia Postprocedure Evaluation (Signed)
 Anesthesia Post Note  Patient: Brad Woods  Procedure(s) Performed: IRRIGATION AND DEBRIDEMENT KNEE WITH POLY EXCHANGE (Left: Knee)     Patient location during evaluation: PACU Anesthesia Type: General Level of consciousness: awake and alert Pain management: pain level controlled Vital Signs Assessment: post-procedure vital signs reviewed and stable Respiratory status: spontaneous breathing, nonlabored ventilation, respiratory function stable and patient connected to nasal cannula oxygen Cardiovascular status: blood pressure returned to baseline and stable Postop Assessment: no apparent nausea or vomiting Anesthetic complications: no   No notable events documented.  Last Vitals:  Vitals:   12/05/24 0918 12/05/24 1257  BP:  131/65  Pulse:  71  Resp:  17  Temp:  36.4 C  SpO2: 96% 96%    Last Pain:  Vitals:   12/05/24 1257  TempSrc: Oral  PainSc: 6                  [name]

## 2024-12-10 NOTE — Progress Notes (Signed)
 BiPAP order placed. NFN

## 2024-12-12 ENCOUNTER — Other Ambulatory Visit: Payer: Self-pay | Admitting: Orthopaedic Surgery

## 2024-12-14 ENCOUNTER — Other Ambulatory Visit: Payer: Self-pay | Admitting: Orthopaedic Surgery

## 2024-12-14 ENCOUNTER — Telehealth: Payer: Self-pay | Admitting: Orthopaedic Surgery

## 2024-12-14 MED ORDER — OXYCODONE HCL 5 MG PO TABS: 5.0000 mg | ORAL_TABLET | Freq: Four times a day (QID) | ORAL | 0 refills | Status: AC | PRN

## 2024-12-14 NOTE — Telephone Encounter (Signed)
 Pt called saying that he needs 1/2 a prescription for his Oxycodone . Pharmacy is [facility] in [facility]. Call back number is [phone]

## 2024-12-16 ENCOUNTER — Ambulatory Visit: Admitting: Orthopaedic Surgery

## 2024-12-16 ENCOUNTER — Encounter: Payer: Self-pay | Admitting: Orthopaedic Surgery

## 2024-12-16 DIAGNOSIS — Z96652 Presence of left artificial knee joint: Secondary | ICD-10-CM

## 2024-12-16 NOTE — Progress Notes (Signed)
 The patient is her first first postoperative visit status post a polyliner exchange in his left knee.  He reports good range of motion of that knee and just some moderate pain.  He denies any calf pain.  He has been compliant with a baby aspirin  twice daily.  He has had a home therapy and is transitioning to a home exercise program.  Intraoperatively he did have significant poly wear of his knee.  That knee was originally replaced elsewhere in 2014.  We were able to change out the poly and upsize the poly and found the metal components to be intact.  On exam today the incision looks good.  Staples are removed and Steris is applied.  His calf is soft and his range of motion is full.  There is swelling to be expected.  He will continue to increase his activities as comfort allows.  If he does run low pain medication and needs more he knows to contact us .  We will see him back in a month just to see how he is doing from a mobility standpoint but no x-rays are needed.

## 2024-12-31 ENCOUNTER — Telehealth: Payer: Self-pay

## 2024-12-31 NOTE — Telephone Encounter (Signed)
 Requested paperwork faxed to [facility]

## 2025-01-18 ENCOUNTER — Encounter: Admitting: Orthopaedic Surgery

## 2025-04-05 ENCOUNTER — Ambulatory Visit: Admitting: Orthopedic Surgery
